# Patient Record
Sex: Male | Born: 1965 | Race: White | Hispanic: No | Marital: Married | Smoking: Never smoker
Health system: Southern US, Community
[De-identification: ages and names within clinical notes are randomized; demographics above are authoritative.]

## PROBLEM LIST (undated history)

## (undated) DIAGNOSIS — G4730 Sleep apnea, unspecified: Secondary | ICD-10-CM

## (undated) DIAGNOSIS — I10 Essential (primary) hypertension: Secondary | ICD-10-CM

## (undated) DIAGNOSIS — J45909 Unspecified asthma, uncomplicated: Secondary | ICD-10-CM

## (undated) DIAGNOSIS — I639 Cerebral infarction, unspecified: Secondary | ICD-10-CM

## (undated) DIAGNOSIS — G4733 Obstructive sleep apnea (adult) (pediatric): Secondary | ICD-10-CM

## (undated) DIAGNOSIS — R011 Cardiac murmur, unspecified: Secondary | ICD-10-CM

## (undated) HISTORY — DX: Unspecified asthma, uncomplicated: J45.909

## (undated) HISTORY — PX: OTHER SURGICAL HISTORY: SHX169

## (undated) HISTORY — DX: Sleep apnea, unspecified: G47.30

## (undated) HISTORY — DX: Essential (primary) hypertension: I10

## (undated) HISTORY — DX: Obstructive sleep apnea (adult) (pediatric): G47.33

## (undated) HISTORY — DX: Cardiac murmur, unspecified: R01.1

## (undated) HISTORY — DX: Cerebral infarction, unspecified: I63.9

---

## 2014-04-06 ENCOUNTER — Telehealth (HOSPITAL_COMMUNITY): Payer: Self-pay | Admitting: *Deleted

## 2014-05-18 ENCOUNTER — Telehealth (HOSPITAL_COMMUNITY): Payer: Self-pay | Admitting: *Deleted

## 2015-11-05 ENCOUNTER — Ambulatory Visit (INDEPENDENT_AMBULATORY_CARE_PROVIDER_SITE_OTHER): Payer: BLUE CROSS/BLUE SHIELD | Admitting: Family Medicine

## 2015-11-05 VITALS — BP 122/70 | HR 91 | Temp 99.0°F | Resp 16 | Ht 70.0 in | Wt 307.0 lb

## 2015-11-05 DIAGNOSIS — J45901 Unspecified asthma with (acute) exacerbation: Secondary | ICD-10-CM | POA: Diagnosis not present

## 2015-11-05 DIAGNOSIS — H66002 Acute suppurative otitis media without spontaneous rupture of ear drum, left ear: Secondary | ICD-10-CM

## 2015-11-05 MED ORDER — IPRATROPIUM BROMIDE 0.02 % IN SOLN
0.5000 mg | Freq: Once | RESPIRATORY_TRACT | Status: AC
  Administered 2015-11-05: 0.5 mg via RESPIRATORY_TRACT

## 2015-11-05 MED ORDER — HYDROCOD POLST-CPM POLST ER 10-8 MG/5ML PO SUER: 5.0000 mL | Freq: Two times a day (BID) | ORAL | Status: DC | PRN

## 2015-11-05 MED ORDER — ALBUTEROL SULFATE (2.5 MG/3ML) 0.083% IN NEBU
2.5000 mg | INHALATION_SOLUTION | Freq: Once | RESPIRATORY_TRACT | Status: AC
  Administered 2015-11-05: 2.5 mg via RESPIRATORY_TRACT

## 2015-11-05 MED ORDER — IPRATROPIUM-ALBUTEROL 20-100 MCG/ACT IN AERS: 1.0000 | INHALATION_SPRAY | Freq: Four times a day (QID) | RESPIRATORY_TRACT | Status: DC

## 2015-11-05 MED ORDER — AMOXICILLIN-POT CLAVULANATE 875-125 MG PO TABS
1.0000 | ORAL_TABLET | Freq: Two times a day (BID) | ORAL | Status: DC
Start: 2015-11-05 — End: 2016-09-07

## 2015-11-05 NOTE — Patient Instructions (Signed)
Hot showers or breathing in steam may help loosen the congestion.  Using a netti pot or sinus rinse is also likely to help you feel better and keep this from progressing.  Use the nasal saline spray as needed throughout the day for at least 2 weeks.  I recommend augmenting with generic mucinex to help you move out the congestion.  If no improvement or you are getting worse, come back as you might need a course of steroids but hopefully with all of the above, you can avoid it.  Bronchospasm, Adult A bronchospasm is a spasm or tightening of the airways going into the lungs. During a bronchospasm breathing becomes more difficult because the airways get smaller. When this happens there can be coughing, a whistling sound when breathing (wheezing), and difficulty breathing. Bronchospasm is often associated with asthma, but not all patients who experience a bronchospasm have asthma. CAUSES  A bronchospasm is caused by inflammation or irritation of the airways. The inflammation or irritation may be triggered by:   Allergies (such as to animals, pollen, food, or mold). Allergens that cause bronchospasm may cause wheezing immediately after exposure or many hours later.   Infection. Viral infections are believed to be the most common cause of bronchospasm.   Exercise.   Irritants (such as pollution, cigarette smoke, strong odors, aerosol sprays, and paint fumes).   Weather changes. Winds increase molds and pollens in the air. Rain refreshes the air by washing irritants out. Cold air may cause inflammation.   Stress and emotional upset.  SIGNS AND SYMPTOMS   Wheezing.   Excessive nighttime coughing.   Frequent or severe coughing with a simple cold.   Chest tightness.   Shortness of breath.  DIAGNOSIS  Bronchospasm is usually diagnosed through a history and physical exam. Tests, such as chest X-rays, are sometimes done to look for other conditions. TREATMENT   Inhaled medicines can be  given to open up your airways and help you breathe. The medicines can be given using either an inhaler or a nebulizer machine.  Corticosteroid medicines may be given for severe bronchospasm, usually when it is associated with asthma. HOME CARE INSTRUCTIONS   Always have a plan prepared for seeking medical care. Know when to call your health care provider and local emergency services (911 in the [place] Know where you can access local emergency care.  Only take medicines as directed by your health care provider.  If you were prescribed an inhaler or nebulizer machine, ask your health care provider to explain how to use it correctly. Always use a spacer with your inhaler if you were given one.  It is necessary to remain calm during an attack. Try to relax and breathe more slowly.  Control your home environment in the following ways:   Change your heating and air conditioning filter at least once a month.   Limit your use of fireplaces and wood stoves.  Do not smoke and do not allow smoking in your home.   Avoid exposure to perfumes and fragrances.   Get rid of pests (such as roaches and mice) and their droppings.   Throw away plants if you see mold on them.   Keep your house clean and dust free.   Replace carpet with wood, tile, or vinyl flooring. Carpet can trap dander and dust.   Use allergy-proof pillows, mattress covers, and box spring covers.   Wash bed sheets and blankets every week in hot water and dry them in a dryer.  Use blankets that are made of polyester or [place].   Wash hands frequently. SEEK MEDICAL CARE IF:   You have muscle aches.   You have chest pain.   The sputum changes from clear or white to yellow, green, gray, or bloody.   The sputum you cough up gets thicker.   There are problems that may be related to the medicine you are given, such as a rash, itching, swelling, or trouble breathing.  SEEK IMMEDIATE MEDICAL CARE IF:   You  have worsening wheezing and coughing even after taking your prescribed medicines.   You have increased difficulty breathing.   You develop severe chest pain. MAKE SURE YOU:   Understand these instructions.  Will watch your condition.  Will get help right away if you are not doing well or get worse.   This information is not intended to replace advice given to you by your health care provider. Make sure you discuss any questions you have with your health care provider.   Document Released: 10/26/2003 Document Revised: 11/13/2014 Document Reviewed: 04/14/2013 Elsevier Interactive Patient Education 2016 Elsevier Inc.  Otitis Media, Adult Otitis media is redness, soreness, and inflammation of the middle ear. Otitis media may be caused by allergies or, most commonly, by infection. Often it occurs as a complication of the common cold. SIGNS AND SYMPTOMS Symptoms of otitis media may include:  Earache.  Fever.  Ringing in your ear.  Headache.  Leakage of fluid from the ear. DIAGNOSIS To diagnose otitis media, your health care provider will examine your ear with an otoscope. This is an instrument that allows your health care provider to see into your ear in order to examine your eardrum. Your health care provider also will ask you questions about your symptoms. TREATMENT  Typically, otitis media resolves on its own within 3-5 days. Your health care provider may prescribe medicine to ease your symptoms of pain. If otitis media does not resolve within 5 days or is recurrent, your health care provider may prescribe antibiotic medicines if he or she suspects that a bacterial infection is the cause. HOME CARE INSTRUCTIONS   If you were prescribed an antibiotic medicine, finish it all even if you start to feel better.  Take medicines only as directed by your health care provider.  Keep all follow-up visits as directed by your health care provider. SEEK MEDICAL CARE IF:  You have  otitis media only in one ear, or bleeding from your nose, or both.  You notice a lump on your neck.  You are not getting better in 3-5 days.  You feel worse instead of better. SEEK IMMEDIATE MEDICAL CARE IF:   You have pain that is not controlled with medicine.  You have swelling, redness, or pain around your ear or stiffness in your neck.  You notice that part of your face is paralyzed.  You notice that the bone behind your ear (mastoid) is tender when you touch it. MAKE SURE YOU:   Understand these instructions.  Will watch your condition.  Will get help right away if you are not doing well or get worse.   This information is not intended to replace advice given to you by your health care provider. Make sure you discuss any questions you have with your health care provider.   Document Released: 07/28/2004 Document Revised: 11/13/2014 Document Reviewed: 05/20/2013 Elsevier Interactive Patient Education [employer].

## 2015-11-05 NOTE — Progress Notes (Signed)
Subjective:    Patient ID: William Lowery, male    DOB: January 26, 1966, 49 y.o.   MRN: [id number] By signing my name below, I, William Lowery, attest that this documentation has been prepared under the direction and in the presence of Delman Cheadle, MD.  Electronically Signed: Zola Lowery, Medical Scribe. 11/05/2015. 4:44 PM.  Chief Complaint  Patient presents with  . URI    HPI HPI Comments: William Lowery is a 49 y.o. male with a history of asthma who presents to the [facility] complaining of gradual onset sore throat that started 3-4 days ago. Patient reports having associated nasal congestion, cough, sinus pressure, and chest tightness. He denies fever, chills, and ear pain. He also denies recent antibiotic use. Patient reports using smokeless tobacco. He has not had any asthma issues since high school. No known allergies to antibiotics. He has never been on a course of steroids.  No past medical history on file. No current outpatient prescriptions on file prior to visit.   No current facility-administered medications on file prior to visit.   No Known Allergies  Review of Systems  Constitutional: Positive for activity change. Negative for fever, chills and appetite change.  HENT: Positive for congestion, postnasal drip, rhinorrhea, sinus pressure and sore throat. Negative for ear discharge, ear pain, trouble swallowing and voice change.   Respiratory: Positive for cough and chest tightness. Negative for shortness of breath and wheezing.   Cardiovascular: Negative for chest pain and palpitations.  Gastrointestinal: Negative for vomiting, abdominal pain and diarrhea.  Genitourinary: Negative for dysuria and decreased urine volume.  Neurological: Negative for light-headedness.  Psychiatric/Behavioral: Positive for sleep disturbance.       Objective:  BP 122/70 mmHg  Pulse 91  Temp(Src) 99 F (37.2 C) (Oral)  Resp 16  Ht 5\' 10"  (1.778 m)  Wt 307 lb (139.254 kg)  BMI  44.05 kg/m2  SpO2 97%  Physical Exam  Constitutional: He is oriented to person, place, and time. He appears well-developed and well-nourished. No distress.  HENT:  Head: Normocephalic and atraumatic.  Right Ear: Tympanic membrane normal.  Left Ear: Tympanic membrane is erythematous and bulging.  Mouth/Throat: Oropharynx is clear and moist. No oropharyngeal exudate.  Nasal edema and erythema. Bright red oropharyngeal erythema and edema, 3+ tonsils bilaterally, no exudates.   Eyes: Pupils are equal, round, and reactive to light.  Neck: Neck supple. No thyromegaly present.  Normal thyroid.  Cardiovascular: Normal rate and regular rhythm.   Murmur heard.  Systolic murmur is present with a grade of 3/6  Pulmonary/Chest: Effort normal. He has wheezes.  Faint inspiratory and expiratory wheezing throughout all lung fields.  Musculoskeletal: He exhibits no edema.  Lymphadenopathy:       Head (right side): Tonsillar adenopathy present.       Head (left side): Tonsillar adenopathy present.  Tonsillar adenopathy, left greater than right.  Neurological: He is alert and oriented to person, place, and time. No cranial nerve deficit.  Skin: Skin is warm. No rash noted.  Clammy to the touch.  Psychiatric: He has a normal mood and affect. His behavior is normal.  Nursing note and vitals reviewed.         Assessment & Plan:   1. Acute suppurative otitis media of left ear without spontaneous rupture of tympanic membrane, recurrence not specified   2. Reactive airway disease with wheezing with acute exacerbation     Meds ordered this encounter  Medications  . losartan (COZAAR) 25  MG tablet    Sig: Take 25 mg by mouth daily.  Marland Kitchen albuterol (PROVENTIL) (2.5 MG/3ML) 0.083% nebulizer solution 2.5 mg    Sig:   . ipratropium (ATROVENT) nebulizer solution 0.5 mg    Sig:   . amoxicillin-clavulanate (AUGMENTIN) 875-125 MG tablet    Sig: Take 1 tablet by mouth 2 (two) times daily.    Dispense:  20  tablet    Refill:  0  . chlorpheniramine-HYDROcodone (TUSSIONEX PENNKINETIC ER) 10-8 MG/5ML SUER    Sig: Take 5 mLs by mouth every 12 (twelve) hours as needed.    Dispense:  90 mL    Refill:  0  . Ipratropium-Albuterol (COMBIVENT RESPIMAT) 20-100 MCG/ACT AERS respimat    Sig: Inhale 1 puff into the lungs 4 (four) times daily.    Dispense:  4 g    Refill:  1    I personally performed the services described in this documentation, which was scribed in my presence. The recorded information has been reviewed and considered, and addended by me as needed.  Delman Cheadle, MD MPH

## 2016-09-06 ENCOUNTER — Ambulatory Visit: Payer: BLUE CROSS/BLUE SHIELD

## 2016-09-07 ENCOUNTER — Ambulatory Visit (INDEPENDENT_AMBULATORY_CARE_PROVIDER_SITE_OTHER): Payer: BLUE CROSS/BLUE SHIELD

## 2016-09-07 ENCOUNTER — Ambulatory Visit (INDEPENDENT_AMBULATORY_CARE_PROVIDER_SITE_OTHER): Payer: BLUE CROSS/BLUE SHIELD | Admitting: Family Medicine

## 2016-09-07 VITALS — BP 130/88 | Temp 99.1°F

## 2016-09-07 DIAGNOSIS — R059 Cough, unspecified: Secondary | ICD-10-CM

## 2016-09-07 DIAGNOSIS — R05 Cough: Secondary | ICD-10-CM

## 2016-09-07 DIAGNOSIS — J069 Acute upper respiratory infection, unspecified: Secondary | ICD-10-CM | POA: Diagnosis not present

## 2016-09-07 DIAGNOSIS — H6981 Other specified disorders of Eustachian tube, right ear: Secondary | ICD-10-CM | POA: Diagnosis not present

## 2016-09-07 MED ORDER — AZITHROMYCIN 250 MG PO TABS: ORAL_TABLET | ORAL | 0 refills | Status: DC

## 2016-09-07 MED ORDER — ALBUTEROL SULFATE HFA 108 (90 BASE) MCG/ACT IN AERS: 2.0000 | INHALATION_SPRAY | Freq: Four times a day (QID) | RESPIRATORY_TRACT | 0 refills | Status: DC | PRN

## 2016-09-07 NOTE — Progress Notes (Signed)
   William Lowery is a 50 y.o. male who presents to [facility] today for cough:  1.  Cough:  Present for past week or so.  Preceded by URI symptoms, including rhinorrhea, right ear pain, sinus congestion, moderate cough. Patient states cough keeps him awake at night. He has tried over-the-counter NyQuil with some relief of cough.  Subjective fevers at night as well.  No sick contacts.  Does have history of asthma, for which he doesn't take any medications/use any inhalers.  Some right ear pain for past several days.  No drainage.    ROS as above.    PMH reviewed. Patient is a nonsmoker.   No past medical history on file. No past surgical history on file.  Medications reviewed. Current Outpatient Prescriptions  Medication Sig Dispense Refill  . losartan (COZAAR) 25 MG tablet Take 25 mg by mouth daily.     No current facility-administered medications for this visit.      Physical Exam:  There were no vitals taken for this visit. Gen:  Patient sitting on exam table, appears stated age in no acute distress Head: Normocephalic atraumatic Eyes: EOMI, PERRL, sclera and conjunctiva non-erythematous Ears:  Canals clear bilaterally.  TM normal on Left.  Right TM with some mild erythema noted.  Nose:  Nasal turbinates grossly enlarged bilaterally. Some exudates noted. Tender to palpation of maxillary sinus  Mouth: Mucosa membranes moist. Tonsils +2, nonenlarged, non-erythematous. Neck: No cervical lymphadenopathy noted Heart:  RRR, Grade III/VI SEM noted throughout.   Pulm:  Persistent Right lower lobe wheezing that doesn't clear with cough.   Assessment and Plan:  1.  URI  - negative CXR here.  Wheezing more likely asthma, see below  - plan treat with azithromycin   2. Asthma exacerbation:  - untreated.  I have sent in albuterol inhaler for him.  - No need for prednisone currently.    3.  Right eustachian tube dysfunction: - likely better with treatment.  Can  attempt OTC decongestant if no improvement.

## 2016-09-07 NOTE — Patient Instructions (Addendum)
Your chest xray looked good.  There is no evidence of pneumonia.  Take the azithromycin 2 pills today and 1 pill daily after that.  This should get you cleared up.  This should also clear your ear.    You can try an over the counter decongestant to help relieve the pressure in your ear in the next few days if the antibiotic is not working.   It was good to see you today   Upper Respiratory Infection, Adult Most upper respiratory infections (URIs) are a viral infection of the air passages leading to the lungs. A URI affects the nose, throat, and upper air passages. The most common type of URI is nasopharyngitis and is typically referred to as "the common cold." URIs run their course and usually go away on their own. Most of the time, a URI does not require medical attention, but sometimes a bacterial infection in the upper airways can follow a viral infection. This is called a secondary infection. Sinus and middle ear infections are common types of secondary upper respiratory infections. Bacterial pneumonia can also complicate a URI. A URI can worsen asthma and chronic obstructive pulmonary disease (COPD). Sometimes, these complications can require emergency medical care and may be life threatening.  CAUSES Almost all URIs are caused by viruses. A virus is a type of germ and can spread from one person to another.  RISKS FACTORS You may be at risk for a URI if:   You smoke.   You have chronic heart or lung disease.  You have a weakened defense (immune) system.   You are very young or very old.   You have nasal allergies or asthma.  You work in crowded or poorly ventilated areas.  You work in health care facilities or schools. SIGNS AND SYMPTOMS  Symptoms typically develop 2-3 days after you come in contact with a cold virus. Most viral URIs last 7-10 days. However, viral URIs from the influenza virus (flu virus) can last 14-18 days and are typically more severe. Symptoms may  include:   Runny or stuffy (congested) nose.   Sneezing.   Cough.   Sore throat.   Headache.   Fatigue.   Fever.   Loss of appetite.   Pain in your forehead, behind your eyes, and over your cheekbones (sinus pain).  Muscle aches.  DIAGNOSIS  Your health care provider may diagnose a URI by:  Physical exam.  Tests to check that your symptoms are not due to another condition such as:  Strep throat.  Sinusitis.  Pneumonia.  Asthma. TREATMENT  A URI goes away on its own with time. It cannot be cured with medicines, but medicines may be prescribed or recommended to relieve symptoms. Medicines may help:  Reduce your fever.  Reduce your cough.  Relieve nasal congestion. HOME CARE INSTRUCTIONS   Take medicines only as directed by your health care provider.   Gargle warm saltwater or take cough drops to comfort your throat as directed by your health care provider.  Use a warm mist humidifier or inhale steam from a shower to increase air moisture. This may make it easier to breathe.  Drink enough fluid to keep your urine clear or pale yellow.   Eat soups and other clear broths and maintain good nutrition.   Rest as needed.   Return to work when your temperature has returned to normal or as your health care provider advises. You may need to stay home longer to avoid infecting others. You  can also use a face mask and careful hand washing to prevent spread of the virus.  Increase the usage of your inhaler if you have asthma.   Do not use any tobacco products, including cigarettes, chewing tobacco, or electronic cigarettes. If you need help quitting, ask your health care provider. PREVENTION  The best way to protect yourself from getting a cold is to practice good hygiene.   Avoid oral or hand contact with people with cold symptoms.   Wash your hands often if contact occurs.  There is no clear evidence that vitamin C, vitamin E, echinacea, or  exercise reduces the chance of developing a cold. However, it is always recommended to get plenty of rest, exercise, and practice good nutrition.  SEEK MEDICAL CARE IF:   You are getting worse rather than better.   Your symptoms are not controlled by medicine.   You have chills.  You have worsening shortness of breath.  You have brown or red mucus.  You have yellow or brown nasal discharge.  You have pain in your face, especially when you bend forward.  You have a fever.  You have swollen neck glands.  You have pain while swallowing.  You have white areas in the back of your throat. SEEK IMMEDIATE MEDICAL CARE IF:   You have severe or persistent:  Headache.  Ear pain.  Sinus pain.  Chest pain.  You have chronic lung disease and any of the following:  Wheezing.  Prolonged cough.  Coughing up blood.  A change in your usual mucus.  You have a stiff neck.  You have changes in your:  Vision.  Hearing.  Thinking.  Mood. MAKE SURE YOU:   Understand these instructions.  Will watch your condition.  Will get help right away if you are not doing well or get worse.   This information is not intended to replace advice given to you by your health care provider. Make sure you discuss any questions you have with your health care provider.   Document Released: 04/18/2001 Document Revised: 03/09/2015 Document Reviewed: 01/28/2014 Elsevier Interactive Patient Education 2016 [employer].    IF you received an x-ray today, you will receive an invoice from [facility]. Please contact [facility] at [phone] with questions or concerns regarding your invoice.   IF you received labwork today, you will receive an invoice from [employer]. Please contact [facility] at [phone] with questions or concerns regarding your invoice.   Our billing staff will not be able to assist you with questions regarding bills from  these companies.  You will be contacted with the lab results as soon as they are available. The fastest way to get your results is to activate your My Chart account. Instructions are located on the last page of this paperwork. If you have not heard from [place] regarding the results in 2 weeks, please contact this office.

## 2017-03-16 ENCOUNTER — Encounter: Payer: Self-pay | Admitting: Gastroenterology

## 2017-05-07 ENCOUNTER — Encounter: Payer: Self-pay | Admitting: Gastroenterology

## 2017-05-07 ENCOUNTER — Ambulatory Visit (AMBULATORY_SURGERY_CENTER): Payer: Self-pay | Admitting: *Deleted

## 2017-05-07 VITALS — Ht 71.0 in | Wt 309.0 lb

## 2017-05-07 DIAGNOSIS — Z1211 Encounter for screening for malignant neoplasm of colon: Secondary | ICD-10-CM

## 2017-05-07 MED ORDER — NA SULFATE-K SULFATE-MG SULF 17.5-3.13-1.6 GM/177ML PO SOLN: ORAL | 0 refills | Status: DC

## 2017-05-07 NOTE — Progress Notes (Signed)
Airway assessment done by B Silvio Clayman CRNA- ok for [facility]  No egg or soy allergy  No home oxygen used  Registered in EMMI  No diet medications taken

## 2017-05-21 ENCOUNTER — Encounter: Payer: Self-pay | Admitting: Gastroenterology

## 2017-05-21 ENCOUNTER — Ambulatory Visit (AMBULATORY_SURGERY_CENTER): Payer: PRIVATE HEALTH INSURANCE | Admitting: Gastroenterology

## 2017-05-21 VITALS — BP 115/77 | HR 87 | Temp 99.6°F | Resp 11 | Ht 71.0 in | Wt 309.0 lb

## 2017-05-21 DIAGNOSIS — Z1211 Encounter for screening for malignant neoplasm of colon: Secondary | ICD-10-CM | POA: Diagnosis present

## 2017-05-21 DIAGNOSIS — K5730 Diverticulosis of large intestine without perforation or abscess without bleeding: Secondary | ICD-10-CM

## 2017-05-21 DIAGNOSIS — Z1212 Encounter for screening for malignant neoplasm of rectum: Secondary | ICD-10-CM | POA: Diagnosis not present

## 2017-05-21 DIAGNOSIS — D123 Benign neoplasm of transverse colon: Secondary | ICD-10-CM | POA: Diagnosis not present

## 2017-05-21 DIAGNOSIS — D129 Benign neoplasm of anus and anal canal: Secondary | ICD-10-CM

## 2017-05-21 DIAGNOSIS — D128 Benign neoplasm of rectum: Secondary | ICD-10-CM

## 2017-05-21 MED ORDER — SODIUM CHLORIDE 0.9 % IV SOLN: 500.0000 mL | INTRAVENOUS | Status: DC

## 2017-05-21 NOTE — Patient Instructions (Signed)
Impression/Recommendations:  Polyp handout given to patient. Diverticulosis handout given to patient.  Resume previous diet. Continue present medications.  Repeat colonoscopy in 5-10 years based on pathology report.  YOU HAD AN ENDOSCOPIC PROCEDURE TODAY AT [facility]:   Refer to the procedure report that was given to you for any specific questions about what was found during the examination.  If the procedure report does not answer your questions, please call your gastroenterologist to clarify.  If you requested that your care partner not be given the details of your procedure findings, then the procedure report has been included in a sealed envelope for you to review at your convenience later.  YOU SHOULD EXPECT: Some feelings of bloating in the abdomen. Passage of more gas than usual.  Walking can help get rid of the air that was put into your GI tract during the procedure and reduce the bloating. If you had a lower endoscopy (such as a colonoscopy or flexible sigmoidoscopy) you may notice spotting of blood in your stool or on the toilet paper. If you underwent a bowel prep for your procedure, you may not have a normal bowel movement for a few days.  Please Note:  You might notice some irritation and congestion in your nose or some drainage.  This is from the oxygen used during your procedure.  There is no need for concern and it should clear up in a day or so.  SYMPTOMS TO REPORT IMMEDIATELY:   Following lower endoscopy (colonoscopy or flexible sigmoidoscopy):  Excessive amounts of blood in the stool  Significant tenderness or worsening of abdominal pains  Swelling of the abdomen that is new, acute  Fever of 100F or higher For urgent or emergent issues, a gastroenterologist can be reached at any hour by calling [phone].   DIET:  We do recommend a small meal at first, but then you may proceed to your regular diet.  Drink plenty of fluids but you should avoid  alcoholic beverages for 24 hours.  ACTIVITY:  You should plan to take it easy for the rest of today and you should NOT DRIVE or use heavy machinery until tomorrow (because of the sedation medicines used during the test).    FOLLOW UP: Our staff will call the number listed on your records the next business day following your procedure to check on you and address any questions or concerns that you may have regarding the information given to you following your procedure. If we do not reach you, we will leave a message.  However, if you are feeling well and you are not experiencing any problems, there is no need to return our call.  We will assume that you have returned to your regular daily activities without incident.  If any biopsies were taken you will be contacted by phone or by letter within the next 1-3 weeks.  Please call us at [phone] if you have not heard about the biopsies in 3 weeks.    SIGNATURES/CONFIDENTIALITY: You and/or your care partner have signed paperwork which will be entered into your electronic medical record.  These signatures attest to the fact that that the information above on your After Visit Summary has been reviewed and is understood.  Full responsibility of the confidentiality of this discharge information lies with you and/or your care-partner.

## 2017-05-21 NOTE — Progress Notes (Signed)
A/ox3, pleased with MAC, report to RN 

## 2017-05-21 NOTE — Op Note (Signed)
[facility] Patient Name: William Lowery Procedure Date: 05/21/2017 9:52 AM MRN: [id number] Endoscopist: Milus Banister , MD Age: 51 Referring MD:  Date of Birth: [birth year] Gender: Male Account #: [id number] Procedure:                Colonoscopy Indications:              Screening for colorectal malignant neoplasm Medicines:                Monitored Anesthesia Care Procedure:                Pre-Anesthesia Assessment:                           - Prior to the procedure, a History and Physical                            was performed, and patient medications and                            allergies were reviewed. The patient's tolerance of                            previous anesthesia was also reviewed. The risks                            and benefits of the procedure and the sedation                            options and risks were discussed with the patient.                            All questions were answered, and informed consent                            was obtained. Prior Anticoagulants: The patient has                            taken no previous anticoagulant or antiplatelet                            agents. ASA Grade Assessment: II - A patient with                            mild systemic disease. After reviewing the risks                            and benefits, the patient was deemed in                            satisfactory condition to undergo the procedure.                           After obtaining informed consent, the colonoscope  was passed under direct vision. Throughout the                            procedure, the patient's blood pressure, pulse, and                            oxygen saturations were monitored continuously. The                            Colonoscope was introduced through the anus and                            advanced to the the cecum, identified by                            appendiceal orifice and  ileocecal valve. The                            colonoscopy was performed without difficulty. The                            patient tolerated the procedure well. The quality                            of the bowel preparation was good. The ileocecal                            valve, appendiceal orifice, and rectum were                            photographed. Scope In: 9:56:23 AM Scope Out: 10:05:53 AM Scope Withdrawal Time: 0 hours 7 minutes 56 seconds  Total Procedure Duration: 0 hours 9 minutes 30 seconds  Findings:                 Two sessile polyps were found in the rectum and                            transverse colon. The polyps were 3 to 4 mm in                            size. These polyps were removed with a cold snare.                            Resection and retrieval were complete.                           Multiple small and large-mouthed diverticula were                            found in the left colon.                           The exam was otherwise without abnormality on  direct and retroflexion views. Complications:            No immediate complications. Estimated blood loss:                            None. Estimated Blood Loss:     Estimated blood loss: none. Impression:               - Two 3 to 4 mm polyps in the rectum and in the                            transverse colon, removed with a cold snare.                            Resected and retrieved.                           - Diverticulosis in the left colon.                           - The examination was otherwise normal on direct                            and retroflexion views. Recommendation:           - Patient has a contact number available for                            emergencies. The signs and symptoms of potential                            delayed complications were discussed with the                            patient. Return to normal activities tomorrow.                             Written discharge instructions were provided to the                            patient.                           - Resume previous diet.                           - Continue present medications.                           You will receive a letter within 2-3 weeks with the                            pathology results and my final recommendations.                           If the polyp(s) is proven to be 'pre-cancerous' on  pathology, you will need repeat colonoscopy in 5                            years. If the polyp(s) is NOT 'precancerous' on                            pathology then you should repeat colon cancer                            screening in 10 years with colonoscopy without need                            for colon cancer screening by any method prior to                            then (including stool testing). Milus Banister, MD 05/21/2017 10:08:09 AM This report has been signed electronically.

## 2017-05-21 NOTE — Progress Notes (Signed)
Pt's states no medical or surgical changes since previsit or office visit. 

## 2017-05-21 NOTE — Progress Notes (Signed)
Called to room to assist during endoscopic procedure.  Patient ID and intended procedure confirmed with present staff. Received instructions for my participation in the procedure from the performing physician.  

## 2017-05-22 ENCOUNTER — Telehealth: Payer: Self-pay | Admitting: *Deleted

## 2017-05-22 NOTE — Telephone Encounter (Signed)
  Follow up Call-  Call back number 05/21/2017  Post procedure Call Back phone  # [phone]  Permission to leave phone message Yes  Some recent data might be hidden     Patient questions:  Do you have a fever, pain , or abdominal swelling? No. Pain Score  0 *  Have you tolerated food without any problems? Yes.    Have you been able to return to your normal activities? Yes.    Do you have any questions about your discharge instructions: Diet   No. Medications  No. Follow up visit  No.  Do you have questions or concerns about your Care? No.  Actions: * If pain score is 4 or above: No action needed, pain <4.

## 2017-05-22 NOTE — Telephone Encounter (Signed)
Left message on f/u call 

## 2017-05-24 ENCOUNTER — Encounter: Payer: Self-pay | Admitting: Gastroenterology

## 2017-12-01 ENCOUNTER — Inpatient Hospital Stay (HOSPITAL_COMMUNITY): Payer: Self-pay | Admitting: Anesthesiology

## 2017-12-01 ENCOUNTER — Inpatient Hospital Stay (HOSPITAL_COMMUNITY): Payer: Self-pay

## 2017-12-01 ENCOUNTER — Inpatient Hospital Stay (HOSPITAL_COMMUNITY)
Admission: EM | Admit: 2017-12-01 | Discharge: 2017-12-10 | DRG: 023 | Disposition: A | Discharge status: Rehabilitation Facility | Payer: PRIVATE HEALTH INSURANCE | Attending: Neurology | Admitting: Neurology

## 2017-12-01 ENCOUNTER — Emergency Department (HOSPITAL_COMMUNITY): Payer: Self-pay

## 2017-12-01 ENCOUNTER — Encounter (HOSPITAL_COMMUNITY): Payer: Self-pay | Admitting: Radiology

## 2017-12-01 ENCOUNTER — Encounter (HOSPITAL_COMMUNITY)
Admission: EM | Disposition: A | Discharge status: Other Acute Inpatient Hospital | Payer: Self-pay | Source: Home / Self Care | Attending: Neurology

## 2017-12-01 DIAGNOSIS — J9811 Atelectasis: Secondary | ICD-10-CM | POA: Diagnosis present

## 2017-12-01 DIAGNOSIS — I712 Thoracic aortic aneurysm, without rupture: Secondary | ICD-10-CM | POA: Diagnosis present

## 2017-12-01 DIAGNOSIS — J9602 Acute respiratory failure with hypercapnia: Secondary | ICD-10-CM | POA: Diagnosis not present

## 2017-12-01 DIAGNOSIS — G4733 Obstructive sleep apnea (adult) (pediatric): Secondary | ICD-10-CM

## 2017-12-01 DIAGNOSIS — J45909 Unspecified asthma, uncomplicated: Secondary | ICD-10-CM | POA: Diagnosis present

## 2017-12-01 DIAGNOSIS — E876 Hypokalemia: Secondary | ICD-10-CM | POA: Diagnosis not present

## 2017-12-01 DIAGNOSIS — D72829 Elevated white blood cell count, unspecified: Secondary | ICD-10-CM | POA: Diagnosis present

## 2017-12-01 DIAGNOSIS — Z01818 Encounter for other preprocedural examination: Secondary | ICD-10-CM

## 2017-12-01 DIAGNOSIS — I63532 Cerebral infarction due to unspecified occlusion or stenosis of left posterior cerebral artery: Principal | ICD-10-CM | POA: Diagnosis present

## 2017-12-01 DIAGNOSIS — Z7982 Long term (current) use of aspirin: Secondary | ICD-10-CM

## 2017-12-01 DIAGNOSIS — I63332 Cerebral infarction due to thrombosis of left posterior cerebral artery: Secondary | ICD-10-CM

## 2017-12-01 DIAGNOSIS — I639 Cerebral infarction, unspecified: Secondary | ICD-10-CM | POA: Diagnosis not present

## 2017-12-01 DIAGNOSIS — R739 Hyperglycemia, unspecified: Secondary | ICD-10-CM | POA: Diagnosis present

## 2017-12-01 DIAGNOSIS — E872 Acidosis: Secondary | ICD-10-CM | POA: Diagnosis present

## 2017-12-01 DIAGNOSIS — I69351 Hemiplegia and hemiparesis following cerebral infarction affecting right dominant side: Secondary | ICD-10-CM

## 2017-12-01 DIAGNOSIS — E041 Nontoxic single thyroid nodule: Secondary | ICD-10-CM | POA: Diagnosis present

## 2017-12-01 DIAGNOSIS — E785 Hyperlipidemia, unspecified: Secondary | ICD-10-CM | POA: Diagnosis present

## 2017-12-01 DIAGNOSIS — I63539 Cerebral infarction due to unspecified occlusion or stenosis of unspecified posterior cerebral artery: Secondary | ICD-10-CM | POA: Diagnosis present

## 2017-12-01 DIAGNOSIS — I480 Paroxysmal atrial fibrillation: Secondary | ICD-10-CM | POA: Diagnosis present

## 2017-12-01 DIAGNOSIS — H53461 Homonymous bilateral field defects, right side: Secondary | ICD-10-CM | POA: Diagnosis present

## 2017-12-01 DIAGNOSIS — R1310 Dysphagia, unspecified: Secondary | ICD-10-CM | POA: Diagnosis present

## 2017-12-01 DIAGNOSIS — A0472 Enterocolitis due to Clostridium difficile, not specified as recurrent: Secondary | ICD-10-CM

## 2017-12-01 DIAGNOSIS — R29703 NIHSS score 3: Secondary | ICD-10-CM | POA: Diagnosis present

## 2017-12-01 DIAGNOSIS — Z7902 Long term (current) use of antithrombotics/antiplatelets: Secondary | ICD-10-CM

## 2017-12-01 DIAGNOSIS — I10 Essential (primary) hypertension: Secondary | ICD-10-CM

## 2017-12-01 DIAGNOSIS — J9690 Respiratory failure, unspecified, unspecified whether with hypoxia or hypercapnia: Secondary | ICD-10-CM

## 2017-12-01 HISTORY — PX: IR ANGIO VERTEBRAL SEL VERTEBRAL UNI R MOD SED: IMG5368

## 2017-12-01 HISTORY — PX: IR CT HEAD LTD: IMG2386

## 2017-12-01 HISTORY — PX: RADIOLOGY WITH ANESTHESIA: SHX6223

## 2017-12-01 HISTORY — PX: IR PERCUTANEOUS ART THROMBECTOMY/INFUSION INTRACRANIAL INC DIAG ANGIO: IMG6087

## 2017-12-01 HISTORY — PX: IR ANGIO INTRA EXTRACRAN SEL COM CAROTID INNOMINATE BILAT MOD SED: IMG5360

## 2017-12-01 HISTORY — PX: IR INTRA CRAN STENT: IMG2345

## 2017-12-01 LAB — I-STAT CHEM 8, ED
BUN: 22 mg/dL — ABNORMAL HIGH (ref 6–20)
Calcium, Ion: 1.18 mmol/L (ref 1.15–1.40)
Chloride: 98 mmol/L — ABNORMAL LOW (ref 101–111)
Creatinine, Ser: 1 mg/dL (ref 0.61–1.24)
Glucose, Bld: 163 mg/dL — ABNORMAL HIGH (ref 65–99)
HCT: 51 % (ref 39.0–52.0)
Hemoglobin: 17.3 g/dL — ABNORMAL HIGH (ref 13.0–17.0)
Potassium: 3.6 mmol/L (ref 3.5–5.1)
Sodium: 142 mmol/L (ref 135–145)
TCO2: 30 mmol/L (ref 22–32)

## 2017-12-01 LAB — DIFFERENTIAL
Basophils Absolute: 0 10*3/uL (ref 0.0–0.1)
Basophils Relative: 0 %
Eosinophils Absolute: 0.2 10*3/uL (ref 0.0–0.7)
Eosinophils Relative: 2 %
Lymphocytes Relative: 28 %
Lymphs Abs: 2.9 10*3/uL (ref 0.7–4.0)
Monocytes Absolute: 0.4 10*3/uL (ref 0.1–1.0)
Monocytes Relative: 4 %
Neutro Abs: 7 10*3/uL (ref 1.7–7.7)
Neutrophils Relative %: 66 %

## 2017-12-01 LAB — CBC
HCT: 48.8 % (ref 39.0–52.0)
Hemoglobin: 17.1 g/dL — ABNORMAL HIGH (ref 13.0–17.0)
MCH: 31.1 pg (ref 26.0–34.0)
MCHC: 35 g/dL (ref 30.0–36.0)
MCV: 88.7 fL (ref 78.0–100.0)
Platelets: 243 10*3/uL (ref 150–400)
RBC: 5.5 MIL/uL (ref 4.22–5.81)
RDW: 12.9 % (ref 11.5–15.5)
WBC: 10.5 10*3/uL (ref 4.0–10.5)

## 2017-12-01 LAB — COMPREHENSIVE METABOLIC PANEL
ALT: 51 U/L (ref 17–63)
AST: 39 U/L (ref 15–41)
Albumin: 3.9 g/dL (ref 3.5–5.0)
Alkaline Phosphatase: 68 U/L (ref 38–126)
Anion gap: 13 (ref 5–15)
BUN: 18 mg/dL (ref 6–20)
CO2: 26 mmol/L (ref 22–32)
Calcium: 9 mg/dL (ref 8.9–10.3)
Chloride: 100 mmol/L — ABNORMAL LOW (ref 101–111)
Creatinine, Ser: 1.08 mg/dL (ref 0.61–1.24)
GFR calc Af Amer: >60 mL/min (ref >60)
GFR calc non Af Amer: >60 mL/min (ref >60)
Glucose, Bld: 161 mg/dL — ABNORMAL HIGH (ref 65–99)
Potassium: 3.4 mmol/L — ABNORMAL LOW (ref 3.5–5.1)
Sodium: 139 mmol/L (ref 135–145)
Total Bilirubin: 2.1 mg/dL — ABNORMAL HIGH (ref 0.3–1.2)
Total Protein: 7 g/dL (ref 6.5–8.1)

## 2017-12-01 LAB — PROTIME-INR
INR: 0.92
Prothrombin Time: 12.2 seconds (ref 11.4–15.2)

## 2017-12-01 LAB — BLOOD GAS, ARTERIAL
Acid-base deficit: 1.5 mmol/L (ref 0.0–2.0)
Bicarbonate: 24 mmol/L (ref 20.0–28.0)
DRAWN BY: 28340
FIO2: 60
MECHVT: 600 mL
O2 Saturation: 98.9 %
PEEP: 5 cmH2O
Patient temperature: 98.6
RATE: 16 resp/min
pCO2 arterial: 50.3 mmHg — ABNORMAL HIGH (ref 32.0–48.0)
pH, Arterial: 7.301 — ABNORMAL LOW (ref 7.350–7.450)
pO2, Arterial: 164 mmHg — ABNORMAL HIGH (ref 83.0–108.0)

## 2017-12-01 LAB — APTT: aPTT: 27 seconds (ref 24–36)

## 2017-12-01 LAB — I-STAT TROPONIN, ED: Troponin i, poc: 0.01 ng/mL (ref 0.00–0.08)

## 2017-12-01 SURGERY — RADIOLOGY WITH ANESTHESIA
Anesthesia: General

## 2017-12-01 MED ORDER — ASPIRIN 81 MG PO CHEW
CHEWABLE_TABLET | ORAL | Status: AC
  Filled 2017-12-01: qty 1

## 2017-12-01 MED ORDER — FENTANYL CITRATE (PF) 100 MCG/2ML IJ SOLN: 50.0000 ug | Freq: Once | INTRAMUSCULAR | Status: DC

## 2017-12-01 MED ORDER — FENTANYL CITRATE (PF) 100 MCG/2ML IJ SOLN
INTRAMUSCULAR | Status: AC
  Filled 2017-12-01: qty 4

## 2017-12-01 MED ORDER — IOPAMIDOL (ISOVUE-300) INJECTION 61%
INTRAVENOUS | Status: AC
  Administered 2017-12-01: 70 mL
  Filled 2017-12-01: qty 150

## 2017-12-01 MED ORDER — FENTANYL CITRATE (PF) 100 MCG/2ML IJ SOLN
INTRAMUSCULAR | Status: DC | PRN
  Administered 2017-12-01: 25 ug via INTRAVENOUS
  Administered 2017-12-01: 150 ug via INTRAVENOUS
  Administered 2017-12-01: 50 ug via INTRAVENOUS
  Administered 2017-12-01: 25 ug via INTRAVENOUS
  Administered 2017-12-01: 50 ug via INTRAVENOUS

## 2017-12-01 MED ORDER — ONDANSETRON HCL 4 MG/2ML IJ SOLN
INTRAMUSCULAR | Status: DC | PRN
  Administered 2017-12-01: 4 mg via INTRAVENOUS

## 2017-12-01 MED ORDER — LACTATED RINGERS IV SOLN
INTRAVENOUS | Status: DC | PRN
  Administered 2017-12-01 (×2): via INTRAVENOUS

## 2017-12-01 MED ORDER — CHLORHEXIDINE GLUCONATE 0.12% ORAL RINSE (MEDLINE KIT)
15.0000 mL | Freq: Two times a day (BID) | OROMUCOSAL | Status: DC
  Administered 2017-12-01 – 2017-12-03 (×4): 15 mL via OROMUCOSAL

## 2017-12-01 MED ORDER — ACETAMINOPHEN 650 MG RE SUPP: 650.0000 mg | RECTAL | Status: DC | PRN

## 2017-12-01 MED ORDER — ACETAMINOPHEN 325 MG PO TABS
650.0000 mg | ORAL_TABLET | ORAL | Status: DC | PRN
  Administered 2017-12-05: 650 mg via ORAL
  Filled 2017-12-01: qty 2

## 2017-12-01 MED ORDER — PROPOFOL 1000 MG/100ML IV EMUL
0.0000 ug/kg/min | INTRAVENOUS | Status: DC
  Administered 2017-12-01 (×2): 50 ug/kg/min via INTRAVENOUS
  Administered 2017-12-02: 30 ug/kg/min via INTRAVENOUS
  Administered 2017-12-02 – 2017-12-03 (×7): 35 ug/kg/min via INTRAVENOUS
  Administered 2017-12-03: 50 ug/kg/min via INTRAVENOUS
  Filled 2017-12-01 (×11): qty 100

## 2017-12-01 MED ORDER — ROCURONIUM BROMIDE 100 MG/10ML IV SOLN
INTRAVENOUS | Status: DC | PRN
  Administered 2017-12-01 (×3): 20 mg via INTRAVENOUS
  Administered 2017-12-01: 50 mg via INTRAVENOUS
  Administered 2017-12-01 (×3): 10 mg via INTRAVENOUS

## 2017-12-01 MED ORDER — LIDOCAINE HCL (CARDIAC) 20 MG/ML IV SOLN
INTRAVENOUS | Status: DC | PRN
  Administered 2017-12-01: 100 mg via INTRAVENOUS

## 2017-12-01 MED ORDER — PROPOFOL 10 MG/ML IV BOLUS
INTRAVENOUS | Status: DC | PRN
  Administered 2017-12-01: 200 mg via INTRAVENOUS

## 2017-12-01 MED ORDER — CEFAZOLIN SODIUM-DEXTROSE 2-4 GM/100ML-% IV SOLN
INTRAVENOUS | Status: AC
  Filled 2017-12-01: qty 100

## 2017-12-01 MED ORDER — FENTANYL 2500MCG IN NS 250ML (10MCG/ML) PREMIX INFUSION
25.0000 ug/h | INTRAVENOUS | Status: DC
  Administered 2017-12-02: 150 ug/h via INTRAVENOUS
  Administered 2017-12-02 – 2017-12-03 (×2): 100 ug/h via INTRAVENOUS
  Filled 2017-12-01 (×3): qty 250

## 2017-12-01 MED ORDER — ACETAMINOPHEN 325 MG PO TABS: 650.0000 mg | ORAL_TABLET | ORAL | Status: DC | PRN

## 2017-12-01 MED ORDER — ORAL CARE MOUTH RINSE
15.0000 mL | OROMUCOSAL | Status: DC
  Administered 2017-12-01 – 2017-12-03 (×17): 15 mL via OROMUCOSAL

## 2017-12-01 MED ORDER — FENTANYL CITRATE (PF) 100 MCG/2ML IJ SOLN: 100.0000 ug | INTRAMUSCULAR | Status: DC | PRN

## 2017-12-01 MED ORDER — EPTIFIBATIDE 20 MG/10ML IV SOLN
INTRAVENOUS | Status: AC
  Filled 2017-12-01: qty 10

## 2017-12-01 MED ORDER — EPTIFIBATIDE 20 MG/10ML IV SOLN
INTRAVENOUS | Status: AC | PRN
  Administered 2017-12-01 (×2): 1.5 mg

## 2017-12-01 MED ORDER — TIROFIBAN (AGGRASTAT) BOLUS VIA INFUSION
1000.0000 ug | Freq: Once | INTRAVENOUS | Status: DC
  Filled 2017-12-01: qty 20

## 2017-12-01 MED ORDER — STROKE: EARLY STAGES OF RECOVERY BOOK
Freq: Once | Status: DC
  Filled 2017-12-01: qty 1

## 2017-12-01 MED ORDER — TICAGRELOR 90 MG PO TABS
ORAL_TABLET | ORAL | Status: AC
  Filled 2017-12-01: qty 2

## 2017-12-01 MED ORDER — ASPIRIN 81 MG PO CHEW
CHEWABLE_TABLET | ORAL | Status: AC | PRN
  Administered 2017-12-01: 81 mg

## 2017-12-01 MED ORDER — NICARDIPINE HCL IN NACL 20-0.86 MG/200ML-% IV SOLN: 0.0000 mg/h | INTRAVENOUS | Status: DC

## 2017-12-01 MED ORDER — SODIUM CHLORIDE 0.9 % IV SOLN
INTRAVENOUS | Status: DC
Start: 2017-12-01 — End: 2017-12-06
  Administered 2017-12-01 – 2017-12-03 (×3): via INTRAVENOUS

## 2017-12-01 MED ORDER — IOPAMIDOL (ISOVUE-300) INJECTION 61%
INTRAVENOUS | Status: AC
  Administered 2017-12-01: 30 mL
  Filled 2017-12-01: qty 150

## 2017-12-01 MED ORDER — SODIUM CHLORIDE 0.9 % IV SOLN: INTRAVENOUS | Status: DC

## 2017-12-01 MED ORDER — ALTEPLASE (STROKE) FULL DOSE INFUSION
90.0000 mg | Freq: Once | INTRAVENOUS | Status: AC
  Administered 2017-12-01: 90 mg via INTRAVENOUS
  Filled 2017-12-01: qty 100

## 2017-12-01 MED ORDER — ACETAMINOPHEN 650 MG RE SUPP
650.0000 mg | RECTAL | Status: DC | PRN
Start: 2017-12-01 — End: 2017-12-01

## 2017-12-01 MED ORDER — SODIUM CHLORIDE 0.9 % IV SOLN: 50.0000 mL | Freq: Once | INTRAVENOUS | Status: DC

## 2017-12-01 MED ORDER — NITROGLYCERIN 1 MG/10 ML FOR IR/CATH LAB
INTRA_ARTERIAL | Status: AC
  Filled 2017-12-01: qty 10

## 2017-12-01 MED ORDER — TICAGRELOR 60 MG PO TABS
ORAL_TABLET | ORAL | Status: AC | PRN
  Administered 2017-12-01: 180 mg

## 2017-12-01 MED ORDER — FENTANYL CITRATE (PF) 100 MCG/2ML IJ SOLN
INTRAMUSCULAR | Status: AC
  Filled 2017-12-01: qty 2

## 2017-12-01 MED ORDER — SENNOSIDES-DOCUSATE SODIUM 8.6-50 MG PO TABS: 1.0000 | ORAL_TABLET | Freq: Every evening | ORAL | Status: DC | PRN

## 2017-12-01 MED ORDER — NITROGLYCERIN 1 MG/10 ML FOR IR/CATH LAB
INTRA_ARTERIAL | Status: AC | PRN
  Administered 2017-12-01 (×2): 25 ug via INTRA_ARTERIAL

## 2017-12-01 MED ORDER — ARTIFICIAL TEARS OPHTHALMIC OINT
TOPICAL_OINTMENT | OPHTHALMIC | Status: DC | PRN
  Administered 2017-12-01: 1 via OPHTHALMIC

## 2017-12-01 MED ORDER — CEFAZOLIN SODIUM-DEXTROSE 2-3 GM-%(50ML) IV SOLR
INTRAVENOUS | Status: DC | PRN
  Administered 2017-12-01: 2 g via INTRAVENOUS

## 2017-12-01 MED ORDER — FENTANYL CITRATE (PF) 100 MCG/2ML IJ SOLN
100.0000 ug | INTRAMUSCULAR | Status: DC | PRN
  Administered 2017-12-01: 100 ug via INTRAVENOUS
  Filled 2017-12-01: qty 2

## 2017-12-01 MED ORDER — ACETAMINOPHEN 160 MG/5ML PO SOLN
650.0000 mg | ORAL | Status: DC | PRN
  Administered 2017-12-03: 650 mg
  Filled 2017-12-01: qty 20.3

## 2017-12-01 MED ORDER — PHENYLEPHRINE HCL 10 MG/ML IJ SOLN
INTRAMUSCULAR | Status: DC | PRN
  Administered 2017-12-01: 50 ug/min via INTRAVENOUS

## 2017-12-01 MED ORDER — ASPIRIN 325 MG PO TABS
325.0000 mg | ORAL_TABLET | Freq: Every day | ORAL | Status: DC
  Administered 2017-12-02 – 2017-12-04 (×3): 325 mg via ORAL
  Filled 2017-12-01 (×3): qty 1

## 2017-12-01 MED ORDER — ACETAMINOPHEN 160 MG/5ML PO SOLN: 650.0000 mg | ORAL | Status: DC | PRN

## 2017-12-01 MED ORDER — IOPAMIDOL (ISOVUE-370) INJECTION 76%
INTRAVENOUS | Status: AC
  Filled 2017-12-01: qty 50

## 2017-12-01 MED ORDER — IOPAMIDOL (ISOVUE-370) INJECTION 76%
INTRAVENOUS | Status: AC
  Administered 2017-12-01: 90 mL
  Filled 2017-12-01: qty 50

## 2017-12-01 MED ORDER — SUCCINYLCHOLINE CHLORIDE 20 MG/ML IJ SOLN
INTRAMUSCULAR | Status: DC | PRN
  Administered 2017-12-01: 140 mg via INTRAVENOUS

## 2017-12-01 MED ORDER — PROPOFOL 500 MG/50ML IV EMUL
INTRAVENOUS | Status: DC | PRN
  Administered 2017-12-01: 75 ug/kg/min via INTRAVENOUS

## 2017-12-01 MED ORDER — HEPARIN SODIUM (PORCINE) 1000 UNIT/ML IJ SOLN
INTRAMUSCULAR | Status: AC
  Filled 2017-12-01: qty 1

## 2017-12-01 MED ORDER — FENTANYL BOLUS VIA INFUSION
50.0000 ug | INTRAVENOUS | Status: DC | PRN
  Filled 2017-12-01: qty 50

## 2017-12-01 MED ORDER — SODIUM CHLORIDE 0.9 % IV SOLN
0.0000 ug/min | INTRAVENOUS | Status: DC
  Administered 2017-12-02: 5 ug/min via INTRAVENOUS
  Administered 2017-12-02: 120 ug/min via INTRAVENOUS
  Administered 2017-12-02: 70 ug/min via INTRAVENOUS
  Administered 2017-12-02: 100 ug/min via INTRAVENOUS
  Filled 2017-12-01 (×2): qty 1
  Filled 2017-12-01: qty 10
  Filled 2017-12-01 (×2): qty 1

## 2017-12-01 NOTE — Sedation Documentation (Signed)
Anesthesia case 

## 2017-12-01 NOTE — ED Triage Notes (Signed)
Pt to ED with sudden onset of right sided weakness onset 30-40 mins ago while sitting at his desk.  Pt also st's his balance is off.   Dr. Wilson Singer to triage.  Code stroke called

## 2017-12-01 NOTE — Progress Notes (Signed)
Pharmacist Code Stroke Response  Notified to mix tPA at 1623 by Dr. Rory Percy Delivered tPA to RN at 1628  Issues/delays encountered (if applicable): N/A  Trevelle Mcgurn, Rande Lawman 12/01/17 4:32 PM

## 2017-12-01 NOTE — Sedation Documentation (Signed)
Groin and pulses assessed with Marya Amsler, RN at bedside. See flowsheet.

## 2017-12-01 NOTE — Anesthesia Procedure Notes (Signed)
Procedure Name: Intubation Date/Time: 12/01/2017 5:43 PM Performed by: Suzy Bouchard, CRNA Pre-anesthesia Checklist: Patient identified, Emergency Drugs available, Suction available, Timeout performed and Patient being monitored Patient Re-evaluated:Patient Re-evaluated prior to induction Oxygen Delivery Method: Circle system utilized Preoxygenation: Pre-oxygenation with 100% oxygen Induction Type: IV induction Laryngoscope Size: Miller and 2 Grade View: Grade I Tube type: Subglottic suction tube Tube size: 8.0 mm Number of attempts: 1 Airway Equipment and Method: Stylet Placement Confirmation: ETT inserted through vocal cords under direct vision,  positive ETCO2 and breath sounds checked- equal and bilateral Secured at: 23 cm Tube secured with: Tape Dental Injury: Teeth and Oropharynx as per pre-operative assessment

## 2017-12-01 NOTE — ED Provider Notes (Signed)
[facility] EMERGENCY DEPARTMENT Provider Note   CSN: [id number] Arrival date & time: 12/01/17  1536     History   Chief Complaint Chief Complaint  Patient presents with  . Code Stroke    HPI William Lowery is a 52 y.o. male.  HPI   52 year old male with right-sided numbness.  I was called to triage by nursing to evaluate him.  Symptom onset at ~2:30 PM.  Patient first noticed numbness in his right hand which then became more generalized to his entire right side.  Symptoms have been stable since onset.  Denies any headaches.  No visual complaints currently but 2 days ago he reports a "white spot" over part of his right visual field.  This area was fixed.  It resolved after couple hours.  No change in speech.  No confusion per significant other at bedside.  He feels like he is leaning to his right side when he walks.  Past Medical History:  Diagnosis Date  . Asthma    hx of  . Heart murmur    as an infant  . Hypertension   . Sleep apnea    wears CPAP    There are no active problems to display for this patient.   Past Surgical History:  Procedure Laterality Date  . bellybutton procedure     as a newborn       Home Medications    Prior to Admission medications   Medication Sig Start Date End Date Taking? Authorizing Provider  albuterol (PROVENTIL HFA;VENTOLIN HFA) 108 (90 Base) MCG/ACT inhaler Inhale 2 puffs into the lungs every 6 (six) hours as needed for wheezing or shortness of breath. Patient not taking: Reported on 05/21/2017 09/07/16   Alveda Reasons, MD  Cholecalciferol (VITAMIN D PO) Take by mouth daily.    [provider]  losartan (COZAAR) 25 MG tablet Take 25 mg by mouth daily.    [provider]  Omega-3 Fatty Acids (FISH OIL PO) Take by mouth daily.    [provider]  SILDENAFIL CITRATE PO Take by mouth as needed.    [provider]  VITAMIN E PO Take by mouth daily.    [provider]     Family History Family History  Problem Relation Age of Onset  . Colon cancer Neg Hx   . Esophageal cancer Neg Hx   . Rectal cancer Neg Hx   . Stomach cancer Neg Hx     Social History Social History   Tobacco Use  . Smoking status: Never Smoker  . Smokeless tobacco: Current User    Types: Snuff  Substance Use Topics  . Alcohol use: No    Alcohol/week: 0.0 oz  . Drug use: No     Allergies   Patient has no known allergies.   Review of Systems Review of Systems  All systems reviewed and negative, other than as noted in HPI.   Physical Exam Updated Vital Signs BP 126/83 (BP Location: Right Arm)   Pulse (!) 103   Temp 99.2 F (37.3 C) (Oral)   Resp 20   SpO2 96%   Physical Exam  Constitutional: He is oriented to person, place, and time. He appears well-developed and well-nourished. No distress.  HENT:  Head: Normocephalic and atraumatic.  Eyes: Conjunctivae are normal. Right eye exhibits no discharge. Left eye exhibits no discharge.  Neck: Neck supple.  Cardiovascular: Normal rate, regular rhythm and normal heart sounds. Exam reveals no gallop and no  friction rub.  No murmur heard. Pulmonary/Chest: Effort normal and breath sounds normal. No respiratory distress.  Abdominal: Soft. He exhibits no distension. There is no tenderness.  Musculoskeletal: He exhibits no edema or tenderness.  Neurological: He is alert and oriented to person, place, and time. A sensory deficit is present.  Speech clear.  Content appropriate.  Cranial nerves II through XII are intact.  Strength is 4 out of 5 right upper and right lower extremity.  5 out of 5 left extremities.  Decreased sensation to light touch essentially from left face only down in his left lower extremity.  Skin: Skin is warm and dry.  Psychiatric: He has a normal mood and affect. His behavior is normal. Thought content normal.  Nursing note and vitals reviewed.    ED Treatments / Results  Labs (all labs ordered  are listed, but only abnormal results are displayed) Labs Reviewed  CBC - Abnormal; Notable for the following components:      Result Value   Hemoglobin 17.1 (*)    All other components within normal limits  COMPREHENSIVE METABOLIC PANEL - Abnormal; Notable for the following components:   Potassium 3.4 (*)    Chloride 100 (*)    Glucose, Bld 161 (*)    Total Bilirubin 2.1 (*)    All other components within normal limits  I-STAT CHEM 8, ED - Abnormal; Notable for the following components:   Chloride 98 (*)    BUN 22 (*)    Glucose, Bld 163 (*)    Hemoglobin 17.3 (*)    All other components within normal limits  PROTIME-INR  APTT  DIFFERENTIAL  I-STAT TROPONIN, ED  CBG MONITORING, ED    EKG  EKG Interpretation  Date/Time:  Saturday December 01 2017 15:43:36 EST Ventricular Rate:  95 PR Interval:  150 QRS Duration: 70 QT Interval:  348 QTC Calculation: 437 R Axis:   -5 Text Interpretation:  Normal sinus rhythm Non-specific ST-t changes Confirmed by Virgel Manifold [phone]) on 12/01/2017 4:05:34 PM       Radiology Ct Angio Head W Or Wo Contrast  Result Date: 12/01/2017 CLINICAL DATA:  Right-sided ataxia/weakness. EXAM: CT ANGIOGRAPHY HEAD AND NECK CT PERFUSION BRAIN TECHNIQUE: Multidetector CT imaging of the head and neck was performed using the standard protocol during bolus administration of intravenous contrast. Multiplanar CT image reconstructions and MIPs were obtained to evaluate the vascular anatomy. Carotid stenosis measurements (when applicable) are obtained utilizing NASCET criteria, using the distal internal carotid diameter as the denominator. Multiphase CT imaging of the brain was performed following IV bolus contrast injection. Subsequent parametric perfusion maps were calculated using RAPID software. CONTRAST:  56mL ISOVUE-370 IOPAMIDOL (ISOVUE-370) INJECTION 76% COMPARISON:  Head CT earlier today FINDINGS: CTA NECK FINDINGS Aortic arch: Partially covered and  normal-appearing. Three vessel branching. Right carotid system: Smooth and widely patent. No atheromatous changes. Left carotid system: Smooth and widely patent. No atheromatous changes. Tortuosity with partial retropharyngeal course. Vertebral arteries: No proximal subclavian stenosis, atherosclerosis, or irregularity. Codominant vertebral arteries that are smooth and widely patent to the dura. Skeleton: No acute or aggressive finding. Other neck: Bilateral thyroid nodules, 25 mm on the right and subcentimeter on the left. Upper chest: Negative Review of the MIP images confirms the above findings CTA HEAD FINDINGS Anterior circulation: Carotid siphons are smooth and widely patent. No noted atheromatous changes. No branch occlusion or flow limiting stenosis. Posterior circulation: Vertebral and basilar arteries are smooth and widely patent. Inferior cerebellar arteries are not well  visualized. There is a left P1 occlusion beginning at the origin. On coronal reformats the top of the basilar is free of clot. Venous sinuses: Patent Anatomic variants: None significant Delayed phase: Not performed in the emergent setting Review of the MIP images confirms the above findings CT Brain Perfusion Findings: CBF (<30%) Volume: 67mL Perfusion (Tmax>6.0s) volume: 49mL-seen in the left PCA distribution. Artifact from motion. Findings already known to Dr. Rory Percy at time of case opening. IMPRESSION: 1. 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 14 cc of left PCA distribution penumbra with no infarct by CTP. 2. 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 No embolic source identified in the head or neck. No atheromatous changes or chronic stenoses. 3. 2.5 cm right thyroid nodule, recommend sonographic follow-up. Electronically Signed   By: Monte Fantasia M.D.   On: 12/01/2017 16:35   Ct Angio Neck W Or Wo Contrast  Result Date: 12/01/2017 CLINICAL DATA:  Right-sided ataxia/weakness. EXAM: CT ANGIOGRAPHY HEAD AND NECK  CT PERFUSION BRAIN TECHNIQUE: Multidetector CT imaging of the head and neck was performed using the standard protocol during bolus administration of intravenous contrast. Multiplanar CT image reconstructions and MIPs were obtained to evaluate the vascular anatomy. Carotid stenosis measurements (when applicable) are obtained utilizing NASCET criteria, using the distal internal carotid diameter as the denominator. Multiphase CT imaging of the brain was performed following IV bolus contrast injection. Subsequent parametric perfusion maps were calculated using RAPID software. CONTRAST:  4mL ISOVUE-370 IOPAMIDOL (ISOVUE-370) INJECTION 76% COMPARISON:  Head CT earlier today FINDINGS: CTA NECK FINDINGS Aortic arch: Partially covered and normal-appearing. Three vessel branching. Right carotid system: Smooth and widely patent. No atheromatous changes. Left carotid system: Smooth and widely patent. No atheromatous changes. Tortuosity with partial retropharyngeal course. Vertebral arteries: No proximal subclavian stenosis, atherosclerosis, or irregularity. Codominant vertebral arteries that are smooth and widely patent to the dura. Skeleton: No acute or aggressive finding. Other neck: Bilateral thyroid nodules, 25 mm on the right and subcentimeter on the left. Upper chest: Negative Review of the MIP images confirms the above findings CTA HEAD FINDINGS Anterior circulation: Carotid siphons are smooth and widely patent. No noted atheromatous changes. No branch occlusion or flow limiting stenosis. Posterior circulation: Vertebral and basilar arteries are smooth and widely patent. Inferior cerebellar arteries are not well visualized. There is a left P1 occlusion beginning at the origin. On coronal reformats the top of the basilar is free of clot. Venous sinuses: Patent Anatomic variants: None significant Delayed phase: Not performed in the emergent setting Review of the MIP images confirms the above findings CT Brain Perfusion  Findings: CBF (<30%) Volume: 78mL Perfusion (Tmax>6.0s) volume: 60mL-seen in the left PCA distribution. Artifact from motion. Findings already known to Dr. Rory Percy at time of case opening. IMPRESSION: 1. 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 14 cc of left PCA distribution penumbra with no infarct by CTP. 2. 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 No embolic source identified in the head or neck. No atheromatous changes or chronic stenoses. 3. 2.5 cm right thyroid nodule, recommend sonographic follow-up. Electronically Signed   By: Monte Fantasia M.D.   On: 12/01/2017 16:35   Ct Cerebral Perfusion W Contrast  Result Date: 12/01/2017 CLINICAL DATA:  Right-sided ataxia/weakness. EXAM: CT ANGIOGRAPHY HEAD AND NECK CT PERFUSION BRAIN TECHNIQUE: Multidetector CT imaging of the head and neck was performed using the standard protocol during bolus administration of intravenous contrast. Multiplanar CT image reconstructions and MIPs were obtained to evaluate  the vascular anatomy. Carotid stenosis measurements (when applicable) are obtained utilizing NASCET criteria, using the distal internal carotid diameter as the denominator. Multiphase CT imaging of the brain was performed following IV bolus contrast injection. Subsequent parametric perfusion maps were calculated using RAPID software. CONTRAST:  47mL ISOVUE-370 IOPAMIDOL (ISOVUE-370) INJECTION 76% COMPARISON:  Head CT earlier today FINDINGS: CTA NECK FINDINGS Aortic arch: Partially covered and normal-appearing. Three vessel branching. Right carotid system: Smooth and widely patent. No atheromatous changes. Left carotid system: Smooth and widely patent. No atheromatous changes. Tortuosity with partial retropharyngeal course. Vertebral arteries: No proximal subclavian stenosis, atherosclerosis, or irregularity. Codominant vertebral arteries that are smooth and widely patent to the dura. Skeleton: No acute or aggressive finding. Other neck: Bilateral  thyroid nodules, 25 mm on the right and subcentimeter on the left. Upper chest: Negative Review of the MIP images confirms the above findings CTA HEAD FINDINGS Anterior circulation: Carotid siphons are smooth and widely patent. No noted atheromatous changes. No branch occlusion or flow limiting stenosis. Posterior circulation: Vertebral and basilar arteries are smooth and widely patent. Inferior cerebellar arteries are not well visualized. There is a left P1 occlusion beginning at the origin. On coronal reformats the top of the basilar is free of clot. Venous sinuses: Patent Anatomic variants: None significant Delayed phase: Not performed in the emergent setting Review of the MIP images confirms the above findings CT Brain Perfusion Findings: CBF (<30%) Volume: 13mL Perfusion (Tmax>6.0s) volume: 102mL-seen in the left PCA distribution. Artifact from motion. Findings already known to Dr. Rory Percy at time of case opening. IMPRESSION: 1. 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 14 cc of left PCA distribution penumbra with no infarct by CTP. 2. 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 No embolic source identified in the head or neck. No atheromatous changes or chronic stenoses. 3. 2.5 cm right thyroid nodule, recommend sonographic follow-up. Electronically Signed   By: Monte Fantasia M.D.   On: 12/01/2017 16:35   Ct Head Code Stroke Wo Contrast  Result Date: 12/01/2017 CLINICAL DATA:  Code stroke. Right-sided weakness beginning 30-40 minutes ago at the patient's desk. EXAM: CT HEAD WITHOUT CONTRAST TECHNIQUE: Contiguous axial images were obtained from the base of the skull through the vertex without intravenous contrast. COMPARISON:  None. FINDINGS: Brain: Small remote right cerebellar infarct inferiorly. No visible acute infarct. No hemorrhage, masslike finding, or hydrocephalus. Vascular: Concern for hyperdense left P1 segment, see sagittal reformats. No hyperdensity of MCAs. Skull: Negative  Sinuses/Orbits: Negative Other: Case reviewed in person with Dr. Rory Percy.  CTA pending ASPECTS [facility] Stroke Program Early CT Score) left hemisphere - Ganglionic level infarction (caudate, lentiform nuclei, internal capsule, insula, M1-M3 cortex): 7 - Supraganglionic infarction (M4-M6 cortex): 3 Total score (0-10 with 10 being normal): 10 IMPRESSION: 1. Possible hyperdense left P1 segment. 2. Negative for hemorrhage or visible acute infarct. 3. Small remote right inferior cerebellar infarct. Electronically Signed   By: Monte Fantasia M.D.   On: 12/01/2017 16:15    Procedures Procedures (including critical care time)  CRITICAL CARE Performed by: Virgel Manifold Total critical care time: 35 minutes Critical care time was exclusive of separately billable procedures and treating other patients. Critical care was necessary to treat or prevent imminent or life-threatening deterioration. Critical care was time spent personally by me on the following activities: development of treatment plan with patient and/or surrogate as well as nursing, discussions with consultants, evaluation of patient's response to treatment, examination of patient, obtaining history from patient or surrogate,  ordering and performing treatments and interventions, ordering and review of laboratory studies, ordering and review of radiographic studies, pulse oximetry and re-evaluation of patient's condition.   Medications Ordered in ED Medications - No data to display   Initial Impression / Assessment and Plan / ED Course  I have reviewed the triage vital signs and the nursing notes.  Pertinent labs & imaging results that were available during my care of the patient were reviewed by me and considered in my medical decision making (see chart for details).     52 year old male with symptoms concerning for CVA.  He was evaluated in triage.  Code stroke was activated.  Promptly evaluated by neurology.  Left PCA occlusion.  He will be  receiving TPA.  Admission.  Final Clinical Impressions(s) / ED Diagnoses   Final diagnoses:  Acute CVA (cerebrovascular accident) [facility])    ED Discharge Orders    None       Virgel Manifold, MD 12/01/17 1655

## 2017-12-01 NOTE — Consult Note (Signed)
PULMONARY / CRITICAL CARE MEDICINE   Name: William Lowery MRN: [id number] DOB: 03/01/1966    ADMISSION DATE:  12/01/2017 CONSULTATION DATE:  12/01/2017  REFERRING MD:  Dr. Rory Percy   CHIEF COMPLAINT:  CVA  HISTORY OF PRESENT ILLNESS:   52 year old male with PMH of HTN, HLD, OSA/OHS with CPAP at HS, Asthma   Presents to ED on 1/26 with numbness to right face, arm, and leg. Reports a few days ago that he had a headache with right eye blurriness, in which resolved on its own. CT Head with Occlusion to Left P1 segment of the PCA. Given TPA. Taken to IR for revascularization. Stent placed in the left PCA, given aggrastat IA, loaded with Brilinta and ASA prior to stent placement. Remained intubated in post-operative state. PCCM asked to consult for vent management.   PAST MEDICAL HISTORY :  He  has a past medical history of Asthma, Heart murmur, Hypertension, and Sleep apnea.  PAST SURGICAL HISTORY: He  has a past surgical history that includes bellybutton procedure.  No Known Allergies  No current facility-administered medications on file prior to encounter.    Current Outpatient Medications on File Prior to Encounter  Medication Sig  . Cholecalciferol (VITAMIN D PO) Take by mouth daily.  Marland Kitchen losartan-hydrochlorothiazide (HYZAAR) 100-25 MG tablet Take 1 tablet by mouth daily.  . Omega-3 Fatty Acids (FISH OIL PO) Take by mouth daily.  Marland Kitchen VITAMIN E PO Take by mouth daily.    FAMILY HISTORY:  His indicated that the status of his neg hx is unknown.   SOCIAL HISTORY: He  reports that  has never smoked. His smokeless tobacco use includes snuff. He reports that he does not drink alcohol or use drugs.  REVIEW OF SYSTEMS:   Unable to review as patient is intubated and sedated   SUBJECTIVE:   VITAL SIGNS: BP 99/64 (BP Location: Left Arm)   Pulse (!) 101   Temp 99.1 F (37.3 C) (Rectal)   Resp (!) 22   Ht 5\' 10"  (1.778 m)   Wt (!) 141 kg (310 lb 13.6 oz)   SpO2 100%   BMI 44.60 kg/m    HEMODYNAMICS:    VENTILATOR SETTINGS: Vent Mode: PRVC FiO2 (%):  [40 %-60 %] 40 % Set Rate:  [16 bmp] 16 bmp Vt Set:  [600 mL] 600 mL PEEP:  [5 cmH20] 5 cmH20 Plateau Pressure:  [22 cmH20] 22 cmH20  INTAKE / OUTPUT: I/O last 3 completed shifts: In: -  Out: 500 [Urine:500]  PHYSICAL EXAMINATION: General: Adult male, on vent, no distress  Neuro:  Pupils pin point, sedated  HEENT:  ETT in place  Cardiovascular:  RRR, no MRG  Lungs:  Clear breath sounds, no wheeze/crackles  Abdomen:  Obese, active bowel sounds  Musculoskeletal:  -edema  Skin:  Warm, dry, intact   LABS:  BMET Recent Labs  Lab 12/01/17 1550 12/01/17 1600  NA 139 142  K 3.4* 3.6  CL 100* 98*  CO2 26  --   BUN 18 22*  CREATININE 1.08 1.00  GLUCOSE 161* 163*    Electrolytes Recent Labs  Lab 12/01/17 1550  CALCIUM 9.0    CBC Recent Labs  Lab 12/01/17 1550 12/01/17 1600  WBC 10.5  --   HGB 17.1* 17.3*  HCT 48.8 51.0  PLT 243  --     Coag's Recent Labs  Lab 12/01/17 1550  APTT 27  INR 0.92    Sepsis Markers No results for input(s): LATICACIDVEN, PROCALCITON,  O2SATVEN in the last 168 hours.  ABG No results for input(s): PHART, PCO2ART, PO2ART in the last 168 hours.  Liver Enzymes Recent Labs  Lab 12/01/17 1550  AST 39  ALT 51  ALKPHOS 68  BILITOT 2.1*  ALBUMIN 3.9    Cardiac Enzymes No results for input(s): TROPONINI, PROBNP in the last 168 hours.  Glucose No results for input(s): GLUCAP in the last 168 hours.  Imaging Ct Angio Head W Or Wo Contrast  Result Date: 12/01/2017 CLINICAL DATA:  Right-sided ataxia/weakness. EXAM: CT ANGIOGRAPHY HEAD AND NECK CT PERFUSION BRAIN TECHNIQUE: Multidetector CT imaging of the head and neck was performed using the standard protocol during bolus administration of intravenous contrast. Multiplanar CT image reconstructions and MIPs were obtained to evaluate the vascular anatomy. Carotid stenosis measurements (when applicable) are  obtained utilizing NASCET criteria, using the distal internal carotid diameter as the denominator. Multiphase CT imaging of the brain was performed following IV bolus contrast injection. Subsequent parametric perfusion maps were calculated using RAPID software. CONTRAST:  40mL ISOVUE-370 IOPAMIDOL (ISOVUE-370) INJECTION 76% COMPARISON:  Head CT earlier today FINDINGS: CTA NECK FINDINGS Aortic arch: Partially covered and normal-appearing. Three vessel branching. Right carotid system: Smooth and widely patent. No atheromatous changes. Left carotid system: Smooth and widely patent. No atheromatous changes. Tortuosity with partial retropharyngeal course. Vertebral arteries: No proximal subclavian stenosis, atherosclerosis, or irregularity. Codominant vertebral arteries that are smooth and widely patent to the dura. Skeleton: No acute or aggressive finding. Other neck: Bilateral thyroid nodules, 25 mm on the right and subcentimeter on the left. Upper chest: Negative Review of the MIP images confirms the above findings CTA HEAD FINDINGS Anterior circulation: Carotid siphons are smooth and widely patent. No noted atheromatous changes. No branch occlusion or flow limiting stenosis. Posterior circulation: Vertebral and basilar arteries are smooth and widely patent. Inferior cerebellar arteries are not well visualized. There is a left P1 occlusion beginning at the origin. On coronal reformats the top of the basilar is free of clot. Venous sinuses: Patent Anatomic variants: None significant Delayed phase: Not performed in the emergent setting Review of the MIP images confirms the above findings CT Brain Perfusion Findings: CBF (<30%) Volume: 33mL Perfusion (Tmax>6.0s) volume: 33mL-seen in the left PCA distribution. Artifact from motion. Findings already known to Dr. Rory Percy at time of case opening. IMPRESSION: 1. 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 14 cc of left PCA distribution penumbra with no infarct by CTP. 2. 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 No embolic source identified in the head or neck. No atheromatous changes or chronic stenoses. 3. 2.5 cm right thyroid nodule, recommend sonographic follow-up. Electronically Signed   By: Monte Fantasia M.D.   On: 12/01/2017 16:35   Ct Head Wo Contrast  Result Date: 12/01/2017 CLINICAL DATA:  Status post P1 segment PCA thrombectomy EXAM: CT HEAD WITHOUT CONTRAST TECHNIQUE: Contiguous axial images were obtained from the base of the skull through the vertex without intravenous contrast. COMPARISON:  Cerebral angiogram 12/01/2017 Head CT 12/01/2017 CTA head/neck 12/01/2017 FINDINGS: Brain: There is subarachnoid hyperdensity in the posterior left temporal lobe and left occipital lobe. No intraparenchymal hemorrhage. No midline shift or other mass effect. No hydrocephalus. Old right cerebellar stroke. Vascular: Mild intravascular enhancement related to earlier contrast enhanced studies. Skull: Normal visualized skull base, calvarium and extracranial soft tissues. Sinuses/Orbits: No sinus fluid levels or advanced mucosal thickening. No mastoid effusion. Normal orbits. IMPRESSION: 1. Subarachnoid hyperdensity within the posterior left temporal lobe and left  occipital lobe, which could be contrast staining or petechial/subarachnoid hemorrhage. 2. No herniation, hydrocephalus or intraparenchymal hematoma. Electronically Signed   By: Ulyses Jarred M.D.   On: 12/01/2017 22:33   Ct Angio Neck W Or Wo Contrast  Result Date: 12/01/2017 CLINICAL DATA:  Right-sided ataxia/weakness. EXAM: CT ANGIOGRAPHY HEAD AND NECK CT PERFUSION BRAIN TECHNIQUE: Multidetector CT imaging of the head and neck was performed using the standard protocol during bolus administration of intravenous contrast. Multiplanar CT image reconstructions and MIPs were obtained to evaluate the vascular anatomy. Carotid stenosis measurements (when applicable) are obtained utilizing NASCET criteria, using the  distal internal carotid diameter as the denominator. Multiphase CT imaging of the brain was performed following IV bolus contrast injection. Subsequent parametric perfusion maps were calculated using RAPID software. CONTRAST:  65mL ISOVUE-370 IOPAMIDOL (ISOVUE-370) INJECTION 76% COMPARISON:  Head CT earlier today FINDINGS: CTA NECK FINDINGS Aortic arch: Partially covered and normal-appearing. Three vessel branching. Right carotid system: Smooth and widely patent. No atheromatous changes. Left carotid system: Smooth and widely patent. No atheromatous changes. Tortuosity with partial retropharyngeal course. Vertebral arteries: No proximal subclavian stenosis, atherosclerosis, or irregularity. Codominant vertebral arteries that are smooth and widely patent to the dura. Skeleton: No acute or aggressive finding. Other neck: Bilateral thyroid nodules, 25 mm on the right and subcentimeter on the left. Upper chest: Negative Review of the MIP images confirms the above findings CTA HEAD FINDINGS Anterior circulation: Carotid siphons are smooth and widely patent. No noted atheromatous changes. No branch occlusion or flow limiting stenosis. Posterior circulation: Vertebral and basilar arteries are smooth and widely patent. Inferior cerebellar arteries are not well visualized. There is a left P1 occlusion beginning at the origin. On coronal reformats the top of the basilar is free of clot. Venous sinuses: Patent Anatomic variants: None significant Delayed phase: Not performed in the emergent setting Review of the MIP images confirms the above findings CT Brain Perfusion Findings: CBF (<30%) Volume: 1mL Perfusion (Tmax>6.0s) volume: 10mL-seen in the left PCA distribution. Artifact from motion. Findings already known to Dr. Rory Percy at time of case opening. IMPRESSION: 1. 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 14 cc of left PCA distribution penumbra with no infarct by CTP. 2. 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 No embolic source identified in the head or neck. No atheromatous changes or chronic stenoses. 3. 2.5 cm right thyroid nodule, recommend sonographic follow-up. Electronically Signed   By: Monte Fantasia M.D.   On: 12/01/2017 16:35   Ct Cerebral Perfusion W Contrast  Result Date: 12/01/2017 CLINICAL DATA:  Right-sided ataxia/weakness. EXAM: CT ANGIOGRAPHY HEAD AND NECK CT PERFUSION BRAIN TECHNIQUE: Multidetector CT imaging of the head and neck was performed using the standard protocol during bolus administration of intravenous contrast. Multiplanar CT image reconstructions and MIPs were obtained to evaluate the vascular anatomy. Carotid stenosis measurements (when applicable) are obtained utilizing NASCET criteria, using the distal internal carotid diameter as the denominator. Multiphase CT imaging of the brain was performed following IV bolus contrast injection. Subsequent parametric perfusion maps were calculated using RAPID software. CONTRAST:  54mL ISOVUE-370 IOPAMIDOL (ISOVUE-370) INJECTION 76% COMPARISON:  Head CT earlier today FINDINGS: CTA NECK FINDINGS Aortic arch: Partially covered and normal-appearing. Three vessel branching. Right carotid system: Smooth and widely patent. No atheromatous changes. Left carotid system: Smooth and widely patent. No atheromatous changes. Tortuosity with partial retropharyngeal course. Vertebral arteries: No proximal subclavian stenosis, atherosclerosis, or irregularity. Codominant vertebral arteries that are smooth and widely patent to the  dura. Skeleton: No acute or aggressive finding. Other neck: Bilateral thyroid nodules, 25 mm on the right and subcentimeter on the left. Upper chest: Negative Review of the MIP images confirms the above findings CTA HEAD FINDINGS Anterior circulation: Carotid siphons are smooth and widely patent. No noted atheromatous changes. No branch occlusion or flow limiting stenosis. Posterior circulation: Vertebral and basilar arteries  are smooth and widely patent. Inferior cerebellar arteries are not well visualized. There is a left P1 occlusion beginning at the origin. On coronal reformats the top of the basilar is free of clot. Venous sinuses: Patent Anatomic variants: None significant Delayed phase: Not performed in the emergent setting Review of the MIP images confirms the above findings CT Brain Perfusion Findings: CBF (<30%) Volume: 57mL Perfusion (Tmax>6.0s) volume: 72mL-seen in the left PCA distribution. Artifact from motion. Findings already known to Dr. Rory Percy at time of case opening. IMPRESSION: 1. 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 14 cc of left PCA distribution penumbra with no infarct by CTP. 2. 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 No embolic source identified in the head or neck. No atheromatous changes or chronic stenoses. 3. 2.5 cm right thyroid nodule, recommend sonographic follow-up. Electronically Signed   By: Monte Fantasia M.D.   On: 12/01/2017 16:35   Ct Head Code Stroke Wo Contrast  Result Date: 12/01/2017 CLINICAL DATA:  Code stroke. Right-sided weakness beginning 30-40 minutes ago at the patient's desk. EXAM: CT HEAD WITHOUT CONTRAST TECHNIQUE: Contiguous axial images were obtained from the base of the skull through the vertex without intravenous contrast. COMPARISON:  None. FINDINGS: Brain: Small remote right cerebellar infarct inferiorly. No visible acute infarct. No hemorrhage, masslike finding, or hydrocephalus. Vascular: Concern for hyperdense left P1 segment, see sagittal reformats. No hyperdensity of MCAs. Skull: Negative Sinuses/Orbits: Negative Other: Case reviewed in person with Dr. Rory Percy.  CTA pending ASPECTS [facility] Stroke Program Early CT Score) left hemisphere - Ganglionic level infarction (caudate, lentiform nuclei, internal capsule, insula, M1-M3 cortex): 7 - Supraganglionic infarction (M4-M6 cortex): 3 Total score (0-10 with 10 being normal): 10 IMPRESSION: 1. Possible  hyperdense left P1 segment. 2. Negative for hemorrhage or visible acute infarct. 3. Small remote right inferior cerebellar infarct. Electronically Signed   By: Monte Fantasia M.D.   On: 12/01/2017 16:15     STUDIES:  CT Head 1/26 > 1. Possible hyperdense left P1 segment. 2. Negative for hemorrhage or visible acute infarct. 3. Small remote right inferior cerebellar infarct. CTA 1/26 > 1. 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 14 cc of left PCA distribution penumbra with no infarct by CTP. 2. 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 No embolic source identified in the head or neck. No atheromatous changes or chronic stenoses. 3. 2.5 cm right thyroid nodule, recommend sonographic follow-up. CT Head 1/26 >  1. Subarachnoid hyperdensity within the posterior left temporal lobe and left occipital lobe, which could be contrast staining or petechial/subarachnoid hemorrhage. 2. No herniation, hydrocephalus or intraparenchymal hematoma. CXR 1/26 > Endotracheal tube tip at the level of the clavicular heads. Orogastric tube courses beyond the field of view. There is cardiomegaly and pulmonary vascular congestion without overt edema. No focal airspace consolidation. No sizable pleural effusion.  CULTURES: None.   ANTIBIOTICS: Ancef   SIGNIFICANT EVENTS: 1/26 > Presents to ED   LINES/TUBES: ETT 1/26 >> Left Radial Aline 1/26 >>   DISCUSSION: 52 year old male presents with PCA occlusion, given TPA and taken to IR. Remained  intubated s/p procedure.   ASSESSMENT / PLAN:  PULMONARY A: Respiratory Insufficieny in the post-operative state   Mild Respiratory Acidosis  H/O Asthma, OSA/OHS CPAP at HS  P:   Vent Support > 8cc/kg  Rate increased to 18  CXR and ABG now  VAP Bundle   CARDIOVASCULAR A:  H/O HTN, HLD  P:  Cardiac Monitoring Maintain Systolic [phone]   ECHO pending   RENAL A:   Hypokalemia  P:   Trend BMP  Replace electrolytes as indicated  40  meq K now   GASTROINTESTINAL A:   SUP  Dysphagia  P:   NPO PPI Speech Therapy Evaluation once extubated   HEMATOLOGIC A:   S/P TPA at 1628  P:  Trend CBC  Loaded with Brilinta and ASA prior to stent placement  Continue ASA per Dr. Estanislado Pandy   INFECTIOUS A:   No issues  P:   Trend WBC and Fever Curve   ENDOCRINE A:   Hyperglycemia    P:   Trend Glucose  SSI  NEUROLOGIC A:   Left PCA occlusion s/p TPA and Thrombectomy  P:   RASS goal: 0/-1 Per Neurology  Wean Propofol and Start Fentanyl gtt (due to hypotension) to achieve RASS MR Brain pending    FAMILY  - Updates: wife updated at bedside   - Inter-disciplinary family meet or Palliative Care meeting due by: 12/09/2017   CC Time: 60 minutes  Hayden Pedro, AGACNP-BC [facility]  Pgr: [phone]  PCCM Pgr: [phone]

## 2017-12-01 NOTE — ED Notes (Signed)
Pt's wife, Mehdi Gironda [phone]

## 2017-12-01 NOTE — ED Notes (Signed)
ACTIVATED CODE STROKE WITH CARELINK PER DR.KOHUT

## 2017-12-01 NOTE — Procedures (Signed)
S/P bilateral common carotid and vertebral artery angiograms,followed  By partial revascularization of Lt PCA with x 2 passes with solitaire 20mm x 40 mm retriever device ,x 1 pass with the embotrap retriever device  And placement of intracranial stent in the Lt PCA for recurrent occlusion after retriever passes achieving a TICI 2b re pertfusion,followe by .75 mg og aggrastat IA into the basilar artery with slow revascularization of intrastent occlusion.due to acute platelet aggregation. Also loaded with brilinta 180 mg and aspirin 81 mg via orogastric tube prior to stent placement

## 2017-12-01 NOTE — Transfer of Care (Signed)
Immediate Anesthesia Transfer of Care Note  Patient: William Lowery  Procedure(s) Performed: RADIOLOGY WITH ANESTHESIA CODE STROKE (N/A )  Patient Location: ICU  Anesthesia Type:General  Level of Consciousness: sedated and Patient remains intubated per anesthesia plan  Airway & Oxygen Therapy: Patient remains intubated per anesthesia plan and Patient placed on Ventilator (see vital sign flow sheet for setting)  Post-op Assessment: Report given to RN and Post -op Vital signs reviewed and stable  Post vital signs: Reviewed and stable  Last Vitals:  Vitals:   12/01/17 1715 12/01/17 2205  BP: 131/76   Pulse: 92   Resp: 16   Temp:    SpO2: 95% (P) 99%    Last Pain:  Vitals:   12/01/17 1553  TempSrc: Oral         Complications: No apparent anesthesia complications

## 2017-12-01 NOTE — ED Notes (Signed)
CODE STROKE ACTIVATED  

## 2017-12-01 NOTE — H&P (Signed)
Stroke history physical  CC: Right-sided numbness  History is obtained from: Patient, his wife.  HPI: William Lowery is a 52 y.o. male past medical history of hypertension, sleep apnea, was in his usual state of health until about 2:15 PM when he started noticing some numbness on the right face arm and leg.  He said he felt like as if his whole right side of the body has gone to sleep.  A few days ago he had some headache with right eye visual blurriness.  That resolved on its own without any intervention or medication. He has never had a stroke in the past.  He denies any frank weakness but says his mouth feels numb and his arm and leg feels more tingly. Denies any visual symptoms.  Denies any current nausea or vomiting.  Denies current headache. No history of migraines. Denies any neck pain or back pain.  Denies drug use.  Denies tobacco or alcohol use.   LKW: 1415 h on 12/01/2017 tpa given?:  Yes Premorbid modified Rankin scale (mRS): 0  ROS:ROS was performed and is negative except as noted in the HPI.   Past Medical History:  Diagnosis Date  . Asthma    hx of  . Heart murmur    as an infant  . Hypertension   . Sleep apnea    wears CPAP     Family History  Problem Relation Age of Onset  . Colon cancer Neg Hx   . Esophageal cancer Neg Hx   . Rectal cancer Neg Hx   . Stomach cancer Neg Hx     Social History:   reports that  has never smoked. His smokeless tobacco use includes snuff. He reports that he does not drink alcohol or use drugs.  Medications  Current Facility-Administered Medications:  .   stroke: mapping our early stages of recovery book, , Does not apply, Once, Amie Portland, MD .  0.9 %  sodium chloride infusion, 500 mL, Intravenous, Continuous, Milus Banister, MD .  [COMPLETED] alteplase (ACTIVASE) 1 mg/mL infusion 90 mg, 90 mg, Intravenous, Once, Stopped at 12/01/17 1730 **FOLLOWED BY** 0.9 %  sodium chloride infusion, 50 mL, Intravenous, Once, Amie Portland, MD .  0.9 %  sodium chloride infusion, , Intravenous, Continuous, Amie Portland, MD .  acetaminophen (TYLENOL) tablet 650 mg, 650 mg, Oral, Q4H PRN **OR** acetaminophen (TYLENOL) solution 650 mg, 650 mg, Per Tube, Q4H PRN **OR** acetaminophen (TYLENOL) suppository 650 mg, 650 mg, Rectal, Q4H PRN, Amie Portland, MD .  ceFAZolin (ANCEF) 2-4 GM/100ML-% IVPB, , , ,  .  eptifibatide (INTEGRILIN) 20 MG/10ML injection, , , ,  .  iopamidol (ISOVUE-300) 61 % injection, , , ,  .  iopamidol (ISOVUE-370) 76 % injection, , , ,  .  nitroGLYCERIN 100 MCG/ML intra-arterial injection, , , ,  .  senna-docusate (Senokot-S) tablet 1 tablet, 1 tablet, Oral, QHS PRN, Amie Portland, MD  Current Outpatient Medications:  .  Cholecalciferol (VITAMIN D PO), Take by mouth daily., Disp: , Rfl:  .  losartan-hydrochlorothiazide (HYZAAR) 100-25 MG tablet, Take 1 tablet by mouth daily., Disp: , Rfl: 3 .  Omega-3 Fatty Acids (FISH OIL PO), Take by mouth daily., Disp: , Rfl:  .  VITAMIN E PO, Take by mouth daily., Disp: , Rfl:   Facility-Administered Medications Ordered in Other Encounters:  .  artificial tears (LACRILUBE) ophthalmic ointment, , , Anesthesia Intra-op, Suzy Bouchard, CRNA, 1 application at 69/48/54 1743 .  ceFAZolin (ANCEF) IVPB 2  g/50 mL premix, , Intravenous, Anesthesia Intra-op, Suzy Bouchard, CRNA, 2 g at 12/01/17 1750 .  fentaNYL (SUBLIMAZE) injection, , , Anesthesia Intra-op, Suzy Bouchard, CRNA, 50 mcg at 12/01/17 1745 .  lactated ringers infusion, , , Continuous PRN, Suzy Bouchard, CRNA .  lidocaine (cardiac) 100 mg/38ml (XYLOCAINE) 20 MG/ML injection 2%, , , Anesthesia Intra-op, Suzy Bouchard, CRNA, 100 mg at 12/01/17 1742 .  phenylephrine (NEO-SYNEPHRINE) 0.04 mg/mL in dextrose 5 % 250 mL infusion, , , Continuous PRN, Suzy Bouchard, CRNA, Last Rate: 112.5 mL/hr at 12/01/17 1802, 75 mcg/min at 12/01/17 1802 .  propofol (DIPRIVAN) 10 mg/mL bolus/IV push, , , Anesthesia Intra-op,  Suzy Bouchard, CRNA, 200 mg at 12/01/17 1742 .  rocuronium (ZEMURON) injection, , , Anesthesia Intra-op, Suzy Bouchard, CRNA, 20 mg at 12/01/17 1754 .  succinylcholine (ANECTINE) injection, , , Anesthesia Intra-op, Suzy Bouchard, CRNA, 140 mg at 12/01/17 1742   Exam: Current vital signs: BP 131/76   Pulse 92   Temp 99.2 F (37.3 C) (Oral)   Resp 16   Wt (!) 140.8 kg (310 lb 6.5 oz)   SpO2 95%   BMI 43.29 kg/m   Vital signs in last 24 hours: Temp:  [99.2 F (37.3 C)] 99.2 F (37.3 C) (01/26 1553) Pulse Rate:  [88-103] 92 (01/26 1715) Resp:  [16-20] 16 (01/26 1715) BP: (121-131)/(76-91) 131/76 (01/26 1715) SpO2:  [95 %-98 %] 95 % (01/26 1715) Weight:  [140.8 kg (310 lb 6.5 oz)] 140.8 kg (310 lb 6.5 oz) (01/26 1630)  GENERAL: Awake, alert in NAD HEENT: - Normocephalic and atraumatic, dry mm, no LN++, no Thyromegally LUNGS - Clear to auscultation bilaterally with no wheezes CV - S1S2 RRR, no m/r/g, equal pulses bilaterally. ABDOMEN - Soft, nontender, obese, with normoactive BS Ext: warm, well perfused, intact peripheral pulses, no edema  NEURO:  Mental Status: AA&Ox3  Language: speech is.  Not dysarthric.  Naming, repetition, fluency, and comprehension intact. Cranial Nerves: PERRL. EOMI, visual fields full, no facial asymmetry, facial sensation intact, hearing intact, tongue/uvula/soft palate midline, normal sternocleidomastoid and trapezius muscle strength. No evidence of tongue atrophy or fibrillations Motor: Right upper extremities 4+/5 with some vertical drift.  Right lower extremity with very mild drift 4+/5.  Left upper and lower extremity 5/5.  Normal tone normal range of motion. Sensation-decreased light touch on right hemibody. Coordination: Ataxic on finger-nose-finger on the right.  Normal heel-knee-shin both sides. Gait- deferred  NIHSS 1a Level of Conscious.: 0 1b LOC Questions: 0 1c LOC Commands: 0 2 Best Gaze: 0 3 Visual: 0 4 Facial Palsy: 0 5a  Motor Arm - left: 0 5b Motor Arm - Right: 1 6a Motor Leg - Left: 0 6b Motor Leg - Right: 1 7 Limb Ataxia: 0 8 Sensory: 1 9 Best Language: 0 10 Dysarthria: 0 11 Extinct. and Inatten.: 0 TOTAL: 3  Labs I have reviewed labs in epic and the results pertinent to this consultation are:  CBC    Component Value Date/Time   WBC 10.5 12/01/2017 1550   RBC 5.50 12/01/2017 1550   HGB 17.3 (H) 12/01/2017 1600   HCT 51.0 12/01/2017 1600   PLT 243 12/01/2017 1550   MCV 88.7 12/01/2017 1550   MCH 31.1 12/01/2017 1550   MCHC 35.0 12/01/2017 1550   RDW 12.9 12/01/2017 1550   LYMPHSABS 2.9 12/01/2017 1550   MONOABS 0.4 12/01/2017 1550   EOSABS 0.2 12/01/2017 1550   BASOSABS 0.0 12/01/2017 1550  CMP     Component Value Date/Time   NA 142 12/01/2017 1600   K 3.6 12/01/2017 1600   CL 98 (L) 12/01/2017 1600   CO2 26 12/01/2017 1550   GLUCOSE 163 (H) 12/01/2017 1600   BUN 22 (H) 12/01/2017 1600   CREATININE 1.00 12/01/2017 1600   CALCIUM 9.0 12/01/2017 1550   PROT 7.0 12/01/2017 1550   ALBUMIN 3.9 12/01/2017 1550   AST 39 12/01/2017 1550   ALT 51 12/01/2017 1550   ALKPHOS 68 12/01/2017 1550   BILITOT 2.1 (H) 12/01/2017 1550   GFRNONAA >60 12/01/2017 1550   GFRAA >60 12/01/2017 1550   Imaging I have reviewed the images obtained:  CT-scan of the brain -possible dense left P1, possible subtle hypodensity in the left PCA territory small area. CT angiogram head and neck shows left P1 occlusion.  CT perfusion shows 14 cc ischemic penumbra in the left PCA territory with no core.  Incidental right thyroid nodule.  Assessment:  52 year old with a left PCA stroke. Presented within the TPA window-hence given TPA.  Detailed discussion about benefits of TPA and intervention in the setting of mild stroke as evidenced by initial NIH of 3.  Given the location of the posterior circulation stroke and the possible clot right at the proximal P1, called endovascular specialist for possible diagnostic  cerebral angiogram and thrombectomy after giving the patient TPA. Had to have a detailed discussion with the patient and his wife about the benefits and risks of TPA, which also led to the discussion that we should first confirm that there is a stroke by doing a CTA if there is any acute clot.  TPA was held until CTA was performed.  Once there was identification of a PCA occlusion, TPA was given.  Endovascular team was then contacted.  Patient is currently in the angios suite for thrombectomy.  Impression Acute ischemic stroke involving the left P1 segment of the PCA. Etiology likely cardio embolic versus thrombotic. Stroke risk factors include hypertension and sleep apnea.  Plan CNS -Admit to neuro ICU - Vitals per post TPA protocol - No aspirin or heparin for at least 24 hours after TPA.  Next line-24-hour CT or MRI - Can resume aspirin and DVT prophylaxis if 24-hour imaging is negative for acute bleed. - Systolic blood pressure less than 180 post TPA.  If successful thrombectomy, systolic blood pressure parameters 100 140 -Frequent neuro checks -2D echo -A1c LDL -Telemetry -PT OT speech therapy for evaluation of dysarthria or dysphagia that might occur as a result of the acute ischemic stroke   RESP Ventilated for thrombectomy -vent management per ICU -wean when able  CV Essential (primary) hypertension -under good control with medications. -Blood pressure goals as above. -2D echo  Hyperlipidemia, unspecified  - Statin for goal LDL < 70  Paroxysmal atrial fibrillation Chronic atrial fibrillation -Rate control -Continue BB -Repeat CT in 2 weeks for consideration of starting anticoagulation if CT is stable.  Coumadin with goal INR 2-3 and stop  antiplatelet once INR >2, provide Coumadin teaching.   HEME -Monitor -transfuse for hgb < 7 -Trend coags  ENDO Check A1c -goal HgbA1c < 7  GI/GU Gentle hydration  Fluid/Electrolyte Disorders -Repeat labs -Replete  electrolytes  Nutrition E66.9 Obesity  -diet consult  Prophylaxis DVT: SCD GI: PPI per ICU team vent order set Bowel: Doc senna  Diet: NPO until cleared by speech  Code Status: Full Code  -- Amie Portland, MD [facility] Pager: [phone] If 7pm to 7am,  please call on call as listed on AMION.  CRITICAL CARE ATTESTATION This patient is critically ill and at significant risk of neurological worsening, death and care requires constant monitoring of vital signs, hemodynamics,respiratory and cardiac monitoring. I spent 60  minutes of neurocritical care time performing neurological assessment, discussion with family, other specialists and medical decision making of high complexityin the care of  this patient.

## 2017-12-01 NOTE — ED Notes (Signed)
Pt's bilateral groin shaved, pedal pulses marked.

## 2017-12-01 NOTE — ED Notes (Signed)
Pt reports visual changes to the right eye Thursday night that have resolved.

## 2017-12-01 NOTE — Anesthesia Procedure Notes (Signed)
Arterial Line Insertion Start/End1/26/2019 5:40 PM, 12/01/2017 5:40 PM Performed by: CRNA  Patient location: Pre-op. Preanesthetic checklist: patient identified, IV checked, site marked, risks and benefits discussed, surgical consent, monitors and equipment checked, pre-op evaluation, timeout performed and anesthesia consent Lidocaine 1% used for infiltration Left, radial was placed Catheter size: 20 Fr Hand hygiene performed  and maximum sterile barriers used   Attempts: 1 Procedure performed without using ultrasound guided technique. Following insertion, dressing applied. Post procedure assessment: normal and unchanged

## 2017-12-01 NOTE — Anesthesia Preprocedure Evaluation (Addendum)
Anesthesia Evaluation  Patient identified by MRN, date of birth, ID band Patient awake    Reviewed: Allergy & Precautions, NPO status , Patient's Chart, lab work & pertinent test results  Airway Mallampati: III  TM Distance: >3 FB Neck ROM: Full    Dental  (+) Chipped, Dental Advisory Given,    Pulmonary asthma , sleep apnea and Continuous Positive Airway Pressure Ventilation ,    Pulmonary exam normal breath sounds clear to auscultation       Cardiovascular hypertension, Pt. on medications Normal cardiovascular exam Rhythm:Regular Rate:Normal  ECG: NSR, rate 95   Neuro/Psych CVA, Residual Symptoms negative psych ROS   GI/Hepatic negative GI ROS, Neg liver ROS,   Endo/Other  negative endocrine ROS  Renal/GU negative Renal ROS     Musculoskeletal   Abdominal   Peds  Hematology negative hematology ROS (+)   Anesthesia Other Findings CODE STROKE Full beard  Reproductive/Obstetrics                           Anesthesia Physical Anesthesia Plan  ASA: III and emergent  Anesthesia Plan: General   Post-op Pain Management:    Induction: Intravenous and Rapid sequence  PONV Risk Score and Plan: 2 and Ondansetron and Treatment may vary due to age or medical condition  Airway Management Planned: Oral ETT  Additional Equipment: Arterial line  Intra-op Plan:   Post-operative Plan: Possible Post-op intubation/ventilation  Informed Consent: I have reviewed the patients History and Physical, chart, labs and discussed the procedure including the risks, benefits and alternatives for the proposed anesthesia with the patient or authorized representative who has indicated his/her understanding and acceptance.   Dental advisory given  Plan Discussed with: CRNA  Anesthesia Plan Comments:        Anesthesia Quick Evaluation

## 2017-12-01 NOTE — ED Notes (Signed)
Pt arrived in IR °

## 2017-12-01 NOTE — Progress Notes (Signed)
Patient ID: Barre Aydelott, male   DOB: May 14, 1966, 53 y.o.   MRN: [id number] INR 52 yr old RT H M LSW 2.30 pm. Modified premorbid rankin score of 0.. CT brain  C/O Lt visual field deficit  And RT sided numbness.. CT brain NO ICH ASPECTS 10 ?hyperdense Lt PCA sign confirmed on CTA. CTP CBF < 30 % vol 62ml.Tmax > 60 s vol 14 ml. Mismatch 14 ml  Option of endovascular treatment to prevent further neurological  injury discussed with wife. Procedure ,risks benefits alternatives all reviewed . Risks of ICH of 10 % ,with worsening  neurological function ,vent dependency ,death inability to revascularize all discussed . Questions answered.to her  satisfaction. Informed witnessed consent for endovascular treatment  under anesthesia obtained. S.Sharae Zappulla MD

## 2017-12-01 NOTE — Sedation Documentation (Addendum)
Aggrastat 0.75mg  injected via microcatheter into the basilar artery be Dr. Estanislado Pandy.

## 2017-12-02 ENCOUNTER — Inpatient Hospital Stay (HOSPITAL_COMMUNITY): Payer: Self-pay

## 2017-12-02 ENCOUNTER — Other Ambulatory Visit: Payer: Self-pay

## 2017-12-02 DIAGNOSIS — Z9989 Dependence on other enabling machines and devices: Secondary | ICD-10-CM

## 2017-12-02 DIAGNOSIS — J9602 Acute respiratory failure with hypercapnia: Secondary | ICD-10-CM

## 2017-12-02 LAB — CBC
HCT: 40.8 % (ref 39.0–52.0)
Hemoglobin: 14.2 g/dL (ref 13.0–17.0)
MCH: 31.6 pg (ref 26.0–34.0)
MCHC: 34.8 g/dL (ref 30.0–36.0)
MCV: 90.7 fL (ref 78.0–100.0)
Platelets: 268 10*3/uL (ref 150–400)
RBC: 4.5 MIL/uL (ref 4.22–5.81)
RDW: 13.7 % (ref 11.5–15.5)
WBC: 12.5 10*3/uL — ABNORMAL HIGH (ref 4.0–10.5)

## 2017-12-02 LAB — POCT I-STAT 3, ART BLOOD GAS (G3+)
Acid-base deficit: 5 mmol/L — ABNORMAL HIGH (ref 0.0–2.0)
BICARBONATE: 22.9 mmol/L (ref 20.0–28.0)
O2 Saturation: 98 %
TCO2: 24 mmol/L (ref 22–32)
pCO2 arterial: 53.5 mmHg — ABNORMAL HIGH (ref 32.0–48.0)
pH, Arterial: 7.239 — ABNORMAL LOW (ref 7.350–7.450)
pO2, Arterial: 118 mmHg — ABNORMAL HIGH (ref 83.0–108.0)

## 2017-12-02 LAB — LIPID PANEL
CHOLESTEROL: 144 mg/dL (ref 0–200)
HDL: 37 mg/dL — ABNORMAL LOW (ref >40)
LDL CALC: 58 mg/dL (ref 0–99)
TRIGLYCERIDES: 246 mg/dL — AB (ref <150)
Total CHOL/HDL Ratio: 3.9 RATIO
VLDL: 49 mg/dL — ABNORMAL HIGH (ref 0–40)

## 2017-12-02 LAB — HIV ANTIBODY (ROUTINE TESTING W REFLEX): HIV Screen 4th Generation wRfx: NONREACTIVE

## 2017-12-02 LAB — BASIC METABOLIC PANEL
Anion gap: 9 (ref 5–15)
BUN: 15 mg/dL (ref 6–20)
CALCIUM: 7.3 mg/dL — AB (ref 8.9–10.3)
CHLORIDE: 111 mmol/L (ref 101–111)
CO2: 21 mmol/L — AB (ref 22–32)
Creatinine, Ser: 0.9 mg/dL (ref 0.61–1.24)
GFR calc non Af Amer: >60 mL/min (ref >60)
Glucose, Bld: 93 mg/dL (ref 65–99)
Potassium: 3.3 mmol/L — ABNORMAL LOW (ref 3.5–5.1)
Sodium: 141 mmol/L (ref 135–145)

## 2017-12-02 LAB — PHOSPHORUS: Phosphorus: 3.3 mg/dL (ref 2.5–4.6)

## 2017-12-02 LAB — TRIGLYCERIDES: Triglycerides: 244 mg/dL — ABNORMAL HIGH (ref <150)

## 2017-12-02 LAB — ECHOCARDIOGRAM COMPLETE
HEIGHTINCHES: 70 in
Weight: 4973.58 oz

## 2017-12-02 LAB — POTASSIUM: Potassium: 3.7 mmol/L (ref 3.5–5.1)

## 2017-12-02 LAB — HEMOGLOBIN A1C
HEMOGLOBIN A1C: 5.7 % — AB (ref 4.8–5.6)
Mean Plasma Glucose: 116.89 mg/dL

## 2017-12-02 LAB — MRSA PCR SCREENING: MRSA BY PCR: NEGATIVE

## 2017-12-02 LAB — MAGNESIUM: MAGNESIUM: 2 mg/dL (ref 1.7–2.4)

## 2017-12-02 MED ORDER — SODIUM CHLORIDE 0.9 % IV SOLN
0.0000 ug/min | INTRAVENOUS | Status: DC
  Filled 2017-12-02: qty 4

## 2017-12-02 MED ORDER — PANTOPRAZOLE SODIUM 40 MG IV SOLR
40.0000 mg | INTRAVENOUS | Status: DC
  Administered 2017-12-02 – 2017-12-03 (×2): 40 mg via INTRAVENOUS
  Filled 2017-12-02 (×2): qty 40

## 2017-12-02 MED ORDER — TICAGRELOR 90 MG PO TABS
90.0000 mg | ORAL_TABLET | Freq: Two times a day (BID) | ORAL | Status: DC
  Administered 2017-12-02 – 2017-12-10 (×17): 90 mg via ORAL
  Filled 2017-12-02 (×20): qty 1

## 2017-12-02 MED ORDER — HEPARIN SODIUM (PORCINE) 5000 UNIT/ML IJ SOLN
5000.0000 [IU] | Freq: Three times a day (TID) | INTRAMUSCULAR | Status: DC
  Administered 2017-12-02 – 2017-12-04 (×5): 5000 [IU] via SUBCUTANEOUS
  Filled 2017-12-02 (×5): qty 1

## 2017-12-02 MED ORDER — POTASSIUM CHLORIDE 20 MEQ/15ML (10%) PO SOLN
40.0000 meq | Freq: Once | ORAL | Status: AC
  Administered 2017-12-02: 40 meq
  Filled 2017-12-02: qty 30

## 2017-12-02 MED ORDER — SODIUM CHLORIDE 0.9 % IV SOLN
0.0000 ug/min | INTRAVENOUS | Status: DC
  Administered 2017-12-02: 100 ug/min via INTRAVENOUS
  Administered 2017-12-02: 130 ug/min via INTRAVENOUS
  Administered 2017-12-02: 120 ug/min via INTRAVENOUS
  Administered 2017-12-02: 130 ug/min via INTRAVENOUS
  Administered 2017-12-03 (×3): 60 ug/min via INTRAVENOUS
  Filled 2017-12-02 (×8): qty 1
  Filled 2017-12-02: qty 10
  Filled 2017-12-02 (×3): qty 1

## 2017-12-02 NOTE — Progress Notes (Signed)
  Echocardiogram 2D Echocardiogram has been performed.  William Lowery 12/02/2017, 3:34 PM

## 2017-12-02 NOTE — Progress Notes (Signed)
RT transported patient to CT without complications. Vital signs stable throughout. . RT will continue to monitor.

## 2017-12-02 NOTE — Anesthesia Postprocedure Evaluation (Signed)
Anesthesia Post Note  Patient: William Lowery  Procedure(s) Performed: RADIOLOGY WITH ANESTHESIA CODE STROKE (N/A )     Patient location during evaluation: SICU Anesthesia Type: General Level of consciousness: sedated Pain management: pain level controlled Vital Signs Assessment: post-procedure vital signs reviewed and stable Respiratory status: patient remains intubated per anesthesia plan Cardiovascular status: stable Postop Assessment: no apparent nausea or vomiting Anesthetic complications: no    Last Vitals:  Vitals:   12/02/17 0600 12/02/17 0630  BP: 122/62 133/71  Pulse: 69 63  Resp: 18 18  Temp:    SpO2: 98% 99%    Last Pain:  Vitals:   12/02/17 0400  TempSrc: Axillary                 Raj Landress P Krystian Ferrentino

## 2017-12-02 NOTE — Plan of Care (Signed)
Pt currently meeting SBP goals

## 2017-12-02 NOTE — Progress Notes (Signed)
SLP Cancellation Note  Patient Details Name: Ammaar Encina MRN: [id number] DOB: 03-14-1966   Cancelled treatment:       Reason Eval/Treat Not Completed: Medical issues which prohibited therapy.  Patient is currently intubated. Nursing reported possible plans to extubate later today.   ST will follow up next date.    Shelly Flatten, MA, [facility] SLP [phone] Lamar Sprinkles 12/02/2017, 11:14 AM

## 2017-12-02 NOTE — Progress Notes (Signed)
STROKE TEAM PROGRESS NOTE   SUBJECTIVE (INTERVAL HISTORY) No family is at the bedside.  Discussed with Dr. Pearline Cables and Dr. Estanislado Pandy about the case. I also discussed with Dr. Vernard Gambles in Radiology about this case. Pt still intubated on sedation and pressor but able to move off sedation. Seems right weaker than left. Still has sheath in. Will remove this am. MRI and MRA pending.   OBJECTIVE Temp:  [98.1 F (36.7 C)-99.2 F (37.3 C)] 98.4 F (36.9 C) (01/27 0400) Pulse Rate:  [63-103] 65 (01/27 0700) Cardiac Rhythm: Normal sinus rhythm (01/27 0400) Resp:  [16-22] 18 (01/27 0700) BP: (69-133)/(49-92) 128/69 (01/27 0700) SpO2:  [95 %-100 %] 99 % (01/27 0745) Arterial Line BP: (92-148)/(55-92) 128/69 (01/27 0700) FiO2 (%):  [40 %-60 %] 40 % (01/27 0745) Weight:  [310 lb 6.5 oz (140.8 kg)-310 lb 13.6 oz (141 kg)] 310 lb 13.6 oz (141 kg) (01/26 2230)  CBC:  Recent Labs  Lab 12/01/17 1550 12/01/17 1600 12/02/17 0535  WBC 10.5  --  12.5*  NEUTROABS 7.0  --   --   HGB 17.1* 17.3* 14.2  HCT 48.8 51.0 40.8  MCV 88.7  --  90.7  PLT 243  --  664    Basic Metabolic Panel:  Recent Labs  Lab 12/01/17 1550 12/01/17 1600 12/02/17 0535  NA 139 142 141  K 3.4* 3.6 3.3*  CL 100* 98* 111  CO2 26  --  21*  GLUCOSE 161* 163* 93  BUN 18 22* 15  CREATININE 1.08 1.00 0.90  CALCIUM 9.0  --  7.3*  MG  --   --  2.0  PHOS  --   --  3.3    Lipid Panel:     Component Value Date/Time   CHOL 144 12/02/2017 0535   TRIG 246 (H) 12/02/2017 0535   TRIG 244 (H) 12/02/2017 0535   HDL 37 (L) 12/02/2017 0535   CHOLHDL 3.9 12/02/2017 0535   VLDL 49 (H) 12/02/2017 0535   LDLCALC 58 12/02/2017 0535   HgbA1c:  Lab Results  Component Value Date   HGBA1C 5.7 (H) 12/02/2017   Urine Drug Screen: No results found for: LABOPIA, COCAINSCRNUR, LABBENZ, AMPHETMU, THCU, LABBARB  Alcohol Level No results found for: Jerome I have personally reviewed the radiological images below and agree with the  radiology interpretations.  Ct Angio Head W Or Wo Contrast Ct Angio Neck W Or Wo Contrast  Ct Cerebral Perfusion W Contrast 12/01/2017 IMPRESSION:  1. 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 14 cc of left PCA distribution penumbra with no infarct by CTP.  2. 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 No embolic source identified in the head or neck. No atheromatous changes or chronic stenoses.  3. 2.5 cm right thyroid nodule, recommend sonographic follow-up.   Ct Head Wo Contrast 12/01/2017 IMPRESSION:  1. Subarachnoid hyperdensity within the posterior left temporal lobe and left occipital lobe, which could be contrast staining or petechial/subarachnoid hemorrhage.  2. No herniation, hydrocephalus or intraparenchymal hematoma.   Ct Head Code Stroke Wo Contrast 12/01/2017 IMPRESSION:  1. Possible hyperdense left P1 segment.  2. Negative for hemorrhage or visible acute infarct.  3. Small remote right inferior cerebellar infarct.   MRI and MRA pending   CT HEAD  1. Continued decrease in subarachnoid high-density along the posterior left cerebral hemisphere. No hydrocephalus. 2. Moderate acute left occipital infarct without detected progression. 3. Question small right superior cerebellar infarct.  Transthoracic Echocardiogram -  Left ventricle: The cavity size was normal. Wall thickness was   increased in a pattern of mild LVH. Systolic function was normal.   The estimated ejection fraction was in the range of 55% to 60%.   Wall motion was normal; there were no regional wall motion   abnormalities. Left ventricular diastolic function parameters   were normal. - Aortic valve: Moderately calcified annulus. Trileaflet;   moderately thickened leaflets. There was moderate stenosis. There   was mild regurgitation. Mean gradient (S): 22 mm Hg. Valve area   (VTI): 1.36 cm^2. Valve area (Vmax): 1.17 cm^2. Valve area   (Vmean): 1.25 cm^2. - Mitral valve: Mildly  calcified annulus. Mildly thickened leaflets  Cerebral angiogram - Dr. Estanislado Pandy 12/01/2017 S/P bilateral common carotid and vertebral artery angiograms,followed  By partial revascularization of Lt PCA with x 2 passes with solitaire 54mm x 40 mm retriever device ,x 1 pass with the embotrap retriever device  And placement of intracranial stent in the Lt PCA for recurrent occlusion after retriever passes achieving a TICI 2b re pertfusion,followe by .75 mg og aggrastat IA into the basilar artery with slow revascularization of intrastent occlusion.due to acute platelet aggregation. Also loaded with brilinta 180 mg and aspirin 81 mg via orogastric tube prior to stent placement   PHYSICAL EXAM Vitals:   12/02/17 0600 12/02/17 0630 12/02/17 0700 12/02/17 0745  BP: 122/62 133/71 128/69   Pulse: 69 63 65   Resp: 18 18 18    Temp:      TempSrc:      SpO2: 98% 99% 99% 99%  Weight:      Height:        Temp:  [98.1 F (36.7 C)-99.2 F (37.3 C)] 99 F (37.2 C) (01/27 0800) Pulse Rate:  [60-103] 66 (01/27 1000) Resp:  [16-22] 18 (01/27 1000) BP: (69-135)/(49-92) 114/78 (01/27 1000) SpO2:  [95 %-100 %] 98 % (01/27 1000) Arterial Line BP: (92-148)/(55-92) 135/75 (01/27 1000) FiO2 (%):  [40 %-60 %] 40 % (01/27 0745) Weight:  [310 lb 6.5 oz (140.8 kg)-310 lb 13.6 oz (141 kg)] 310 lb 13.6 oz (141 kg) (01/26 2230)  General - Well nourished, well developed, intubated.  Ophthalmologic - Fundi not visualized due to small pupils.  Cardiovascular - Regular rate and rhythm.  Neuro - intubated on sedation and pressor. Eyes closed but able to briefly open eyes on voice, not quite following commands. PERRL, small pupils on sedation, doll's eye weak, also weak corneal, but positive gag and cough. On pain stimulation, moving all extremities but seems more movement on the left then right. DTR 1+ and no babinski. Sensation, coordination and gait not tested.     ASSESSMENT/PLAN [name] is a 52 y.o.  male with history of asthma, hypertension, and obstructive sleep apnea (wears C Pap) presenting with right-sided numbness.  The patient received TPA on Saturday, 12/01/2017 at [place]. -> Interventional radiology.  Stroke: Left PCA infarct with P1 occlusion s/p tPA and IR with TICI 2b reperfusion and left P1 stent - possibly embolic - source unknown.  Resultant  Intubated on pressor and sedation  CT head -  Possible hyperdense left P1 segment. Subarachnoid hyperdensity within the posterior left temporal lobe and left occipital lobe, which could be contrast staining or petechial/subarachnoid hemorrhage.   CT repeat decreased subarachnoid hyperdensity, left PCA infact  MRI head - pending  MRA head - pending  2D Echo - EF 55-60%  LDL - 58  HgbA1c - 5.7  VTE prophylaxis - heparin  subq Fall precautions Diet NPO time specified  No antithrombotic prior to admission, now on aspirin 325 mg daily and Brilinta  Patient counseled to be compliant with his antithrombotic medications  Ongoing aggressive stroke risk factor management  Therapy recommendations:  pending  Disposition:  Pending  Respiratory failure  Intubated for procedure  CCM on board  On sedation and neo  Extubate as able  Hypertension  Stable  Permissive hypertension (OK if < 180/105) but gradually normalize in 5-7 days  Long-term BP goal normotensive  Other Stroke Risk Factors  Obesity, Body mass index is 44.6 kg/m., recommend weight loss, diet and exercise as appropriate   Hx stroke/TIA - remote right PICA infarct by imaging  OSA, on CPAP at home  Other Active Problems  Hypokalemia - 3.3 - supplement   Mild leukocytosis - 12.5 - temp 98.8   Hospital day # 1  This patient is critically ill due to right PCA infarct s/p tPA and IR, intubated and at significant risk of neurological worsening, death form recurrent stroke, hemorrhagic conversion, seizure, respiratory failure. This patient's care requires  constant monitoring of vital signs, hemodynamics, respiratory and cardiac monitoring, review of multiple databases, neurological assessment, discussion with family, other specialists and medical decision making of high complexity. Discussed with Dr. Pearline Cables over the phone. I spent 40 minutes of neurocritical care time in the care of this patient.  Rosalin Hawking, MD PhD Stroke Neurology 12/02/2017 5:07 PM    To contact Stroke Continuity provider, please refer to [email or web address] After hours, contact General Neurology

## 2017-12-02 NOTE — Progress Notes (Signed)
Interventional Radiology here at bedside to remove 8Fr right groin sheath.  Pt id'ed via name and DOB.  Sheath removed and manual pressure applied, until hemostasis achieved, 20 mins.  V-Pad used and RN educated regarding its removal. Rt distal pulses PT +1, DP +1.  Caryl Pina RN here at bedside to review. No immediate complications noted   jkc/kh

## 2017-12-02 NOTE — Progress Notes (Signed)
Referring Physician(s): Dr. Erlinda Hong  Supervising Physician: Luanne Bras  Patient Status: [facility] - In-pt  Subjective: S/P bilateral common carotid and vertebral artery angiograms,followed  By partial revascularization of Lt PCA with x 2 passes with solitaire 18m x 40 mm retriever device ,x 1 pass with the embotrap retriever device  And placement of intracranial stent in the Lt PCA for recurrent occlusion after retriever passes achieving a TICI 2b re pertfusion,followe by .75 mg og aggrastat IA into the basilar artery with slow revascularization of intrastent occlusion.due to acute platelet aggregation. Also loaded with brilinta 180 mg and aspirin 81 mg via orogastric tube prior to stent placement  Remains intubated  Objective: Physical Exam: BP 114/78   Pulse 66   Temp 99 F (37.2 C) (Axillary)   Resp 18   Ht _0  (1.778 m)   Wt (!) 310 lb 13.6 oz (141 kg)   SpO2 98%   BMI 44.60 kg/m  Sedated/Intubated (R)groin sheath intact, no bleeding or gross hematoma Legs warm   Current Facility-Administered Medications:  .   stroke: mapping our early stages of recovery book, , Does not apply, Once, AAmie Portland MD .  [COMPLETED] alteplase (ACTIVASE) 1 mg/mL infusion 90 mg, 90 mg, Intravenous, Once, Stopped at 12/01/17 1730 **FOLLOWED BY** 0.9 %  sodium chloride infusion, 50 mL, Intravenous, Once, AAmie Portland MD .  0.9 %  sodium chloride infusion, , Intravenous, Continuous, Deveshwar, Sanjeev, MD, Last Rate: 75 mL/hr at 12/02/17 0900 .  acetaminophen (TYLENOL) tablet 650 mg, 650 mg, Oral, Q4H PRN **OR** acetaminophen (TYLENOL) solution 650 mg, 650 mg, Per Tube, Q4H PRN **OR** acetaminophen (TYLENOL) suppository 650 mg, 650 mg, Rectal, Q4H PRN, Deveshwar, Sanjeev, MD .  aspirin tablet 325 mg, 325 mg, Oral, Q breakfast, Deveshwar, Sanjeev, MD .  chlorhexidine gluconate (MEDLINE KIT) (PERIDEX) 0.12 % solution 15 mL, 15 mL, Mouth Rinse, BID, XRosalin Hawking MD, 15 mL at 12/02/17 0800 .   fentaNYL (SUBLIMAZE) bolus via infusion 50 mcg, 50 mcg, Intravenous, Q1H PRN, EOmar Person NP .  fentaNYL (SUBLIMAZE) injection 50 mcg, 50 mcg, Intravenous, Once, Eubanks, Katalina M, NP .  fentaNYL 25054m in NS 25032m24m30ml) infusion-PREMIX, 25-400 mcg/hr, Intravenous, Continuous, Eubanks, Katalina M, NP, Last Rate: 15 mL/hr at 12/02/17 0915, 150 mcg/hr at 12/02/17 0915 .  MEDLINE mouth rinse, 15 mL, Mouth Rinse, 10 times per day, Xu, Rosalin Hawking, 15 mL at 12/02/17 0935 .  nicardipine (CARDENE) 20mg19m0.86% saline 200ml 11mnfusion (0.1 mg/ml), 0-15 mg/hr, Intravenous, Continuous, DeveshLuanne BrasStopped at 12/02/17 0102 .  pantoprazole (PROTONIX) injection 40 mg, 40 mg, Intravenous, Q24H, Eubanks, Katalina M, NP, 40 mg at 12/02/17 0102 .  phenylephrine (NEO-SYNEPHRINE) 10 mg in sodium chloride 0.9 % 250 mL (0.04 mg/mL) infusion, 0-200 mcg/min, Intravenous, Titrated, EubankOmar PersonLast Rate: 150 mL/hr at 12/02/17 0932, 100 mcg/min at 12/02/17 0932 .  propofol (DIPRIVAN) 1000 MG/100ML infusion, 0-50 mcg/kg/min, Intravenous, Continuous, Deterding, ElizabGuadelupe SabinLast Rate: 25.4 mL/hr at 12/02/17 0933, 30 mcg/kg/min at 12/02/17 0933 .  senna-docusate (Senokot-S) tablet 1 tablet, 1 tablet, Oral, QHS PRN, Arora,Amie Portland  ticagrelor (BRILINTA) tablet 90 mg, 90 mg, Oral, BID, Deveshwar, Sanjeev, MD .  tirofiban (AGGRASTAT) bolus via infusion 1,000 mcg, 1,000 mcg, Intravenous, Once, DeveshLuanne BrasLabs: CBC Recent Labs    12/01/17 1550 12/01/17 1600 12/02/17 0535  WBC 10.5  --  12.5*  HGB 17.1* 17.3* 14.2  HCT 48.8 51.0 40.8  PLT 243  --  268   BMET Recent Labs    12/01/17 1550 12/01/17 1600 12/02/17 0535  NA 139 142 141  K 3.4* 3.6 3.3*  CL 100* 98* 111  CO2 26  --  21*  GLUCOSE 161* 163* 93  BUN 18 22* 15  CREATININE 1.08 1.00 0.90  CALCIUM 9.0  --  7.3*   LFT Recent Labs    12/01/17 1550  PROT 7.0  ALBUMIN 3.9  AST 39  ALT 51    ALKPHOS 68  BILITOT 2.1*   PT/INR Recent Labs    12/01/17 1550  LABPROT 12.2  INR 0.92     Studies/Results: Ct Angio Head W Or Wo Contrast  Result Date: 12/01/2017 CLINICAL DATA:  Right-sided ataxia/weakness. EXAM: CT ANGIOGRAPHY HEAD AND NECK CT PERFUSION BRAIN TECHNIQUE: Multidetector CT imaging of the head and neck was performed using the standard protocol during bolus administration of intravenous contrast. Multiplanar CT image reconstructions and MIPs were obtained to evaluate the vascular anatomy. Carotid stenosis measurements (when applicable) are obtained utilizing NASCET criteria, using the distal internal carotid diameter as the denominator. Multiphase CT imaging of the brain was performed following IV bolus contrast injection. Subsequent parametric perfusion maps were calculated using RAPID software. CONTRAST:  59m ISOVUE-370 IOPAMIDOL (ISOVUE-370) INJECTION 76% COMPARISON:  Head CT earlier today FINDINGS: CTA NECK FINDINGS Aortic arch: Partially covered and normal-appearing. Three vessel branching. Right carotid system: Smooth and widely patent. No atheromatous changes. Left carotid system: Smooth and widely patent. No atheromatous changes. Tortuosity with partial retropharyngeal course. Vertebral arteries: No proximal subclavian stenosis, atherosclerosis, or irregularity. Codominant vertebral arteries that are smooth and widely patent to the dura. Skeleton: No acute or aggressive finding. Other neck: Bilateral thyroid nodules, 25 mm on the right and subcentimeter on the left. Upper chest: Negative Review of the MIP images confirms the above findings CTA HEAD FINDINGS Anterior circulation: Carotid siphons are smooth and widely patent. No noted atheromatous changes. No branch occlusion or flow limiting stenosis. Posterior circulation: Vertebral and basilar arteries are smooth and widely patent. Inferior cerebellar arteries are not well visualized. There is a left P1 occlusion beginning  at the origin. On coronal reformats the top of the basilar is free of clot. Venous sinuses: Patent Anatomic variants: None significant Delayed phase: Not performed in the emergent setting Review of the MIP images confirms the above findings CT Brain Perfusion Findings: CBF (<30%) Volume: 060mPerfusion (Tmax>6.0s) volume: 1489meen in the left PCA distribution. Artifact from motion. Findings already known to Dr. AroRory Percy time of case opening. IMPRESSION: 1. 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 14 cc of left PCA distribution penumbra with no infarct by CTP. 2. 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 No embolic source identified in the head or neck. No atheromatous changes or chronic stenoses. 3. 2.5 cm right thyroid nodule, recommend sonographic follow-up. Electronically Signed   By: JonMonte FantasiaD.   On: 12/01/2017 16:35   Ct Head Wo Contrast  Result Date: 12/02/2017 CLINICAL DATA:  [age]ar old male with a history of acute stroke, left posterior cerebral artery occlusion, status post thrombectomy and PCA stent placement EXAM: CT HEAD WITHOUT CONTRAST TECHNIQUE: Contiguous axial images were obtained from the base of the skull through the vertex without intravenous contrast. COMPARISON:  CT, CT angiogram, and CT perfusion imaging 12/01/2017, CT 12/01/2017, angiogram 12/01/2017 FINDINGS: Brain: Evolving subarachnoid/cortical density of the left posterior temporal and parietal/occipital region, which is less conspicuous on the current CT  than the prior. No new acute intracranial hemorrhage. No midline shift or mass effect. Endovascular changes of stent placement of the left PCA again noted. Crowding of the sulci in the left parietal/occipital region. Vascular: Stenting of the left PCA. Skull: No acute bony abnormality.  No aggressive bony lesion. Sinuses/Orbits: Small air-fluid level of the right maxillary sinus with trace mucoperiosteal thickening. Minimal opacification of ethmoid air  cells. Frontal sinuses and sphenoid sinuses remain patent. Other: Endotracheal tube remains in place. Mild subluxation of the bilateral TMJ fluid within the nasopharynx. IMPRESSION: No new acute intracranial hemorrhage, mass effect or midline shift. Resolving left posterior temporal and parietooccipital subarachnoid hyperdensity, representing either redistribution of blood products and/or resolving contrast staining. Similar appearance of crowded sulci in the left PCA territory, may represent evolving infarction or a secondary effect of the resolving hyperdensity. Correlation with MRI may be considered. Surgical changes of left PCA stenting. Electronically Signed   By: Corrie Mckusick D.O.   On: 12/02/2017 09:00   Ct Head Wo Contrast  Result Date: 12/01/2017 CLINICAL DATA:  Status post P1 segment PCA thrombectomy EXAM: CT HEAD WITHOUT CONTRAST TECHNIQUE: Contiguous axial images were obtained from the base of the skull through the vertex without intravenous contrast. COMPARISON:  Cerebral angiogram 12/01/2017 Head CT 12/01/2017 CTA head/neck 12/01/2017 FINDINGS: Brain: There is subarachnoid hyperdensity in the posterior left temporal lobe and left occipital lobe. No intraparenchymal hemorrhage. No midline shift or other mass effect. No hydrocephalus. Old right cerebellar stroke. Vascular: Mild intravascular enhancement related to earlier contrast enhanced studies. Skull: Normal visualized skull base, calvarium and extracranial soft tissues. Sinuses/Orbits: No sinus fluid levels or advanced mucosal thickening. No mastoid effusion. Normal orbits. IMPRESSION: 1. Subarachnoid hyperdensity within the posterior left temporal lobe and left occipital lobe, which could be contrast staining or petechial/subarachnoid hemorrhage. 2. No herniation, hydrocephalus or intraparenchymal hematoma. Electronically Signed   By: Ulyses Jarred M.D.   On: 12/01/2017 22:33   Ct Angio Neck W Or Wo Contrast  Result Date:  12/01/2017 CLINICAL DATA:  Right-sided ataxia/weakness. EXAM: CT ANGIOGRAPHY HEAD AND NECK CT PERFUSION BRAIN TECHNIQUE: Multidetector CT imaging of the head and neck was performed using the standard protocol during bolus administration of intravenous contrast. Multiplanar CT image reconstructions and MIPs were obtained to evaluate the vascular anatomy. Carotid stenosis measurements (when applicable) are obtained utilizing NASCET criteria, using the distal internal carotid diameter as the denominator. Multiphase CT imaging of the brain was performed following IV bolus contrast injection. Subsequent parametric perfusion maps were calculated using RAPID software. CONTRAST:  72m ISOVUE-370 IOPAMIDOL (ISOVUE-370) INJECTION 76% COMPARISON:  Head CT earlier today FINDINGS: CTA NECK FINDINGS Aortic arch: Partially covered and normal-appearing. Three vessel branching. Right carotid system: Smooth and widely patent. No atheromatous changes. Left carotid system: Smooth and widely patent. No atheromatous changes. Tortuosity with partial retropharyngeal course. Vertebral arteries: No proximal subclavian stenosis, atherosclerosis, or irregularity. Codominant vertebral arteries that are smooth and widely patent to the dura. Skeleton: No acute or aggressive finding. Other neck: Bilateral thyroid nodules, 25 mm on the right and subcentimeter on the left. Upper chest: Negative Review of the MIP images confirms the above findings CTA HEAD FINDINGS Anterior circulation: Carotid siphons are smooth and widely patent. No noted atheromatous changes. No branch occlusion or flow limiting stenosis. Posterior circulation: Vertebral and basilar arteries are smooth and widely patent. Inferior cerebellar arteries are not well visualized. There is a left P1 occlusion beginning at the origin. On coronal reformats the top of the basilar  is free of clot. Venous sinuses: Patent Anatomic variants: None significant Delayed phase: Not performed in the  emergent setting Review of the MIP images confirms the above findings CT Brain Perfusion Findings: CBF (<30%) Volume: 54m Perfusion (Tmax>6.0s) volume: 174mseen in the left PCA distribution. Artifact from motion. Findings already known to Dr. ArRory Percyt time of case opening. IMPRESSION: 1. 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 14 cc of left PCA distribution penumbra with no infarct by CTP. 2. 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 No embolic source identified in the head or neck. No atheromatous changes or chronic stenoses. 3. 2.5 cm right thyroid nodule, recommend sonographic follow-up. Electronically Signed   By: JoMonte Fantasia.D.   On: 12/01/2017 16:35   Ct Cerebral Perfusion W Contrast  Result Date: 12/01/2017 CLINICAL DATA:  Right-sided ataxia/weakness. EXAM: CT ANGIOGRAPHY HEAD AND NECK CT PERFUSION BRAIN TECHNIQUE: Multidetector CT imaging of the head and neck was performed using the standard protocol during bolus administration of intravenous contrast. Multiplanar CT image reconstructions and MIPs were obtained to evaluate the vascular anatomy. Carotid stenosis measurements (when applicable) are obtained utilizing NASCET criteria, using the distal internal carotid diameter as the denominator. Multiphase CT imaging of the brain was performed following IV bolus contrast injection. Subsequent parametric perfusion maps were calculated using RAPID software. CONTRAST:  9046mSOVUE-370 IOPAMIDOL (ISOVUE-370) INJECTION 76% COMPARISON:  Head CT earlier today FINDINGS: CTA NECK FINDINGS Aortic arch: Partially covered and normal-appearing. Three vessel branching. Right carotid system: Smooth and widely patent. No atheromatous changes. Left carotid system: Smooth and widely patent. No atheromatous changes. Tortuosity with partial retropharyngeal course. Vertebral arteries: No proximal subclavian stenosis, atherosclerosis, or irregularity. Codominant vertebral arteries that are smooth and  widely patent to the dura. Skeleton: No acute or aggressive finding. Other neck: Bilateral thyroid nodules, 25 mm on the right and subcentimeter on the left. Upper chest: Negative Review of the MIP images confirms the above findings CTA HEAD FINDINGS Anterior circulation: Carotid siphons are smooth and widely patent. No noted atheromatous changes. No branch occlusion or flow limiting stenosis. Posterior circulation: Vertebral and basilar arteries are smooth and widely patent. Inferior cerebellar arteries are not well visualized. There is a left P1 occlusion beginning at the origin. On coronal reformats the top of the basilar is free of clot. Venous sinuses: Patent Anatomic variants: None significant Delayed phase: Not performed in the emergent setting Review of the MIP images confirms the above findings CT Brain Perfusion Findings: CBF (<30%) Volume: 0mL75mrfusion (Tmax>6.0s) volume: 14mL77mn in the left PCA distribution. Artifact from motion. Findings already known to Dr. AroraRory Percyime of case opening. IMPRESSION: 1. 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 14 cc of left PCA distribution penumbra with no infarct by CTP. 2. 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 No embolic source identified in the head or neck. No atheromatous changes or chronic stenoses. 3. 2.5 cm right thyroid nodule, recommend sonographic follow-up. Electronically Signed   By: JonatMonte Fantasia   On: 12/01/2017 16:35   Dg Chest Port 1 View  Result Date: 12/01/2017 CLINICAL DATA:  Intubation EXAM: PORTABLE CHEST 1 VIEW COMPARISON:  None. FINDINGS: Endotracheal tube tip at the level of the clavicular heads. Orogastric tube courses beyond the field of view. There is cardiomegaly and pulmonary vascular congestion without overt edema. No focal airspace consolidation. No sizable pleural effusion. IMPRESSION: Radiographically appropriate position of endotracheal tube. Electronically Signed   By: KevinCletus Gash  On:  12/01/2017 23:39   Ct Head Code Stroke Wo Contrast  Result Date: 12/01/2017 CLINICAL DATA:  Code stroke. Right-sided weakness beginning 30-40 minutes ago at the patient's desk. EXAM: CT HEAD WITHOUT CONTRAST TECHNIQUE: Contiguous axial images were obtained from the base of the skull through the vertex without intravenous contrast. COMPARISON:  None. FINDINGS: Brain: Small remote right cerebellar infarct inferiorly. No visible acute infarct. No hemorrhage, masslike finding, or hydrocephalus. Vascular: Concern for hyperdense left P1 segment, see sagittal reformats. No hyperdensity of MCAs. Skull: Negative Sinuses/Orbits: Negative Other: Case reviewed in person with Dr. Rory Percy.  CTA pending ASPECTS [facility] Stroke Program Early CT Score) left hemisphere - Ganglionic level infarction (caudate, lentiform nuclei, internal capsule, insula, M1-M3 cortex): 7 - Supraganglionic infarction (M4-M6 cortex): 3 Total score (0-10 with 10 being normal): 10 IMPRESSION: 1. Possible hyperdense left P1 segment. 2. Negative for hemorrhage or visible acute infarct. 3. Small remote right inferior cerebellar infarct. Electronically Signed   By: Monte Fantasia M.D.   On: 12/01/2017 16:15    Assessment/Plan: S/P bilateral common carotid and vertebral artery angiograms,followed  By partial revascularization of Lt PCA with x 2 passes with solitaire 49m x 40 mm retriever device ,x 1 pass with the embotrap retriever device  And placement of intracranial stent in the Lt PCA for recurrent occlusion after retriever passes achieving a TICI 2b re pertfusion,followe by .75 mg og aggrastat IA into the basilar artery with slow revascularization of intrastent occlusion.due to acute platelet aggregation. Also loaded with brilinta 180 mg and aspirin 81 mg via orogastric tube prior to stent placement  Remains intubated. (R)groin sheath to be removed today, orders in.    LOS: 1 day   I spent a total of 15 minutes in face to face in clinical  consultation, greater than 50% of which was counseling/coordinating care for follow up cerebral intervention  BAscencion DikePA-C 12/02/2017 10:07 AM

## 2017-12-02 NOTE — Progress Notes (Signed)
Pt was transported to CT via ventilator with no complications noted.

## 2017-12-02 NOTE — Progress Notes (Signed)
OT Cancellation Note  Patient Details Name: William Lowery MRN: [id number] DOB: Mar 01, 1966   Cancelled Treatment:    Reason Eval/Treat Not Completed: Patient not medically ready(pt intubated with sheath in place).  Binnie Kand M.S., OTR/L Pager: [phone]  12/02/2017, 10:10 AM

## 2017-12-02 NOTE — Progress Notes (Signed)
PT Cancellation Note  Patient Details Name: William Lowery MRN: [id number] DOB: Oct 18, 1966   Cancelled Treatment:    Reason Eval/Treat Not Completed: Medical issues which prohibited therapy Intubated with sheath.  PT to check back tomorrow.  Thanks,   Barbarann Ehlers. [place], [place], DPT [phone]   12/02/2017, 11:23 AM

## 2017-12-02 NOTE — Progress Notes (Signed)
PULMONARY / CRITICAL CARE MEDICINE   Name: William Lowery MRN: [id number] DOB: 1966-10-01    ADMISSION DATE:  12/01/2017   CHIEF COMPLAINT:  Right sided numbness  HISTORY OF PRESENT ILLNESS:       52 year old male with PMH of HTN, HLD, OSA/OHS with CPAP at HS, Asthma   Presents to ED on 1/26 with numbness to right face, arm, and leg. Reports a few days ago that he had a headache with right eye blurriness, in which resolved on its own. CT Head with Occlusion to Left P1 segment of the PCA. Given TPA. Taken to IR for revascularization. Stent placed in the left PCA, given aggrastat IA, loaded with Brilinta and ASA prior to stent placement. Remained intubated in post-operative state. PCCM asked to consult for vent management.  He is intubated and mechanically ventilated and sedated on propofol at the time of my examination this morning.  He is requiring a moderate dose of Neo-Synephrine for blood pressure support.     PAST MEDICAL HISTORY :  He  has a past medical history of Asthma, Heart murmur, Hypertension, and Sleep apnea.  PAST SURGICAL HISTORY: He  has a past surgical history that includes bellybutton procedure.  No Known Allergies  No current facility-administered medications on file prior to encounter.    Current Outpatient Medications on File Prior to Encounter  Medication Sig  . Cholecalciferol (VITAMIN D PO) Take by mouth daily.  Marland Kitchen losartan-hydrochlorothiazide (HYZAAR) 100-25 MG tablet Take 1 tablet by mouth daily.  . Omega-3 Fatty Acids (FISH OIL PO) Take by mouth daily.  Marland Kitchen VITAMIN E PO Take by mouth daily.    FAMILY HISTORY:  His indicated that the status of his neg hx is unknown.   SOCIAL HISTORY: He  reports that  has never smoked. His smokeless tobacco use includes snuff. He reports that he does not drink alcohol or use drugs.  REVIEW OF SYSTEMS:   Not obtainable  SUBJECTIVE:  As above  VITAL SIGNS: BP 128/69   Pulse 65   Temp 98.4 F (36.9 C)  (Axillary)   Resp 18   Ht 5\' 10"  (1.778 m)   Wt (!) 310 lb 13.6 oz (141 kg)   SpO2 99%   BMI 44.60 kg/m   HEMODYNAMICS:    VENTILATOR SETTINGS: Vent Mode: PRVC FiO2 (%):  [40 %-60 %] 40 % Set Rate:  [16 bmp-18 bmp] 18 bmp Vt Set:  [600 mL] 600 mL PEEP:  [5 cmH20] 5 cmH20 Plateau Pressure:  [19 cmH20-22 cmH20] 20 cmH20  INTAKE / OUTPUT: I/O last 3 completed shifts: In: 2704.5 [I.V.:2704.5] Out: 2150 [Urine:2050; Blood:100]  PHYSICAL EXAMINATION: General: This is an obese middle-aged male who is orally intubated sedated and mechanically ventilated. Neuro: He is not responsive to voice on 30 mcg of propofol.  In response to sternal rub he does not eye open he moves the left extremities purposefully but not the right.  I cannot get him to withdraw either right extremity from pain.  Pupils are equal at 2 mm and EOMs appear to be full. Cardiovascular: S1 and S2 are regular with a harsh crescendo decrescendo murmur best heard at the base. Lungs: He is not breathing above the set ventilator rate, there is symmetric air movement, no wheezes and some scattered rhonchi. Abdomen: The abdomen is overtly obese without any obvious organomegaly masses tenderness guarding or rebound.  There is an arterial sheath in the right groin there is no obvious hematoma and the dressing is  dry. Musculoskeletal: There is no dependent edema.   LABS:  BMET Recent Labs  Lab 12/01/17 1550 12/01/17 1600 12/02/17 0535  NA 139 142 141  K 3.4* 3.6 3.3*  CL 100* 98* 111  CO2 26  --  21*  BUN 18 22* 15  CREATININE 1.08 1.00 0.90  GLUCOSE 161* 163* 93    Electrolytes Recent Labs  Lab 12/01/17 1550 12/02/17 0535  CALCIUM 9.0 7.3*  MG  --  2.0  PHOS  --  3.3    CBC Recent Labs  Lab 12/01/17 1550 12/01/17 1600 12/02/17 0535  WBC 10.5  --  12.5*  HGB 17.1* 17.3* 14.2  HCT 48.8 51.0 40.8  PLT 243  --  268    Coag's Recent Labs  Lab 12/01/17 1550  APTT 27  INR 0.92    Sepsis  Markers No results for input(s): LATICACIDVEN, PROCALCITON, O2SATVEN in the last 168 hours.  ABG Recent Labs  Lab 12/01/17 2251 12/02/17 0440  PHART 7.301* 7.239*  PCO2ART 50.3* 53.5*  PO2ART 164* 118.0*    Liver Enzymes Recent Labs  Lab 12/01/17 1550  AST 39  ALT 51  ALKPHOS 68  BILITOT 2.1*  ALBUMIN 3.9    Cardiac Enzymes No results for input(s): TROPONINI, PROBNP in the last 168 hours.  Glucose No results for input(s): GLUCAP in the last 168 hours.  Imaging Ct Angio Head W Or Wo Contrast  Result Date: 12/01/2017 CLINICAL DATA:  Right-sided ataxia/weakness. EXAM: CT ANGIOGRAPHY HEAD AND NECK CT PERFUSION BRAIN TECHNIQUE: Multidetector CT imaging of the head and neck was performed using the standard protocol during bolus administration of intravenous contrast. Multiplanar CT image reconstructions and MIPs were obtained to evaluate the vascular anatomy. Carotid stenosis measurements (when applicable) are obtained utilizing NASCET criteria, using the distal internal carotid diameter as the denominator. Multiphase CT imaging of the brain was performed following IV bolus contrast injection. Subsequent parametric perfusion maps were calculated using RAPID software. CONTRAST:  12mL ISOVUE-370 IOPAMIDOL (ISOVUE-370) INJECTION 76% COMPARISON:  Head CT earlier today FINDINGS: CTA NECK FINDINGS Aortic arch: Partially covered and normal-appearing. Three vessel branching. Right carotid system: Smooth and widely patent. No atheromatous changes. Left carotid system: Smooth and widely patent. No atheromatous changes. Tortuosity with partial retropharyngeal course. Vertebral arteries: No proximal subclavian stenosis, atherosclerosis, or irregularity. Codominant vertebral arteries that are smooth and widely patent to the dura. Skeleton: No acute or aggressive finding. Other neck: Bilateral thyroid nodules, 25 mm on the right and subcentimeter on the left. Upper chest: Negative Review of the MIP  images confirms the above findings CTA HEAD FINDINGS Anterior circulation: Carotid siphons are smooth and widely patent. No noted atheromatous changes. No branch occlusion or flow limiting stenosis. Posterior circulation: Vertebral and basilar arteries are smooth and widely patent. Inferior cerebellar arteries are not well visualized. There is a left P1 occlusion beginning at the origin. On coronal reformats the top of the basilar is free of clot. Venous sinuses: Patent Anatomic variants: None significant Delayed phase: Not performed in the emergent setting Review of the MIP images confirms the above findings CT Brain Perfusion Findings: CBF (<30%) Volume: 75mL Perfusion (Tmax>6.0s) volume: 86mL-seen in the left PCA distribution. Artifact from motion. Findings already known to Dr. Rory Percy at time of case opening. IMPRESSION: 1. 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 14 cc of left PCA distribution penumbra with no infarct by CTP. 2. 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 No embolic source identified in the head  or neck. No atheromatous changes or chronic stenoses. 3. 2.5 cm right thyroid nodule, recommend sonographic follow-up. Electronically Signed   By: Monte Fantasia M.D.   On: 12/01/2017 16:35   Ct Head Wo Contrast  Result Date: 12/01/2017 CLINICAL DATA:  Status post P1 segment PCA thrombectomy EXAM: CT HEAD WITHOUT CONTRAST TECHNIQUE: Contiguous axial images were obtained from the base of the skull through the vertex without intravenous contrast. COMPARISON:  Cerebral angiogram 12/01/2017 Head CT 12/01/2017 CTA head/neck 12/01/2017 FINDINGS: Brain: There is subarachnoid hyperdensity in the posterior left temporal lobe and left occipital lobe. No intraparenchymal hemorrhage. No midline shift or other mass effect. No hydrocephalus. Old right cerebellar stroke. Vascular: Mild intravascular enhancement related to earlier contrast enhanced studies. Skull: Normal visualized skull base,  calvarium and extracranial soft tissues. Sinuses/Orbits: No sinus fluid levels or advanced mucosal thickening. No mastoid effusion. Normal orbits. IMPRESSION: 1. Subarachnoid hyperdensity within the posterior left temporal lobe and left occipital lobe, which could be contrast staining or petechial/subarachnoid hemorrhage. 2. No herniation, hydrocephalus or intraparenchymal hematoma. Electronically Signed   By: Ulyses Jarred M.D.   On: 12/01/2017 22:33   Ct Angio Neck W Or Wo Contrast  Result Date: 12/01/2017 CLINICAL DATA:  Right-sided ataxia/weakness. EXAM: CT ANGIOGRAPHY HEAD AND NECK CT PERFUSION BRAIN TECHNIQUE: Multidetector CT imaging of the head and neck was performed using the standard protocol during bolus administration of intravenous contrast. Multiplanar CT image reconstructions and MIPs were obtained to evaluate the vascular anatomy. Carotid stenosis measurements (when applicable) are obtained utilizing NASCET criteria, using the distal internal carotid diameter as the denominator. Multiphase CT imaging of the brain was performed following IV bolus contrast injection. Subsequent parametric perfusion maps were calculated using RAPID software. CONTRAST:  63mL ISOVUE-370 IOPAMIDOL (ISOVUE-370) INJECTION 76% COMPARISON:  Head CT earlier today FINDINGS: CTA NECK FINDINGS Aortic arch: Partially covered and normal-appearing. Three vessel branching. Right carotid system: Smooth and widely patent. No atheromatous changes. Left carotid system: Smooth and widely patent. No atheromatous changes. Tortuosity with partial retropharyngeal course. Vertebral arteries: No proximal subclavian stenosis, atherosclerosis, or irregularity. Codominant vertebral arteries that are smooth and widely patent to the dura. Skeleton: No acute or aggressive finding. Other neck: Bilateral thyroid nodules, 25 mm on the right and subcentimeter on the left. Upper chest: Negative Review of the MIP images confirms the above findings CTA  HEAD FINDINGS Anterior circulation: Carotid siphons are smooth and widely patent. No noted atheromatous changes. No branch occlusion or flow limiting stenosis. Posterior circulation: Vertebral and basilar arteries are smooth and widely patent. Inferior cerebellar arteries are not well visualized. There is a left P1 occlusion beginning at the origin. On coronal reformats the top of the basilar is free of clot. Venous sinuses: Patent Anatomic variants: None significant Delayed phase: Not performed in the emergent setting Review of the MIP images confirms the above findings CT Brain Perfusion Findings: CBF (<30%) Volume: 28mL Perfusion (Tmax>6.0s) volume: 7mL-seen in the left PCA distribution. Artifact from motion. Findings already known to Dr. Rory Percy at time of case opening. IMPRESSION: 1. 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 14 cc of left PCA distribution penumbra with no infarct by CTP. 2. 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 No embolic source identified in the head or neck. No atheromatous changes or chronic stenoses. 3. 2.5 cm right thyroid nodule, recommend sonographic follow-up. Electronically Signed   By: Monte Fantasia M.D.   On: 12/01/2017 16:35   Ct Cerebral Perfusion W Contrast  Result Date:  12/01/2017 CLINICAL DATA:  Right-sided ataxia/weakness. EXAM: CT ANGIOGRAPHY HEAD AND NECK CT PERFUSION BRAIN TECHNIQUE: Multidetector CT imaging of the head and neck was performed using the standard protocol during bolus administration of intravenous contrast. Multiplanar CT image reconstructions and MIPs were obtained to evaluate the vascular anatomy. Carotid stenosis measurements (when applicable) are obtained utilizing NASCET criteria, using the distal internal carotid diameter as the denominator. Multiphase CT imaging of the brain was performed following IV bolus contrast injection. Subsequent parametric perfusion maps were calculated using RAPID software. CONTRAST:  52mL ISOVUE-370  IOPAMIDOL (ISOVUE-370) INJECTION 76% COMPARISON:  Head CT earlier today FINDINGS: CTA NECK FINDINGS Aortic arch: Partially covered and normal-appearing. Three vessel branching. Right carotid system: Smooth and widely patent. No atheromatous changes. Left carotid system: Smooth and widely patent. No atheromatous changes. Tortuosity with partial retropharyngeal course. Vertebral arteries: No proximal subclavian stenosis, atherosclerosis, or irregularity. Codominant vertebral arteries that are smooth and widely patent to the dura. Skeleton: No acute or aggressive finding. Other neck: Bilateral thyroid nodules, 25 mm on the right and subcentimeter on the left. Upper chest: Negative Review of the MIP images confirms the above findings CTA HEAD FINDINGS Anterior circulation: Carotid siphons are smooth and widely patent. No noted atheromatous changes. No branch occlusion or flow limiting stenosis. Posterior circulation: Vertebral and basilar arteries are smooth and widely patent. Inferior cerebellar arteries are not well visualized. There is a left P1 occlusion beginning at the origin. On coronal reformats the top of the basilar is free of clot. Venous sinuses: Patent Anatomic variants: None significant Delayed phase: Not performed in the emergent setting Review of the MIP images confirms the above findings CT Brain Perfusion Findings: CBF (<30%) Volume: 21mL Perfusion (Tmax>6.0s) volume: 34mL-seen in the left PCA distribution. Artifact from motion. Findings already known to Dr. Rory Percy at time of case opening. IMPRESSION: 1. 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 14 cc of left PCA distribution penumbra with no infarct by CTP. 2. 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 No embolic source identified in the head or neck. No atheromatous changes or chronic stenoses. 3. 2.5 cm right thyroid nodule, recommend sonographic follow-up. Electronically Signed   By: Monte Fantasia M.D.   On: 12/01/2017 16:35   Dg  Chest Port 1 View  Result Date: 12/01/2017 CLINICAL DATA:  Intubation EXAM: PORTABLE CHEST 1 VIEW COMPARISON:  None. FINDINGS: Endotracheal tube tip at the level of the clavicular heads. Orogastric tube courses beyond the field of view. There is cardiomegaly and pulmonary vascular congestion without overt edema. No focal airspace consolidation. No sizable pleural effusion. IMPRESSION: Radiographically appropriate position of endotracheal tube. Electronically Signed   By: Ulyses Jarred M.D.   On: 12/01/2017 23:39   Ct Head Code Stroke Wo Contrast  Result Date: 12/01/2017 CLINICAL DATA:  Code stroke. Right-sided weakness beginning 30-40 minutes ago at the patient's desk. EXAM: CT HEAD WITHOUT CONTRAST TECHNIQUE: Contiguous axial images were obtained from the base of the skull through the vertex without intravenous contrast. COMPARISON:  None. FINDINGS: Brain: Small remote right cerebellar infarct inferiorly. No visible acute infarct. No hemorrhage, masslike finding, or hydrocephalus. Vascular: Concern for hyperdense left P1 segment, see sagittal reformats. No hyperdensity of MCAs. Skull: Negative Sinuses/Orbits: Negative Other: Case reviewed in person with Dr. Rory Percy.  CTA pending ASPECTS [facility] Stroke Program Early CT Score) left hemisphere - Ganglionic level infarction (caudate, lentiform nuclei, internal capsule, insula, M1-M3 cortex): 7 - Supraganglionic infarction (M4-M6 cortex): 3 Total score (0-10 with 10 being  normal): 10 IMPRESSION: 1. Possible hyperdense left P1 segment. 2. Negative for hemorrhage or visible acute infarct. 3. Small remote right inferior cerebellar infarct. Electronically Signed   By: Monte Fantasia M.D.   On: 12/01/2017 16:15     DISCUSSION:      This is a 52 year old with a history of obstructive sleep apnea hypertension and intermittent atrial fibrillation who presented with right-sided numbness.  He was given TPA and subsequently underwent stenting of the left PCA.  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  Stat CT scan of the head is pending.  ASSESSMENT / PLAN:  PULMONARY A: He was hypercarbic this morning and appropriate ventilator adjustments have been made.  No intention of moving towards extubation until we have demonstrated a stable central nervous system picture.  I am anxious to avoid hypercapnia in this setting and anticipate some difficulty in separating the patient from mechanical ventilation based on his body habitus alone.  CARDIOVASCULAR A: He has a history of intermittent atrial fibrillation but is currently in normal sinus rhythm.  We will need to discuss long-term anticoagulation goals with neurology.  He also has a very loud systolic ejection murmur which will be evaluated with his echocardiogram.  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  His exam appears to have progressed this morning and a stat CT scan has been ordered. I am leaving the adjustment of antiplatelet and anticoagulant agents to neurology as well as the addition of a statin.  32 minutes has been spent in the care of this patient today  Lars Masson, MD Pulmonary and [place] Pager: [phone]  12/02/2017, 7:19 AM

## 2017-12-03 ENCOUNTER — Inpatient Hospital Stay (HOSPITAL_COMMUNITY): Payer: Self-pay

## 2017-12-03 ENCOUNTER — Encounter (HOSPITAL_COMMUNITY): Payer: Self-pay | Admitting: Radiology

## 2017-12-03 DIAGNOSIS — J9601 Acute respiratory failure with hypoxia: Secondary | ICD-10-CM

## 2017-12-03 DIAGNOSIS — I63432 Cerebral infarction due to embolism of left posterior cerebral artery: Secondary | ICD-10-CM

## 2017-12-03 DIAGNOSIS — I63443 Cerebral infarction due to embolism of bilateral cerebellar arteries: Secondary | ICD-10-CM

## 2017-12-03 LAB — CBC WITH DIFFERENTIAL/PLATELET
Basophils Absolute: 0 10*3/uL (ref 0.0–0.1)
Basophils Relative: 0 %
Eosinophils Absolute: 0.2 10*3/uL (ref 0.0–0.7)
Eosinophils Relative: 2 %
HEMATOCRIT: 40.8 % (ref 39.0–52.0)
HEMOGLOBIN: 13.9 g/dL (ref 13.0–17.0)
LYMPHS ABS: 1.4 10*3/uL (ref 0.7–4.0)
LYMPHS PCT: 11 %
MCH: 31.2 pg (ref 26.0–34.0)
MCHC: 34.1 g/dL (ref 30.0–36.0)
MCV: 91.7 fL (ref 78.0–100.0)
MONOS PCT: 5 %
Monocytes Absolute: 0.7 10*3/uL (ref 0.1–1.0)
NEUTROS ABS: 10.3 10*3/uL — AB (ref 1.7–7.7)
NEUTROS PCT: 82 %
Platelets: 196 10*3/uL (ref 150–400)
RBC: 4.45 MIL/uL (ref 4.22–5.81)
RDW: 13.5 % (ref 11.5–15.5)
WBC: 12.6 10*3/uL — AB (ref 4.0–10.5)

## 2017-12-03 LAB — GLUCOSE, CAPILLARY
GLUCOSE-CAPILLARY: 115 mg/dL — AB (ref 65–99)
Glucose-Capillary: 133 mg/dL — ABNORMAL HIGH (ref 65–99)

## 2017-12-03 LAB — BLOOD GAS, ARTERIAL
Acid-base deficit: 0.9 mmol/L (ref 0.0–2.0)
Bicarbonate: 24.3 mmol/L (ref 20.0–28.0)
DRAWN BY: 347621
FIO2: 40
LHR: 18 {breaths}/min
MECHVT: 600 mL
O2 SAT: 96.6 %
PCO2 ART: 50.1 mmHg — AB (ref 32.0–48.0)
PEEP: 5 cmH2O
PO2 ART: 94.4 mmHg (ref 83.0–108.0)
Patient temperature: 99.8
pH, Arterial: 7.312 — ABNORMAL LOW (ref 7.350–7.450)

## 2017-12-03 LAB — BASIC METABOLIC PANEL
ANION GAP: 9 (ref 5–15)
BUN: 8 mg/dL (ref 6–20)
CHLORIDE: 110 mmol/L (ref 101–111)
CO2: 21 mmol/L — AB (ref 22–32)
Calcium: 7.6 mg/dL — ABNORMAL LOW (ref 8.9–10.3)
Creatinine, Ser: 0.88 mg/dL (ref 0.61–1.24)
GFR calc non Af Amer: >60 mL/min (ref >60)
GLUCOSE: 84 mg/dL (ref 65–99)
Potassium: 3.5 mmol/L (ref 3.5–5.1)
Sodium: 140 mmol/L (ref 135–145)

## 2017-12-03 LAB — MAGNESIUM
Magnesium: 1.9 mg/dL (ref 1.7–2.4)
Magnesium: 1.9 mg/dL (ref 1.7–2.4)

## 2017-12-03 LAB — PHOSPHORUS: Phosphorus: 3.2 mg/dL (ref 2.5–4.6)

## 2017-12-03 MED ORDER — FUROSEMIDE 10 MG/ML IJ SOLN
40.0000 mg | Freq: Once | INTRAMUSCULAR | Status: AC
  Administered 2017-12-03: 40 mg via INTRAVENOUS
  Filled 2017-12-03: qty 4

## 2017-12-03 MED ORDER — VITAL HIGH PROTEIN PO LIQD
1000.0000 mL | ORAL | Status: DC
  Administered 2017-12-03: 1000 mL

## 2017-12-03 MED ORDER — VITAL HIGH PROTEIN PO LIQD: 1000.0000 mL | ORAL | Status: DC

## 2017-12-03 MED ORDER — PRO-STAT SUGAR FREE PO LIQD
30.0000 mL | Freq: Two times a day (BID) | ORAL | Status: DC
  Administered 2017-12-03: 30 mL

## 2017-12-03 MED ORDER — PRO-STAT SUGAR FREE PO LIQD
60.0000 mL | Freq: Every day | ORAL | Status: DC
  Filled 2017-12-03: qty 60

## 2017-12-03 MED ORDER — POTASSIUM CHLORIDE 20 MEQ/15ML (10%) PO SOLN
40.0000 meq | Freq: Once | ORAL | Status: AC
  Administered 2017-12-03: 40 meq
  Filled 2017-12-03: qty 30

## 2017-12-03 MED ORDER — ONDANSETRON HCL 4 MG/2ML IJ SOLN
4.0000 mg | Freq: Four times a day (QID) | INTRAMUSCULAR | Status: DC | PRN
  Administered 2017-12-03: 4 mg via INTRAVENOUS
  Filled 2017-12-03: qty 2

## 2017-12-03 MED ORDER — PRO-STAT SUGAR FREE PO LIQD: 60.0000 mL | Freq: Three times a day (TID) | ORAL | Status: DC

## 2017-12-03 MED ORDER — FENTANYL CITRATE (PF) 100 MCG/2ML IJ SOLN: 25.0000 ug | INTRAMUSCULAR | Status: DC | PRN

## 2017-12-03 MED ORDER — PANTOPRAZOLE SODIUM 40 MG PO TBEC
40.0000 mg | DELAYED_RELEASE_TABLET | Freq: Every day | ORAL | Status: DC
  Administered 2017-12-04 – 2017-12-06 (×3): 40 mg via ORAL
  Filled 2017-12-03 (×3): qty 1

## 2017-12-03 MED ORDER — DEXMEDETOMIDINE HCL IN NACL 200 MCG/50ML IV SOLN
0.4000 ug/kg/h | INTRAVENOUS | Status: DC
Start: 2017-12-03 — End: 2017-12-04
  Administered 2017-12-03: 0.4 ug/kg/h via INTRAVENOUS
  Administered 2017-12-03: 0.8 ug/kg/h via INTRAVENOUS
  Filled 2017-12-03: qty 50

## 2017-12-03 NOTE — Progress Notes (Signed)
PULMONARY / CRITICAL CARE MEDICINE   Name: William Lowery MRN: [id number] DOB: [birth year]    ADMISSION DATE:  12/01/2017 CHIEF COMPLAINT:  Right sided numbness  HISTORY OF PRESENT ILLNESS:       52 year old male with PMH of HTN, HLD, OSA/OHS with CPAP at HS, Asthma Presented to ED on 1/26 with numbness to right face, arm, and leg. Reports a few days ago that he had a headache with right eye blurriness which resolved on its own. CT Head with Occlusion to Left P1 segment of the PCA. Given TPA. Taken to IR for revascularization. Stent placed in the left PCA, given aggrastat IA, loaded with Brilinta and ASA prior to stent placement. Remained intubated post IR procedure and PCCM consulted for vent management.     SUBJECTIVE:  Failed SBT this am - apneic initially then quickly agitated off sedation per RN.    VITAL SIGNS: BP (!) 148/85   Pulse 76   Temp 99.9 F (37.7 C) (Axillary)   Resp 18   Ht 5\' 10"  (1.778 m)   Wt (!) 141 kg (310 lb 13.6 oz)   SpO2 100%   BMI 44.60 kg/m   HEMODYNAMICS:    VENTILATOR SETTINGS: Vent Mode: PRVC FiO2 (%):  [40 %] 40 % Set Rate:  [18 bmp] 18 bmp Vt Set:  [600 mL] 600 mL PEEP:  [5 cmH20] 5 cmH20 Plateau Pressure:  [14 cmH20-23 cmH20] 14 cmH20  INTAKE / OUTPUT: I/O last 3 completed shifts: In: 8949.1 [I.V.:8949.1] Out: 4200 [Urine:3800; Emesis/NG output:300; Blood:100]  PHYSICAL EXAMINATION: General:  Obese male, NAD on vent  Neuro:  Sedated, RASS -2, easily agitated on WUA per RN, R sided weakness  HEENT:  Mm moist, ETT, huge beard  Cardiovascular:  s1s2 rrr, +m Lungs:  resps even non labored on vent, few scattered rhonchi, diminished abses  Abdomen:  Obese, soft, +bs  Musculoskeletal:  Warm and dry, scant BLE edema   LABS:  BMET Recent Labs  Lab 12/01/17 1550 12/01/17 1600 12/02/17 0535 12/02/17 1600 12/03/17 0422  NA 139 142 141  --  140  K 3.4* 3.6 3.3* 3.7 3.5  CL 100* 98* 111  --  110  CO2 26  --  21*  --  21*  BUN 18 22*  15  --  8  CREATININE 1.08 1.00 0.90  --  0.88  GLUCOSE 161* 163* 93  --  84    Electrolytes Recent Labs  Lab 12/01/17 1550 12/02/17 0535 12/03/17 0422  CALCIUM 9.0 7.3* 7.6*  MG  --  2.0 1.9  PHOS  --  3.3  --     CBC Recent Labs  Lab 12/01/17 1550 12/01/17 1600 12/02/17 0535 12/03/17 0422  WBC 10.5  --  12.5* 12.6*  HGB 17.1* 17.3* 14.2 13.9  HCT 48.8 51.0 40.8 40.8  PLT 243  --  268 196    Coag's Recent Labs  Lab 12/01/17 1550  APTT 27  INR 0.92    Sepsis Markers No results for input(s): LATICACIDVEN, PROCALCITON, O2SATVEN in the last 168 hours.  ABG Recent Labs  Lab 12/01/17 2251 12/02/17 0440 12/03/17 0350  PHART 7.301* 7.239* 7.312*  PCO2ART 50.3* 53.5* 50.1*  PO2ART 164* 118.0* 94.4    Liver Enzymes Recent Labs  Lab 12/01/17 1550  AST 39  ALT 51  ALKPHOS 68  BILITOT 2.1*  ALBUMIN 3.9    Cardiac Enzymes No results for input(s): TROPONINI, PROBNP in the last 168 hours.  Glucose No  results for input(s): GLUCAP in the last 168 hours.  Imaging Ct Head Wo Contrast  Result Date: 12/02/2017 CLINICAL DATA:  Stroke follow-up.  24 hours post tPA administration. EXAM: CT HEAD WITHOUT CONTRAST TECHNIQUE: Contiguous axial images were obtained from the base of the skull through the vertex without intravenous contrast. COMPARISON:  Earlier today FINDINGS: Brain: Moderate Cytotoxic edema in inferior left occipital lobe that appears stable from prior. Small remote appearing right inferior and left superior cerebellar infarcts. Question small new right superior cerebellar infarct. Subarachnoid high-density in the posterior left cerebral sulci continues to diminish. No hydrocephalus. Vascular: Distal basilar to left PCA stent. Skull: Nonspecific scalp edema dependently. Sinuses/Orbits: Progressive nasal cavity opacification. The patient is intubated with nasopharyngeal and sinus fluid levels. Bilateral sinusitis. IMPRESSION: 1. Continued decrease in  subarachnoid high-density along the posterior left cerebral hemisphere. No hydrocephalus. 2. Moderate acute left occipital infarct without detected progression. 3. Question small right superior cerebellar infarct. Electronically Signed   By: Monte Fantasia M.D.   On: 12/02/2017 16:20   Mr Jodene Nam Head Wo Contrast  Result Date: 12/03/2017 CLINICAL DATA:  Stroke.  Status post tPA administration. EXAM: MRI HEAD WITHOUT CONTRAST MRA HEAD WITHOUT CONTRAST TECHNIQUE: Multiplanar, multiecho pulse sequences of the brain and surrounding structures were obtained without intravenous contrast. Angiographic images of the head were obtained using MRA technique without contrast. COMPARISON:  None. Head CT 12/02/2017 FINDINGS: MRI HEAD FINDINGS Brain: The midline structures are normal. There are multiple areas abnormal diffusion restriction. There are many scattered lesions in the cerebellum. The largest area of ischemia is in the left PCA territory. There also foci of ischemia in the left thalamus. There is multifocal cytotoxic edema corresponding to the areas of ischemia. No mass lesion, hydrocephalus, dural abnormality or extra-axial collection. There is also an old right cerebellar infarct. No age-advanced or lobar predominant atrophy. Minimal susceptibility abnormality of the left occipital lobe. There is blooming at the site of the left PCA stent. Skull and upper cervical spine: The visualized skull base, calvarium, upper cervical spine and extracranial soft tissues are normal. Sinuses/Orbits: No fluid levels or advanced mucosal thickening. No mastoid or middle ear effusion. Normal orbits. MRA HEAD FINDINGS Intracranial internal carotid arteries: Normal. Anterior cerebral arteries: Normal. Middle cerebral arteries: Normal. Posterior communicating arteries: Present bilaterally. Posterior cerebral arteries: There is a stent within the P1 segment of the left posterior cerebral artery that extends into the mid P 2 segment. There  is no flow related enhancement seen beyond the extent of the stent. Basilar artery: Normal. Vertebral arteries: Left dominant. Normal. Superior cerebellar arteries: Normal. Anterior inferior cerebellar arteries: Normal. Posterior inferior cerebellar arteries: Normal. IMPRESSION: 1. Multifocal acute ischemia within the posterior circulation territories, predominantly within the left PCA distribution, including the left occipital lobe and left thalamus. Numerous smaller foci of acute ischemia throughout both cerebellar hemispheres. 2. Minimal susceptibility blooming in the left occipital lobe, likely indicating the presence of a minimal amount of petechial blood. 3. Stent within the P1 segment of the left PCA without flow related enhancement distal to the stent. The stent may be occluded. This could be further assessed with CTA of the head, if clinically warranted. Electronically Signed   By: Ulyses Jarred M.D.   On: 12/03/2017 03:10   Mr Brain Wo Contrast  Result Date: 12/03/2017 CLINICAL DATA:  Stroke.  Status post tPA administration. EXAM: MRI HEAD WITHOUT CONTRAST MRA HEAD WITHOUT CONTRAST TECHNIQUE: Multiplanar, multiecho pulse sequences of the brain and surrounding structures were obtained  without intravenous contrast. Angiographic images of the head were obtained using MRA technique without contrast. COMPARISON:  None. Head CT 12/02/2017 FINDINGS: MRI HEAD FINDINGS Brain: The midline structures are normal. There are multiple areas abnormal diffusion restriction. There are many scattered lesions in the cerebellum. The largest area of ischemia is in the left PCA territory. There also foci of ischemia in the left thalamus. There is multifocal cytotoxic edema corresponding to the areas of ischemia. No mass lesion, hydrocephalus, dural abnormality or extra-axial collection. There is also an old right cerebellar infarct. No age-advanced or lobar predominant atrophy. Minimal susceptibility abnormality of the left  occipital lobe. There is blooming at the site of the left PCA stent. Skull and upper cervical spine: The visualized skull base, calvarium, upper cervical spine and extracranial soft tissues are normal. Sinuses/Orbits: No fluid levels or advanced mucosal thickening. No mastoid or middle ear effusion. Normal orbits. MRA HEAD FINDINGS Intracranial internal carotid arteries: Normal. Anterior cerebral arteries: Normal. Middle cerebral arteries: Normal. Posterior communicating arteries: Present bilaterally. Posterior cerebral arteries: There is a stent within the P1 segment of the left posterior cerebral artery that extends into the mid P 2 segment. There is no flow related enhancement seen beyond the extent of the stent. Basilar artery: Normal. Vertebral arteries: Left dominant. Normal. Superior cerebellar arteries: Normal. Anterior inferior cerebellar arteries: Normal. Posterior inferior cerebellar arteries: Normal. IMPRESSION: 1. Multifocal acute ischemia within the posterior circulation territories, predominantly within the left PCA distribution, including the left occipital lobe and left thalamus. Numerous smaller foci of acute ischemia throughout both cerebellar hemispheres. 2. Minimal susceptibility blooming in the left occipital lobe, likely indicating the presence of a minimal amount of petechial blood. 3. Stent within the P1 segment of the left PCA without flow related enhancement distal to the stent. The stent may be occluded. This could be further assessed with CTA of the head, if clinically warranted. Electronically Signed   By: Ulyses Jarred M.D.   On: 12/03/2017 03:10     STUDIES:  CT head 1/26>>> 1. Possible hyperdense left P1 segment. 2. Negative for hemorrhage or visible acute infarct. 3. Small remote right inferior cerebellar infarct. CT head 1/27>>> 1. Continued decrease in subarachnoid high-density along the posterior left cerebral hemisphere. No hydrocephalus. 2. Moderate acute left  occipital infarct without detected progression. 3. Question small right superior cerebellar infarct. 2D echo 1/27>>> MRI brain 1/28>>> 1. Multifocal acute ischemia within the posterior circulation territories, predominantly within the left PCA distribution, including the left occipital lobe and left thalamus. Numerous smaller foci of acute ischemia throughout both cerebellar hemispheres. 2. Minimal susceptibility blooming in the left occipital lobe, likely indicating the presence of a minimal amount of petechial blood. 3. Stent within the P1 segment of the left PCA without flow related enhancement distal to the stent. The stent may be occluded. This could be further assessed with CTA of the head, if clinically warranted.  CULTURES:   ANTIBIOTICS:   SIGNIFICANT EVENTS: 1/26 IR revascularization of L PCA stroke   LINES/TUBES: ETT 1/26>>> R groin sheath 1/26>>>1/27  DISCUSSION: 52yo male with L PCA stroke s/p systemic TPA and IR stenting with post procedure vent needs.   ASSESSMENT / PLAN:  PULMONARY Acute respiratory failure - initially intubated for neuro IR procedure but having difficulty weaning mostly r/t mental status  Hx OSA/OHS  P:   Vent support - 8cc/kg  F/u CXR now  F/u ABG daily SBT  will need qhs pos pressure post extubation  Adjust sedation as  below Will attempt SBT again later today - discussed with RT  CARDIOVASCULAR A:  Hx PAF  HTN  murmur P:  Echo pending  cardene gtt is off - Permissive HTN (OK if < 180/105) but gradually normalize in 5-7 days Hold home hyzaar  Lasix 40mg  IV x 1   RENAL No active issue  P:   F/u chem  Lasix as above  Strict I/o   GASTROINTESTINAL No active issue  P:   Start TF 1/28 PPI   HEMATOLOGIC No active issue  S/p systemic TPA 1/26 P:  SQ heparin  F/u CBC   INFECTIOUS No active issue  P:   Monitor wbc, fever curve off abx VAP prevention   ENDOCRINE No active issue  P:   Monitor glucose on  chem   NEUROLOGIC L PCA stroke s/p TPA and IR stenting - ?worsening hemiplegia 1/27.  MRI as above from 1/28 with concern for stent occlusion  AMS  P:   RASS goal: -1 Will switch to precedex to assist with vent wean  Notified stroke team of MRI report  Diona Fanti, brilinta  Echo pending  Pt/ot once off vent  Per stroke team     FAMILY  - Updates: no family available 1/28  - Inter-disciplinary family meet or Palliative Care meeting due by:  Day 7     Nickolas Madrid, NP 12/03/2017  9:11 AM Pager: [phone] or [phone]

## 2017-12-03 NOTE — Progress Notes (Signed)
Initial Nutrition Assessment  DOCUMENTATION CODES:   Morbid obesity  INTERVENTION:   Vital High Protein @ 50 ml/hr (1200 ml/day) 60 ml Prostat TID  Provides: 1800 kcal, 195 grams protein, and 1003 ml free water.   NUTRITION DIAGNOSIS:   Inadequate oral intake related to inability to eat as evidenced by NPO status.  GOAL:   Provide needs based on ASPEN/SCCM guidelines  MONITOR:   Vent status, TF tolerance, I & O's  REASON FOR ASSESSMENT:   Consult, Ventilator Enteral/tube feeding initiation and management  ASSESSMENT:   Pt with PMH of HTN, HLD, OSA/OHS with CPAP at HD, asthma admitted with L PCA stroke s/p tPA and IR stent.    Pt discussed during ICU rounds and with RN.   Patient is currently intubated on ventilator support Spoke with mom and wife of patient. They report no recent weight changes and good appetite PTA.   Propofol: 24.6 ml/hr now off Medications reviewed and include: lasix, 40 mEq KCl x 1 Labs reviewed MAP 80-85   NUTRITION - FOCUSED PHYSICAL EXAM:    Most Recent Value  Orbital Region  No depletion  Upper Arm Region  No depletion  Thoracic and Lumbar Region  No depletion  Buccal Region  No depletion  Temple Region  No depletion  Clavicle Bone Region  No depletion  Clavicle and Acromion Bone Region  No depletion  Scapular Bone Region  No depletion  Dorsal Hand  No depletion  Patellar Region  No depletion  Anterior Thigh Region  No depletion  Posterior Calf Region  No depletion  Edema (RD Assessment)  Mild  Hair  Reviewed  Eyes  Unable to assess  Mouth  Unable to assess  Skin  Reviewed  Nails  Reviewed       Diet Order:  Fall precautions Diet NPO time specified  EDUCATION NEEDS:   No education needs have been identified at this time  Skin:  Skin Assessment: Reviewed RN Assessment  Last BM:  unknown  Height:   Ht Readings from Last 1 Encounters:  12/01/17 5\' 10"  (1.778 m)    Weight:   Wt Readings from Last 1  Encounters:  12/01/17 (!) 310 lb 13.6 oz (141 kg)    Ideal Body Weight:  75.4 kg  BMI:  Body mass index is 44.6 kg/m.  Estimated Nutritional Needs:   Kcal:  2409-7353  Protein:  188 grams   Fluid:  >1.5 L/day  Maylon Peppers RD, LDN, CNSC [phone] Pager [phone] After Hours Pager

## 2017-12-03 NOTE — Progress Notes (Signed)
Referring Physician(s): Dr Lavera Guise  Supervising Physician: Luanne Bras  Patient Status:  [facility] - In-pt  Chief Complaint:  CVA  Subjective:  1/26: S/P bilateral common carotid and vertebral artery angiograms,followed  By partial revascularization of Lt PCA with x 2 passes with solitaire 47mm x 40 mm retriever device ,x 1 pass with the embotrap retriever device  And placement of intracranial stent in the Lt PCA for recurrent occlusion after retriever passes achieving a TICI 2b re pertfusion,followe by .75 mg og aggrastat IA into the basilar artery with slow revascularization of intrastent occlusion.due to acute platelet aggregation. Also loaded with brilinta 180 mg and aspirin 81 mg via orogastric tube prior to stent placement  Still intubated Soon to extubate per RN Following most commands   Allergies: Patient has no known allergies.  Medications: Prior to Admission medications   Medication Sig Start Date End Date Taking? Authorizing Provider  B Complex Vitamins (B-COMPLEX/B-12) TABS Take 1 tablet by mouth daily.   Yes [provider]  Cholecalciferol (VITAMIN D PO) Take by mouth daily.   Yes [provider]  losartan-hydrochlorothiazide (HYZAAR) 100-25 MG tablet Take 1 tablet by mouth daily. 09/09/17  Yes [provider]  Omega-3 Fatty Acids (FISH OIL PO) Take by mouth daily.   Yes [provider]  VITAMIN E PO Take by mouth daily.   Yes [provider]     Vital Signs: BP (!) 146/80   Pulse 87   Temp (!) 100.4 F (38 C) (Axillary)   Resp 12   Ht 5\' 10"  (1.778 m)   Wt (!) 310 lb 13.6 oz (141 kg)   SpO2 99%   BMI 44.60 kg/m   Physical Exam  Eyes:  Opens eyes-- tries to follow me  Pulmonary/Chest:  vent  Abdominal: Soft.  Musculoskeletal:  Moves all 4s to command Rt hand is slow  Skin: Skin is warm and dry.  Right groin site is clean and dry NT no bleeding 2+ pulses   Nursing note and vitals  reviewed.   Imaging: Ct Angio Head W Or Wo Contrast  Result Date: 12/01/2017 CLINICAL DATA:  Right-sided ataxia/weakness. EXAM: CT ANGIOGRAPHY HEAD AND NECK CT PERFUSION BRAIN TECHNIQUE: Multidetector CT imaging of the head and neck was performed using the standard protocol during bolus administration of intravenous contrast. Multiplanar CT image reconstructions and MIPs were obtained to evaluate the vascular anatomy. Carotid stenosis measurements (when applicable) are obtained utilizing NASCET criteria, using the distal internal carotid diameter as the denominator. Multiphase CT imaging of the brain was performed following IV bolus contrast injection. Subsequent parametric perfusion maps were calculated using RAPID software. CONTRAST:  65mL ISOVUE-370 IOPAMIDOL (ISOVUE-370) INJECTION 76% COMPARISON:  Head CT earlier today FINDINGS: CTA NECK FINDINGS Aortic arch: Partially covered and normal-appearing. Three vessel branching. Right carotid system: Smooth and widely patent. No atheromatous changes. Left carotid system: Smooth and widely patent. No atheromatous changes. Tortuosity with partial retropharyngeal course. Vertebral arteries: No proximal subclavian stenosis, atherosclerosis, or irregularity. Codominant vertebral arteries that are smooth and widely patent to the dura. Skeleton: No acute or aggressive finding. Other neck: Bilateral thyroid nodules, 25 mm on the right and subcentimeter on the left. Upper chest: Negative Review of the MIP images confirms the above findings CTA HEAD FINDINGS Anterior circulation: Carotid siphons are smooth and widely patent. No noted atheromatous changes. No branch occlusion or flow limiting stenosis. Posterior circulation: Vertebral and basilar arteries are smooth and widely patent. Inferior cerebellar arteries are not  well visualized. There is a left P1 occlusion beginning at the origin. On coronal reformats the top of the basilar is free of clot. Venous sinuses: Patent  Anatomic variants: None significant Delayed phase: Not performed in the emergent setting Review of the MIP images confirms the above findings CT Brain Perfusion Findings: CBF (<30%) Volume: 52mL Perfusion (Tmax>6.0s) volume: 63mL-seen in the left PCA distribution. Artifact from motion. Findings already known to Dr. Rory Percy at time of case opening. IMPRESSION: 1. 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 14 cc of left PCA distribution penumbra with no infarct by CTP. 2. 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 No embolic source identified in the head or neck. No atheromatous changes or chronic stenoses. 3. 2.5 cm right thyroid nodule, recommend sonographic follow-up. Electronically Signed   By: Monte Fantasia M.D.   On: 12/01/2017 16:35   Ct Head Wo Contrast  Result Date: 12/02/2017 CLINICAL DATA:  Stroke follow-up.  24 hours post tPA administration. EXAM: CT HEAD WITHOUT CONTRAST TECHNIQUE: Contiguous axial images were obtained from the base of the skull through the vertex without intravenous contrast. COMPARISON:  Earlier today FINDINGS: Brain: Moderate Cytotoxic edema in inferior left occipital lobe that appears stable from prior. Small remote appearing right inferior and left superior cerebellar infarcts. Question small new right superior cerebellar infarct. Subarachnoid high-density in the posterior left cerebral sulci continues to diminish. No hydrocephalus. Vascular: Distal basilar to left PCA stent. Skull: Nonspecific scalp edema dependently. Sinuses/Orbits: Progressive nasal cavity opacification. The patient is intubated with nasopharyngeal and sinus fluid levels. Bilateral sinusitis. IMPRESSION: 1. Continued decrease in subarachnoid high-density along the posterior left cerebral hemisphere. No hydrocephalus. 2. Moderate acute left occipital infarct without detected progression. 3. Question small right superior cerebellar infarct. Electronically Signed   By: Monte Fantasia M.D.   On:  12/02/2017 16:20   Ct Head Wo Contrast  Result Date: 12/02/2017 CLINICAL DATA:  52 year old male with a history of acute stroke, left posterior cerebral artery occlusion, status post thrombectomy and PCA stent placement EXAM: CT HEAD WITHOUT CONTRAST TECHNIQUE: Contiguous axial images were obtained from the base of the skull through the vertex without intravenous contrast. COMPARISON:  CT, CT angiogram, and CT perfusion imaging 12/01/2017, CT 12/01/2017, angiogram 12/01/2017 FINDINGS: Brain: Evolving subarachnoid/cortical density of the left posterior temporal and parietal/occipital region, which is less conspicuous on the current CT than the prior. No new acute intracranial hemorrhage. No midline shift or mass effect. Endovascular changes of stent placement of the left PCA again noted. Crowding of the sulci in the left parietal/occipital region. Vascular: Stenting of the left PCA. Skull: No acute bony abnormality.  No aggressive bony lesion. Sinuses/Orbits: Small air-fluid level of the right maxillary sinus with trace mucoperiosteal thickening. Minimal opacification of ethmoid air cells. Frontal sinuses and sphenoid sinuses remain patent. Other: Endotracheal tube remains in place. Mild subluxation of the bilateral TMJ fluid within the nasopharynx. IMPRESSION: No new acute intracranial hemorrhage, mass effect or midline shift. Resolving left posterior temporal and parietooccipital subarachnoid hyperdensity, representing either redistribution of blood products and/or resolving contrast staining. Similar appearance of crowded sulci in the left PCA territory, may represent evolving infarction or a secondary effect of the resolving hyperdensity. Correlation with MRI may be considered. Surgical changes of left PCA stenting. Electronically Signed   By: Corrie Mckusick D.O.   On: 12/02/2017 09:00   Ct Head Wo Contrast  Result Date: 12/01/2017 CLINICAL DATA:  Status post P1 segment PCA thrombectomy EXAM: CT HEAD  WITHOUT CONTRAST TECHNIQUE: Contiguous axial images were obtained from the base of the skull through the vertex without intravenous contrast. COMPARISON:  Cerebral angiogram 12/01/2017 Head CT 12/01/2017 CTA head/neck 12/01/2017 FINDINGS: Brain: There is subarachnoid hyperdensity in the posterior left temporal lobe and left occipital lobe. No intraparenchymal hemorrhage. No midline shift or other mass effect. No hydrocephalus. Old right cerebellar stroke. Vascular: Mild intravascular enhancement related to earlier contrast enhanced studies. Skull: Normal visualized skull base, calvarium and extracranial soft tissues. Sinuses/Orbits: No sinus fluid levels or advanced mucosal thickening. No mastoid effusion. Normal orbits. IMPRESSION: 1. Subarachnoid hyperdensity within the posterior left temporal lobe and left occipital lobe, which could be contrast staining or petechial/subarachnoid hemorrhage. 2. No herniation, hydrocephalus or intraparenchymal hematoma. Electronically Signed   By: Ulyses Jarred M.D.   On: 12/01/2017 22:33   Ct Angio Neck W Or Wo Contrast  Result Date: 12/01/2017 CLINICAL DATA:  Right-sided ataxia/weakness. EXAM: CT ANGIOGRAPHY HEAD AND NECK CT PERFUSION BRAIN TECHNIQUE: Multidetector CT imaging of the head and neck was performed using the standard protocol during bolus administration of intravenous contrast. Multiplanar CT image reconstructions and MIPs were obtained to evaluate the vascular anatomy. Carotid stenosis measurements (when applicable) are obtained utilizing NASCET criteria, using the distal internal carotid diameter as the denominator. Multiphase CT imaging of the brain was performed following IV bolus contrast injection. Subsequent parametric perfusion maps were calculated using RAPID software. CONTRAST:  42mL ISOVUE-370 IOPAMIDOL (ISOVUE-370) INJECTION 76% COMPARISON:  Head CT earlier today FINDINGS: CTA NECK FINDINGS Aortic arch: Partially covered and normal-appearing. Three  vessel branching. Right carotid system: Smooth and widely patent. No atheromatous changes. Left carotid system: Smooth and widely patent. No atheromatous changes. Tortuosity with partial retropharyngeal course. Vertebral arteries: No proximal subclavian stenosis, atherosclerosis, or irregularity. Codominant vertebral arteries that are smooth and widely patent to the dura. Skeleton: No acute or aggressive finding. Other neck: Bilateral thyroid nodules, 25 mm on the right and subcentimeter on the left. Upper chest: Negative Review of the MIP images confirms the above findings CTA HEAD FINDINGS Anterior circulation: Carotid siphons are smooth and widely patent. No noted atheromatous changes. No branch occlusion or flow limiting stenosis. Posterior circulation: Vertebral and basilar arteries are smooth and widely patent. Inferior cerebellar arteries are not well visualized. There is a left P1 occlusion beginning at the origin. On coronal reformats the top of the basilar is free of clot. Venous sinuses: Patent Anatomic variants: None significant Delayed phase: Not performed in the emergent setting Review of the MIP images confirms the above findings CT Brain Perfusion Findings: CBF (<30%) Volume: 75mL Perfusion (Tmax>6.0s) volume: 51mL-seen in the left PCA distribution. Artifact from motion. Findings already known to Dr. Rory Percy at time of case opening. IMPRESSION: 1. 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 14 cc of left PCA distribution penumbra with no infarct by CTP. 2. 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 No embolic source identified in the head or neck. No atheromatous changes or chronic stenoses. 3. 2.5 cm right thyroid nodule, recommend sonographic follow-up. Electronically Signed   By: Monte Fantasia M.D.   On: 12/01/2017 16:35   Mr Jodene Nam Head Wo Contrast  Result Date: 12/03/2017 CLINICAL DATA:  Stroke.  Status post tPA administration. EXAM: MRI HEAD WITHOUT CONTRAST MRA HEAD WITHOUT  CONTRAST TECHNIQUE: Multiplanar, multiecho pulse sequences of the brain and surrounding structures were obtained without intravenous contrast. Angiographic images of the head were obtained using MRA technique without contrast. COMPARISON:  None. Head CT 12/02/2017  FINDINGS: MRI HEAD FINDINGS Brain: The midline structures are normal. There are multiple areas abnormal diffusion restriction. There are many scattered lesions in the cerebellum. The largest area of ischemia is in the left PCA territory. There also foci of ischemia in the left thalamus. There is multifocal cytotoxic edema corresponding to the areas of ischemia. No mass lesion, hydrocephalus, dural abnormality or extra-axial collection. There is also an old right cerebellar infarct. No age-advanced or lobar predominant atrophy. Minimal susceptibility abnormality of the left occipital lobe. There is blooming at the site of the left PCA stent. Skull and upper cervical spine: The visualized skull base, calvarium, upper cervical spine and extracranial soft tissues are normal. Sinuses/Orbits: No fluid levels or advanced mucosal thickening. No mastoid or middle ear effusion. Normal orbits. MRA HEAD FINDINGS Intracranial internal carotid arteries: Normal. Anterior cerebral arteries: Normal. Middle cerebral arteries: Normal. Posterior communicating arteries: Present bilaterally. Posterior cerebral arteries: There is a stent within the P1 segment of the left posterior cerebral artery that extends into the mid P 2 segment. There is no flow related enhancement seen beyond the extent of the stent. Basilar artery: Normal. Vertebral arteries: Left dominant. Normal. Superior cerebellar arteries: Normal. Anterior inferior cerebellar arteries: Normal. Posterior inferior cerebellar arteries: Normal. IMPRESSION: 1. Multifocal acute ischemia within the posterior circulation territories, predominantly within the left PCA distribution, including the left occipital lobe and left  thalamus. Numerous smaller foci of acute ischemia throughout both cerebellar hemispheres. 2. Minimal susceptibility blooming in the left occipital lobe, likely indicating the presence of a minimal amount of petechial blood. 3. Stent within the P1 segment of the left PCA without flow related enhancement distal to the stent. The stent may be occluded. This could be further assessed with CTA of the head, if clinically warranted. Electronically Signed   By: Ulyses Jarred M.D.   On: 12/03/2017 03:10   Mr Brain Wo Contrast  Result Date: 12/03/2017 CLINICAL DATA:  Stroke.  Status post tPA administration. EXAM: MRI HEAD WITHOUT CONTRAST MRA HEAD WITHOUT CONTRAST TECHNIQUE: Multiplanar, multiecho pulse sequences of the brain and surrounding structures were obtained without intravenous contrast. Angiographic images of the head were obtained using MRA technique without contrast. COMPARISON:  None. Head CT 12/02/2017 FINDINGS: MRI HEAD FINDINGS Brain: The midline structures are normal. There are multiple areas abnormal diffusion restriction. There are many scattered lesions in the cerebellum. The largest area of ischemia is in the left PCA territory. There also foci of ischemia in the left thalamus. There is multifocal cytotoxic edema corresponding to the areas of ischemia. No mass lesion, hydrocephalus, dural abnormality or extra-axial collection. There is also an old right cerebellar infarct. No age-advanced or lobar predominant atrophy. Minimal susceptibility abnormality of the left occipital lobe. There is blooming at the site of the left PCA stent. Skull and upper cervical spine: The visualized skull base, calvarium, upper cervical spine and extracranial soft tissues are normal. Sinuses/Orbits: No fluid levels or advanced mucosal thickening. No mastoid or middle ear effusion. Normal orbits. MRA HEAD FINDINGS Intracranial internal carotid arteries: Normal. Anterior cerebral arteries: Normal. Middle cerebral arteries:  Normal. Posterior communicating arteries: Present bilaterally. Posterior cerebral arteries: There is a stent within the P1 segment of the left posterior cerebral artery that extends into the mid P 2 segment. There is no flow related enhancement seen beyond the extent of the stent. Basilar artery: Normal. Vertebral arteries: Left dominant. Normal. Superior cerebellar arteries: Normal. Anterior inferior cerebellar arteries: Normal. Posterior inferior cerebellar arteries: Normal. IMPRESSION: 1. Multifocal acute  ischemia within the posterior circulation territories, predominantly within the left PCA distribution, including the left occipital lobe and left thalamus. Numerous smaller foci of acute ischemia throughout both cerebellar hemispheres. 2. Minimal susceptibility blooming in the left occipital lobe, likely indicating the presence of a minimal amount of petechial blood. 3. Stent within the P1 segment of the left PCA without flow related enhancement distal to the stent. The stent may be occluded. This could be further assessed with CTA of the head, if clinically warranted. Electronically Signed   By: Ulyses Jarred M.D.   On: 12/03/2017 03:10   Ct Cerebral Perfusion W Contrast  Result Date: 12/01/2017 CLINICAL DATA:  Right-sided ataxia/weakness. EXAM: CT ANGIOGRAPHY HEAD AND NECK CT PERFUSION BRAIN TECHNIQUE: Multidetector CT imaging of the head and neck was performed using the standard protocol during bolus administration of intravenous contrast. Multiplanar CT image reconstructions and MIPs were obtained to evaluate the vascular anatomy. Carotid stenosis measurements (when applicable) are obtained utilizing NASCET criteria, using the distal internal carotid diameter as the denominator. Multiphase CT imaging of the brain was performed following IV bolus contrast injection. Subsequent parametric perfusion maps were calculated using RAPID software. CONTRAST:  39mL ISOVUE-370 IOPAMIDOL (ISOVUE-370) INJECTION 76%  COMPARISON:  Head CT earlier today FINDINGS: CTA NECK FINDINGS Aortic arch: Partially covered and normal-appearing. Three vessel branching. Right carotid system: Smooth and widely patent. No atheromatous changes. Left carotid system: Smooth and widely patent. No atheromatous changes. Tortuosity with partial retropharyngeal course. Vertebral arteries: No proximal subclavian stenosis, atherosclerosis, or irregularity. Codominant vertebral arteries that are smooth and widely patent to the dura. Skeleton: No acute or aggressive finding. Other neck: Bilateral thyroid nodules, 25 mm on the right and subcentimeter on the left. Upper chest: Negative Review of the MIP images confirms the above findings CTA HEAD FINDINGS Anterior circulation: Carotid siphons are smooth and widely patent. No noted atheromatous changes. No branch occlusion or flow limiting stenosis. Posterior circulation: Vertebral and basilar arteries are smooth and widely patent. Inferior cerebellar arteries are not well visualized. There is a left P1 occlusion beginning at the origin. On coronal reformats the top of the basilar is free of clot. Venous sinuses: Patent Anatomic variants: None significant Delayed phase: Not performed in the emergent setting Review of the MIP images confirms the above findings CT Brain Perfusion Findings: CBF (<30%) Volume: 31mL Perfusion (Tmax>6.0s) volume: 40mL-seen in the left PCA distribution. Artifact from motion. Findings already known to Dr. Rory Percy at time of case opening. IMPRESSION: 1. 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 14 cc of left PCA distribution penumbra with no infarct by CTP. 2. 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 No embolic source identified in the head or neck. No atheromatous changes or chronic stenoses. 3. 2.5 cm right thyroid nodule, recommend sonographic follow-up. Electronically Signed   By: Monte Fantasia M.D.   On: 12/01/2017 16:35   Dg Chest Portable 1 View  Result  Date: 12/03/2017 CLINICAL DATA:  Respiratory failure, intubation, history hypertension, asthma, sleep apnea, heart murmur EXAM: PORTABLE CHEST 1 VIEW COMPARISON:  Portable exam 0920 hours compared to 12/01/2017 FINDINGS: Tip of endotracheal tube projects 5.1 cm above carina. Nasogastric tube extends into stomach. Enlargement of cardiac silhouette with vascular congestion. Prominent mediastinum unchanged. Asymmetric LEFT LEFT greater than RIGHT perihilar infiltrates question asymmetric edema versus infection. No gross pleural effusion or pneumothorax. IMPRESSION: Satisfactory endotracheal tube position. Enlargement of cardiac silhouette with pulmonary vascular congestion and asymmetric pulmonary infiltrates LEFT greater than RIGHT question asymmetric  pulmonary edema versus infection. Electronically Signed   By: Lavonia Dana M.D.   On: 12/03/2017 09:38   Dg Chest Port 1 View  Result Date: 12/01/2017 CLINICAL DATA:  Intubation EXAM: PORTABLE CHEST 1 VIEW COMPARISON:  None. FINDINGS: Endotracheal tube tip at the level of the clavicular heads. Orogastric tube courses beyond the field of view. There is cardiomegaly and pulmonary vascular congestion without overt edema. No focal airspace consolidation. No sizable pleural effusion. IMPRESSION: Radiographically appropriate position of endotracheal tube. Electronically Signed   By: Ulyses Jarred M.D.   On: 12/01/2017 23:39   Ct Head Code Stroke Wo Contrast  Result Date: 12/01/2017 CLINICAL DATA:  Code stroke. Right-sided weakness beginning 30-40 minutes ago at the patient's desk. EXAM: CT HEAD WITHOUT CONTRAST TECHNIQUE: Contiguous axial images were obtained from the base of the skull through the vertex without intravenous contrast. COMPARISON:  None. FINDINGS: Brain: Small remote right cerebellar infarct inferiorly. No visible acute infarct. No hemorrhage, masslike finding, or hydrocephalus. Vascular: Concern for hyperdense left P1 segment, see sagittal reformats. No  hyperdensity of MCAs. Skull: Negative Sinuses/Orbits: Negative Other: Case reviewed in person with Dr. Rory Percy.  CTA pending ASPECTS [facility] Stroke Program Early CT Score) left hemisphere - Ganglionic level infarction (caudate, lentiform nuclei, internal capsule, insula, M1-M3 cortex): 7 - Supraganglionic infarction (M4-M6 cortex): 3 Total score (0-10 with 10 being normal): 10 IMPRESSION: 1. Possible hyperdense left P1 segment. 2. Negative for hemorrhage or visible acute infarct. 3. Small remote right inferior cerebellar infarct. Electronically Signed   By: Monte Fantasia M.D.   On: 12/01/2017 16:15    Labs:  CBC: Recent Labs    12/01/17 1550 12/01/17 1600 12/02/17 0535 12/03/17 0422  WBC 10.5  --  12.5* 12.6*  HGB 17.1* 17.3* 14.2 13.9  HCT 48.8 51.0 40.8 40.8  PLT 243  --  268 196    COAGS: Recent Labs    12/01/17 1550  INR 0.92  APTT 27    BMP: Recent Labs    12/01/17 1550 12/01/17 1600 12/02/17 0535 12/02/17 1600 12/03/17 0422  NA 139 142 141  --  140  K 3.4* 3.6 3.3* 3.7 3.5  CL 100* 98* 111  --  110  CO2 26  --  21*  --  21*  GLUCOSE 161* 163* 93  --  84  BUN 18 22* 15  --  8  CALCIUM 9.0  --  7.3*  --  7.6*  CREATININE 1.08 1.00 0.90  --  0.88  GFRNONAA >60  --  >60  --  >60  GFRAA >60  --  >60  --  >60    LIVER FUNCTION TESTS: Recent Labs    12/01/17 1550  BILITOT 2.1*  AST 39  ALT 51  ALKPHOS 68  PROT 7.0  ALBUMIN 3.9    Assessment and Plan:  CVA Revasc L  PCA; L PCA stent 1/26 Will follow  Plan per Dr Erlinda Hong  Electronically Signed: Monia Sabal A, PA-C 12/03/2017, 1:29 PM   I spent a total of 15 Minutes at the the patient's bedside AND on the patient's hospital floor or unit, greater than 50% of which was counseling/coordinating care for CVA; L PCA revasc

## 2017-12-03 NOTE — Progress Notes (Signed)
Patient transported to MRI and back to 4N28 without any complications. Vitals stable.

## 2017-12-03 NOTE — Progress Notes (Signed)
STROKE TEAM PROGRESS NOTE   SUBJECTIVE (INTERVAL HISTORY) Wife and mom are at the bedside. Reviewed images with them and updated pt condition. Pt still intubated, not able to tolerate weaning due to apnea. Still on pressors and propofol and fentanyl. Had MRI done overnight showed multifocal infarcts involving left PCA, right SCA and b/l PICA. However, left PCA stent seems occluded and right SCA was occluded.    OBJECTIVE Temp:  [98.6 F (37 C)-101.3 F (38.5 C)] 101.3 F (38.5 C) (01/28 0800) Pulse Rate:  [54-82] 76 (01/28 0800) Cardiac Rhythm: Normal sinus rhythm (01/28 0824) Resp:  [13-19] 18 (01/28 0822) BP: (95-148)/(51-93) 148/85 (01/28 0822) SpO2:  [97 %-100 %] 100 % (01/28 0800) Arterial Line BP: (105-164)/(49-100) 146/68 (01/28 0800) FiO2 (%):  [40 %] 40 % (01/28 0824)  CBC:  Recent Labs  Lab 12/01/17 1550  12/02/17 0535 12/03/17 0422  WBC 10.5  --  12.5* 12.6*  NEUTROABS 7.0  --   --  10.3*  HGB 17.1*   < > 14.2 13.9  HCT 48.8   < > 40.8 40.8  MCV 88.7  --  90.7 91.7  PLT 243  --  268 196   < > = values in this interval not displayed.    Basic Metabolic Panel:  Recent Labs  Lab 12/02/17 0535 12/02/17 1600 12/03/17 0422  NA 141  --  140  K 3.3* 3.7 3.5  CL 111  --  110  CO2 21*  --  21*  GLUCOSE 93  --  84  BUN 15  --  8  CREATININE 0.90  --  0.88  CALCIUM 7.3*  --  7.6*  MG 2.0  --  1.9  PHOS 3.3  --   --     Lipid Panel:     Component Value Date/Time   CHOL 144 12/02/2017 0535   TRIG 246 (H) 12/02/2017 0535   TRIG 244 (H) 12/02/2017 0535   HDL 37 (L) 12/02/2017 0535   CHOLHDL 3.9 12/02/2017 0535   VLDL 49 (H) 12/02/2017 0535   LDLCALC 58 12/02/2017 0535   HgbA1c:  Lab Results  Component Value Date   HGBA1C 5.7 (H) 12/02/2017   Urine Drug Screen: No results found for: LABOPIA, COCAINSCRNUR, LABBENZ, AMPHETMU, THCU, LABBARB  Alcohol Level No results found for: [place] I have personally reviewed the radiological images below and agree  with the radiology interpretations.  Ct Angio Head W Or Wo Contrast Ct Angio Neck W Or Wo Contrast  Ct Cerebral Perfusion W Contrast 12/01/2017 IMPRESSION:  1. 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 14 cc of left PCA distribution penumbra with no infarct by CTP.  2. 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 No embolic source identified in the head or neck. No atheromatous changes or chronic stenoses.  3. 2.5 cm right thyroid nodule, recommend sonographic follow-up.   Ct Head Wo Contrast 12/01/2017 IMPRESSION:  1. Subarachnoid hyperdensity within the posterior left temporal lobe and left occipital lobe, which could be contrast staining or petechial/subarachnoid hemorrhage.  2. No herniation, hydrocephalus or intraparenchymal hematoma.   Ct Head Code Stroke Wo Contrast 12/01/2017 IMPRESSION:  1. Possible hyperdense left P1 segment.  2. Negative for hemorrhage or visible acute infarct.  3. Small remote right inferior cerebellar infarct.   Mri and Mra Head Wo Contrast 12/03/2017 IMPRESSION: 1. Multifocal acute ischemia within the posterior circulation territories, predominantly within the left PCA distribution, including the left occipital lobe and left thalamus.  Numerous smaller foci of acute ischemia throughout both cerebellar hemispheres. 2. Minimal susceptibility blooming in the left occipital lobe, likely indicating the presence of a minimal amount of petechial blood. 3. Stent within the P1 segment of the left PCA without flow related enhancement distal to the stent. The stent may be occluded. This could be further assessed with CTA of the head, if clinically warranted.   CT HEAD  1. Continued decrease in subarachnoid high-density along the posterior left cerebral hemisphere. No hydrocephalus. 2. Moderate acute left occipital infarct without detected progression. 3. Question small right superior cerebellar infarct.  Transthoracic Echocardiogram - Left ventricle:  The cavity size was normal. Wall thickness was   increased in a pattern of mild LVH. Systolic function was normal.   The estimated ejection fraction was in the range of 55% to 60%.   Wall motion was normal; there were no regional wall motion   abnormalities. Left ventricular diastolic function parameters   were normal. - Aortic valve: Moderately calcified annulus. Trileaflet;   moderately thickened leaflets. There was moderate stenosis. There   was mild regurgitation. Mean gradient (S): 22 mm Hg. Valve area   (VTI): 1.36 cm^2. Valve area (Vmax): 1.17 cm^2. Valve area   (Vmean): 1.25 cm^2. - Mitral valve: Mildly calcified annulus. Mildly thickened leaflets  Cerebral angiogram - Dr. Estanislado Pandy 12/01/2017 S/P bilateral common carotid and vertebral artery angiograms,followed  By partial revascularization of Lt PCA with x 2 passes with solitaire 84mm x 40 mm retriever device ,x 1 pass with the embotrap retriever device  And placement of intracranial stent in the Lt PCA for recurrent occlusion after retriever passes achieving a TICI 2b re pertfusion,followe by .75 mg og aggrastat IA into the basilar artery with slow revascularization of intrastent occlusion.due to acute platelet aggregation. Also loaded with brilinta 180 mg and aspirin 81 mg via orogastric tube prior to stent placement   PHYSICAL EXAM Vitals:   12/03/17 0630 12/03/17 0700 12/03/17 0800 12/03/17 0822  BP: 108/63 113/67 113/69 (!) 148/85  Pulse: 71 71 76   Resp: 18 18 18 18   Temp:   (!) 101.3 F (38.5 C)   TempSrc:   Axillary   SpO2: 100% 100% 100%   Weight:      Height:        Temp:  [98.6 F (37 C)-101.3 F (38.5 C)] 101.3 F (38.5 C) (01/28 0800) Pulse Rate:  [54-82] 76 (01/28 0800) Resp:  [13-19] 18 (01/28 0822) BP: (95-148)/(51-93) 148/85 (01/28 0822) SpO2:  [97 %-100 %] 100 % (01/28 0800) Arterial Line BP: (105-164)/(49-100) 146/68 (01/28 0800) FiO2 (%):  [40 %] 40 % (01/28 0824)  General - Well nourished,  well developed, intubated.  Ophthalmologic - Fundi not visualized due to small pupils.  Cardiovascular - Regular rate and rhythm.  Neuro - intubated on sedation and pressor. Eyes closed but able to briefly open eyes on voice, not following commands. PERRL, small pupils on sedation, doll's eye weak, also weak corneal, but positive gag and cough. On pain stimulation, moving all extremities but seems more movement on the left then right. DTR 1+ and no babinski. Sensation, coordination and gait not tested.     ASSESSMENT/PLAN [name] is a 52 y.o. male with history of asthma, hypertension, and obstructive sleep apnea (wears C Pap) presenting with right-sided numbness.  The patient received TPA on Saturday, 12/01/2017 at [facility]. -> Interventional radiology.  Stroke: Left PCA infarct with P1 occlusion s/p tPA and IR with TICI 2b reperfusion  and left P1 stent - possibly embolic - source unknown. Additional right SCA and b/l PICA infarcts likely due to procedure  Resultant  Intubated on pressor and sedation  CT head -  Possible hyperdense left P1 segment. Subarachnoid hyperdensity within the posterior left temporal lobe and left occipital lobe, which could be contrast staining or petechial/subarachnoid hemorrhage.   CT repeat decreased subarachnoid hyperdensity, left PCA infact  MRI head - left PCA, right SCA and b/l PICA infarcts  MRA head - left PCA occlusion s/p stent, right SCA occlusion  2D Echo - EF 55-60%  LDL - 58  HgbA1c - 5.7  VTE prophylaxis - heparin subq Fall precautions Diet NPO time specified  No antithrombotic prior to admission, now on aspirin 325 mg daily and Brilinta.   Patient counseled to be compliant with his antithrombotic medications  Ongoing aggressive stroke risk factor management  Therapy recommendations:  pending  Disposition:  Pending  Respiratory failure  Intubated for procedure  Failed weaning this am  CCM on board  On sedation and  neo  Extubate as able  Hypertension  Stable  Permissive hypertension (OK if < 180/105) but gradually normalize in 5-7 days  Long-term BP goal normotensive  Other Stroke Risk Factors  Obesity, Body mass index is 44.6 kg/m., recommend weight loss, diet and exercise as appropriate   Hx stroke/TIA - remote right PICA infarct by imaging  OSA, on CPAP at home  Other Active Problems  Hypokalemia - 3.3 - supplement   Mild leukocytosis - 12.5 - temp 98.8   Hospital day # 2  This patient is critically ill due to right PCA infarct s/p tPA and IR, intubated and at significant risk of neurological worsening, death form recurrent stroke, hemorrhagic conversion, seizure, respiratory failure. This patient's care requires constant monitoring of vital signs, hemodynamics, respiratory and cardiac monitoring, review of multiple databases, neurological assessment, discussion with family, other specialists and medical decision making of high complexity. I had long discussion with mom and wife at bedside, updated pt current condition, treatment plan and potential prognosis. They expressed understanding and appreciation. I spent 45 minutes of neurocritical care time in the care of this patient.  Rosalin Hawking, MD PhD Stroke Neurology 12/03/2017 11:24 AM    To contact Stroke Continuity provider, please refer to [email or web address] After hours, contact General Neurology

## 2017-12-03 NOTE — Progress Notes (Signed)
PT Cancellation Note  Patient Details Name: William Lowery MRN: [id number] DOB: April 19, 1966   Cancelled Treatment:    Reason Eval/Treat Not Completed: Medical issues which prohibited therapy.  Pt with recent extubation and lethargic. Will return as schedule allows. Thank you.   Duncan Dull 12/03/2017, 4:38 PM Alben Deeds, PT DPT  Board Certified Neurologic Specialist [phone]

## 2017-12-03 NOTE — Procedures (Signed)
Extubation Procedure Note  Patient Details:   Name: William Lowery DOB: 1966-03-11 MRN: [id number]   Airway Documentation:     Evaluation  O2 sats: stable throughout Complications: No apparent complications Patient did tolerate procedure well. Bilateral Breath Sounds: Clear   Yes   Patient extubated 2lnc. Vital signs stable at this time. Patient tolerating well. RN and family at bedside. RT will continue to monitor.  Mcneil Sober 12/03/2017, 2:07 PM

## 2017-12-03 NOTE — Progress Notes (Signed)
SLP Cancellation Note  Patient Details Name: Jerimie Mancuso MRN: [id number] DOB: September 14, 1966   Cancelled treatment:       Reason Eval/Treat Not Completed: Medical issues which prohibited therapy. Intubated. Will f/u.  Jim Thorpe, CCC-SLP [phone]    Gabriel Rainwater Meryl 12/03/2017, 8:49 AM

## 2017-12-03 NOTE — Progress Notes (Signed)
OT Cancellation    12/03/17 1400  OT Visit Information  Last OT Received On 12/03/17  Reason Eval/Treat Not Completed Patient not medically ready. Pt with recent extubation and lethargic. Will return as schedule allows. Thank you.    [place], OTR/L Acute Rehab Pager: [phone] Office: [phone]

## 2017-12-04 ENCOUNTER — Encounter (HOSPITAL_COMMUNITY): Payer: Self-pay

## 2017-12-04 ENCOUNTER — Inpatient Hospital Stay (HOSPITAL_COMMUNITY): Payer: PRIVATE HEALTH INSURANCE

## 2017-12-04 DIAGNOSIS — I639 Cerebral infarction, unspecified: Secondary | ICD-10-CM

## 2017-12-04 DIAGNOSIS — I63532 Cerebral infarction due to unspecified occlusion or stenosis of left posterior cerebral artery: Principal | ICD-10-CM

## 2017-12-04 LAB — CBC
HCT: 39.1 % (ref 39.0–52.0)
HEMOGLOBIN: 13.8 g/dL (ref 13.0–17.0)
MCH: 31.5 pg (ref 26.0–34.0)
MCHC: 35.3 g/dL (ref 30.0–36.0)
MCV: 89.3 fL (ref 78.0–100.0)
PLATELETS: 170 10*3/uL (ref 150–400)
RBC: 4.38 MIL/uL (ref 4.22–5.81)
RDW: 12.8 % (ref 11.5–15.5)
WBC: 9.2 10*3/uL (ref 4.0–10.5)

## 2017-12-04 LAB — GLUCOSE, CAPILLARY
GLUCOSE-CAPILLARY: 111 mg/dL — AB (ref 65–99)
Glucose-Capillary: 115 mg/dL — ABNORMAL HIGH (ref 65–99)
Glucose-Capillary: 113 mg/dL — ABNORMAL HIGH (ref 65–99)
Glucose-Capillary: 98 mg/dL (ref 65–99)
Glucose-Capillary: 92 mg/dL (ref 65–99)

## 2017-12-04 LAB — BASIC METABOLIC PANEL
ANION GAP: 9 (ref 5–15)
BUN: 14 mg/dL (ref 6–20)
CHLORIDE: 111 mmol/L (ref 101–111)
CO2: 23 mmol/L (ref 22–32)
Calcium: 8.3 mg/dL — ABNORMAL LOW (ref 8.9–10.3)
Creatinine, Ser: 0.73 mg/dL (ref 0.61–1.24)
GFR calc Af Amer: >60 mL/min (ref >60)
GLUCOSE: 121 mg/dL — AB (ref 65–99)
POTASSIUM: 3.7 mmol/L (ref 3.5–5.1)
Sodium: 143 mmol/L (ref 135–145)

## 2017-12-04 LAB — VITAMIN B12: Vitamin B-12: 1057 pg/mL — ABNORMAL HIGH (ref 180–914)

## 2017-12-04 LAB — TSH: TSH: 0.288 u[IU]/mL — AB (ref 0.350–4.500)

## 2017-12-04 LAB — PHOSPHORUS: PHOSPHORUS: 2.6 mg/dL (ref 2.5–4.6)

## 2017-12-04 MED ORDER — ASPIRIN EC 81 MG PO TBEC
81.0000 mg | DELAYED_RELEASE_TABLET | Freq: Every day | ORAL | Status: DC
  Administered 2017-12-05 – 2017-12-10 (×6): 81 mg via ORAL
  Filled 2017-12-04 (×7): qty 1

## 2017-12-04 NOTE — Progress Notes (Signed)
Referring Physician(s): Dr Lavera Guise  Supervising Physician: Luanne Bras  Patient Status:  [facility] - In-pt  Chief Complaint:  CVA   Subjective:  L PCA revascularization ans stent placement 1/26 in IR Extubated PT with pt- up at side of bed   Allergies: Patient has no known allergies.  Medications: Prior to Admission medications   Medication Sig Start Date End Date Taking? Authorizing Provider  B Complex Vitamins (B-COMPLEX/B-12) TABS Take 1 tablet by mouth daily.   Yes [provider]  Cholecalciferol (VITAMIN D PO) Take by mouth daily.   Yes [provider]  losartan-hydrochlorothiazide (HYZAAR) 100-25 MG tablet Take 1 tablet by mouth daily. 09/09/17  Yes [provider]  Omega-3 Fatty Acids (FISH OIL PO) Take by mouth daily.   Yes [provider]  VITAMIN E PO Take by mouth daily.   Yes [provider]     Vital Signs: BP 123/88   Pulse 77   Temp 99.3 F (37.4 C) (Axillary)   Resp 15   Ht 5\' 10"  (1.778 m)   Wt (!) 311 lb 8.2 oz (141.3 kg)   SpO2 96%   BMI 44.70 kg/m   Physical Exam  HENT:  tongue is midline Shows teeth; puffs cheeks No facial droop  Eyes:  Eyes open/shut to command Doesn't seem completely focused    Musculoskeletal:  Right groin NT no bleeding Moving all 4s to command Arms slower than legs Right hand slower than left   Skin: Skin is warm and dry.  Nursing note and vitals reviewed.   Imaging: Ct Angio Head W Or Wo Contrast  Result Date: 12/01/2017 CLINICAL DATA:  Right-sided ataxia/weakness. EXAM: CT ANGIOGRAPHY HEAD AND NECK CT PERFUSION BRAIN TECHNIQUE: Multidetector CT imaging of the head and neck was performed using the standard protocol during bolus administration of intravenous contrast. Multiplanar CT image reconstructions and MIPs were obtained to evaluate the vascular anatomy. Carotid stenosis measurements (when applicable) are obtained utilizing NASCET criteria, using the  distal internal carotid diameter as the denominator. Multiphase CT imaging of the brain was performed following IV bolus contrast injection. Subsequent parametric perfusion maps were calculated using RAPID software. CONTRAST:  75mL ISOVUE-370 IOPAMIDOL (ISOVUE-370) INJECTION 76% COMPARISON:  Head CT earlier today FINDINGS: CTA NECK FINDINGS Aortic arch: Partially covered and normal-appearing. Three vessel branching. Right carotid system: Smooth and widely patent. No atheromatous changes. Left carotid system: Smooth and widely patent. No atheromatous changes. Tortuosity with partial retropharyngeal course. Vertebral arteries: No proximal subclavian stenosis, atherosclerosis, or irregularity. Codominant vertebral arteries that are smooth and widely patent to the dura. Skeleton: No acute or aggressive finding. Other neck: Bilateral thyroid nodules, 25 mm on the right and subcentimeter on the left. Upper chest: Negative Review of the MIP images confirms the above findings CTA HEAD FINDINGS Anterior circulation: Carotid siphons are smooth and widely patent. No noted atheromatous changes. No branch occlusion or flow limiting stenosis. Posterior circulation: Vertebral and basilar arteries are smooth and widely patent. Inferior cerebellar arteries are not well visualized. There is a left P1 occlusion beginning at the origin. On coronal reformats the top of the basilar is free of clot. Venous sinuses: Patent Anatomic variants: None significant Delayed phase: Not performed in the emergent setting Review of the MIP images confirms the above findings CT Brain Perfusion Findings: CBF (<30%) Volume: 66mL Perfusion (Tmax>6.0s) volume: 91mL-seen in the left PCA distribution. Artifact from motion. Findings already known to Dr. Rory Percy at time of case opening. IMPRESSION: 1. 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 14 cc of left PCA distribution penumbra with no infarct by CTP. 2. 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 No embolic source identified in the head or neck. No atheromatous changes or chronic stenoses. 3. 2.5 cm right thyroid nodule, recommend sonographic follow-up. Electronically Signed   By: Monte Fantasia M.D.   On: 12/01/2017 16:35   Ct Head Wo Contrast  Result Date: 12/02/2017 CLINICAL DATA:  Stroke follow-up.  24 hours post tPA administration. EXAM: CT HEAD WITHOUT CONTRAST TECHNIQUE: Contiguous axial images were obtained from the base of the skull through the vertex without intravenous contrast. COMPARISON:  Earlier today FINDINGS: Brain: Moderate Cytotoxic edema in inferior left occipital lobe that appears stable from prior. Small remote appearing right inferior and left superior cerebellar infarcts. Question small new right superior cerebellar infarct. Subarachnoid high-density in the posterior left cerebral sulci continues to diminish. No hydrocephalus. Vascular: Distal basilar to left PCA stent. Skull: Nonspecific scalp edema dependently. Sinuses/Orbits: Progressive nasal cavity opacification. The patient is intubated with nasopharyngeal and sinus fluid levels. Bilateral sinusitis. IMPRESSION: 1. Continued decrease in subarachnoid high-density along the posterior left cerebral hemisphere. No hydrocephalus. 2. Moderate acute left occipital infarct without detected progression. 3. Question small right superior cerebellar infarct. Electronically Signed   By: Monte Fantasia M.D.   On: 12/02/2017 16:20   Ct Head Wo Contrast  Result Date: 12/02/2017 CLINICAL DATA:  52 year old male with a history of acute stroke, left posterior cerebral artery occlusion, status post thrombectomy and PCA stent placement EXAM: CT HEAD WITHOUT CONTRAST TECHNIQUE: Contiguous axial images were obtained from the base of the skull through the vertex without intravenous contrast. COMPARISON:  CT, CT angiogram, and CT perfusion imaging 12/01/2017, CT 12/01/2017, angiogram 12/01/2017 FINDINGS: Brain: Evolving  subarachnoid/cortical density of the left posterior temporal and parietal/occipital region, which is less conspicuous on the current CT than the prior. No new acute intracranial hemorrhage. No midline shift or mass effect. Endovascular changes of stent placement of the left PCA again noted. Crowding of the sulci in the left parietal/occipital region. Vascular: Stenting of the left PCA. Skull: No acute bony abnormality.  No aggressive bony lesion. Sinuses/Orbits: Small air-fluid level of the right maxillary sinus with trace mucoperiosteal thickening. Minimal opacification of ethmoid air cells. Frontal sinuses and sphenoid sinuses remain patent. Other: Endotracheal tube remains in place. Mild subluxation of the bilateral TMJ fluid within the nasopharynx. IMPRESSION: No new acute intracranial hemorrhage, mass effect or midline shift. Resolving left posterior temporal and parietooccipital subarachnoid hyperdensity, representing either redistribution of blood products and/or resolving contrast staining. Similar appearance of crowded sulci in the left PCA territory, may represent evolving infarction or a secondary effect of the resolving hyperdensity. Correlation with MRI may be considered. Surgical changes of left PCA stenting. Electronically Signed   By: Corrie Mckusick D.O.   On: 12/02/2017 09:00   Ct Head Wo Contrast  Result Date: 12/01/2017 CLINICAL DATA:  Status post P1 segment PCA thrombectomy EXAM: CT HEAD WITHOUT CONTRAST TECHNIQUE: Contiguous axial images were obtained from the base of the skull through the vertex without intravenous contrast. COMPARISON:  Cerebral angiogram 12/01/2017 Head CT 12/01/2017 CTA head/neck 12/01/2017 FINDINGS: Brain: There is subarachnoid hyperdensity in the posterior left temporal lobe and left occipital lobe. No intraparenchymal hemorrhage. No midline shift or other mass effect. No hydrocephalus. Old right cerebellar stroke. Vascular: Mild intravascular enhancement related to  earlier contrast enhanced studies. Skull: Normal visualized skull base, calvarium and extracranial soft  tissues. Sinuses/Orbits: No sinus fluid levels or advanced mucosal thickening. No mastoid effusion. Normal orbits. IMPRESSION: 1. Subarachnoid hyperdensity within the posterior left temporal lobe and left occipital lobe, which could be contrast staining or petechial/subarachnoid hemorrhage. 2. No herniation, hydrocephalus or intraparenchymal hematoma. Electronically Signed   By: Ulyses Jarred M.D.   On: 12/01/2017 22:33   Ct Angio Neck W Or Wo Contrast  Result Date: 12/01/2017 CLINICAL DATA:  Right-sided ataxia/weakness. EXAM: CT ANGIOGRAPHY HEAD AND NECK CT PERFUSION BRAIN TECHNIQUE: Multidetector CT imaging of the head and neck was performed using the standard protocol during bolus administration of intravenous contrast. Multiplanar CT image reconstructions and MIPs were obtained to evaluate the vascular anatomy. Carotid stenosis measurements (when applicable) are obtained utilizing NASCET criteria, using the distal internal carotid diameter as the denominator. Multiphase CT imaging of the brain was performed following IV bolus contrast injection. Subsequent parametric perfusion maps were calculated using RAPID software. CONTRAST:  24mL ISOVUE-370 IOPAMIDOL (ISOVUE-370) INJECTION 76% COMPARISON:  Head CT earlier today FINDINGS: CTA NECK FINDINGS Aortic arch: Partially covered and normal-appearing. Three vessel branching. Right carotid system: Smooth and widely patent. No atheromatous changes. Left carotid system: Smooth and widely patent. No atheromatous changes. Tortuosity with partial retropharyngeal course. Vertebral arteries: No proximal subclavian stenosis, atherosclerosis, or irregularity. Codominant vertebral arteries that are smooth and widely patent to the dura. Skeleton: No acute or aggressive finding. Other neck: Bilateral thyroid nodules, 25 mm on the right and subcentimeter on the left. Upper  chest: Negative Review of the MIP images confirms the above findings CTA HEAD FINDINGS Anterior circulation: Carotid siphons are smooth and widely patent. No noted atheromatous changes. No branch occlusion or flow limiting stenosis. Posterior circulation: Vertebral and basilar arteries are smooth and widely patent. Inferior cerebellar arteries are not well visualized. There is a left P1 occlusion beginning at the origin. On coronal reformats the top of the basilar is free of clot. Venous sinuses: Patent Anatomic variants: None significant Delayed phase: Not performed in the emergent setting Review of the MIP images confirms the above findings CT Brain Perfusion Findings: CBF (<30%) Volume: 57mL Perfusion (Tmax>6.0s) volume: 63mL-seen in the left PCA distribution. Artifact from motion. Findings already known to Dr. Rory Percy at time of case opening. IMPRESSION: 1. 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 14 cc of left PCA distribution penumbra with no infarct by CTP. 2. 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 No embolic source identified in the head or neck. No atheromatous changes or chronic stenoses. 3. 2.5 cm right thyroid nodule, recommend sonographic follow-up. Electronically Signed   By: Monte Fantasia M.D.   On: 12/01/2017 16:35   Mr Jodene Nam Head Wo Contrast  Result Date: 12/03/2017 CLINICAL DATA:  Stroke.  Status post tPA administration. EXAM: MRI HEAD WITHOUT CONTRAST MRA HEAD WITHOUT CONTRAST TECHNIQUE: Multiplanar, multiecho pulse sequences of the brain and surrounding structures were obtained without intravenous contrast. Angiographic images of the head were obtained using MRA technique without contrast. COMPARISON:  None. Head CT 12/02/2017 FINDINGS: MRI HEAD FINDINGS Brain: The midline structures are normal. There are multiple areas abnormal diffusion restriction. There are many scattered lesions in the cerebellum. The largest area of ischemia is in the left PCA territory. There also  foci of ischemia in the left thalamus. There is multifocal cytotoxic edema corresponding to the areas of ischemia. No mass lesion, hydrocephalus, dural abnormality or extra-axial collection. There is also an old right cerebellar infarct. No age-advanced or lobar predominant atrophy. Minimal susceptibility abnormality  of the left occipital lobe. There is blooming at the site of the left PCA stent. Skull and upper cervical spine: The visualized skull base, calvarium, upper cervical spine and extracranial soft tissues are normal. Sinuses/Orbits: No fluid levels or advanced mucosal thickening. No mastoid or middle ear effusion. Normal orbits. MRA HEAD FINDINGS Intracranial internal carotid arteries: Normal. Anterior cerebral arteries: Normal. Middle cerebral arteries: Normal. Posterior communicating arteries: Present bilaterally. Posterior cerebral arteries: There is a stent within the P1 segment of the left posterior cerebral artery that extends into the mid P 2 segment. There is no flow related enhancement seen beyond the extent of the stent. Basilar artery: Normal. Vertebral arteries: Left dominant. Normal. Superior cerebellar arteries: Normal. Anterior inferior cerebellar arteries: Normal. Posterior inferior cerebellar arteries: Normal. IMPRESSION: 1. Multifocal acute ischemia within the posterior circulation territories, predominantly within the left PCA distribution, including the left occipital lobe and left thalamus. Numerous smaller foci of acute ischemia throughout both cerebellar hemispheres. 2. Minimal susceptibility blooming in the left occipital lobe, likely indicating the presence of a minimal amount of petechial blood. 3. Stent within the P1 segment of the left PCA without flow related enhancement distal to the stent. The stent may be occluded. This could be further assessed with CTA of the head, if clinically warranted. Electronically Signed   By: Ulyses Jarred M.D.   On: 12/03/2017 03:10   Mr Brain  Wo Contrast  Result Date: 12/03/2017 CLINICAL DATA:  Stroke.  Status post tPA administration. EXAM: MRI HEAD WITHOUT CONTRAST MRA HEAD WITHOUT CONTRAST TECHNIQUE: Multiplanar, multiecho pulse sequences of the brain and surrounding structures were obtained without intravenous contrast. Angiographic images of the head were obtained using MRA technique without contrast. COMPARISON:  None. Head CT 12/02/2017 FINDINGS: MRI HEAD FINDINGS Brain: The midline structures are normal. There are multiple areas abnormal diffusion restriction. There are many scattered lesions in the cerebellum. The largest area of ischemia is in the left PCA territory. There also foci of ischemia in the left thalamus. There is multifocal cytotoxic edema corresponding to the areas of ischemia. No mass lesion, hydrocephalus, dural abnormality or extra-axial collection. There is also an old right cerebellar infarct. No age-advanced or lobar predominant atrophy. Minimal susceptibility abnormality of the left occipital lobe. There is blooming at the site of the left PCA stent. Skull and upper cervical spine: The visualized skull base, calvarium, upper cervical spine and extracranial soft tissues are normal. Sinuses/Orbits: No fluid levels or advanced mucosal thickening. No mastoid or middle ear effusion. Normal orbits. MRA HEAD FINDINGS Intracranial internal carotid arteries: Normal. Anterior cerebral arteries: Normal. Middle cerebral arteries: Normal. Posterior communicating arteries: Present bilaterally. Posterior cerebral arteries: There is a stent within the P1 segment of the left posterior cerebral artery that extends into the mid P 2 segment. There is no flow related enhancement seen beyond the extent of the stent. Basilar artery: Normal. Vertebral arteries: Left dominant. Normal. Superior cerebellar arteries: Normal. Anterior inferior cerebellar arteries: Normal. Posterior inferior cerebellar arteries: Normal. IMPRESSION: 1. Multifocal acute  ischemia within the posterior circulation territories, predominantly within the left PCA distribution, including the left occipital lobe and left thalamus. Numerous smaller foci of acute ischemia throughout both cerebellar hemispheres. 2. Minimal susceptibility blooming in the left occipital lobe, likely indicating the presence of a minimal amount of petechial blood. 3. Stent within the P1 segment of the left PCA without flow related enhancement distal to the stent. The stent may be occluded. This could be further assessed with CTA of the  head, if clinically warranted. Electronically Signed   By: Ulyses Jarred M.D.   On: 12/03/2017 03:10   [place]  Result Date: 12/04/2017 INDICATION: Episodic confusion. Right-sided numbness and tingling with mild weakness. Intermittent left visual field blurring. Right homonymous hemianopsia. 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 EMERGENT LARGE VESSEL OCCLUSION THROMBOLYSIS (POSTERIOR CIRCULATION) 2. Rescue stenting of left posterior cerebral artery following thrombectomy COMPARISON:  CT angiogram of the head and neck of 12/01/2017. MEDICATIONS: 2 g of Ancef IV antibiotic was administered within 1 hour of the procedure. ANESTHESIA/SEDATION: General anesthesia. CONTRAST:  Isovue 300 approximately 140 mL. FLUOROSCOPY TIME:  Fluoroscopy Time: 94 minutes 6 seconds (4993 mGy). COMPLICATIONS: None immediate. TECHNIQUE: Following a full explanation of the procedure along with the potential associated complications, an informed witnessed consent was obtained from the patient's spouse. The risks of intracranial hemorrhage of 10%, worsening neurological deficit, ventilator dependency, death and inability to revascularize were all reviewed in detail with the patient's spouse. The patient was then put under general anesthesia by the Department of Anesthesiology at [facility]. The right groin was prepped and draped in the usual  sterile fashion. Thereafter using modified Seldinger technique, transfemoral access into the right common femoral artery was obtained without difficulty. Over a 0.035 inch guidewire a 5 French Pinnacle sheath was inserted. Through this, and also over a 0.035 inch guidewire a 5 [place] JB 1 catheter was advanced to the aortic arch region and selectively positioned in the left vertebral artery, left common carotid artery, right vertebral artery and right common carotid artery. FINDINGS: The left common carotid arteriogram demonstrates the left external carotid artery and its major branches to be widely patent. The left internal carotid artery at the bulb to the cranial skull base opacifies normally. The petrous, cavernous and supraclinoid segments demonstrate wide patency. The left middle cerebral artery and the left anterior cerebral artery opacify normally into the capillary and venous phases. No angiographic evidence of a left posterior communicating artery is seen. The left vertebral arteriogram demonstrates the origin of left vertebral artery to be widely patent The vessel is seen to opacify normally to the cranial skull base. Normal opacification is seen in the left posterior-inferior cerebellar artery and the left vertebrobasilar junction. The right posterior cerebral artery, the superior cerebellar arteries and the anterior-inferior cerebellar arteries opacify normally into the capillary and venous phases. 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 Unopacified blood is seen in the basilar artery from the contralateral vertebral artery. The right common carotid arteriogram demonstrates the right external carotid artery and its major branches to be widely patent. A right internal carotid artery is seen to opacify normally to the cranial skull base. The petrous, cavernous and supraclinoid  segments demonstrate wide patency. The right middle cerebral artery and the right anterior cerebral artery opacify into the capillary and venous phases. A very small right posterior communicating artery is seen. The right vertebral artery origin is widely patent. The vessel is seen to opacify normally to the cranial skull base. Normal patency is seen of the right posterior-inferior cerebellar artery and the right vertebrobasilar junction. The opacified portion of the basilar artery, the right posterior cerebral artery, the anterior-inferior cerebellar arteries and the left superior cerebellar artery opacify. There was flash opacification of the right superior cerebellar artery. Faint opacification is noted within the stented segment  of the left posterior cerebral artery. PROCEDURE: ENDOVASCULAR REVASCULARIZATION OF OCCLUDED LEFT POSTERIOR CEREBRAL ARTERY WITH 2 PASSES WITH THE SOLITAIRE FR 4 MM X 40 MM RETRIEVAL DEVICE, AND 1 PASS WITH THE EMBOTRAP RETRIEVAL DEVICE, FOLLOWED BY RESCUE STENTING OF PERSISTENT REOCCLUSION OF THE LEFT POSTERIOR CEREBRAL ARTERY P1 P2 SEGMENT, FOLLOWED BY INTRA-ARTERIAL SUPRASELECTIVE INFUSION OF AGGRASTAT WITH PARTIAL RECANALIZATION OF THE OCCLUDED STENT IN THE LEFT POSTERIOR CEREBRAL ARTERY P1 P2 SEGMENT. The diagnostic JB 1 catheter in the left subclavian artery was then exchanged over a 0.035 inch 300 cm Rosen exchange guidewire for an 8 French 55 cm Brite tip neurovascular sheath using biplane roadmap technique and constant fluoroscopic guidance. Good aspiration obtained from the hub of the Brite tip neurovascular catheter. This was then connected to continuous heparinized saline infusion. Over the [employer] guidewire, a 6 French 80 cm Cook shuttle sheath was then advanced and positioned in the distal left subclavian artery. The guidewire was removed. Good aspiration obtained from the hub of the [place] shuttle sheath. A gentle contrast injection demonstrated no evidence  of spasms, dissections or of intraluminal filling defects. Over a 0.014 inch Softip Synchro micro guidewire, a combination of a Catalyst 5 French 115 cm guide catheter inside of which was a Trevo ProVue 021 microcatheter was advanced through the distal portion of the [place] shuttle sheath and into the left vertebral artery. Biplane roadmap technique and constant fluoroscopic guidance was used in addition to continuous heparinized saline infusion. This combination was then advanced without difficulty to the first horizontal portion of the left vertebral artery. Thereafter using biplane roadmap technique and constant fluoroscopic guidance, in a coaxial manner and with constant heparinized saline infusion, this combination was then navigated with the micro guidewire leading with a J-tip configuration to avoid dissections or inducing spasm. The combination was advanced to the distal basilar artery. Thereafter, with gentle probing with the micro guidewire using a torque device, access was obtained without difficulty into the completely angiographic occluded left posterior cerebral artery. The micro guidewire and the microcatheter were then advanced to the P2 P3 segment of the left posterior cerebral artery. The micro guidewire was removed. Good aspiration obtained from the hub of the microcatheter. This was connected to continuous heparinized saline infusion. A 4 mm x 40 mm Solitaire FR retrieval device was then prepped and purged with heparinized saline infusion. This was then advanced to the distal portion of the microcatheter. The proximal and the distal markers of the retrieval device were then aligned appropriately. A gentle contrast injection through the 5 [place] Catalyst guide catheter demonstrated a TICI 2b reperfusion of the left posterior cerebral artery distribution. Thereafter with constant aspiration being applied at the hub of the [place] shuttle sheath which had now been advanced to the distal left  vertebral artery, and with a 25 mL syringe at the hub of the Catalyst guide catheter, the combination of the retrieval device microcatheter and the 5 [place] guide catheter was then retrieved and removed as constant aspiration was applied with a 60 mL syringe at the hub of the [employer] sheath. Aspiration was continued until the triaxial combination was removed. Good aspiration obtained from the hub of the [facility] shuttle sheath. Specks of clot were noted entangled in the interstices of the retrieval device. Control arteriogram performed through the [place] sheath demonstrated improved left posterior cerebral artery in its P1 region. There was now noted a filling defect in the basilar artery at the origin  of the superior cerebellar arteries. The thalamic perforators were widely patent. This prompted a second pass using the same triaxial combination into the left posterior cerebral artery via a Cook shuttle sheath. Again after having ascertained safe position of tip of the microcatheter in the P2 P3 region of the left posterior cerebral artery, the Solitaire retrieval device was then advanced and deployed as mentioned above without difficulty. Again a control arteriogram performed through the 5 [place] Catalyst guide catheter demonstrated a TICI 2b of the left posterior cerebral artery distribution. The previously noted clot in the distal basilar artery at the origins of the superior cerebellar artery was no longer present. Again after having deployed the retrieval device, and having partially captured in the microcatheter, the triaxial combination was retrieved as constant aspiration was applied with a 60 mL syringe at the hub of the [place] shuttle sheath. The aspiration was also applied with a 25 mL syringe at the hub of the Catalyst guide catheter. This combination was then again retrieved and removed as aspiration was continued. Following free aspiration at the hub of the [facility] shuttle sheath, a  control arteriogram performed through the [place] shuttle sheath in the left vertebral artery demonstrated complete revascularization of the distal basilar artery and no change in the left posterior cerebral artery occlusion at the distal P1 segment. Also at this time there is now a filling defect noted within the right superior cerebellar artery with slow flow to the right superior cerebellar artery. At this time, the patient was also given 3 mg of intra-arterial Integrilin into the basilar artery. The [place] system was then replaced with a 8 French 85 cm FlowGate balloon guide catheter which had been prepped with 50% contrast and 50% heparinized saline infusion. This was positioned in the distal vertical segment of the left vertebral artery. A gentle contrast injection demonstrated no evidence of spasms, dissections or filling defects within the left vertebral artery or distally. This was then connected to continuous heparinized saline infusion. At this time, the combination of the 5 [place] Catalyst guide catheter with a 021 Trevo ProVue microcatheter was advanced over a 0.014 inch Softip Synchro micro guidewire to the distal end of the basilar artery. The micro guidewire was again advanced into the P2 P3 region of the left posterior cerebral artery. The guidewire was removed. Good aspiration obtained from the hub of the microcatheter. This was then again connected to continuous heparinized saline infusion. The 5 [place] Catalyst guide catheter was advanced into the distal 1/3 of the basilar artery. A 4 mm x 39 mm Embotrap device was then advanced in a coaxial manner and with constant heparinized saline infusion using biplane roadmap technique and constant fluoroscopic guidance to the distal end of the microcatheter. The O ring on the delivery microcatheter was then loosened. With slight forward gentle traction with the right hand on the delivery micro guidewire, with the left hand the delivery  microcatheter was retrieved unsheathing the retrieval device. A gentle control arteriogram performed through the Catalyst guide catheter again demonstrated distal opacification. The balloon at the distal end of the [facility] guide catheter was then inflated for proximal flow arrest. The proximal portion of the retrieval device was captured into the microcatheter. Thereafter as constant aspiration was applied with a 60 mL syringe at the hub of the [facility] guide catheter, and the 5 [place] Catalyst guide catheter, the combination was retrieved and removed as constant aspiration was applied. The aspirate contained a long segment of  clot grayish black in color. Aspiration was continued as the balloon was deflated in the left vertebral artery. A control arteriogram performed through the 8 [place] FlowGate guide catheter in the left vertebral artery demonstrated opacification of the left superior cerebellar artery and partially the right superior cerebellar artery. There continued to be flow through the posterior cerebral artery. A TICI perfusion was noted in the left posterior cerebral artery distribution. 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 There was slightly improved flow through the right superior cerebellar artery. At this time, it was elected to proceed with rescue stenting of the left posterior cerebral artery The patient was loaded with 180 mg of Brilinta and 81 mg of aspirin via an orogastric tube. A select Plus 5 cm catheter inside of a 5 [place] Catalyst 115 cm guide catheter was advanced over a 0.014 inch Softip Synchro micro guidewire to the distal end of the FlowGate guide catheter in the left vertebral artery. With the micro guidewire leading with a J-tip configuration to avoid dissections or inducing spasm, the combination was navigated to the distal basilar artery. The micro guidewire was then gently  advanced using a torque device through the occluded left posterior cerebral artery and into the P2 P3 segment of the posterior cerebral artery followed by the microcatheter. The micro guidewire was then gently retrieved and removed. There was good aspiration of blood from the hub of the microcatheter. This was then connected to continuous heparinized saline infusion. It was decided to proceed with placement of a 4 mm x 39 mm Enterprise Codman stent device. This was then advanced in a coaxial manner and with constant heparinized saline infusion using biplane roadmap technique and constant fluoroscopic guidance to the distal end of the microcatheter. The distal and the proximal markers of the stent were then identified and positioned such that there was coverage of the entire left posterior cerebral artery P1 segment, and also the proximal P2 segment. The O ring on the delivery micro catheter was then loosened. With slight forward gentle traction with the right hand on the delivery micro guidewire, the microcatheter was then gently retrieved and removed slowly and deliberately until there was complete deployment of the stent. A gentle control arteriogram performed through the 5 [place] guide catheter demonstrated a TICI 3 reperfusion of the left posterior cerebral artery. Patency was noted of the left superior cerebellar artery, and also with marginally improved flow through the right superior cerebellar artery. The microcatheter and the delivery microcatheter were then removed. Control arteriogram performed at approximately 10 minutes following the placement of the stent demonstrated continues flow through the stented segment of the left posterior cerebral artery with a TICI 3 redistribution. A repeat arteriogram performed 15 minutes later demonstrated slow flow with progression to complete occlusion secondary to irrigation. At this time, it was decided to proceed with intra-arterial infusion of Aggrastat via the 5  [place] guide catheter positioned in the distal basilar artery. 15 cc containing 0.75 mg of Aggrastat were then infused slowly. Follow-up arteriograms were then performed through the 8 [place] FlowGate guide catheter and the left vertebral artery at 10 and 20 minutes after infusion of 0.75 mg of Aggrastat. The latter images demonstrated progressive slow revascularization at the distal end of the stent with slow perfusion of the P3 branches. It was, therefore, decided to infuse no more Aggrastat with concerns regarding increased chance of intracranial hemorrhage. CT was performed prior to the  infusion of the Aggrastat which demonstrated no gross intraparenchymal hemorrhages. However, there was suspicion of high density along the posterior temporal cortical and sulcal regions. A final control arteriogram performed through the [facility] guide catheter in the left vertebral artery demonstrated improved flow through the left posterior cerebral artery stented segment though slow. Also noted was slightly improved flow through the right superior cerebellar artery. Throughout the procedure, the patient's neurological and hemodynamic status remained stable. No extravasation of contrast or of mass-effect on the major vessel was noted. The 8 [place] FlowGate guide catheter and the 8 [place] neurovascular sheath were then retrieved into the abdominal aorta and exchanged over a J-tip guidewire for an 8 [place] Pinnacle sheath. This was then connected to continuous heparinized saline infusion. At the end of the procedure, the patient's right groin appeared soft without evidence of a hematoma. Distal pulses were Dopplerable in the dorsalis pedis and posterior tibial regions unchanged from prior to the procedure. The patient was then transferred to the CT scanner for postprocedural CT scan of the brain. IMPRESSION: Endovascular complete revascularization of occluded left posterior cerebral artery with 2 passes with a 4 mm x 40 mm  Solitaire FR retrieval device, and 1 pass with the 4 mm x 39 mm Embotrap retrieval device, followed by rescue stenting of reocclusion of the left posterior cerebral artery in the P2 P3 regions using an Enterprise stent device. Acute intra stent platelet aggregation treated with 0.75 mg of Aggrastat intra-arterially super selective as described with partial improved flow through the stented segment of the left posterior cerebral artery. PLAN: Patient transported to the CT suite for postprocedural CT scan of the brain. Electronically Signed   By: Luanne Bras M.D.   On: 12/03/2017 10:07   Ct Cerebral Perfusion W Contrast  Result Date: 12/01/2017 CLINICAL DATA:  Right-sided ataxia/weakness. EXAM: CT ANGIOGRAPHY HEAD AND NECK CT PERFUSION BRAIN TECHNIQUE: Multidetector CT imaging of the head and neck was performed using the standard protocol during bolus administration of intravenous contrast. Multiplanar CT image reconstructions and MIPs were obtained to evaluate the vascular anatomy. Carotid stenosis measurements (when applicable) are obtained utilizing NASCET criteria, using the distal internal carotid diameter as the denominator. Multiphase CT imaging of the brain was performed following IV bolus contrast injection. Subsequent parametric perfusion maps were calculated using RAPID software. CONTRAST:  18mL ISOVUE-370 IOPAMIDOL (ISOVUE-370) INJECTION 76% COMPARISON:  Head CT earlier today FINDINGS: CTA NECK FINDINGS Aortic arch: Partially covered and normal-appearing. Three vessel branching. Right carotid system: Smooth and widely patent. No atheromatous changes. Left carotid system: Smooth and widely patent. No atheromatous changes. Tortuosity with partial retropharyngeal course. Vertebral arteries: No proximal subclavian stenosis, atherosclerosis, or irregularity. Codominant vertebral arteries that are smooth and widely patent to the dura. Skeleton: No acute or aggressive finding. Other neck: Bilateral  thyroid nodules, 25 mm on the right and subcentimeter on the left. Upper chest: Negative Review of the MIP images confirms the above findings CTA HEAD FINDINGS Anterior circulation: Carotid siphons are smooth and widely patent. No noted atheromatous changes. No branch occlusion or flow limiting stenosis. Posterior circulation: Vertebral and basilar arteries are smooth and widely patent. Inferior cerebellar arteries are not well visualized. There is a left P1 occlusion beginning at the origin. On coronal reformats the top of the basilar is free of clot. Venous sinuses: Patent Anatomic variants: None significant Delayed phase: Not performed in the emergent setting Review of the MIP images confirms the above findings CT Brain Perfusion Findings: CBF (<30%) Volume: 32mL  Perfusion (Tmax>6.0s) volume: 43mL-seen in the left PCA distribution. Artifact from motion. Findings already known to Dr. Rory Percy at time of case opening. IMPRESSION: 1. 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 14 cc of left PCA distribution penumbra with no infarct by CTP. 2. 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 No embolic source identified in the head or neck. No atheromatous changes or chronic stenoses. 3. 2.5 cm right thyroid nodule, recommend sonographic follow-up. Electronically Signed   By: Monte Fantasia M.D.   On: 12/01/2017 16:35   Dg Chest Portable 1 View  Result Date: 12/03/2017 CLINICAL DATA:  Respiratory failure, intubation, history hypertension, asthma, sleep apnea, heart murmur EXAM: PORTABLE CHEST 1 VIEW COMPARISON:  Portable exam 0920 hours compared to 12/01/2017 FINDINGS: Tip of endotracheal tube projects 5.1 cm above carina. Nasogastric tube extends into stomach. Enlargement of cardiac silhouette with vascular congestion. Prominent mediastinum unchanged. Asymmetric LEFT LEFT greater than RIGHT perihilar infiltrates question asymmetric edema versus infection. No gross pleural effusion or pneumothorax.  IMPRESSION: Satisfactory endotracheal tube position. Enlargement of cardiac silhouette with pulmonary vascular congestion and asymmetric pulmonary infiltrates LEFT greater than RIGHT question asymmetric pulmonary edema versus infection. Electronically Signed   By: Lavonia Dana M.D.   On: 12/03/2017 09:38   Dg Chest Port 1 View  Result Date: 12/01/2017 CLINICAL DATA:  Intubation EXAM: PORTABLE CHEST 1 VIEW COMPARISON:  None. FINDINGS: Endotracheal tube tip at the level of the clavicular heads. Orogastric tube courses beyond the field of view. There is cardiomegaly and pulmonary vascular congestion without overt edema. No focal airspace consolidation. No sizable pleural effusion. IMPRESSION: Radiographically appropriate position of endotracheal tube. Electronically Signed   By: Ulyses Jarred M.D.   On: 12/01/2017 23:39   Ir Percutaneous Art Thrombectomy/infusion Intracranial Inc Diag Angio  Result Date: 12/04/2017 INDICATION: Episodic confusion. Right-sided numbness and tingling with mild weakness. Intermittent left visual field blurring. Right homonymous hemianopsia. 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 EMERGENT LARGE VESSEL OCCLUSION THROMBOLYSIS (POSTERIOR CIRCULATION) 2. Rescue stenting of left posterior cerebral artery following thrombectomy COMPARISON:  CT angiogram of the head and neck of 12/01/2017. MEDICATIONS: 2 g of Ancef IV antibiotic was administered within 1 hour of the procedure. ANESTHESIA/SEDATION: General anesthesia. CONTRAST:  Isovue 300 approximately 140 mL. FLUOROSCOPY TIME:  Fluoroscopy Time: 94 minutes 6 seconds (4993 mGy). COMPLICATIONS: None immediate. TECHNIQUE: Following a full explanation of the procedure along with the potential associated complications, an informed witnessed consent was obtained from the patient's spouse. The risks of intracranial hemorrhage of 10%, worsening neurological deficit, ventilator dependency, death and inability  to revascularize were all reviewed in detail with the patient's spouse. The patient was then put under general anesthesia by the Department of Anesthesiology at [facility]. The right groin was prepped and draped in the usual sterile fashion. Thereafter using modified Seldinger technique, transfemoral access into the right common femoral artery was obtained without difficulty. Over a 0.035 inch guidewire a 5 French Pinnacle sheath was inserted. Through this, and also over a 0.035 inch guidewire a 5 [place] JB 1 catheter was advanced to the aortic arch region and selectively positioned in the left vertebral artery, left common carotid artery, right vertebral artery and right common carotid artery. FINDINGS: The left common carotid arteriogram demonstrates the left external carotid artery and its major branches to be widely patent. The left internal carotid artery at the bulb to the cranial skull base opacifies normally. The petrous, cavernous and  supraclinoid segments demonstrate wide patency. The left middle cerebral artery and the left anterior cerebral artery opacify normally into the capillary and venous phases. No angiographic evidence of a left posterior communicating artery is seen. The left vertebral arteriogram demonstrates the origin of left vertebral artery to be widely patent The vessel is seen to opacify normally to the cranial skull base. Normal opacification is seen in the left posterior-inferior cerebellar artery and the left vertebrobasilar junction. The right posterior cerebral artery, the superior cerebellar arteries and the anterior-inferior cerebellar arteries opacify normally into the capillary and venous phases. 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 Unopacified blood is seen in the basilar artery from the contralateral vertebral artery. The right common carotid  arteriogram demonstrates the right external carotid artery and its major branches to be widely patent. A right internal carotid artery is seen to opacify normally to the cranial skull base. The petrous, cavernous and supraclinoid segments demonstrate wide patency. The right middle cerebral artery and the right anterior cerebral artery opacify into the capillary and venous phases. A very small right posterior communicating artery is seen. The right vertebral artery origin is widely patent. The vessel is seen to opacify normally to the cranial skull base. Normal patency is seen of the right posterior-inferior cerebellar artery and the right vertebrobasilar junction. The opacified portion of the basilar artery, the right posterior cerebral artery, the anterior-inferior cerebellar arteries and the left superior cerebellar artery opacify. There was flash opacification of the right superior cerebellar artery. Faint opacification is noted within the stented segment of the left posterior cerebral artery. PROCEDURE: ENDOVASCULAR REVASCULARIZATION OF OCCLUDED LEFT POSTERIOR CEREBRAL ARTERY WITH 2 PASSES WITH THE SOLITAIRE FR 4 MM X 40 MM RETRIEVAL DEVICE, AND 1 PASS WITH THE EMBOTRAP RETRIEVAL DEVICE, FOLLOWED BY RESCUE STENTING OF PERSISTENT REOCCLUSION OF THE LEFT POSTERIOR CEREBRAL ARTERY P1 P2 SEGMENT, FOLLOWED BY INTRA-ARTERIAL SUPRASELECTIVE INFUSION OF AGGRASTAT WITH PARTIAL RECANALIZATION OF THE OCCLUDED STENT IN THE LEFT POSTERIOR CEREBRAL ARTERY P1 P2 SEGMENT. The diagnostic JB 1 catheter in the left subclavian artery was then exchanged over a 0.035 inch 300 cm [place] exchange guidewire for an 8 French 55 cm Brite tip neurovascular sheath using biplane roadmap technique and constant fluoroscopic guidance. Good aspiration obtained from the hub of the Brite tip neurovascular catheter. This was then connected to continuous heparinized saline infusion. Over the [employer] guidewire, a 6 French 80 cm [place] shuttle  sheath was then advanced and positioned in the distal left subclavian artery. The guidewire was removed. Good aspiration obtained from the hub of the [place] shuttle sheath. A gentle contrast injection demonstrated no evidence of spasms, dissections or of intraluminal filling defects. Over a 0.014 inch Softip Synchro micro guidewire, a combination of a Catalyst 5 French 115 cm guide catheter inside of which was a Trevo ProVue 021 microcatheter was advanced through the distal portion of the [place] shuttle sheath and into the left vertebral artery. Biplane roadmap technique and constant fluoroscopic guidance was used in addition to continuous heparinized saline infusion. This combination was then advanced without difficulty to the first horizontal portion of the left vertebral artery. Thereafter using biplane roadmap technique and constant fluoroscopic guidance, in a coaxial manner and with constant heparinized saline infusion, this combination was then navigated with the micro guidewire leading with a J-tip configuration to avoid dissections or inducing spasm. The combination was advanced to the  distal basilar artery. Thereafter, with gentle probing with the micro guidewire using a torque device, access was obtained without difficulty into the completely angiographic occluded left posterior cerebral artery. The micro guidewire and the microcatheter were then advanced to the P2 P3 segment of the left posterior cerebral artery. The micro guidewire was removed. Good aspiration obtained from the hub of the microcatheter. This was connected to continuous heparinized saline infusion. A 4 mm x 40 mm Solitaire FR retrieval device was then prepped and purged with heparinized saline infusion. This was then advanced to the distal portion of the microcatheter. The proximal and the distal markers of the retrieval device were then aligned appropriately. A gentle contrast injection through the 5 [place] Catalyst guide catheter  demonstrated a TICI 2b reperfusion of the left posterior cerebral artery distribution. Thereafter with constant aspiration being applied at the hub of the [place] shuttle sheath which had now been advanced to the distal left vertebral artery, and with a 25 mL syringe at the hub of the Catalyst guide catheter, the combination of the retrieval device microcatheter and the 5 [place] guide catheter was then retrieved and removed as constant aspiration was applied with a 60 mL syringe at the hub of the [employer] sheath. Aspiration was continued until the triaxial combination was removed. Good aspiration obtained from the hub of the [facility] shuttle sheath. Specks of clot were noted entangled in the interstices of the retrieval device. Control arteriogram performed through the [place] sheath demonstrated improved left posterior cerebral artery in its P1 region. There was now noted a filling defect in the basilar artery at the origin of the superior cerebellar arteries. The thalamic perforators were widely patent. This prompted a second pass using the same triaxial combination into the left posterior cerebral artery via a [place] shuttle sheath. Again after having ascertained safe position of tip of the microcatheter in the P2 P3 region of the left posterior cerebral artery, the Solitaire retrieval device was then advanced and deployed as mentioned above without difficulty. Again a control arteriogram performed through the 5 [place] Catalyst guide catheter demonstrated a TICI 2b of the left posterior cerebral artery distribution. The previously noted clot in the distal basilar artery at the origins of the superior cerebellar artery was no longer present. Again after having deployed the retrieval device, and having partially captured in the microcatheter, the triaxial combination was retrieved as constant aspiration was applied with a 60 mL syringe at the hub of the [place] shuttle sheath. The aspiration  was also applied with a 25 mL syringe at the hub of the Catalyst guide catheter. This combination was then again retrieved and removed as aspiration was continued. Following free aspiration at the hub of the [facility] shuttle sheath, a control arteriogram performed through the [place] shuttle sheath in the left vertebral artery demonstrated complete revascularization of the distal basilar artery and no change in the left posterior cerebral artery occlusion at the distal P1 segment. Also at this time there is now a filling defect noted within the right superior cerebellar artery with slow flow to the right superior cerebellar artery. At this time, the patient was also given 3 mg of intra-arterial Integrilin into the basilar artery. The [place] system was then replaced with a 8 French 85 cm FlowGate balloon guide catheter which had been prepped with 50% contrast and 50% heparinized saline infusion. This was positioned in the distal vertical segment of the left vertebral artery.  A gentle contrast injection demonstrated no evidence of spasms, dissections or filling defects within the left vertebral artery or distally. This was then connected to continuous heparinized saline infusion. At this time, the combination of the 5 [place] Catalyst guide catheter with a 021 Trevo ProVue microcatheter was advanced over a 0.014 inch Softip Synchro micro guidewire to the distal end of the basilar artery. The micro guidewire was again advanced into the P2 P3 region of the left posterior cerebral artery. The guidewire was removed. Good aspiration obtained from the hub of the microcatheter. This was then again connected to continuous heparinized saline infusion. The 5 [place] Catalyst guide catheter was advanced into the distal 1/3 of the basilar artery. A 4 mm x 39 mm Embotrap device was then advanced in a coaxial manner and with constant heparinized saline infusion using biplane roadmap technique and constant fluoroscopic  guidance to the distal end of the microcatheter. The O ring on the delivery microcatheter was then loosened. With slight forward gentle traction with the right hand on the delivery micro guidewire, with the left hand the delivery microcatheter was retrieved unsheathing the retrieval device. A gentle control arteriogram performed through the Catalyst guide catheter again demonstrated distal opacification. The balloon at the distal end of the [facility] guide catheter was then inflated for proximal flow arrest. The proximal portion of the retrieval device was captured into the microcatheter. Thereafter as constant aspiration was applied with a 60 mL syringe at the hub of the [facility] guide catheter, and the 5 [place] Catalyst guide catheter, the combination was retrieved and removed as constant aspiration was applied. The aspirate contained a long segment of clot grayish black in color. Aspiration was continued as the balloon was deflated in the left vertebral artery. A control arteriogram performed through the 8 [place] FlowGate guide catheter in the left vertebral artery demonstrated opacification of the left superior cerebellar artery and partially the right superior cerebellar artery. There continued to be flow through the posterior cerebral artery. A TICI perfusion was noted in the left posterior cerebral artery distribution. 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 There was slightly improved flow through the right superior cerebellar artery. At this time, it was elected to proceed with rescue stenting of the left posterior cerebral artery The patient was loaded with 180 mg of Brilinta and 81 mg of aspirin via an orogastric tube. A select Plus 5 cm catheter inside of a 5 [place] Catalyst 115 cm guide catheter was advanced over a 0.014 inch Softip Synchro micro guidewire to the distal end of the FlowGate guide catheter in  the left vertebral artery. With the micro guidewire leading with a J-tip configuration to avoid dissections or inducing spasm, the combination was navigated to the distal basilar artery. The micro guidewire was then gently advanced using a torque device through the occluded left posterior cerebral artery and into the P2 P3 segment of the posterior cerebral artery followed by the microcatheter. The micro guidewire was then gently retrieved and removed. There was good aspiration of blood from the hub of the microcatheter. This was then connected to continuous heparinized saline infusion. It was decided to proceed with placement of a 4 mm x 39 mm Enterprise Codman stent device. This was then advanced in a coaxial manner and with constant heparinized saline infusion using biplane roadmap technique and constant fluoroscopic guidance to the distal end of the microcatheter. The distal and the proximal markers  of the stent were then identified and positioned such that there was coverage of the entire left posterior cerebral artery P1 segment, and also the proximal P2 segment. The O ring on the delivery micro catheter was then loosened. With slight forward gentle traction with the right hand on the delivery micro guidewire, the microcatheter was then gently retrieved and removed slowly and deliberately until there was complete deployment of the stent. A gentle control arteriogram performed through the 5 [place] guide catheter demonstrated a TICI 3 reperfusion of the left posterior cerebral artery. Patency was noted of the left superior cerebellar artery, and also with marginally improved flow through the right superior cerebellar artery. The microcatheter and the delivery microcatheter were then removed. Control arteriogram performed at approximately 10 minutes following the placement of the stent demonstrated continues flow through the stented segment of the left posterior cerebral artery with a TICI 3 redistribution. A  repeat arteriogram performed 15 minutes later demonstrated slow flow with progression to complete occlusion secondary to irrigation. At this time, it was decided to proceed with intra-arterial infusion of Aggrastat via the 5 [place] guide catheter positioned in the distal basilar artery. 15 cc containing 0.75 mg of Aggrastat were then infused slowly. Follow-up arteriograms were then performed through the 8 [place] FlowGate guide catheter and the left vertebral artery at 10 and 20 minutes after infusion of 0.75 mg of Aggrastat. The latter images demonstrated progressive slow revascularization at the distal end of the stent with slow perfusion of the P3 branches. It was, therefore, decided to infuse no more Aggrastat with concerns regarding increased chance of intracranial hemorrhage. CT was performed prior to the infusion of the Aggrastat which demonstrated no gross intraparenchymal hemorrhages. However, there was suspicion of high density along the posterior temporal cortical and sulcal regions. A final control arteriogram performed through the [facility] guide catheter in the left vertebral artery demonstrated improved flow through the left posterior cerebral artery stented segment though slow. Also noted was slightly improved flow through the right superior cerebellar artery. Throughout the procedure, the patient's neurological and hemodynamic status remained stable. No extravasation of contrast or of mass-effect on the major vessel was noted. The 8 [place] FlowGate guide catheter and the 8 [place] neurovascular sheath were then retrieved into the abdominal aorta and exchanged over a J-tip guidewire for an 8 [place] Pinnacle sheath. This was then connected to continuous heparinized saline infusion. At the end of the procedure, the patient's right groin appeared soft without evidence of a hematoma. Distal pulses were Dopplerable in the dorsalis pedis and posterior tibial regions unchanged from prior to the procedure. The  patient was then transferred to the CT scanner for postprocedural CT scan of the brain. IMPRESSION: Endovascular complete revascularization of occluded left posterior cerebral artery with 2 passes with a 4 mm x 40 mm Solitaire FR retrieval device, and 1 pass with the 4 mm x 39 mm Embotrap retrieval device, followed by rescue stenting of reocclusion of the left posterior cerebral artery in the P2 P3 regions using an Enterprise stent device. Acute intra stent platelet aggregation treated with 0.75 mg of Aggrastat intra-arterially super selective as described with partial improved flow through the stented segment of the left posterior cerebral artery. PLAN: Patient transported to the CT suite for postprocedural CT scan of the brain. Electronically Signed   By: Luanne Bras M.D.   On: 12/03/2017 10:07   Ct Head Code Stroke Wo Contrast  Result Date: 12/01/2017 CLINICAL DATA:  Code stroke. Right-sided weakness  beginning 30-40 minutes ago at the patient's desk. EXAM: CT HEAD WITHOUT CONTRAST TECHNIQUE: Contiguous axial images were obtained from the base of the skull through the vertex without intravenous contrast. COMPARISON:  None. FINDINGS: Brain: Small remote right cerebellar infarct inferiorly. No visible acute infarct. No hemorrhage, masslike finding, or hydrocephalus. Vascular: Concern for hyperdense left P1 segment, see sagittal reformats. No hyperdensity of MCAs. Skull: Negative Sinuses/Orbits: Negative Other: Case reviewed in person with Dr. Rory Percy.  CTA pending ASPECTS [facility] Stroke Program Early CT Score) left hemisphere - Ganglionic level infarction (caudate, lentiform nuclei, internal capsule, insula, M1-M3 cortex): 7 - Supraganglionic infarction (M4-M6 cortex): 3 Total score (0-10 with 10 being normal): 10 IMPRESSION: 1. Possible hyperdense left P1 segment. 2. Negative for hemorrhage or visible acute infarct. 3. Small remote right inferior cerebellar infarct. Electronically Signed   By: Monte Fantasia M.D.   On: 12/01/2017 16:15   Ir Angio Intra Extracran Sel Com Carotid Innominate Bilat Mod Sed  Result Date: 12/04/2017 INDICATION: Episodic confusion. Right-sided numbness and tingling with mild weakness. Intermittent left visual field blurring. Right homonymous hemianopsia. 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 EMERGENT LARGE VESSEL OCCLUSION THROMBOLYSIS (POSTERIOR CIRCULATION) 2. Rescue stenting of left posterior cerebral artery following thrombectomy COMPARISON:  CT angiogram of the head and neck of 12/01/2017. MEDICATIONS: 2 g of Ancef IV antibiotic was administered within 1 hour of the procedure. ANESTHESIA/SEDATION: General anesthesia. CONTRAST:  Isovue 300 approximately 140 mL. FLUOROSCOPY TIME:  Fluoroscopy Time: 94 minutes 6 seconds (4993 mGy). COMPLICATIONS: None immediate. TECHNIQUE: Following a full explanation of the procedure along with the potential associated complications, an informed witnessed consent was obtained from the patient's spouse. The risks of intracranial hemorrhage of 10%, worsening neurological deficit, ventilator dependency, death and inability to revascularize were all reviewed in detail with the patient's spouse. The patient was then put under general anesthesia by the Department of Anesthesiology at [facility]. The right groin was prepped and draped in the usual sterile fashion. Thereafter using modified Seldinger technique, transfemoral access into the right common femoral artery was obtained without difficulty. Over a 0.035 inch guidewire a 5 [place] Pinnacle sheath was inserted. Through this, and also over a 0.035 inch guidewire a 5 [place] JB 1 catheter was advanced to the aortic arch region and selectively positioned in the left vertebral artery, left common carotid artery, right vertebral artery and right common carotid artery. FINDINGS: The left common carotid arteriogram demonstrates the left external carotid  artery and its major branches to be widely patent. The left internal carotid artery at the bulb to the cranial skull base opacifies normally. The petrous, cavernous and supraclinoid segments demonstrate wide patency. The left middle cerebral artery and the left anterior cerebral artery opacify normally into the capillary and venous phases. No angiographic evidence of a left posterior communicating artery is seen. The left vertebral arteriogram demonstrates the origin of left vertebral artery to be widely patent The vessel is seen to opacify normally to the cranial skull base. Normal opacification is seen in the left posterior-inferior cerebellar artery and the left vertebrobasilar junction. The right posterior cerebral artery, the superior cerebellar arteries and the anterior-inferior cerebellar arteries opacify normally into the capillary and venous phases. 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 Unopacified blood is seen in the basilar artery from the contralateral vertebral artery. The right common carotid  arteriogram demonstrates the right external carotid artery and its major branches to be widely patent. A right internal carotid artery is seen to opacify normally to the cranial skull base. The petrous, cavernous and supraclinoid segments demonstrate wide patency. The right middle cerebral artery and the right anterior cerebral artery opacify into the capillary and venous phases. A very small right posterior communicating artery is seen. The right vertebral artery origin is widely patent. The vessel is seen to opacify normally to the cranial skull base. Normal patency is seen of the right posterior-inferior cerebellar artery and the right vertebrobasilar junction. The opacified portion of the basilar artery, the right posterior cerebral artery, the anterior-inferior cerebellar arteries and  the left superior cerebellar artery opacify. There was flash opacification of the right superior cerebellar artery. Faint opacification is noted within the stented segment of the left posterior cerebral artery. PROCEDURE: ENDOVASCULAR REVASCULARIZATION OF OCCLUDED LEFT POSTERIOR CEREBRAL ARTERY WITH 2 PASSES WITH THE SOLITAIRE FR 4 MM X 40 MM RETRIEVAL DEVICE, AND 1 PASS WITH THE EMBOTRAP RETRIEVAL DEVICE, FOLLOWED BY RESCUE STENTING OF PERSISTENT REOCCLUSION OF THE LEFT POSTERIOR CEREBRAL ARTERY P1 P2 SEGMENT, FOLLOWED BY INTRA-ARTERIAL SUPRASELECTIVE INFUSION OF AGGRASTAT WITH PARTIAL RECANALIZATION OF THE OCCLUDED STENT IN THE LEFT POSTERIOR CEREBRAL ARTERY P1 P2 SEGMENT. The diagnostic JB 1 catheter in the left subclavian artery was then exchanged over a 0.035 inch 300 cm Rosen exchange guidewire for an 8 French 55 cm Brite tip neurovascular sheath using biplane roadmap technique and constant fluoroscopic guidance. Good aspiration obtained from the hub of the Brite tip neurovascular catheter. This was then connected to continuous heparinized saline infusion. Over the [employer] guidewire, a 6 French 80 cm Cook shuttle sheath was then advanced and positioned in the distal left subclavian artery. The guidewire was removed. Good aspiration obtained from the hub of the [place] shuttle sheath. A gentle contrast injection demonstrated no evidence of spasms, dissections or of intraluminal filling defects. Over a 0.014 inch Softip Synchro micro guidewire, a combination of a Catalyst 5 French 115 cm guide catheter inside of which was a Trevo ProVue 021 microcatheter was advanced through the distal portion of the [place] shuttle sheath and into the left vertebral artery. Biplane roadmap technique and constant fluoroscopic guidance was used in addition to continuous heparinized saline infusion. This combination was then advanced without difficulty to the first horizontal portion of the left vertebral artery.  Thereafter using biplane roadmap technique and constant fluoroscopic guidance, in a coaxial manner and with constant heparinized saline infusion, this combination was then navigated with the micro guidewire leading with a J-tip configuration to avoid dissections or inducing spasm. The combination was advanced to the distal basilar artery. Thereafter, with gentle probing with the micro guidewire using a torque device, access was obtained without difficulty into the completely angiographic occluded left posterior cerebral artery. The micro guidewire and the microcatheter were then advanced to the P2 P3 segment of the left posterior cerebral artery. The micro guidewire was removed. Good aspiration obtained from the hub of the microcatheter. This was connected to continuous heparinized saline infusion. A 4 mm x 40 mm Solitaire FR retrieval device was then prepped and purged with heparinized saline infusion. This was then advanced to the distal portion of the microcatheter. The proximal and the distal markers of the retrieval device were then aligned appropriately. A gentle contrast injection through the 5 [place] Catalyst guide catheter demonstrated a TICI 2b reperfusion of the left posterior cerebral artery distribution. Thereafter with  constant aspiration being applied at the hub of the [place] shuttle sheath which had now been advanced to the distal left vertebral artery, and with a 25 mL syringe at the hub of the Catalyst guide catheter, the combination of the retrieval device microcatheter and the 5 [place] guide catheter was then retrieved and removed as constant aspiration was applied with a 60 mL syringe at the hub of the [employer] sheath. Aspiration was continued until the triaxial combination was removed. Good aspiration obtained from the hub of the [facility] shuttle sheath. Specks of clot were noted entangled in the interstices of the retrieval device. Control arteriogram performed through the [place] sheath demonstrated improved left posterior cerebral artery in its P1 region. There was now noted a filling defect in the basilar artery at the origin of the superior cerebellar arteries. The thalamic perforators were widely patent. This prompted a second pass using the same triaxial combination into the left posterior cerebral artery via a [place] shuttle sheath. Again after having ascertained safe position of tip of the microcatheter in the P2 P3 region of the left posterior cerebral artery, the Solitaire retrieval device was then advanced and deployed as mentioned above without difficulty. Again a control arteriogram performed through the 5 [place] Catalyst guide catheter demonstrated a TICI 2b of the left posterior cerebral artery distribution. The previously noted clot in the distal basilar artery at the origins of the superior cerebellar artery was no longer present. Again after having deployed the retrieval device, and having partially captured in the microcatheter, the triaxial combination was retrieved as constant aspiration was applied with a 60 mL syringe at the hub of the [facility] shuttle sheath. The aspiration was also applied with a 25 mL syringe at the hub of the Catalyst guide catheter. This combination was then again retrieved and removed as aspiration was continued. Following free aspiration at the hub of the [facility] shuttle sheath, a control arteriogram performed through the [place] shuttle sheath in the left vertebral artery demonstrated complete revascularization of the distal basilar artery and no change in the left posterior cerebral artery occlusion at the distal P1 segment. Also at this time there is now a filling defect noted within the right superior cerebellar artery with slow flow to the right superior cerebellar artery. At this time, the patient was also given 3 mg of intra-arterial Integrilin into the basilar artery. The [place] system was then replaced with a 8 French  85 cm FlowGate balloon guide catheter which had been prepped with 50% contrast and 50% heparinized saline infusion. This was positioned in the distal vertical segment of the left vertebral artery. A gentle contrast injection demonstrated no evidence of spasms, dissections or filling defects within the left vertebral artery or distally. This was then connected to continuous heparinized saline infusion. At this time, the combination of the 5 [place] Catalyst guide catheter with a 021 Trevo ProVue microcatheter was advanced over a 0.014 inch Softip Synchro micro guidewire to the distal end of the basilar artery. The micro guidewire was again advanced into the P2 P3 region of the left posterior cerebral artery. The guidewire was removed. Good aspiration obtained from the hub of the microcatheter. This was then again connected to continuous heparinized saline infusion. The 5 [place] Catalyst guide catheter was advanced into the distal 1/3 of the basilar artery. A 4 mm x 39 mm Embotrap device was then advanced in a coaxial manner and with constant heparinized  saline infusion using biplane roadmap technique and constant fluoroscopic guidance to the distal end of the microcatheter. The O ring on the delivery microcatheter was then loosened. With slight forward gentle traction with the right hand on the delivery micro guidewire, with the left hand the delivery microcatheter was retrieved unsheathing the retrieval device. A gentle control arteriogram performed through the Catalyst guide catheter again demonstrated distal opacification. The balloon at the distal end of the [facility] guide catheter was then inflated for proximal flow arrest. The proximal portion of the retrieval device was captured into the microcatheter. Thereafter as constant aspiration was applied with a 60 mL syringe at the hub of the [facility] guide catheter, and the 5 [place] Catalyst guide catheter, the combination was retrieved and removed as constant  aspiration was applied. The aspirate contained a long segment of clot grayish black in color. Aspiration was continued as the balloon was deflated in the left vertebral artery. A control arteriogram performed through the 8 [place] FlowGate guide catheter in the left vertebral artery demonstrated opacification of the left superior cerebellar artery and partially the right superior cerebellar artery. There continued to be flow through the posterior cerebral artery. A TICI perfusion was noted in the left posterior cerebral artery distribution. 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 There was slightly improved flow through the right superior cerebellar artery. At this time, it was elected to proceed with rescue stenting of the left posterior cerebral artery The patient was loaded with 180 mg of Brilinta and 81 mg of aspirin via an orogastric tube. A select Plus 5 cm catheter inside of a 5 [place] Catalyst 115 cm guide catheter was advanced over a 0.014 inch Softip Synchro micro guidewire to the distal end of the FlowGate guide catheter in the left vertebral artery. With the micro guidewire leading with a J-tip configuration to avoid dissections or inducing spasm, the combination was navigated to the distal basilar artery. The micro guidewire was then gently advanced using a torque device through the occluded left posterior cerebral artery and into the P2 P3 segment of the posterior cerebral artery followed by the microcatheter. The micro guidewire was then gently retrieved and removed. There was good aspiration of blood from the hub of the microcatheter. This was then connected to continuous heparinized saline infusion. It was decided to proceed with placement of a 4 mm x 39 mm Enterprise Codman stent device. This was then advanced in a coaxial manner and with constant heparinized saline infusion using biplane roadmap  technique and constant fluoroscopic guidance to the distal end of the microcatheter. The distal and the proximal markers of the stent were then identified and positioned such that there was coverage of the entire left posterior cerebral artery P1 segment, and also the proximal P2 segment. The O ring on the delivery micro catheter was then loosened. With slight forward gentle traction with the right hand on the delivery micro guidewire, the microcatheter was then gently retrieved and removed slowly and deliberately until there was complete deployment of the stent. A gentle control arteriogram performed through the 5 [place] guide catheter demonstrated a TICI 3 reperfusion of the left posterior cerebral artery. Patency was noted of the left superior cerebellar artery, and also with marginally improved flow through the right superior cerebellar artery. The microcatheter and the delivery microcatheter were then removed. Control arteriogram performed at approximately 10 minutes following the placement of the stent demonstrated continues flow  through the stented segment of the left posterior cerebral artery with a TICI 3 redistribution. A repeat arteriogram performed 15 minutes later demonstrated slow flow with progression to complete occlusion secondary to irrigation. At this time, it was decided to proceed with intra-arterial infusion of Aggrastat via the 5 [place] guide catheter positioned in the distal basilar artery. 15 cc containing 0.75 mg of Aggrastat were then infused slowly. Follow-up arteriograms were then performed through the 8 [place] FlowGate guide catheter and the left vertebral artery at 10 and 20 minutes after infusion of 0.75 mg of Aggrastat. The latter images demonstrated progressive slow revascularization at the distal end of the stent with slow perfusion of the P3 branches. It was, therefore, decided to infuse no more Aggrastat with concerns regarding increased chance of intracranial hemorrhage. CT was  performed prior to the infusion of the Aggrastat which demonstrated no gross intraparenchymal hemorrhages. However, there was suspicion of high density along the posterior temporal cortical and sulcal regions. A final control arteriogram performed through the [facility] guide catheter in the left vertebral artery demonstrated improved flow through the left posterior cerebral artery stented segment though slow. Also noted was slightly improved flow through the right superior cerebellar artery. Throughout the procedure, the patient's neurological and hemodynamic status remained stable. No extravasation of contrast or of mass-effect on the major vessel was noted. The 8 [place] FlowGate guide catheter and the 8 [place] neurovascular sheath were then retrieved into the abdominal aorta and exchanged over a J-tip guidewire for an 8 [place] Pinnacle sheath. This was then connected to continuous heparinized saline infusion. At the end of the procedure, the patient's right groin appeared soft without evidence of a hematoma. Distal pulses were Dopplerable in the dorsalis pedis and posterior tibial regions unchanged from prior to the procedure. The patient was then transferred to the CT scanner for postprocedural CT scan of the brain. IMPRESSION: Endovascular complete revascularization of occluded left posterior cerebral artery with 2 passes with a 4 mm x 40 mm Solitaire FR retrieval device, and 1 pass with the 4 mm x 39 mm Embotrap retrieval device, followed by rescue stenting of reocclusion of the left posterior cerebral artery in the P2 P3 regions using an Enterprise stent device. Acute intra stent platelet aggregation treated with 0.75 mg of Aggrastat intra-arterially super selective as described with partial improved flow through the stented segment of the left posterior cerebral artery. PLAN: Patient transported to the CT suite for postprocedural CT scan of the brain. Electronically Signed   By: Luanne Bras M.D.   On:  12/03/2017 10:07   Ir Angio Vertebral Sel Vertebral Uni R Mod Sed  Result Date: 12/04/2017 INDICATION: Episodic confusion. Right-sided numbness and tingling with mild weakness. Intermittent left visual field blurring. Right homonymous hemianopsia. 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 EMERGENT LARGE VESSEL OCCLUSION THROMBOLYSIS (POSTERIOR CIRCULATION) 2. Rescue stenting of left posterior cerebral artery following thrombectomy COMPARISON:  CT angiogram of the head and neck of 12/01/2017. MEDICATIONS: 2 g of Ancef IV antibiotic was administered within 1 hour of the procedure. ANESTHESIA/SEDATION: General anesthesia. CONTRAST:  Isovue 300 approximately 140 mL. FLUOROSCOPY TIME:  Fluoroscopy Time: 94 minutes 6 seconds (4993 mGy). COMPLICATIONS: None immediate. TECHNIQUE: Following a full explanation of the procedure along with the potential associated complications, an informed witnessed consent was obtained from the patient's spouse. The risks of intracranial hemorrhage of 10%, worsening neurological deficit, ventilator dependency, death and inability to revascularize were all reviewed in  detail with the patient's spouse. The patient was then put under general anesthesia by the Department of Anesthesiology at [facility]. The right groin was prepped and draped in the usual sterile fashion. Thereafter using modified Seldinger technique, transfemoral access into the right common femoral artery was obtained without difficulty. Over a 0.035 inch guidewire a 5 French Pinnacle sheath was inserted. Through this, and also over a 0.035 inch guidewire a 5 [place] JB 1 catheter was advanced to the aortic arch region and selectively positioned in the left vertebral artery, left common carotid artery, right vertebral artery and right common carotid artery. FINDINGS: The left common carotid arteriogram demonstrates the left external carotid artery and its major branches to be  widely patent. The left internal carotid artery at the bulb to the cranial skull base opacifies normally. The petrous, cavernous and supraclinoid segments demonstrate wide patency. The left middle cerebral artery and the left anterior cerebral artery opacify normally into the capillary and venous phases. No angiographic evidence of a left posterior communicating artery is seen. The left vertebral arteriogram demonstrates the origin of left vertebral artery to be widely patent The vessel is seen to opacify normally to the cranial skull base. Normal opacification is seen in the left posterior-inferior cerebellar artery and the left vertebrobasilar junction. The right posterior cerebral artery, the superior cerebellar arteries and the anterior-inferior cerebellar arteries opacify normally into the capillary and venous phases. 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 Unopacified blood is seen in the basilar artery from the contralateral vertebral artery. The right common carotid arteriogram demonstrates the right external carotid artery and its major branches to be widely patent. A right internal carotid artery is seen to opacify normally to the cranial skull base. The petrous, cavernous and supraclinoid segments demonstrate wide patency. The right middle cerebral artery and the right anterior cerebral artery opacify into the capillary and venous phases. A very small right posterior communicating artery is seen. The right vertebral artery origin is widely patent. The vessel is seen to opacify normally to the cranial skull base. Normal patency is seen of the right posterior-inferior cerebellar artery and the right vertebrobasilar junction. The opacified portion of the basilar artery, the right posterior cerebral artery, the anterior-inferior cerebellar arteries and the left superior cerebellar artery  opacify. There was flash opacification of the right superior cerebellar artery. Faint opacification is noted within the stented segment of the left posterior cerebral artery. PROCEDURE: ENDOVASCULAR REVASCULARIZATION OF OCCLUDED LEFT POSTERIOR CEREBRAL ARTERY WITH 2 PASSES WITH THE SOLITAIRE FR 4 MM X 40 MM RETRIEVAL DEVICE, AND 1 PASS WITH THE EMBOTRAP RETRIEVAL DEVICE, FOLLOWED BY RESCUE STENTING OF PERSISTENT REOCCLUSION OF THE LEFT POSTERIOR CEREBRAL ARTERY P1 P2 SEGMENT, FOLLOWED BY INTRA-ARTERIAL SUPRASELECTIVE INFUSION OF AGGRASTAT WITH PARTIAL RECANALIZATION OF THE OCCLUDED STENT IN THE LEFT POSTERIOR CEREBRAL ARTERY P1 P2 SEGMENT. The diagnostic JB 1 catheter in the left subclavian artery was then exchanged over a 0.035 inch 300 cm Rosen exchange guidewire for an 8 French 55 cm Brite tip neurovascular sheath using biplane roadmap technique and constant fluoroscopic guidance. Good aspiration obtained from the hub of the Brite tip neurovascular catheter. This was then connected to continuous heparinized saline infusion. Over the [employer] guidewire, a 6 French 80 cm Cook shuttle sheath was then advanced and positioned in the distal left subclavian artery. The guidewire was removed. Good aspiration obtained from the hub of  the [place] sheath. A gentle contrast injection demonstrated no evidence of spasms, dissections or of intraluminal filling defects. Over a 0.014 inch Softip Synchro micro guidewire, a combination of a Catalyst 5 French 115 cm guide catheter inside of which was a Trevo ProVue 021 microcatheter was advanced through the distal portion of the [place] shuttle sheath and into the left vertebral artery. Biplane roadmap technique and constant fluoroscopic guidance was used in addition to continuous heparinized saline infusion. This combination was then advanced without difficulty to the first horizontal portion of the left vertebral artery. Thereafter using biplane roadmap technique  and constant fluoroscopic guidance, in a coaxial manner and with constant heparinized saline infusion, this combination was then navigated with the micro guidewire leading with a J-tip configuration to avoid dissections or inducing spasm. The combination was advanced to the distal basilar artery. Thereafter, with gentle probing with the micro guidewire using a torque device, access was obtained without difficulty into the completely angiographic occluded left posterior cerebral artery. The micro guidewire and the microcatheter were then advanced to the P2 P3 segment of the left posterior cerebral artery. The micro guidewire was removed. Good aspiration obtained from the hub of the microcatheter. This was connected to continuous heparinized saline infusion. A 4 mm x 40 mm Solitaire FR retrieval device was then prepped and purged with heparinized saline infusion. This was then advanced to the distal portion of the microcatheter. The proximal and the distal markers of the retrieval device were then aligned appropriately. A gentle contrast injection through the 5 [place] Catalyst guide catheter demonstrated a TICI 2b reperfusion of the left posterior cerebral artery distribution. Thereafter with constant aspiration being applied at the hub of the [place] shuttle sheath which had now been advanced to the distal left vertebral artery, and with a 25 mL syringe at the hub of the Catalyst guide catheter, the combination of the retrieval device microcatheter and the 5 [place] guide catheter was then retrieved and removed as constant aspiration was applied with a 60 mL syringe at the hub of the [employer] sheath. Aspiration was continued until the triaxial combination was removed. Good aspiration obtained from the hub of the [facility] shuttle sheath. Specks of clot were noted entangled in the interstices of the retrieval device. Control arteriogram performed through the [place] sheath demonstrated improved left  posterior cerebral artery in its P1 region. There was now noted a filling defect in the basilar artery at the origin of the superior cerebellar arteries. The thalamic perforators were widely patent. This prompted a second pass using the same triaxial combination into the left posterior cerebral artery via a Cook shuttle sheath. Again after having ascertained safe position of tip of the microcatheter in the P2 P3 region of the left posterior cerebral artery, the Solitaire retrieval device was then advanced and deployed as mentioned above without difficulty. Again a control arteriogram performed through the 5 [place] Catalyst guide catheter demonstrated a TICI 2b of the left posterior cerebral artery distribution. The previously noted clot in the distal basilar artery at the origins of the superior cerebellar artery was no longer present. Again after having deployed the retrieval device, and having partially captured in the microcatheter, the triaxial combination was retrieved as constant aspiration was applied with a 60 mL syringe at the hub of the [place] shuttle sheath. The aspiration was also applied with a 25 mL syringe at the hub of the Catalyst guide catheter. This combination was  then again retrieved and removed as aspiration was continued. Following free aspiration at the hub of the [facility] shuttle sheath, a control arteriogram performed through the [place] shuttle sheath in the left vertebral artery demonstrated complete revascularization of the distal basilar artery and no change in the left posterior cerebral artery occlusion at the distal P1 segment. Also at this time there is now a filling defect noted within the right superior cerebellar artery with slow flow to the right superior cerebellar artery. At this time, the patient was also given 3 mg of intra-arterial Integrilin into the basilar artery. The [place] system was then replaced with a 8 French 85 cm FlowGate balloon guide catheter  which had been prepped with 50% contrast and 50% heparinized saline infusion. This was positioned in the distal vertical segment of the left vertebral artery. A gentle contrast injection demonstrated no evidence of spasms, dissections or filling defects within the left vertebral artery or distally. This was then connected to continuous heparinized saline infusion. At this time, the combination of the 5 [place] Catalyst guide catheter with a 021 Trevo ProVue microcatheter was advanced over a 0.014 inch Softip Synchro micro guidewire to the distal end of the basilar artery. The micro guidewire was again advanced into the P2 P3 region of the left posterior cerebral artery. The guidewire was removed. Good aspiration obtained from the hub of the microcatheter. This was then again connected to continuous heparinized saline infusion. The 5 [place] Catalyst guide catheter was advanced into the distal 1/3 of the basilar artery. A 4 mm x 39 mm Embotrap device was then advanced in a coaxial manner and with constant heparinized saline infusion using biplane roadmap technique and constant fluoroscopic guidance to the distal end of the microcatheter. The O ring on the delivery microcatheter was then loosened. With slight forward gentle traction with the right hand on the delivery micro guidewire, with the left hand the delivery microcatheter was retrieved unsheathing the retrieval device. A gentle control arteriogram performed through the Catalyst guide catheter again demonstrated distal opacification. The balloon at the distal end of the [facility] guide catheter was then inflated for proximal flow arrest. The proximal portion of the retrieval device was captured into the microcatheter. Thereafter as constant aspiration was applied with a 60 mL syringe at the hub of the [facility] guide catheter, and the 5 [place] Catalyst guide catheter, the combination was retrieved and removed as constant aspiration was applied. The aspirate  contained a long segment of clot grayish black in color. Aspiration was continued as the balloon was deflated in the left vertebral artery. A control arteriogram performed through the 8 [place] FlowGate guide catheter in the left vertebral artery demonstrated opacification of the left superior cerebellar artery and partially the right superior cerebellar artery. There continued to be flow through the posterior cerebral artery. A TICI perfusion was noted in the left posterior cerebral artery distribution. 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 There was slightly improved flow through the right superior cerebellar artery. At this time, it was elected to proceed with rescue stenting of the left posterior cerebral artery The patient was loaded with 180 mg of Brilinta and 81 mg of aspirin via an orogastric tube. A select Plus 5 cm catheter inside of a 5 [place] Catalyst 115 cm guide catheter was advanced over a 0.014 inch Softip Synchro micro guidewire to the distal end of the [facility] guide catheter in  the left vertebral artery. With the micro guidewire leading with a J-tip configuration to avoid dissections or inducing spasm, the combination was navigated to the distal basilar artery. The micro guidewire was then gently advanced using a torque device through the occluded left posterior cerebral artery and into the P2 P3 segment of the posterior cerebral artery followed by the microcatheter. The micro guidewire was then gently retrieved and removed. There was good aspiration of blood from the hub of the microcatheter. This was then connected to continuous heparinized saline infusion. It was decided to proceed with placement of a 4 mm x 39 mm Enterprise Codman stent device. This was then advanced in a coaxial manner and with constant heparinized saline infusion using biplane roadmap technique and constant fluoroscopic  guidance to the distal end of the microcatheter. The distal and the proximal markers of the stent were then identified and positioned such that there was coverage of the entire left posterior cerebral artery P1 segment, and also the proximal P2 segment. The O ring on the delivery micro catheter was then loosened. With slight forward gentle traction with the right hand on the delivery micro guidewire, the microcatheter was then gently retrieved and removed slowly and deliberately until there was complete deployment of the stent. A gentle control arteriogram performed through the 5 [place] guide catheter demonstrated a TICI 3 reperfusion of the left posterior cerebral artery. Patency was noted of the left superior cerebellar artery, and also with marginally improved flow through the right superior cerebellar artery. The microcatheter and the delivery microcatheter were then removed. Control arteriogram performed at approximately 10 minutes following the placement of the stent demonstrated continues flow through the stented segment of the left posterior cerebral artery with a TICI 3 redistribution. A repeat arteriogram performed 15 minutes later demonstrated slow flow with progression to complete occlusion secondary to irrigation. At this time, it was decided to proceed with intra-arterial infusion of Aggrastat via the 5 [place] guide catheter positioned in the distal basilar artery. 15 cc containing 0.75 mg of Aggrastat were then infused slowly. Follow-up arteriograms were then performed through the 8 [place] FlowGate guide catheter and the left vertebral artery at 10 and 20 minutes after infusion of 0.75 mg of Aggrastat. The latter images demonstrated progressive slow revascularization at the distal end of the stent with slow perfusion of the P3 branches. It was, therefore, decided to infuse no more Aggrastat with concerns regarding increased chance of intracranial hemorrhage. CT was performed prior to the infusion of  the Aggrastat which demonstrated no gross intraparenchymal hemorrhages. However, there was suspicion of high density along the posterior temporal cortical and sulcal regions. A final control arteriogram performed through the [facility] guide catheter in the left vertebral artery demonstrated improved flow through the left posterior cerebral artery stented segment though slow. Also noted was slightly improved flow through the right superior cerebellar artery. Throughout the procedure, the patient's neurological and hemodynamic status remained stable. No extravasation of contrast or of mass-effect on the major vessel was noted. The 8 [place] FlowGate guide catheter and the 8 [place] neurovascular sheath were then retrieved into the abdominal aorta and exchanged over a J-tip guidewire for an 8 [place] Pinnacle sheath. This was then connected to continuous heparinized saline infusion. At the end of the procedure, the patient's right groin appeared soft without evidence of a hematoma. Distal pulses were Dopplerable in the dorsalis pedis and posterior tibial regions unchanged from prior to the procedure. The patient was then transferred to the CT  scanner for postprocedural CT scan of the brain. IMPRESSION: Endovascular complete revascularization of occluded left posterior cerebral artery with 2 passes with a 4 mm x 40 mm Solitaire FR retrieval device, and 1 pass with the 4 mm x 39 mm Embotrap retrieval device, followed by rescue stenting of reocclusion of the left posterior cerebral artery in the P2 P3 regions using an Enterprise stent device. Acute intra stent platelet aggregation treated with 0.75 mg of Aggrastat intra-arterially super selective as described with partial improved flow through the stented segment of the left posterior cerebral artery. PLAN: Patient transported to the CT suite for postprocedural CT scan of the brain. Electronically Signed   By: Luanne Bras M.D.   On: 12/03/2017 10:07     Labs:  CBC: Recent Labs    12/01/17 1550 12/01/17 1600 12/02/17 0535 12/03/17 0422 12/04/17 0250  WBC 10.5  --  12.5* 12.6* 9.2  HGB 17.1* 17.3* 14.2 13.9 13.8  HCT 48.8 51.0 40.8 40.8 39.1  PLT 243  --  268 196 170    COAGS: Recent Labs    12/01/17 1550  INR 0.92  APTT 27    BMP: Recent Labs    12/01/17 1550 12/01/17 1600 12/02/17 0535 12/02/17 1600 12/03/17 0422 12/04/17 0250  NA 139 142 141  --  140 143  K 3.4* 3.6 3.3* 3.7 3.5 3.7  CL 100* 98* 111  --  110 111  CO2 26  --  21*  --  21* 23  GLUCOSE 161* 163* 93  --  84 121*  BUN 18 22* 15  --  8 14  CALCIUM 9.0  --  7.3*  --  7.6* 8.3*  CREATININE 1.08 1.00 0.90  --  0.88 0.73  GFRNONAA >60  --  >60  --  >60 >60  GFRAA >60  --  >60  --  >60 >60    LIVER FUNCTION TESTS: Recent Labs    12/01/17 1550  BILITOT 2.1*  AST 39  ALT 51  ALKPHOS 68  PROT 7.0  ALBUMIN 3.9    Assessment and Plan:  CVA L PCA revasc and stent 1/26 in IR Up on side of bed with PT Plan per Stroke team   Electronically Signed: Terie Lear A, PA-C 12/04/2017, 11:30 AM   I spent a total of 15 Minutes at the the patient's bedside AND on the patient's hospital floor or unit, greater than 50% of which was counseling/coordinating care for CVA; L PCA revasc

## 2017-12-04 NOTE — Progress Notes (Signed)
Inpatient Rehabilitation  Per SLP, PT request patient was screened by Gunnar Fusi for appropriateness for an Inpatient Acute Rehab consult.  At this time we are recommending an Inpatient Rehab consult.  Please order if you are agreeable.    Carmelia Roller., CCC/SLP Admission Coordinator  [place]  Cell [phone]

## 2017-12-04 NOTE — Progress Notes (Signed)
STROKE TEAM PROGRESS NOTE   SUBJECTIVE (INTERVAL HISTORY) RN and vascular tech are at the bedside. Pt extubated yesterday and tolerating well. Mildly lethargic not quite cooperative on visual field testing. DVT neg. Will need TEE and loop.     OBJECTIVE Temp:  [97.6 F (36.4 C)-100.4 F (38 C)] 99.3 F (37.4 C) (01/29 0800) Pulse Rate:  [75-99] 77 (01/29 1000) Cardiac Rhythm: (P) Normal sinus rhythm (01/29 0800) Resp:  [12-23] 15 (01/29 1000) BP: (79-146)/(48-91) 123/88 (01/29 1000) SpO2:  [92 %-100 %] 96 % (01/29 1000) Arterial Line BP: (134-163)/(66-85) 149/83 (01/29 0800) FiO2 (%):  [40 %] 40 % (01/28 1142) Weight:  [311 lb 8.2 oz (141.3 kg)] 311 lb 8.2 oz (141.3 kg) (01/29 0500)  CBC:  Recent Labs  Lab 12/01/17 1550  12/03/17 0422 12/04/17 0250  WBC 10.5   < > 12.6* 9.2  NEUTROABS 7.0  --  10.3*  --   HGB 17.1*   < > 13.9 13.8  HCT 48.8   < > 40.8 39.1  MCV 88.7   < > 91.7 89.3  PLT 243   < > 196 170   < > = values in this interval not displayed.    Basic Metabolic Panel:  Recent Labs  Lab 12/03/17 0422 12/03/17 1109 12/04/17 0250  NA 140  --  143  K 3.5  --  3.7  CL 110  --  111  CO2 21*  --  23  GLUCOSE 84  --  121*  BUN 8  --  14  CREATININE 0.88  --  0.73  CALCIUM 7.6*  --  8.3*  MG 1.9 1.9  --   PHOS  --  3.2 2.6    Lipid Panel:     Component Value Date/Time   CHOL 144 12/02/2017 0535   TRIG 246 (H) 12/02/2017 0535   TRIG 244 (H) 12/02/2017 0535   HDL 37 (L) 12/02/2017 0535   CHOLHDL 3.9 12/02/2017 0535   VLDL 49 (H) 12/02/2017 0535   LDLCALC 58 12/02/2017 0535   HgbA1c:  Lab Results  Component Value Date   HGBA1C 5.7 (H) 12/02/2017   Urine Drug Screen: No results found for: LABOPIA, COCAINSCRNUR, LABBENZ, AMPHETMU, THCU, LABBARB  Alcohol Level No results found for: [place] I have personally reviewed the radiological images below and agree with the radiology interpretations.  Ct Angio Head W Or Wo Contrast Ct Angio Neck W Or Wo  Contrast  Ct Cerebral Perfusion W Contrast 12/01/2017 IMPRESSION:  1. 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 14 cc of left PCA distribution penumbra with no infarct by CTP.  2. 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 No embolic source identified in the head or neck. No atheromatous changes or chronic stenoses.  3. 2.5 cm right thyroid nodule, recommend sonographic follow-up.   Ct Head Wo Contrast 12/01/2017 IMPRESSION:  1. Subarachnoid hyperdensity within the posterior left temporal lobe and left occipital lobe, which could be contrast staining or petechial/subarachnoid hemorrhage.  2. No herniation, hydrocephalus or intraparenchymal hematoma.   Ct Head Code Stroke Wo Contrast 12/01/2017 IMPRESSION:  1. Possible hyperdense left P1 segment.  2. Negative for hemorrhage or visible acute infarct.  3. Small remote right inferior cerebellar infarct.   Mri and Mra Head Wo Contrast 12/03/2017 IMPRESSION: 1. Multifocal acute ischemia within the posterior circulation territories, predominantly within the left PCA distribution, including the left occipital lobe and left thalamus. Numerous smaller foci of acute ischemia throughout both  cerebellar hemispheres. 2. Minimal susceptibility blooming in the left occipital lobe, likely indicating the presence of a minimal amount of petechial blood. 3. Stent within the P1 segment of the left PCA without flow related enhancement distal to the stent. The stent may be occluded. This could be further assessed with CTA of the head, if clinically warranted.   CT HEAD  1. Continued decrease in subarachnoid high-density along the posterior left cerebral hemisphere. No hydrocephalus. 2. Moderate acute left occipital infarct without detected progression. 3. Question small right superior cerebellar infarct.  Transthoracic Echocardiogram - Left ventricle: The cavity size was normal. Wall thickness was   increased in a pattern of mild LVH.  Systolic function was normal.   The estimated ejection fraction was in the range of 55% to 60%.   Wall motion was normal; there were no regional wall motion   abnormalities. Left ventricular diastolic function parameters   were normal. - Aortic valve: Moderately calcified annulus. Trileaflet;   moderately thickened leaflets. There was moderate stenosis. There   was mild regurgitation. Mean gradient (S): 22 mm Hg. Valve area   (VTI): 1.36 cm^2. Valve area (Vmax): 1.17 cm^2. Valve area   (Vmean): 1.25 cm^2. - Mitral valve: Mildly calcified annulus. Mildly thickened leaflets  Cerebral angiogram - Dr. Estanislado Pandy 12/01/2017 S/P bilateral common carotid and vertebral artery angiograms,followed  By partial revascularization of Lt PCA with x 2 passes with solitaire 89mm x 40 mm retriever device ,x 1 pass with the embotrap retriever device  And placement of intracranial stent in the Lt PCA for recurrent occlusion after retriever passes achieving a TICI 2b re pertfusion,followe by .75 mg og aggrastat IA into the basilar artery with slow revascularization of intrastent occlusion.due to acute platelet aggregation. Also loaded with brilinta 180 mg and aspirin 81 mg via orogastric tube prior to stent placement   PHYSICAL EXAM Vitals:   12/04/17 0600 12/04/17 0800 12/04/17 0900 12/04/17 1000  BP: (!) 114/91 129/83 119/87 123/88  Pulse: 78 83 77 77  Resp: 16 20 18 15   Temp:  99.3 F (37.4 C)    TempSrc:  Axillary    SpO2: 96% 96% 96% 96%  Weight:      Height:        Temp:  [97.6 F (36.4 C)-100.4 F (38 C)] 99.3 F (37.4 C) (01/29 0800) Pulse Rate:  [75-99] 77 (01/29 1000) Resp:  [12-23] 15 (01/29 1000) BP: (79-146)/(48-91) 123/88 (01/29 1000) SpO2:  [92 %-100 %] 96 % (01/29 1000) Arterial Line BP: (134-163)/(66-85) 149/83 (01/29 0800) FiO2 (%):  [40 %] 40 % (01/28 1142) Weight:  [311 lb 8.2 oz (141.3 kg)] 311 lb 8.2 oz (141.3 kg) (01/29 0500)  General - Well nourished, well developed,  mildly lethargic.  Ophthalmologic - Fundi not visualized due to small pupils.  Cardiovascular - Regular rate and rhythm.  Neuro - awake alert but mildly lethargic. Eyes open and following simple commands. PERRL, seems to have right upper quadrantanopia but not quite cooperative on exam. Eye moving both directions, no gaze preference. No facial droop or tongue deviation. LUE and LLE 5/5. RUE 4/5 proximal with pronator drift but 5/5 distal. RLE 5/5. DTR 1+ and no babinski. Sensation decreased on the right, coordination intact and gait not tested.    ASSESSMENT/PLAN [name] is a 52 y.o. male with history of asthma, hypertension, and obstructive sleep apnea (wears C Pap) presenting with right-sided numbness.  The patient received TPA on Saturday, 12/01/2017 at [facility]. -> Interventional radiology.  Stroke: Left PCA infarct with P1 occlusion s/p tPA and IR with TICI 2b reperfusion and left P1 stent - possibly embolic - source unknown. Additional right SCA and b/l PICA infarcts likely due to procedure  Resultant right hemiparesthesia, right upper quadrantanopia  CT head repeat decreased subarachnoid hyperdensity, left PCA infact  MRI head - left PCA, right SCA and b/l PICA infarcts  MRA head - left PCA occlusion s/p stent, right SCA occlusion  2D Echo - EF 55-60%  LE venous doppler no DVT  Hypercoagulable work up pending  Will need TEE and loop   LDL - 58  HgbA1c - 5.7  VTE prophylaxis - heparin subq Fall precautions Diet clear liquid Room service appropriate? Yes; Fluid consistency: Thin  No antithrombotic prior to admission, now on aspirin 81 mg daily and Brilinta.   Patient counseled to be compliant with his antithrombotic medications  Ongoing aggressive stroke risk factor management  Therapy recommendations:  pending  Disposition:  Pending  Respiratory failure  Intubated for procedure  Failed weaning yesterday am  Extubated in pm   tolerating  well  Hypertension  Stable  Permissive hypertension (OK if < 180/105) but gradually normalize in 5-7 days  Long-term BP goal normotensive  Other Stroke Risk Factors  Obesity, Body mass index is 44.7 kg/m., recommend weight loss, diet and exercise as appropriate   Hx stroke/TIA - remote right PICA infarct by imaging  OSA, on CPAP at home  Other Active Problems  Hypokalemia - 3.3 - supplement -> 3.7  Mild leukocytosis - 12.5->9.2   Hospital day # 3  This patient is critically ill due to right PCA infarct s/p tPA and IR, intubated and at significant risk of neurological worsening, death form recurrent stroke, hemorrhagic conversion, seizure, respiratory failure. This patient's care requires constant monitoring of vital signs, hemodynamics, respiratory and cardiac monitoring, review of multiple databases, neurological assessment, discussion with family, other specialists and medical decision making of high complexity. I had long discussion with mom and wife at bedside, updated pt current condition, treatment plan and potential prognosis. They expressed understanding and appreciation. I spent 35 minutes of neurocritical care time in the care of this patient.  Rosalin Hawking, MD PhD Stroke Neurology 12/04/2017 10:59 AM    To contact Stroke Continuity provider, please refer to [email or web address] After hours, contact General Neurology

## 2017-12-04 NOTE — Evaluation (Signed)
Speech Language Pathology Evaluation Patient Details Name: William Lowery MRN: [id number] DOB: [birth year] Today's Date: 12/04/2017 Time: 1884-1660 SLP Time Calculation (min) (ACUTE ONLY): 22 min  Problem List:  Patient Active Problem List   Diagnosis Date Noted  . Stroke (cerebrum) ([place]) 12/01/2017  . Acute arterial ischemic stroke, multifocal, posterior circulation ([place]) 12/01/2017   Past Medical History:  Past Medical History:  Diagnosis Date  . Asthma    hx of  . Heart murmur    as an infant  . Hypertension   . Sleep apnea    wears CPAP   Past Surgical History:  Past Surgical History:  Procedure Laterality Date  . bellybutton procedure     as a newborn  . IR ANGIO INTRA EXTRACRAN SEL COM CAROTID INNOMINATE BILAT MOD SED  12/01/2017  . IR ANGIO VERTEBRAL SEL VERTEBRAL UNI R MOD SED  12/01/2017  . IR CT HEAD LTD  12/01/2017  . IR PERCUTANEOUS ART THROMBECTOMY/INFUSION INTRACRANIAL INC DIAG ANGIO  12/01/2017  . RADIOLOGY WITH ANESTHESIA N/A 12/01/2017   Procedure: RADIOLOGY WITH ANESTHESIA CODE STROKE;  Surgeon: Radiologist, Medication, MD;  Location: [facility];  Service: Radiology;  Laterality: N/A;   HPI:  52 year old male with PMH of HTN, HLD, OSA/OHS with CPAP at HS, Asthma Presentedto ED on 1/26 with numbness to right face, arm, and leg. CT Head with Occlusion to Left P1 segment of the PCA. Given TPA. Taken to IR for revascularization. Stent placed in the left PCA.  Remained intubated post IR procedure, extubated 1/28. MRI 1/28: Multifocal acute ischemia within the posterior circulation territories, predominantly within the left PCA distribution, including the left occipital lobe and left thalamus. Numerous smaller foci of acute ischemia throughout both cerebellar hemispheres.   Assessment / Plan / Recommendation Clinical Impression  Pt presents with a mild-moderate aphasia marked by higher level deficits in comprehension for complex stimuli; slow, deliberate expression with intact  grammatical form but dysnomia during sentence formation.  Mild difficulty with naming tasks, increasing difficulty as complexity of task increases (mild difficulty with naming items in room, significant difficulty generating list of items in a category).  No dysarthria.  Inattention to left noted. Pt will benefit from acute SLP services to address basic communication.  Will await OT/PT recs for disposition - from SLP perspective, he would benefit from CIR.     SLP Assessment  SLP Recommendation/Assessment: Patient needs continued Speech Lanaguage Pathology Services    Follow Up Recommendations  Inpatient Rehab    Frequency and Duration min 2x/week  2 weeks      SLP Evaluation Cognition  Overall Cognitive Status: Impaired/Different from baseline Arousal/Alertness: Awake/alert Orientation Level: Oriented to person;Oriented to situation;Oriented to place;Disoriented to time Attention: Sustained Sustained Attention: Impaired Sustained Attention Impairment: Verbal basic       Comprehension  Auditory Comprehension Overall Auditory Comprehension: Impaired Yes/No Questions: Within Functional Limits Commands: Impaired Two Step Basic Commands: 50-74% accurate Other Conversation Comments: (difficulty with left/right discrimination) Reading Comprehension Reading Status: Not tested    Expression Expression Primary Mode of Expression: Verbal Verbal Expression Overall Verbal Expression: Impaired Initiation: No impairment Automatic Speech: (difficulty shifting between categories and completing sequen) Level of Generative/Spontaneous Verbalization: Phrase(slow, deliberate) Repetition: Impaired(with low probability sentences) Level of Impairment: Sentence level Naming: Impairment Responsive: 76-100% accurate Confrontation: Impaired Convergent: 75-100% accurate Divergent: 0-24% accurate Pragmatics: No impairment Written Expression Dominant Hand: Right Written Expression: Not tested    Oral / Motor  Motor Speech Overall Motor Speech: Appears within functional limits  for tasks assessed   GO                    William Lowery 12/04/2017, 10:58 AM  William Lowery, [place] CCC/SLP Pager [phone]

## 2017-12-04 NOTE — Evaluation (Signed)
Physical Therapy Evaluation Patient Details Name: William Lowery MRN: [id number] DOB: [birth year] Today's Date: 12/04/2017   History of Present Illness  52 y.o. male with PMHx: HTN, sleep apnea, right cerebellar CVA. Admitted with L PCA, R SCA, bil PICA infarcts s/p tPA and thrombectomy with occluded stents, intubated 1/26-1/28  Clinical Impression  [name] demonstrates decreased vision, cognition, expressive aphasia, decreased awareness of right side, decreased RUE function who will benefit from acute therapy to maximize mobility, balance, gait and independence to decrease burden of care. Pt with slow processing who benefits from increased time for cues and commands. Pt with good bil LE strength and assist for balance and direction. Will continue to follow and recommend OOB daily with nursing assist.  BP 121/76 sitting    Follow Up Recommendations CIR;Supervision/Assistance - 24 hour    Equipment Recommendations  Rolling walker with 5" wheels    Recommendations for Other Services Rehab consult     Precautions / Restrictions Precautions Precautions: Fall Precaution Comments: right visual field cut, aphasia Restrictions Weight Bearing Restrictions: No      Mobility  Bed Mobility Overal bed mobility: Needs Assistance Bed Mobility: Supine to Sit     Supine to sit: Min assist;HOB elevated     General bed mobility comments: min HHA to fully elevate trunk with increased time and cues to pivot from supine to sit with HOB 25degrees  Transfers Overall transfer level: Needs assistance   Transfers: Sit to/from Stand Sit to Stand: Min assist;+2 safety/equipment         General transfer comment: min assist to stand from bed and chair with cues for hand placement, safety, increased time to achieve full knee extension and upright posture  Ambulation/Gait Ambulation/Gait assistance: Min assist;+2 safety/equipment Ambulation Distance (Feet): 15 Feet Assistive device: Rolling walker (2  wheeled) Gait Pattern/deviations: Shuffle;Wide base of support   Gait velocity interpretation: Below normal speed for age/gender General Gait Details: cues for position in RW, posture and safety with assist to direct RW and chair to follow. Pt fatigues quickly  Stairs            Wheelchair Mobility    Modified Rankin (Stroke Patients Only) Modified Rankin (Stroke Patients Only) Pre-Morbid Rankin Score: No symptoms Modified Rankin: Moderately severe disability     Balance Overall balance assessment: Needs assistance   Sitting balance-Leahy Scale: Poor Sitting balance - Comments: pt able to sit EOB without assist with tendency for right posterior lean with verbal and tactile cues to correct Postural control: Posterior lean;Right lateral lean   Standing balance-Leahy Scale: Poor Standing balance comment: reliant on bil UE support for standing                             Pertinent Vitals/Pain Pain Assessment: No/denies pain    Home Living Family/patient expects to be discharged to:: Private residence Living Arrangements: Spouse/significant other Available Help at Discharge: Family;Available 24 hours/day Type of Home: House Home Access: Stairs to enter   [employer] of Steps: 2 Home Layout: One level Home Equipment: None      Prior Function Level of Independence: Independent         Comments: ADLs, IADLs, driving, and works full time and owns [employer]     Hand Dominance   Dominant Hand: Right    Extremity/Trunk Assessment   Upper Extremity Assessment Upper Extremity Assessment: Defer to OT evaluation    Lower Extremity Assessment Lower Extremity Assessment: Overall  WFL for tasks assessed    Cervical / Trunk Assessment Cervical / Trunk Assessment: (forward head, rounded shoulders)  Communication   Communication: Expressive difficulties  Cognition Arousal/Alertness: Awake/alert Behavior During Therapy: Flat  affect Overall Cognitive Status: Difficult to assess Area of Impairment: Orientation;Memory;Following commands;Safety/judgement;Problem solving                 Orientation Level: Disoriented to;Time;Situation   Memory: Decreased short-term memory Following Commands: Follows one step commands consistently;Follows one step commands with increased time Safety/Judgement: Decreased awareness of deficits;Decreased awareness of safety   Problem Solving: Slow processing;Requires verbal cues;Requires tactile cues        General Comments      Exercises     Assessment/Plan    PT Assessment Patient needs continued PT services  PT Problem List Decreased mobility;Decreased safety awareness;Decreased activity tolerance;Decreased balance;Decreased knowledge of use of DME;Decreased cognition;Decreased coordination       PT Treatment Interventions Gait training;Therapeutic exercise;Patient/family education;Balance training;Functional mobility training;Neuromuscular re-education;DME instruction;Therapeutic activities;Cognitive remediation    PT Goals (Current goals can be found in the Care Plan section)  Acute Rehab PT Goals Patient Stated Goal: return to work PT Goal Formulation: With patient/family Time For Goal Achievement: 12/18/17 Potential to Achieve Goals: Fair    Frequency Min 4X/week   Barriers to discharge        Co-evaluation PT/OT/SLP Co-Evaluation/Treatment: Yes Reason for Co-Treatment: Complexity of the patient's impairments (multi-system involvement);For patient/therapist safety PT goals addressed during session: Mobility/safety with mobility;Balance;Proper use of DME         AM-PAC PT "6 Clicks" Daily Activity  Outcome Measure Difficulty turning over in bed (including adjusting bedclothes, sheets and blankets)?: A Lot Difficulty moving from lying on back to sitting on the side of the bed? : Unable Difficulty sitting down on and standing up from a chair with  arms (e.g., wheelchair, bedside commode, etc,.)?: Unable Help needed moving to and from a bed to chair (including a wheelchair)?: A Lot Help needed walking in hospital room?: A Lot Help needed climbing 3-5 steps with a railing? : A Lot 6 Click Score: 10    End of Session Equipment Utilized During Treatment: Gait belt Activity Tolerance: Patient tolerated treatment well Patient left: in chair;with call bell/phone within reach;with chair alarm set;with family/visitor present Nurse Communication: Mobility status PT Visit Diagnosis: Other abnormalities of gait and mobility (R26.89);Unsteadiness on feet (R26.81);Other symptoms and signs involving the nervous system (R29.898)    Time: 4627-0350 PT Time Calculation (min) (ACUTE ONLY): 34 min   Charges:   PT Evaluation $PT Eval Moderate Complexity: 1 Mod     PT G Codes:        Elwyn Reach, PT [phone]   Lesslie B Orien Mayhall 12/04/2017, 12:13 PM

## 2017-12-04 NOTE — Progress Notes (Signed)
I met with pt, his wife, pt's son and pt's parents at bedside. We discussed goals and expectations of an inpt rehab admit. Wife stepped out of the room to discuss pt's insurance and that pt's parents and she are having a lot of tension even prior to this admit. All things concerning pt's care is to be directed through pt and his wife. I will follow up tomorrow. [phone]

## 2017-12-04 NOTE — Progress Notes (Signed)
SLP Cancellation Note  Patient Details Name: William Lowery MRN: [id number] DOB: 1966-07-31   Cancelled treatment:       Reason Eval/Treat Not Completed: Other (comment) Order received for clinical swallow evaluation.  Pt passed RN stroke swallow screen, therefore SLP eval not indicated per protocol.  Spoke with RN, who will reconsult our services if pt is having new difficulties.   [name] 12/04/2017, 1:21 PM

## 2017-12-04 NOTE — Consult Note (Signed)
Physical Medicine and Rehabilitation Consult Reason for Consult:Decreased functional mobility Referring Physician: Dr.Xu   HPI: William Lowery is a 52 y.o.right handed male with history of hypertension, sleep apnea, obesity.Per chart review patient lives with spouse. One level home to steps to entry. Independent prior to admission.He works full time. Presented 12/01/2017 with right-sided numbness as well as intermittent headache and right eye visual blurriness. Cranial CT scan negative for hemorrhage or visible acute infarction. Small remote right inferior cerebellar infarction. CT angiogram head and neck showed left PCA occlusion beginning at its origin. Cerebral angiogram interventional radiology and underwent thrombectomy.Patient remained intubated through 12/03/2017. Follow-up MRI showed multifocal acute ischemia within the posterior circulation territories predominantly within the left PCA distribution, including left occipital lobe and left thalamus.Echocardiogram with ejection fraction of 55-60%. Lower extremity Dopplers negative. Plan is for TEE and loop recorder. Neurology consulted presently maintained on aspirin as well as Brilinta. Tolerating a regular consistency diet. Physical and occupational therapy evaluations completed with recommendations of physical medicine rehabilitation consult.   Review of Systems  Unable to perform ROS: Acuity of condition   Past Medical History:  Diagnosis Date  . Asthma    hx of  . Heart murmur    as an infant  . Hypertension   . Sleep apnea    wears CPAP   Past Surgical History:  Procedure Laterality Date  . bellybutton procedure     as a newborn  . IR ANGIO INTRA EXTRACRAN SEL COM CAROTID INNOMINATE BILAT MOD SED  12/01/2017  . IR ANGIO VERTEBRAL SEL VERTEBRAL UNI R MOD SED  12/01/2017  . IR CT HEAD LTD  12/01/2017  . IR PERCUTANEOUS ART THROMBECTOMY/INFUSION INTRACRANIAL INC DIAG ANGIO  12/01/2017  . RADIOLOGY WITH ANESTHESIA N/A  12/01/2017   Procedure: RADIOLOGY WITH ANESTHESIA CODE STROKE;  Surgeon: Radiologist, Medication, MD;  Location: [facility];  Service: Radiology;  Laterality: N/A;   Family History  Problem Relation Age of Onset  . Colon cancer Neg Hx   . Esophageal cancer Neg Hx   . Rectal cancer Neg Hx   . Stomach cancer Neg Hx    Social History:  reports that  has never smoked. His smokeless tobacco use includes snuff. He reports that he does not drink alcohol or use drugs. Allergies: No Known Allergies Facility-Administered Medications Prior to Admission  Medication Dose Route Frequency Provider Last Rate Last Dose  . 0.9 %  sodium chloride infusion  500 mL Intravenous Continuous Milus Banister, MD       Medications Prior to Admission  Medication Sig Dispense Refill  . B Complex Vitamins (B-COMPLEX/B-12) TABS Take 1 tablet by mouth daily.    . Cholecalciferol (VITAMIN D PO) Take by mouth daily.    Marland Kitchen losartan-hydrochlorothiazide (HYZAAR) 100-25 MG tablet Take 1 tablet by mouth daily.  3  . Omega-3 Fatty Acids (FISH OIL PO) Take by mouth daily.    Marland Kitchen VITAMIN E PO Take by mouth daily.      Home: Home Living Family/patient expects to be discharged to:: Private residence Living Arrangements: Spouse/significant other Available Help at Discharge: Family, Available 24 hours/day Type of Home: House Home Access: Stairs to enter [employer] of Steps: 2 Home Layout: One level Bathroom Shower/Tub: Public librarian, Multimedia programmer: Standard Home Equipment: None  Lives With: Spouse  Functional History: Prior Function Level of Independence: Independent Comments: ADLs, IADLs, driving, and works full time and owns [employer] Functional Status:  Mobility: Bed  Mobility Overal bed mobility: Needs Assistance Bed Mobility: Supine to Sit Supine to sit: Min assist, HOB elevated General bed mobility comments: min HHA to fully elevate trunk with increased time and cues to  pivot from supine to sit with HOB 25degrees Transfers Overall transfer level: Needs assistance Transfers: Sit to/from Stand Sit to Stand: Min assist, +2 safety/equipment General transfer comment: min assist to stand from bed and chair with cues for hand placement, safety, increased time to achieve full knee extension and upright posture Ambulation/Gait Ambulation/Gait assistance: Min assist, +2 safety/equipment Ambulation Distance (Feet): 15 Feet Assistive device: Rolling walker (2 wheeled) Gait Pattern/deviations: Shuffle, Wide base of support General Gait Details: cues for position in RW, posture and safety with assist to direct RW and chair to follow. Pt fatigues quickly Gait velocity interpretation: Below normal speed for age/gender    ADL: ADL Overall ADL's : Needs assistance/impaired Eating/Feeding: Moderate assistance, Sitting Grooming: (P) Wash/dry face, Moderate assistance, Sitting Grooming Details (indicate cue type and reason): (P) Pt washing his face while seated at EOB. Pt with R lateral lean during activity and required tactile cues and Min physical A to correct posture. Pt initating task with L hand and switched to R hand with cues. Pt requiring increased time to bring R hand to face and demosntrating increased effort. Upper Body Bathing: (P) Moderate assistance, Sitting Lower Body Bathing: (P) Maximal assistance, +2 for safety/equipment, Sit to/from stand Upper Body Dressing : (P) Moderate assistance, Sitting Lower Body Dressing: (P) Maximal assistance, +2 for safety/equipment, Sit to/from stand Lower Body Dressing Details (indicate cue type and reason): (P) Max A to don socks. Initating R sock over toes adn then pt bending forward to pull over his heel; demosntrating increased effort and SpO2 dropping.  Toilet Transfer: (P) Minimal assistance, +2 for physical assistance, Stand-pivot, RW, Cueing for safety, Cueing for sequencing Toilet Transfer Details (indicate cue type  and reason): (P) Min A +2 for stand pivot to recliner. Pt requiring significant amount if time and cues Functional mobility during ADLs: (P) Minimal assistance, +2 for physical assistance, Cueing for sequencing, Cueing for safety, Rolling walker General ADL Comments: (P) Pt demonstrating decreased fucntional performance and is impacted by decreased funcitonal use of RUE (dominant), balance, cognition, vision, and activity tolerance. Pt motivated to particiapte in therapy  Cognition: Cognition Overall Cognitive Status: Difficult to assess Arousal/Alertness: Awake/alert Orientation Level: Oriented to person, Oriented to place, Oriented to situation, Disoriented to time(month, not year) Attention: Sustained Sustained Attention: Impaired Sustained Attention Impairment: Verbal basic Cognition Arousal/Alertness: Awake/alert Behavior During Therapy: Flat affect Overall Cognitive Status: Difficult to assess Area of Impairment: Orientation, Memory, Following commands, Safety/judgement, Problem solving, Attention, Awareness Orientation Level: Disoriented to, Time, Situation Current Attention Level: Sustained Memory: Decreased short-term memory Following Commands: Follows one step commands with increased time, Follows one step commands inconsistently Safety/Judgement: Decreased awareness of deficits, Decreased awareness of safety Awareness: Emergent Problem Solving: Slow processing, Requires verbal cues, Requires tactile cues General Comments: Pt with decreased attention and fatigued quickly. Required increased time for processing and needing cues to attend to questions and tasks.  Difficult to assess due to: Impaired communication  Blood pressure 121/76, pulse 88, temperature 99.3 F (37.4 C), temperature source Axillary, resp. rate 18, height 5\' 10"  (1.778 m), weight (!) 141.3 kg (311 lb 8.2 oz), SpO2 94 %. Physical Exam  Constitutional:  51 year old obese male sitting up in chair with 2 L of  oxygen in place  HENT:  Head: Normocephalic.  Eyes:  Pupils sluggish  to light  Neck: Normal range of motion. Neck supple. No thyromegaly present.  Cardiovascular: Normal rate and regular rhythm.  Respiratory:  Limited inspiratory effort clear to auscultation  GI: Soft. Bowel sounds are normal. He exhibits no distension.  Neurological:  Patient is lethargic but did arouse to name but remained nonverbal and would easily fall back asleep. He did on one occasion touch his nose on verbal command. Grimaces to deep palpation. Does move all 4 limbs.   Skin: Skin is warm and dry.    Results for orders placed or performed during the hospital encounter of 12/01/17 (from the past 24 hour(s))  Glucose, capillary     Status: Abnormal   Collection Time: 12/03/17  7:56 PM  Result Value Ref Range   Glucose-Capillary 133 (H) 65 - 99 mg/dL  Glucose, capillary     Status: Abnormal   Collection Time: 12/03/17 11:33 PM  Result Value Ref Range   Glucose-Capillary 115 (H) 65 - 99 mg/dL  CBC     Status: None   Collection Time: 12/04/17  2:50 AM  Result Value Ref Range   WBC 9.2 4.0 - 10.5 K/uL   RBC 4.38 4.22 - 5.81 MIL/uL   Hemoglobin 13.8 13.0 - 17.0 g/dL   HCT 39.1 39.0 - 52.0 %   MCV 89.3 78.0 - 100.0 fL   MCH 31.5 26.0 - 34.0 pg   MCHC 35.3 30.0 - 36.0 g/dL   RDW 12.8 11.5 - 15.5 %   Platelets 170 150 - 400 K/uL  Basic metabolic panel     Status: Abnormal   Collection Time: 12/04/17  2:50 AM  Result Value Ref Range   Sodium 143 135 - 145 mmol/L   Potassium 3.7 3.5 - 5.1 mmol/L   Chloride 111 101 - 111 mmol/L   CO2 23 22 - 32 mmol/L   Glucose, Bld 121 (H) 65 - 99 mg/dL   BUN 14 6 - 20 mg/dL   Creatinine, Ser 0.73 0.61 - 1.24 mg/dL   Calcium 8.3 (L) 8.9 - 10.3 mg/dL   GFR calc non Af Amer >60 >60 mL/min   GFR calc Af Amer >60 >60 mL/min   Anion gap 9 5 - 15  Phosphorus     Status: None   Collection Time: 12/04/17  2:50 AM  Result Value Ref Range   Phosphorus 2.6 2.5 - 4.6 mg/dL    Vitamin B12     Status: Abnormal   Collection Time: 12/04/17  2:50 AM  Result Value Ref Range   Vitamin B-12 1,057 (H) 180 - 914 pg/mL  TSH     Status: Abnormal   Collection Time: 12/04/17  2:50 AM  Result Value Ref Range   TSH 0.288 (L) 0.350 - 4.500 uIU/mL  Glucose, capillary     Status: Abnormal   Collection Time: 12/04/17  3:31 AM  Result Value Ref Range   Glucose-Capillary 115 (H) 65 - 99 mg/dL  Glucose, capillary     Status: Abnormal   Collection Time: 12/04/17  8:19 AM  Result Value Ref Range   Glucose-Capillary 113 (H) 65 - 99 mg/dL   Comment 1 Notify RN    Comment 2 Document in Chart   Glucose, capillary     Status: Abnormal   Collection Time: 12/04/17 12:06 PM  Result Value Ref Range   Glucose-Capillary 111 (H) 65 - 99 mg/dL   Comment 1 Notify RN    Comment 2 Document in Chart    Ct Head Wo  Contrast  Result Date: 12/02/2017 CLINICAL DATA:  Stroke follow-up.  24 hours post tPA administration. EXAM: CT HEAD WITHOUT CONTRAST TECHNIQUE: Contiguous axial images were obtained from the base of the skull through the vertex without intravenous contrast. COMPARISON:  Earlier today FINDINGS: Brain: Moderate Cytotoxic edema in inferior left occipital lobe that appears stable from prior. Small remote appearing right inferior and left superior cerebellar infarcts. Question small new right superior cerebellar infarct. Subarachnoid high-density in the posterior left cerebral sulci continues to diminish. No hydrocephalus. Vascular: Distal basilar to left PCA stent. Skull: Nonspecific scalp edema dependently. Sinuses/Orbits: Progressive nasal cavity opacification. The patient is intubated with nasopharyngeal and sinus fluid levels. Bilateral sinusitis. IMPRESSION: 1. Continued decrease in subarachnoid high-density along the posterior left cerebral hemisphere. No hydrocephalus. 2. Moderate acute left occipital infarct without detected progression. 3. Question small right superior cerebellar  infarct. Electronically Signed   By: Monte Fantasia M.D.   On: 12/02/2017 16:20   Mr Jodene Nam Head Wo Contrast  Result Date: 12/03/2017 CLINICAL DATA:  Stroke.  Status post tPA administration. EXAM: MRI HEAD WITHOUT CONTRAST MRA HEAD WITHOUT CONTRAST TECHNIQUE: Multiplanar, multiecho pulse sequences of the brain and surrounding structures were obtained without intravenous contrast. Angiographic images of the head were obtained using MRA technique without contrast. COMPARISON:  None. Head CT 12/02/2017 FINDINGS: MRI HEAD FINDINGS Brain: The midline structures are normal. There are multiple areas abnormal diffusion restriction. There are many scattered lesions in the cerebellum. The largest area of ischemia is in the left PCA territory. There also foci of ischemia in the left thalamus. There is multifocal cytotoxic edema corresponding to the areas of ischemia. No mass lesion, hydrocephalus, dural abnormality or extra-axial collection. There is also an old right cerebellar infarct. No age-advanced or lobar predominant atrophy. Minimal susceptibility abnormality of the left occipital lobe. There is blooming at the site of the left PCA stent. Skull and upper cervical spine: The visualized skull base, calvarium, upper cervical spine and extracranial soft tissues are normal. Sinuses/Orbits: No fluid levels or advanced mucosal thickening. No mastoid or middle ear effusion. Normal orbits. MRA HEAD FINDINGS Intracranial internal carotid arteries: Normal. Anterior cerebral arteries: Normal. Middle cerebral arteries: Normal. Posterior communicating arteries: Present bilaterally. Posterior cerebral arteries: There is a stent within the P1 segment of the left posterior cerebral artery that extends into the mid P 2 segment. There is no flow related enhancement seen beyond the extent of the stent. Basilar artery: Normal. Vertebral arteries: Left dominant. Normal. Superior cerebellar arteries: Normal. Anterior inferior cerebellar  arteries: Normal. Posterior inferior cerebellar arteries: Normal. IMPRESSION: 1. Multifocal acute ischemia within the posterior circulation territories, predominantly within the left PCA distribution, including the left occipital lobe and left thalamus. Numerous smaller foci of acute ischemia throughout both cerebellar hemispheres. 2. Minimal susceptibility blooming in the left occipital lobe, likely indicating the presence of a minimal amount of petechial blood. 3. Stent within the P1 segment of the left PCA without flow related enhancement distal to the stent. The stent may be occluded. This could be further assessed with CTA of the head, if clinically warranted. Electronically Signed   By: Ulyses Jarred M.D.   On: 12/03/2017 03:10   Mr Brain Wo Contrast  Result Date: 12/03/2017 CLINICAL DATA:  Stroke.  Status post tPA administration. EXAM: MRI HEAD WITHOUT CONTRAST MRA HEAD WITHOUT CONTRAST TECHNIQUE: Multiplanar, multiecho pulse sequences of the brain and surrounding structures were obtained without intravenous contrast. Angiographic images of the head were obtained using MRA technique without  contrast. COMPARISON:  None. Head CT 12/02/2017 FINDINGS: MRI HEAD FINDINGS Brain: The midline structures are normal. There are multiple areas abnormal diffusion restriction. There are many scattered lesions in the cerebellum. The largest area of ischemia is in the left PCA territory. There also foci of ischemia in the left thalamus. There is multifocal cytotoxic edema corresponding to the areas of ischemia. No mass lesion, hydrocephalus, dural abnormality or extra-axial collection. There is also an old right cerebellar infarct. No age-advanced or lobar predominant atrophy. Minimal susceptibility abnormality of the left occipital lobe. There is blooming at the site of the left PCA stent. Skull and upper cervical spine: The visualized skull base, calvarium, upper cervical spine and extracranial soft tissues are normal.  Sinuses/Orbits: No fluid levels or advanced mucosal thickening. No mastoid or middle ear effusion. Normal orbits. MRA HEAD FINDINGS Intracranial internal carotid arteries: Normal. Anterior cerebral arteries: Normal. Middle cerebral arteries: Normal. Posterior communicating arteries: Present bilaterally. Posterior cerebral arteries: There is a stent within the P1 segment of the left posterior cerebral artery that extends into the mid P 2 segment. There is no flow related enhancement seen beyond the extent of the stent. Basilar artery: Normal. Vertebral arteries: Left dominant. Normal. Superior cerebellar arteries: Normal. Anterior inferior cerebellar arteries: Normal. Posterior inferior cerebellar arteries: Normal. IMPRESSION: 1. Multifocal acute ischemia within the posterior circulation territories, predominantly within the left PCA distribution, including the left occipital lobe and left thalamus. Numerous smaller foci of acute ischemia throughout both cerebellar hemispheres. 2. Minimal susceptibility blooming in the left occipital lobe, likely indicating the presence of a minimal amount of petechial blood. 3. Stent within the P1 segment of the left PCA without flow related enhancement distal to the stent. The stent may be occluded. This could be further assessed with CTA of the head, if clinically warranted. Electronically Signed   By: Ulyses Jarred M.D.   On: 12/03/2017 03:10   Dg Chest Portable 1 View  Result Date: 12/03/2017 CLINICAL DATA:  Respiratory failure, intubation, history hypertension, asthma, sleep apnea, heart murmur EXAM: PORTABLE CHEST 1 VIEW COMPARISON:  Portable exam 0920 hours compared to 12/01/2017 FINDINGS: Tip of endotracheal tube projects 5.1 cm above carina. Nasogastric tube extends into stomach. Enlargement of cardiac silhouette with vascular congestion. Prominent mediastinum unchanged. Asymmetric LEFT LEFT greater than RIGHT perihilar infiltrates question asymmetric edema versus  infection. No gross pleural effusion or pneumothorax. IMPRESSION: Satisfactory endotracheal tube position. Enlargement of cardiac silhouette with pulmonary vascular congestion and asymmetric pulmonary infiltrates LEFT greater than RIGHT question asymmetric pulmonary edema versus infection. Electronically Signed   By: Lavonia Dana M.D.   On: 12/03/2017 09:38    Assessment/Plan: Diagnosis: left PCA infarct 1. Does the need for close, 24 hr/day medical supervision in concert with the patient's rehab needs make it unreasonable for this patient to be served in a less intensive setting? Yes 2. Co-Morbidities requiring supervision/potential complications: asthma, hypertension, post-stroke sequelae 3. Due to bladder management, bowel management, safety, skin/wound care, disease management, medication administration, pain management and patient education, does the patient require 24 hr/day rehab nursing? Yes 4. Does the patient require coordinated care of a physician, rehab nurse, PT (1-2 hrs/day, 5 days/week), OT (1-2 hrs/day, 5 days/week) and SLP (1-2 hrs/day, 5 days/week) to address physical and functional deficits in the context of the above medical diagnosis(es)? Yes Addressing deficits in the following areas: balance, endurance, locomotion, strength, transferring, bowel/bladder control, bathing, dressing, feeding, grooming, toileting, cognition, speech and psychosocial support 5. Can the patient actively participate in  an intensive therapy program of at least 3 hrs of therapy per day at least 5 days per week? Yes 6. The potential for patient to make measurable gains while on inpatient rehab is excellent 7. Anticipated functional outcomes upon discharge from inpatient rehab are supervision  with PT, supervision with OT, modified independent and supervision with SLP. 8. Estimated rehab length of stay to reach the above functional goals is: 13-20 days 9. Anticipated D/C setting: Home 10. Anticipated post D/C  treatments: HH therapy and Outpatient therapy 11. Overall Rehab/Functional Prognosis: excellent  RECOMMENDATIONS: This patient's condition is appropriate for continued rehabilitative care in the following setting: CIR Patient has agreed to participate in recommended program. N/A Note that insurance prior authorization may be required for reimbursement for recommended care.  Comment:  Rehab Admissions Coordinator to follow up.  Thanks,  Meredith Staggers, MD, Mellody Drown   Lavon Paganini Angiulli, PA-C 12/04/2017

## 2017-12-04 NOTE — Progress Notes (Signed)
Preliminary results by tech - Venous Duplex Lower Ext. Completed. Negative for deep and superficial vein thrombosis. Madyx Delfin, BS, RDMS, RVT  

## 2017-12-04 NOTE — Progress Notes (Signed)
PULMONARY / CRITICAL CARE MEDICINE   Name: William Lowery MRN: [id number] DOB: Apr 24, 1966    ADMISSION DATE:  12/01/2017 CHIEF COMPLAINT:  Right sided numbness  HISTORY OF PRESENT ILLNESS:   52 year old male with PMH of HTN, HLD, OSA/OHS with CPAP at HS, Asthma Presented to ED on 1/26 with numbness to right face, arm, and leg. Reports a few days ago that he had a headache with right eye blurriness which resolved on its own. CT Head with Occlusion to Left P1 segment of the PCA. Given TPA. Taken to IR for revascularization. Stent placed in the left PCA, given aggrastat IA, loaded with Brilinta and ASA prior to stent placement. Remained intubated post IR procedure and PCCM consulted for vent management.     SUBJECTIVE:  Extubated, Awake and alert, following commands, right sided weakness.    VITAL SIGNS: BP (!) 114/91   Pulse 78   Temp 99.3 F (37.4 C) (Axillary)   Resp 16   Ht 5\' 10"  (1.778 m)   Wt (!) 311 lb 8.2 oz (141.3 kg)   SpO2 96%   BMI 44.70 kg/m   HEMODYNAMICS:    VENTILATOR SETTINGS: Vent Mode: CPAP;PSV FiO2 (%):  [40 %] 40 % PEEP:  [5 cmH20] 5 cmH20 Pressure Support:  [5 cmH20] 5 cmH20  INTAKE / OUTPUT: I/O last 3 completed shifts: In: 1324 [I.V.:4834] Out: 4685 [Urine:4685]  PHYSICAL EXAMINATION: General:  Obese male, supine in bed, answering questions Neuro: Awake and alert, following commands, Right sided weakness  HEENT:  Normocephalic, atraumatic, huge beard , MM pink and moist Cardiovascular:  s1s2 rrr, +m, No GR Lungs:  resps even non labored on vent, few scattered rhonchi, diminished abses  Abdomen:  Obese, soft, +bs  Musculoskeletal:  Warm and dry, scant BLE edema Skin: Warm , dry and intact   LABS:  BMET Recent Labs  Lab 12/02/17 0535 12/02/17 1600 12/03/17 0422 12/04/17 0250  NA 141  --  140 143  K 3.3* 3.7 3.5 3.7  CL 111  --  110 111  CO2 21*  --  21* 23  BUN 15  --  8 14  CREATININE 0.90  --  0.88 0.73  GLUCOSE 93  --  84 121*     Electrolytes Recent Labs  Lab 12/02/17 0535 12/03/17 0422 12/03/17 1109 12/04/17 0250  CALCIUM 7.3* 7.6*  --  8.3*  MG 2.0 1.9 1.9  --   PHOS 3.3  --  3.2 2.6    CBC Recent Labs  Lab 12/02/17 0535 12/03/17 0422 12/04/17 0250  WBC 12.5* 12.6* 9.2  HGB 14.2 13.9 13.8  HCT 40.8 40.8 39.1  PLT 268 196 170    Coag's Recent Labs  Lab 12/01/17 1550  APTT 27  INR 0.92    Sepsis Markers No results for input(s): LATICACIDVEN, PROCALCITON, O2SATVEN in the last 168 hours.  ABG Recent Labs  Lab 12/01/17 2251 12/02/17 0440 12/03/17 0350  PHART 7.301* 7.239* 7.312*  PCO2ART 50.3* 53.5* 50.1*  PO2ART 164* 118.0* 94.4    Liver Enzymes Recent Labs  Lab 12/01/17 1550  AST 39  ALT 51  ALKPHOS 68  BILITOT 2.1*  ALBUMIN 3.9    Cardiac Enzymes No results for input(s): TROPONINI, PROBNP in the last 168 hours.  Glucose Recent Labs  Lab 12/03/17 1956 12/03/17 2333 12/04/17 0331 12/04/17 0819  GLUCAP 133* 115* 115* 113*    Imaging No results found.   STUDIES:  CT head 1/26>>> 1. Possible hyperdense left P1  segment. 2. Negative for hemorrhage or visible acute infarct. 3. Small remote right inferior cerebellar infarct. CT head 1/27>>> 1. Continued decrease in subarachnoid high-density along the posterior left cerebral hemisphere. No hydrocephalus. 2. Moderate acute left occipital infarct without detected progression. 3. Question small right superior cerebellar infarct. 2D echo 1/27>>> MRI brain 1/28>>> 1. Multifocal acute ischemia within the posterior circulation territories, predominantly within the left PCA distribution, including the left occipital lobe and left thalamus. Numerous smaller foci of acute ischemia throughout both cerebellar hemispheres. 2. Minimal susceptibility blooming in the left occipital lobe, likely indicating the presence of a minimal amount of petechial blood. 3. Stent within the P1 segment of the left PCA without flow  related enhancement distal to the stent. The stent may be occluded. This could be further assessed with CTA of the head, if clinically warranted.  CULTURES:   ANTIBIOTICS:   SIGNIFICANT EVENTS: 1/26 IR revascularization of L PCA stroke   LINES/TUBES: ETT 1/26>>>1/28 R groin sheath 1/26>>>1/27  DISCUSSION: 52yo male with L PCA stroke s/p systemic TPA and IR stenting with post procedure vent needs. Extubated 1/28. Now on 2 L   ASSESSMENT / PLAN:  PULMONARY Acute respiratory failure - initially intubated for neuro IR procedure but having difficulty weaning mostly r/t mental status  Hx OSA/OHS  Extubated 1/28>> doing well. P:   F/u CXR prn will need qhs pos pressure post extubation Mobilize as able Aggressive pulmonary toilet    CARDIOVASCULAR A:  Hx PAF  HTN  Murmur EF 55-60% P:  cardene gtt is off - Permissive HTN (OK if < 180/105) but gradually normalize in 5-7 days Hold home hyzaar  Lasix prn  RENAL No active issue  P:   Trend BMET and UO Strict I/o  Replete electrolytes as needed  GASTROINTESTINAL No active issue  P:   SLP before feeding PPI   HEMATOLOGIC No active issue  S/p systemic TPA 1/26 P:  Monitor for active bleeding SQ heparin  F/u CBC  Transfuse for HGB < 7  INFECTIOUS No active issue  P:   Monitor wbc, fever curve off abx VAP prevention  CXR prn  ENDOCRINE No active issue  P:   Monitor glucose on chem   NEUROLOGIC L PCA stroke s/p TPA and IR stenting - ?worsening hemiplegia 1/27.  MRI as above from 1/28 with concern for stent occlusion  AMS  P:   RASS goal: 0 to 1  Diona Fanti, brilinta PT/OT consult placed  Per stroke team   PCCM will sign off and transfer care to [facility]. Please re-consult if we can be of any further assistance  FAMILY  - Updates: no family available 1/29  - Inter-disciplinary family meet or Palliative Care meeting due by:  Day 7     Magdalen Spatz, AGACNP-BC 12/04/2017  10:09 AM Pager:  [phone]

## 2017-12-04 NOTE — Evaluation (Signed)
Occupational Therapy Evaluation Patient Details Name: William Lowery MRN: [id number] DOB: [birth year] Today's Date: 12/04/2017    History of Present Illness 52 y.o. male with PMHx: HTN, sleep apnea, right cerebellar CVA. Admitted with L PCA, R SCA, bil PICA infarcts s/p tPA and thrombectomy with occluded stents, intubated 1/26-1/28   Clinical Impression   PTA, pt was living with his wife and children and was independent and working full-time. Pt currently requiring Mod A for UB ADLs, Max A for LB ADLs, and Min A +2 for functional mobility with RW. Pt presenting with decreased functional use of RUE (dominant hand), balance, cognition, and vision. Pt motivated to participate in therapy despite fatigue and want to return to PLOF. Pt will required further acute OT to facilitate safe dc. Recommend dc to CIR for intensive therapy to optimize safety and independence with ADLs and functional mobility.     Follow Up Recommendations  CIR;Supervision/Assistance - 24 hour    Equipment Recommendations  Other (comment)(Defer to next venue)    Recommendations for Other Services Rehab consult;PT consult;Speech consult     Precautions / Restrictions Precautions Precautions: Fall Precaution Comments: Visual deficits (diplopia and possible R field cut) and aphasia Restrictions Weight Bearing Restrictions: No      Mobility Bed Mobility Overal bed mobility: Needs Assistance Bed Mobility: Supine to Sit     Supine to sit: Min assist;HOB elevated     General bed mobility comments: min HHA to fully elevate trunk with increased time and cues to pivot from supine to sit with HOB 25degrees  Transfers Overall transfer level: Needs assistance   Transfers: Sit to/from Stand Sit to Stand: Min assist;+2 safety/equipment         General transfer comment: min assist to stand from bed and chair with cues for hand placement, safety, increased time to achieve full knee extension and upright posture     Balance Overall balance assessment: Needs assistance   Sitting balance-Leahy Scale: Poor Sitting balance - Comments: pt able to sit EOB without assist with tendency for right posterior lean with verbal and tactile cues to correct Postural control: Posterior lean;Right lateral lean   Standing balance-Leahy Scale: Poor Standing balance comment: reliant on bil UE support for standing                           ADL either performed or assessed with clinical judgement   ADL Overall ADL's : Needs assistance/impaired Eating/Feeding: Moderate assistance;Sitting   Grooming: Wash/dry face;Moderate assistance;Sitting Grooming Details (indicate cue type and reason): Pt washing his face while seated at EOB. Pt with R lateral lean during activity and required tactile cues and Min physical A to correct posture. Pt initating task with L hand and switched to R hand with cues. Pt requiring increased time to bring R hand to face and demosntrating increased effort. Upper Body Bathing: Moderate assistance;Sitting   Lower Body Bathing: Maximal assistance;+2 for safety/equipment;Sit to/from stand   Upper Body Dressing : Moderate assistance;Sitting   Lower Body Dressing: Maximal assistance;+2 for safety/equipment;Sit to/from stand Lower Body Dressing Details (indicate cue type and reason): Max A to don socks. Initating R sock over toes adn then pt bending forward to pull over his heel; demosntrating increased effort and SpO2 dropping.  Toilet Transfer: Minimal assistance;+2 for physical assistance;Stand-pivot;RW;Cueing for safety;Cueing for sequencing Toilet Transfer Details (indicate cue type and reason): Min A +2 for stand pivot to recliner. Pt requiring significant amount if time and cues  Functional mobility during ADLs: Minimal assistance;+2 for physical assistance;Cueing for sequencing;Cueing for safety;Rolling walker General ADL Comments: Pt demonstrating decreased fucntional  performance and is impacted by decreased funcitonal use of RUE (dominant), balance, cognition, vision, and activity tolerance. Pt motivated to particiapte in therapy     Vision   Vision Assessment?: Yes;Vision impaired- to be further tested in functional context Eye Alignment: Within Functional Limits Alignment/Gaze Preference: Within Defined Limits Diplopia Assessment: Disappears with one eye closed;Objects split on top of one another Additional Comments: L eye dominant (unsure of accurancy due to cognition). Pt reporting vertical diplopia and blurry vision. Suspect possible right visual field cut. Will continue to assess.     Perception     Praxis      Pertinent Vitals/Pain Pain Assessment: No/denies pain     Hand Dominance Right   Extremity/Trunk Assessment Upper Extremity Assessment Upper Extremity Assessment: RUE deficits/detail RUE Deficits / Details: Pt with poor grasp and FM skills. With significant about of time, pt able to bring RUE to face. Poor cooridination and uses compensatory shoulder hiking RUE Coordination: decreased fine motor;decreased gross motor   Lower Extremity Assessment Lower Extremity Assessment: Defer to PT evaluation   Cervical / Trunk Assessment Cervical / Trunk Assessment: (forward head, rounded shoulders)   Communication Communication Communication: Expressive difficulties   Cognition Arousal/Alertness: Awake/alert Behavior During Therapy: Flat affect Overall Cognitive Status: Difficult to assess Area of Impairment: Orientation;Memory;Following commands;Safety/judgement;Problem solving;Attention;Awareness                 Orientation Level: Disoriented to;Time;Situation Current Attention Level: Sustained Memory: Decreased short-term memory Following Commands: Follows one step commands with increased time;Follows one step commands inconsistently Safety/Judgement: Decreased awareness of deficits;Decreased awareness of safety Awareness:  Emergent Problem Solving: Slow processing;Requires verbal cues;Requires tactile cues General Comments: Pt with decreased attention and fatigued quickly. Required increased time for processing and needing cues to attend to questions and tasks.    General Comments  Wife arriving at end of sessio nand confirming PLOF and home set up    Exercises     Shoulder Instructions      Home Living Family/patient expects to be discharged to:: Private residence Living Arrangements: Spouse/significant other Available Help at Discharge: Family;Available 24 hours/day Type of Home: House Home Access: Stairs to enter [employer] of Steps: 2   Home Layout: One level     Bathroom Shower/Tub: Tub/shower unit;Walk-in Psychologist, prison and probation services: Standard     Home Equipment: None      Lives With: Spouse    Prior Functioning/Environment Level of Independence: Independent        Comments: ADLs, IADLs, driving, and works full time and owns [employer]        OT Problem List: Decreased strength;Decreased range of motion;Decreased activity tolerance;Impaired balance (sitting and/or standing);Impaired vision/perception;Decreased coordination;Decreased cognition;Decreased safety awareness;Decreased knowledge of use of DME or AE;Impaired UE functional use      OT Treatment/Interventions: Self-care/ADL training;Therapeutic exercise;Energy conservation;DME and/or AE instruction;Therapeutic activities;Patient/family education    OT Goals(Current goals can be found in the care plan section) Acute Rehab OT Goals Patient Stated Goal: return to work OT Goal Formulation: With patient/family Time For Goal Achievement: 12/18/17 Potential to Achieve Goals: Good ADL Goals Pt Will Perform Grooming: (P) with min guard assist;sitting Pt Will Perform Upper Body Dressing: (P) sitting;with min guard assist Pt Will Perform Lower Body Dressing: (P) with min assist;sit to/from stand Pt Will  Transfer to Toilet: (P) with min assist;ambulating;regular height toilet Additional  ADL Goal #1: (P) Pt will attend to ADL task with 2-3 verbal cues.  OT Frequency: Min 3X/week   Barriers to D/C:            Co-evaluation PT/OT/SLP Co-Evaluation/Treatment: Yes Reason for Co-Treatment: Complexity of the patient's impairments (multi-system involvement) PT goals addressed during session: Mobility/safety with mobility;Balance;Proper use of DME OT goals addressed during session: ADL's and self-care      AM-PAC PT "6 Clicks" Daily Activity     Outcome Measure Help from another person eating meals?: A Lot Help from another person taking care of personal grooming?: A Lot Help from another person toileting, which includes using toliet, bedpan, or urinal?: A Lot Help from another person bathing (including washing, rinsing, drying)?: A Lot Help from another person to put on and taking off regular upper body clothing?: A Lot Help from another person to put on and taking off regular lower body clothing?: A Lot 6 Click Score: 12   End of Session Equipment Utilized During Treatment: Gait belt;Rolling walker Nurse Communication: Mobility status  Activity Tolerance: Patient tolerated treatment well Patient left: in chair;with call bell/phone within reach;with chair alarm set;with family/visitor present  OT Visit Diagnosis: Unsteadiness on feet (R26.81);Other abnormalities of gait and mobility (R26.89);Muscle weakness (generalized) (M62.81);Other symptoms and signs involving cognitive function;Low vision, both eyes (H54.2);Hemiplegia and hemiparesis Hemiplegia - Right/Left: Right Hemiplegia - dominant/non-dominant: Dominant Hemiplegia - caused by: Cerebral infarction                Time: 8811-0315 OT Time Calculation (min): 33 min Charges:  OT General Charges $OT Visit: 1 Visit OT Evaluation $OT Eval Moderate Complexity: 1 Mod G-Codes:     [place] MSOT, OTR/L Acute Rehab Pager:  [phone] Office: [place] 12/04/2017, 1:28 PM

## 2017-12-05 ENCOUNTER — Inpatient Hospital Stay (HOSPITAL_COMMUNITY): Payer: Self-pay

## 2017-12-05 DIAGNOSIS — I63539 Cerebral infarction due to unspecified occlusion or stenosis of unspecified posterior cerebral artery: Secondary | ICD-10-CM

## 2017-12-05 LAB — CARDIOLIPIN ANTIBODIES, IGG, IGM, IGA: Anticardiolipin IgM: 11 MPL U/mL (ref 0–12)

## 2017-12-05 LAB — BASIC METABOLIC PANEL
Anion gap: 10 (ref 5–15)
BUN: 11 mg/dL (ref 6–20)
CALCIUM: 8.2 mg/dL — AB (ref 8.9–10.3)
CO2: 24 mmol/L (ref 22–32)
CREATININE: 0.74 mg/dL (ref 0.61–1.24)
Chloride: 108 mmol/L (ref 101–111)
GLUCOSE: 112 mg/dL — AB (ref 65–99)
Potassium: 3.1 mmol/L — ABNORMAL LOW (ref 3.5–5.1)
Sodium: 142 mmol/L (ref 135–145)

## 2017-12-05 LAB — GLUCOSE, CAPILLARY
GLUCOSE-CAPILLARY: 94 mg/dL (ref 65–99)
GLUCOSE-CAPILLARY: 93 mg/dL (ref 65–99)
Glucose-Capillary: 94 mg/dL (ref 65–99)
Glucose-Capillary: 112 mg/dL — ABNORMAL HIGH (ref 65–99)

## 2017-12-05 LAB — LUPUS ANTICOAGULANT PANEL
DRVVT: 34.9 s (ref 0.0–47.0)
PTT LA: 36.4 s (ref 0.0–51.9)

## 2017-12-05 LAB — T4, FREE: FREE T4: 0.8 ng/dL (ref 0.61–1.12)

## 2017-12-05 LAB — HOMOCYSTEINE: HOMOCYSTEINE-NORM: 3.9 umol/L (ref 0.0–15.0)

## 2017-12-05 LAB — ANTINUCLEAR ANTIBODIES, IFA: ANA Ab, IFA: NEGATIVE

## 2017-12-05 LAB — CBC
HCT: 39.7 % (ref 39.0–52.0)
HEMOGLOBIN: 13.7 g/dL (ref 13.0–17.0)
MCH: 30.7 pg (ref 26.0–34.0)
MCHC: 34.5 g/dL (ref 30.0–36.0)
MCV: 89 fL (ref 78.0–100.0)
PLATELETS: 212 10*3/uL (ref 150–400)
RBC: 4.46 MIL/uL (ref 4.22–5.81)
RDW: 12.9 % (ref 11.5–15.5)
WBC: 14.2 10*3/uL — ABNORMAL HIGH (ref 4.0–10.5)

## 2017-12-05 LAB — RPR: RPR: NONREACTIVE

## 2017-12-05 LAB — ANTI-DNA ANTIBODY, DOUBLE-STRANDED: ds DNA Ab: <1 IU/mL (ref 0–9)

## 2017-12-05 MED ORDER — MENTHOL 3 MG MT LOZG: 1.0000 | LOZENGE | OROMUCOSAL | Status: DC | PRN

## 2017-12-05 MED ORDER — ATORVASTATIN CALCIUM 10 MG PO TABS
10.0000 mg | ORAL_TABLET | Freq: Every day | ORAL | Status: DC
  Administered 2017-12-06 – 2017-12-09 (×4): 10 mg via ORAL
  Filled 2017-12-05 (×5): qty 1

## 2017-12-05 MED ORDER — POTASSIUM CHLORIDE CRYS ER 20 MEQ PO TBCR
40.0000 meq | EXTENDED_RELEASE_TABLET | Freq: Two times a day (BID) | ORAL | Status: DC
  Administered 2017-12-05 – 2017-12-06 (×3): 40 meq via ORAL
  Filled 2017-12-05 (×3): qty 2

## 2017-12-05 MED ORDER — LOSARTAN POTASSIUM 50 MG PO TABS
50.0000 mg | ORAL_TABLET | Freq: Every day | ORAL | Status: DC
  Administered 2017-12-05 – 2017-12-10 (×5): 50 mg via ORAL
  Filled 2017-12-05 (×6): qty 1

## 2017-12-05 NOTE — Plan of Care (Signed)
  Education: Knowledge of disease or condition will improve 12/05/2017 0204 - Progressing by Bronson Curb, RN Knowledge of secondary prevention will improve 12/05/2017 0204 - Progressing by Bronson Curb, RN Knowledge of patient specific risk factors addressed and post discharge goals established will improve 12/05/2017 0204 - Progressing by Yamilett Anastos, Lanetta Inch, RN

## 2017-12-05 NOTE — Progress Notes (Signed)
Physical Therapy Treatment Patient Details Name: William Lowery MRN: [id number] DOB: 09-29-1966 Today's Date: 12/05/2017    History of Present Illness 52 y.o. male with PMHx: HTN, sleep apnea, right cerebellar CVA. Admitted with L PCA, R SCA, bil PICA infarcts s/p tPA and thrombectomy with occluded stents, intubated 1/26-1/28    PT Comments    Pt is motivated to participate today however is limited in his ability by extreme lethargy. Pt currently minA for supine to sit, minA for sit to stand and modAx2 for lateral stepping towards head of bed. D/c plans remain appropriate given pt's desire to work. PT will continue to follow acutely until d/c.    Follow Up Recommendations  CIR;Supervision/Assistance - 24 hour     Equipment Recommendations  Rolling walker with 5" wheels    Recommendations for Other Services Rehab consult     Precautions / Restrictions Precautions Precautions: Fall Precaution Comments: Visual deficits (diplopia and possible R field cut) and aphasia Restrictions Weight Bearing Restrictions: No    Mobility  Bed Mobility Overal bed mobility: Needs Assistance Bed Mobility: Supine to Sit;Sit to Supine     Supine to sit: Min assist;HOB elevated Sit to supine: Max assist;+2 for safety/equipment   General bed mobility comments: min A for management of LE off bed, vc for use of bedrail to pull to EoB and for pushing to upright, maxAx2 for trunk management and LE managment into bed   Transfers Overall transfer level: Needs assistance   Transfers: Sit to/from Stand Sit to Stand: Min assist;+2 safety/equipment         General transfer comment: min assist to stand from bed and chair with cues for hand placement, safety, increased time to achieve full knee extension and upright posture  Ambulation/Gait Ambulation/Gait assistance: Mod assist;+2 physical assistance Ambulation Distance (Feet): 3 Feet Assistive device: Rolling walker (2 wheeled) Gait  Pattern/deviations: Shuffle;Step-to pattern;Wide base of support Gait velocity: slowed Gait velocity interpretation: Below normal speed for age/gender General Gait Details: side stepping to R along side of bed due to increased lethargy, modA for balance, vc for weight shift to offweight R LE for movement       Balance Overall balance assessment: Needs assistance   Sitting balance-Leahy Scale: Poor Sitting balance - Comments: pt able to sit EOB without assist with tendency for right posterior lean with verbal and tactile cues to correct Postural control: Posterior lean;Right lateral lean   Standing balance-Leahy Scale: Poor Standing balance comment: reliant on bil UE support for standing                            Cognition Arousal/Alertness: Lethargic Behavior During Therapy: Flat affect Overall Cognitive Status: Difficult to assess Area of Impairment: Orientation;Memory;Following commands;Safety/judgement;Problem solving;Attention;Awareness                 Orientation Level: Disoriented to;Time;Situation Current Attention Level: Sustained Memory: Decreased short-term memory Following Commands: Follows one step commands with increased time;Follows one step commands inconsistently Safety/Judgement: Decreased awareness of deficits;Decreased awareness of safety Awareness: Emergent Problem Solving: Slow processing;Requires verbal cues;Requires tactile cues General Comments: Pt with decreased attention and fatigued quickly. Required increased time for processing and needing cues to attend to questions and tasks.          General Comments General comments (skin integrity, edema, etc.): wife present during session      Pertinent Vitals/Pain Pain Assessment: No/denies pain  PT Goals (current goals can now be found in the care plan section) Acute Rehab PT Goals Patient Stated Goal: return to work PT Goal  Formulation: With patient/family Time For Goal Achievement: 12/18/17 Potential to Achieve Goals: Fair Progress towards PT goals: Progressing toward goals    Frequency    Min 4X/week      PT Plan Current plan remains appropriate       AM-PAC PT "6 Clicks" Daily Activity  Outcome Measure  Difficulty turning over in bed (including adjusting bedclothes, sheets and blankets)?: A Lot Difficulty moving from lying on back to sitting on the side of the bed? : Unable Difficulty sitting down on and standing up from a chair with arms (e.g., wheelchair, bedside commode, etc,.)?: Unable Help needed moving to and from a bed to chair (including a wheelchair)?: A Lot Help needed walking in hospital room?: A Lot Help needed climbing 3-5 steps with a railing? : Total 6 Click Score: 9    End of Session Equipment Utilized During Treatment: Gait belt Activity Tolerance: Patient limited by lethargy Patient left: in bed;with call bell/phone within reach;with family/visitor present;with bed alarm set Nurse Communication: Mobility status PT Visit Diagnosis: Other abnormalities of gait and mobility (R26.89);Unsteadiness on feet (R26.81);Other symptoms and signs involving the nervous system (A30.940)     Time: 7680-8811 PT Time Calculation (min) (ACUTE ONLY): 20 min  Charges:  $Therapeutic Activity: 8-22 mins                    G Codes:       Zyia Kaneko B. Migdalia Dk PT, DPT Acute Rehabilitation  [phone] Pager [phone]    [place] 12/05/2017, 6:08 PM

## 2017-12-05 NOTE — Progress Notes (Signed)
    [facility] has been requested to perform a transesophageal echocardiogram on 12/05/17 for Stroke.  After careful review of history and examination, the risks and benefits of transesophageal echocardiogram have been explained including risks of esophageal damage, perforation (1:10,000 risk), bleeding, pharyngeal hematoma as well as other potential complications associated with conscious sedation including aspiration, arrhythmia, respiratory failure and death. Alternatives to treatment were discussed, questions were answered. Discussed with patient/wife [name]) at the bedside and she is willing to proceed.   Reino Bellis, NP-C 12/05/2017 6:15 PM

## 2017-12-05 NOTE — Progress Notes (Signed)
I contacted pt's wife by phone to notify her that the insurance cards I received from her yesterday are not Medical. She will call me when she arrives and clarify his Medical Insurance. [phone]

## 2017-12-05 NOTE — Progress Notes (Signed)
STROKE TEAM PROGRESS NOTE   SUBJECTIVE (INTERVAL HISTORY) No family at bedside. Sleepy but easily arouses, answers questions and follows all commands. TEE and loop.  Scheduled for AM and then d/c to CIR. No acute events reported overnight.  OBJECTIVE Temp:  [98.3 F (36.8 C)-99.6 F (37.6 C)] 98.7 F (37.1 C) (01/30 0959) Pulse Rate:  [74-93] 90 (01/30 0959) Cardiac Rhythm: Normal sinus rhythm (01/30 0807) Resp:  [16-18] 18 (01/30 0959) BP: (131-149)/(89-94) 131/94 (01/30 0959) SpO2:  [96 %-100 %] 97 % (01/30 0959)  CBC:  Recent Labs  Lab 12/01/17 1550  12/03/17 0422 12/04/17 0250 12/05/17 0308  WBC 10.5   < > 12.6* 9.2 14.2*  NEUTROABS 7.0  --  10.3*  --   --   HGB 17.1*   < > 13.9 13.8 13.7  HCT 48.8   < > 40.8 39.1 39.7  MCV 88.7   < > 91.7 89.3 89.0  PLT 243   < > 196 170 212   < > = values in this interval not displayed.    Basic Metabolic Panel:  Recent Labs  Lab 12/03/17 0422 12/03/17 1109 12/04/17 0250 12/05/17 0308  NA 140  --  143 142  K 3.5  --  3.7 3.1*  CL 110  --  111 108  CO2 21*  --  23 24  GLUCOSE 84  --  121* 112*  BUN 8  --  14 11  CREATININE 0.88  --  0.73 0.74  CALCIUM 7.6*  --  8.3* 8.2*  MG 1.9 1.9  --   --   PHOS  --  3.2 2.6  --     Lipid Panel:     Component Value Date/Time   CHOL 144 12/02/2017 0535   TRIG 246 (H) 12/02/2017 0535   TRIG 244 (H) 12/02/2017 0535   HDL 37 (L) 12/02/2017 0535   CHOLHDL 3.9 12/02/2017 0535   VLDL 49 (H) 12/02/2017 0535   LDLCALC 58 12/02/2017 0535   HgbA1c:  Lab Results  Component Value Date   HGBA1C 5.7 (H) 12/02/2017   Urine Drug Screen: No results found for: LABOPIA, COCAINSCRNUR, LABBENZ, AMPHETMU, THCU, LABBARB  Alcohol Level No results found for: [place] I have personally reviewed the radiological images below and agree with the radiology interpretations.  Ct Angio Head W Or Wo Contrast Ct Angio Neck W Or Wo Contrast  Ct Cerebral Perfusion W Contrast 12/01/2017 IMPRESSION:   1. 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 14 cc of left PCA distribution penumbra with no infarct by CTP.  2. 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 No embolic source identified in the head or neck. No atheromatous changes or chronic stenoses.  3. 2.5 cm right thyroid nodule, recommend sonographic follow-up.   Ct Head Wo Contrast 12/01/2017 IMPRESSION:  1. Subarachnoid hyperdensity within the posterior left temporal lobe and left occipital lobe, which could be contrast staining or petechial/subarachnoid hemorrhage.  2. No herniation, hydrocephalus or intraparenchymal hematoma.   Ct Head Code Stroke Wo Contrast 12/01/2017 IMPRESSION:  1. Possible hyperdense left P1 segment.  2. Negative for hemorrhage or visible acute infarct.  3. Small remote right inferior cerebellar infarct.   Mri and Mra Head Wo Contrast 12/03/2017 IMPRESSION: 1. Multifocal acute ischemia within the posterior circulation territories, predominantly within the left PCA distribution, including the left occipital lobe and left thalamus. Numerous smaller foci of acute ischemia throughout both cerebellar hemispheres. 2. Minimal susceptibility blooming in the left  occipital lobe, likely indicating the presence of a minimal amount of petechial blood. 3. Stent within the P1 segment of the left PCA without flow related enhancement distal to the stent. The stent may be occluded. This could be further assessed with CTA of the head, if clinically warranted.   CT HEAD  1. Continued decrease in subarachnoid high-density along the posterior left cerebral hemisphere. No hydrocephalus. 2. Moderate acute left occipital infarct without detected progression. 3. Question small right superior cerebellar infarct.  Transthoracic Echocardiogram - Left ventricle: The cavity size was normal. Wall thickness was   increased in a pattern of mild LVH. Systolic function was normal.   The estimated ejection fraction was in the  range of 55% to 60%.   Wall motion was normal; there were no regional wall motion   abnormalities. Left ventricular diastolic function parameters   were normal. - Aortic valve: Moderately calcified annulus. Trileaflet;   moderately thickened leaflets. There was moderate stenosis. There   was mild regurgitation. Mean gradient (S): 22 mm Hg. Valve area   (VTI): 1.36 cm^2. Valve area (Vmax): 1.17 cm^2. Valve area   (Vmean): 1.25 cm^2. - Mitral valve: Mildly calcified annulus. Mildly thickened leaflets  Cerebral angiogram - Dr. Estanislado Pandy 12/01/2017 S/P bilateral common carotid and vertebral artery angiograms,followed  By partial revascularization of Lt PCA with x 2 passes with solitaire 53mm x 40 mm retriever device ,x 1 pass with the embotrap retriever device  And placement of intracranial stent in the Lt PCA for recurrent occlusion after retriever passes achieving a TICI 2b re pertfusion,followe by .75 mg og aggrastat IA into the basilar artery with slow revascularization of intrastent occlusion.due to acute platelet aggregation. Also loaded with brilinta 180 mg and aspirin 81 mg via orogastric tube prior to stent placement  PORTABLE CHEST 1 VIEW  12/05/2017 09:45 COMPARISON:  12/03/2017 and earlier. IMPRESSION: 1. Extubated and enteric tube removed. 2.  No acute cardiopulmonary abnormality.  Lower Venous Study 12/04/2017 at 1:21:55 PM. Final Interpretation: Right: There is no evidence of deep vein thrombosis in the lower extremity.There is no evidence of superficial venous thrombosis. No cystic structure found in the popliteal fossa. Left: There is no evidence of deep vein thrombosis in the lower extremity.There is no evidence of superficial venous thrombosis. No cystic structure found in the popliteal fossa.  PHYSICAL EXAM Vitals:   12/04/17 2126 12/05/17 0154 12/05/17 0700 12/05/17 0959  BP: (!) 145/92 (!) 149/89 (!) 142/92 (!) 131/94  Pulse: 90 91 93 90  Resp: 18 18 18 18   Temp:  98.7 F (37.1 C) 98.4 F (36.9 C) 99.6 F (37.6 C) 98.7 F (37.1 C)  TempSrc: Oral Oral Oral Oral  SpO2: 98% 96% 100% 97%  Weight:      Height:        Temp:  [98.3 F (36.8 C)-99.6 F (37.6 C)] 98.7 F (37.1 C) (01/30 0959) Pulse Rate:  [74-93] 90 (01/30 0959) Resp:  [16-18] 18 (01/30 0959) BP: (131-149)/(89-94) 131/94 (01/30 0959) SpO2:  [96 %-100 %] 97 % (01/30 0959)  General - Well nourished, well developed, mildly lethargic.  Ophthalmologic - Fundi not visualized due to small pupils.  Cardiovascular - Regular rate and rhythm.  Neuro - awake alert but mildly lethargic. Eyes open and following simple commands. PERRL, seems to have right upper quadrantanopia but not quite cooperative on exam. Eye moving both directions, no gaze preference. No facial droop or tongue deviation. LUE and LLE 5/5. RUE 4/5 proximal with pronator drift but  5/5 distal. RLE 5/5. DTR 1+ and no babinski. Sensation decreased on the right, coordination intact and gait not tested.    ASSESSMENT/PLAN [name] is a 52 y.o. male with history of asthma, hypertension, and obstructive sleep apnea (wears C Pap) presenting with right-sided numbness.  The patient received TPA on Saturday, 12/01/2017 at [facility]. -> Interventional radiology.  Stroke: Left PCA infarct with P1 occlusion s/p tPA and IR with TICI 2b reperfusion and left P1 stent - possibly embolic - source unknown. Additional right SCA and b/l PICA infarcts likely due to procedure  Resultant right hemiparesthesia, right upper quadrantanopia  CT head repeat decreased subarachnoid hyperdensity, left PCA infact  MRI head - left PCA, right SCA and b/l PICA infarcts  MRA head - left PCA occlusion s/p stent, right SCA occlusion  2D Echo - EF 55-60%  LE venous doppler no DVT  Hypercoagulable work up pending  TEE and loop scheduled for 12/06/2017  LDL - 58  HgbA1c - 5.7  VTE prophylaxis - heparin subq Fall precautions Diet Heart Room  service appropriate? Yes; Fluid consistency: Thin Diet NPO time specified  No antithrombotic prior to admission, now on aspirin 81 mg daily and Brilinta.   Patient counseled to be compliant with his antithrombotic medications  Ongoing aggressive stroke risk factor management  Therapy recommendations:  CIR  Disposition:  CIR in AM  Hypertension  Stable, started Losartan 50 mg 12/05/2017  Permissive hypertension (OK if < 180/105) but gradually normalize in 5-7 days  Long-term BP goal normotensive  Other Stroke Risk Factors  Obesity, Body mass index is 44.7 kg/m., recommend weight loss, diet and exercise as appropriate   Hx stroke/TIA - remote right PICA infarct by imaging  OSA, on CPAP at home  Other Active Problems  Hypokalemia - replacement in progress  Mild leukocytosis - remains afebrile, CXR negative, likely reactive       Will continue to monitor closely, repeat labs in AM  Hospital day # [place], [place] Stroke Neurology Team 12/05/2017 12:58 PM  ATTENDING NOTE: I reviewed above note and agree with the assessment and plan. I have made any additions or clarifications directly to the above note. Pt was seen and examined.   Pt neuro stable, lethargic but visual field full on today's exam, still has right UE weakness 4/5 proximal and distal, RLE 5/5. Pending TEE and loop as well as CIR admission tomorrow. Continue ASA and brilinta. Add lipitor 10mg  for stent placement. Continue close monitoring.  Rosalin Hawking, MD PhD Stroke Neurology 12/05/2017 6:13 PM     To contact Stroke Continuity provider, please refer to [email or web address] After hours, contact General Neurology

## 2017-12-05 NOTE — Care Management Note (Signed)
Case Management Note  Patient Details  Name: William Lowery MRN: [id number] Date of Birth: 1966/08/08  Subjective/Objective:    Pt admitted with CVA. He is from home with his spouse.                Action/Plan: Pt to have TEE tomorrow. Recommendations are for CIR. CM following for d/c disposition.  Expected Discharge Date:                  Expected Discharge Plan:  [place]  In-House Referral:     Discharge planning Services  CM Consult  Post Acute Care Choice:    Choice offered to:     DME Arranged:    DME Agency:     HH Arranged:    [place] Agency:     Status of Service:  In process, will continue to follow  If discussed at Long Length of Stay Meetings, dates discussed:    Additional Comments:  Pollie Friar, RN 12/05/2017, 12:52 PM

## 2017-12-06 ENCOUNTER — Ambulatory Visit (HOSPITAL_COMMUNITY): Admit: 2017-12-06 | Payer: PRIVATE HEALTH INSURANCE | Admitting: Internal Medicine

## 2017-12-06 ENCOUNTER — Inpatient Hospital Stay (HOSPITAL_COMMUNITY): Payer: Self-pay

## 2017-12-06 ENCOUNTER — Inpatient Hospital Stay (HOSPITAL_COMMUNITY): Payer: Self-pay | Admitting: Certified Registered Nurse Anesthetist

## 2017-12-06 ENCOUNTER — Encounter (HOSPITAL_COMMUNITY)
Admission: EM | Disposition: A | Discharge status: Other Acute Inpatient Hospital | Payer: Self-pay | Source: Home / Self Care | Attending: Neurology

## 2017-12-06 ENCOUNTER — Encounter (HOSPITAL_COMMUNITY): Payer: Self-pay | Admitting: Certified Registered Nurse Anesthetist

## 2017-12-06 DIAGNOSIS — I6389 Other cerebral infarction: Secondary | ICD-10-CM

## 2017-12-06 DIAGNOSIS — I330 Acute and subacute infective endocarditis: Secondary | ICD-10-CM

## 2017-12-06 DIAGNOSIS — E785 Hyperlipidemia, unspecified: Secondary | ICD-10-CM

## 2017-12-06 DIAGNOSIS — I350 Nonrheumatic aortic (valve) stenosis: Secondary | ICD-10-CM

## 2017-12-06 HISTORY — PX: TEE WITHOUT CARDIOVERSION: SHX5443

## 2017-12-06 LAB — CBC
HCT: 42.7 % (ref 39.0–52.0)
Hemoglobin: 14.4 g/dL (ref 13.0–17.0)
MCH: 29.9 pg (ref 26.0–34.0)
MCHC: 33.7 g/dL (ref 30.0–36.0)
MCV: 88.6 fL (ref 78.0–100.0)
PLATELETS: 231 10*3/uL (ref 150–400)
RBC: 4.82 MIL/uL (ref 4.22–5.81)
RDW: 13 % (ref 11.5–15.5)
WBC: 16.5 10*3/uL — AB (ref 4.0–10.5)

## 2017-12-06 LAB — GLUCOSE, CAPILLARY
GLUCOSE-CAPILLARY: 93 mg/dL (ref 65–99)
Glucose-Capillary: 92 mg/dL (ref 65–99)
Glucose-Capillary: 108 mg/dL — ABNORMAL HIGH (ref 65–99)
Glucose-Capillary: 80 mg/dL (ref 65–99)

## 2017-12-06 LAB — BASIC METABOLIC PANEL
ANION GAP: 11 (ref 5–15)
BUN: 13 mg/dL (ref 6–20)
CALCIUM: 8.5 mg/dL — AB (ref 8.9–10.3)
CHLORIDE: 107 mmol/L (ref 101–111)
CO2: 24 mmol/L (ref 22–32)
CREATININE: 0.72 mg/dL (ref 0.61–1.24)
GFR calc Af Amer: >60 mL/min (ref >60)
GLUCOSE: 92 mg/dL (ref 65–99)
POTASSIUM: 3 mmol/L — AB (ref 3.5–5.1)
Sodium: 142 mmol/L (ref 135–145)

## 2017-12-06 LAB — C DIFFICILE QUICK SCREEN W PCR REFLEX
C DIFFICLE (CDIFF) ANTIGEN: POSITIVE — AB
C Diff interpretation: DETECTED
C Diff toxin: POSITIVE — AB

## 2017-12-06 LAB — MAGNESIUM: MAGNESIUM: 2 mg/dL (ref 1.7–2.4)

## 2017-12-06 LAB — PHOSPHORUS: Phosphorus: 3.4 mg/dL (ref 2.5–4.6)

## 2017-12-06 LAB — BETA-2-GLYCOPROTEIN I ABS, IGG/M/A: Beta-2-Glycoprotein I IgM: <9 GPI IgM units (ref 0–32)

## 2017-12-06 SURGERY — LOOP RECORDER INSERTION

## 2017-12-06 SURGERY — ECHOCARDIOGRAM, TRANSESOPHAGEAL
Anesthesia: Monitor Anesthesia Care

## 2017-12-06 MED ORDER — PROPOFOL 500 MG/50ML IV EMUL
INTRAVENOUS | Status: DC | PRN
  Administered 2017-12-06: 100 ug/kg/min via INTRAVENOUS

## 2017-12-06 MED ORDER — IOPAMIDOL (ISOVUE-370) INJECTION 76%
INTRAVENOUS | Status: AC
  Administered 2017-12-06: 100 mL
  Filled 2017-12-06: qty 100

## 2017-12-06 MED ORDER — PROPOFOL 10 MG/ML IV BOLUS
INTRAVENOUS | Status: DC | PRN
  Administered 2017-12-06 (×2): 20 mg via INTRAVENOUS

## 2017-12-06 MED ORDER — POTASSIUM CHLORIDE CRYS ER 20 MEQ PO TBCR
40.0000 meq | EXTENDED_RELEASE_TABLET | ORAL | Status: AC
  Administered 2017-12-06 (×2): 40 meq via ORAL
  Filled 2017-12-06 (×2): qty 2

## 2017-12-06 MED ORDER — POTASSIUM CHLORIDE 10 MEQ/100ML IV SOLN
10.0000 meq | INTRAVENOUS | Status: DC
  Filled 2017-12-06 (×6): qty 100

## 2017-12-06 MED ORDER — SODIUM CHLORIDE 0.9 % IV SOLN
INTRAVENOUS | Status: DC
  Administered 2017-12-06 – 2017-12-10 (×5): via INTRAVENOUS

## 2017-12-06 MED ORDER — METRONIDAZOLE IN NACL 5-0.79 MG/ML-% IV SOLN
500.0000 mg | Freq: Three times a day (TID) | INTRAVENOUS | Status: DC
  Administered 2017-12-06: 500 mg via INTRAVENOUS
  Filled 2017-12-06 (×2): qty 100

## 2017-12-06 MED ORDER — LIDOCAINE 2% (20 MG/ML) 5 ML SYRINGE
INTRAMUSCULAR | Status: DC | PRN
  Administered 2017-12-06: 100 mg via INTRAVENOUS

## 2017-12-06 MED ORDER — SODIUM CHLORIDE 0.9 % IV SOLN: INTRAVENOUS | Status: DC

## 2017-12-06 MED ORDER — LACTATED RINGERS IV SOLN
INTRAVENOUS | Status: DC | PRN
  Administered 2017-12-06: 10:00:00 via INTRAVENOUS

## 2017-12-06 MED ORDER — POTASSIUM CHLORIDE IN NACL 20-0.9 MEQ/L-% IV SOLN
INTRAVENOUS | Status: DC
  Administered 2017-12-06: 06:00:00 via INTRAVENOUS
  Filled 2017-12-06: qty 1000

## 2017-12-06 MED ORDER — VANCOMYCIN 50 MG/ML ORAL SOLUTION
125.0000 mg | Freq: Four times a day (QID) | ORAL | Status: DC
  Administered 2017-12-06 – 2017-12-10 (×18): 125 mg via ORAL
  Filled 2017-12-06 (×22): qty 2.5

## 2017-12-06 MED ORDER — BACID PO TABS
2.0000 | ORAL_TABLET | Freq: Three times a day (TID) | ORAL | Status: DC
  Administered 2017-12-06 – 2017-12-10 (×13): 2 via ORAL
  Filled 2017-12-06 (×15): qty 2

## 2017-12-06 MED ORDER — ENSURE ENLIVE PO LIQD
237.0000 mL | Freq: Two times a day (BID) | ORAL | Status: DC
  Administered 2017-12-07 – 2017-12-10 (×8): 237 mL via ORAL

## 2017-12-06 MED ORDER — LACTATED RINGERS IV SOLN: INTRAVENOUS | Status: DC

## 2017-12-06 NOTE — Progress Notes (Signed)
Pt wife called requesting pt be made confidential within hospital with no one knowing pt is in hospital.  Pt confused at this time to situation and year.  Pt able to tell me name and intermittently falling asleep.  Noted on chart review pt wife to sign consent for upcoming procedures.  Pt made confidential per wife's wishes and moved to another room on floor.  Educated wife regarding if additional family members looking for patient in hospital no staff are able to inform them of patient's location or that patient is in the hospital and if patient and wife wish for individuals to know of location they must communicate it themselves.

## 2017-12-06 NOTE — Anesthesia Postprocedure Evaluation (Addendum)
Anesthesia Post Note  Patient: William Lowery  Procedure(s) Performed: TRANSESOPHAGEAL ECHOCARDIOGRAM (TEE) (N/A )     Patient location during evaluation: Endoscopy Anesthesia Type: MAC Level of consciousness: awake and alert Pain management: pain level controlled Vital Signs Assessment: post-procedure vital signs reviewed and stable Respiratory status: spontaneous breathing and respiratory function stable Cardiovascular status: stable Postop Assessment: no apparent nausea or vomiting Anesthetic complications: no    Last Vitals:  Vitals:   12/06/17 1100 12/06/17 1115  BP:  (!) 130/93  Pulse: 94 94  Resp: 18 17  Temp:    SpO2: 98% 97%    Last Pain:  Vitals:   12/06/17 1053  TempSrc: Oral  PainSc:                  [name] DANIEL

## 2017-12-06 NOTE — Progress Notes (Signed)
Telephone consent obtained for TEE from patient's spouse Zamar Odwyer. Robbie Lis R witnessed consent through telephone with wife. Consent in chart.   Ave Filter, RN

## 2017-12-06 NOTE — CV Procedure (Signed)
TRANSESOPHAGEAL ECHOCARDIOGRAM (TEE) NOTE  INDICATIONS: crpyogenic stroke  PROCEDURE:   Informed consent was obtained prior to the procedure. The risks, benefits and alternatives for the procedure were discussed and the patient comprehended these risks.  Risks include, but are not limited to, cough, sore throat, vomiting, nausea, somnolence, esophageal and stomach trauma or perforation, bleeding, low blood pressure, aspiration, pneumonia, infection, trauma to the teeth and death.    After a procedural time-out, the patient was given propofol per anesthesia for sedation.  The patient's heart rate, blood pressure, and oxygen saturation are monitored continuously during the procedure.The oropharynx was anesthetized 2 sprays of topical cetacaine.  The transesophageal probe was inserted in the esophagus and stomach without difficulty and multiple views were obtained.  The patient was kept under observation until the patient left the procedure room. The patient left the procedure room in stable condition.   Agitated microbubble saline contrast was administered.  COMPLICATIONS:    There were no immediate complications.  Findings:  1. LEFT VENTRICLE: The left ventricular wall thickness is moderate to severe.  The left ventricular cavity is small in size. Wall motion is hyperdynamic.  LVEF is 65-70%.  2. RIGHT VENTRICLE:  The right ventricle is normal in structure and function without any thrombus or masses.    3. LEFT ATRIUM:  The left atrium is moderately dilated in size without any thrombus or masses.  There is not spontaneous echo contrast ("smoke") in the left atrium consistent with a low flow state.  4. LEFT ATRIAL APPENDAGE:  The left atrial appendage is free of any thrombus or masses. The appendage has single lobes. Pulse doppler indicates moderate flow in the appendage.  5. ATRIAL SEPTUM:  The atrial septum appears intact and is free of thrombus and/or masses.  There is no evidence for  interatrial shunting by color doppler and saline microbubble. Late bubbles were noted in the left atrium consistent with intrahepatic or intrapulmonary shunt.  6. RIGHT ATRIUM:  The right atrium is normal in size and function without any thrombus or masses.  7. MITRAL VALVE:  The mitral valve is normal in structure and function with trivial regurgitation.  There were no vegetations or stenosis.  8. AORTIC VALVE:  The aortic valve is trileaflet, but the leaflets are diffusely thickened and restricted in their mobility. There is a 0.5 x 1.0 cm calcified mass adjacent to the right coronary cusp which restriction motion of that leaflet. There is  no significant regurgitation.  9. TRICUSPID VALVE:  The tricuspid valve is normal in structure and function with Mild regurgitation.  There were no vegetations or stenosis  10.  PULMONIC VALVE:  The pulmonic valve is normal in structure and function with Mild regurgitation.  There were no vegetations or stenosis.   11. AORTIC ARCH, ASCENDING AND DESCENDING AORTA:  There was no Ron Parker et. Al, 1992) atherosclerosis of the ascending aorta, aortic arch, or proximal descending aorta. The ascending aorta is aneurysmal - measuring up to 4.1 cm.  12. PULMONARY VEINS: Anomalous pulmonary venous return was not noted.  13. PERICARDIUM: The pericardium appeared normal and non-thickened.  There is no pericardial effusion.  IMPRESSION:   1. No LAA thrombus 2. Negative for PFO 3. 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 4. Trileaflet, but thickened aortic valve with moderate stenosis - mean gradient 29 mmHg 5. Moderate to severe LVH 6. LVEF 65-70% 7. 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 Ascending aortic aneurysm, measuring at least 4.1 cm  RECOMMENDATIONS:    1. Recommend CT of the aorta to further characterize the adjacent calcified  mass in the coronary sinus as well as the extent of the aortic aneurysm. Recommendations were communicated to Dr. Erlinda Hong with neurology. Also, the patient has been quite somnolent. He wears CPAP at home, but has not be on it here - recommend this is restarted.  Time Spent Directly with the Patient:  60 minutes   Pixie Casino, MD, [facility], [place] Director of the Advanced Lipid Disorders &  Cardiovascular Risk Reduction Clinic Diplomate of the American Board of Clinical Lipidology Attending Cardiologist  Direct Dial: [phone]  Fax: [phone]  Website:  [email or web address] 12/06/2017, 11:08 AM

## 2017-12-06 NOTE — Progress Notes (Signed)
Pt gone for procedure. Will check on pt next day.  Thanks, [place], [place] [phone]

## 2017-12-06 NOTE — Anesthesia Procedure Notes (Signed)
Procedure Name: MAC Date/Time: 12/06/2017 10:15 AM Performed by: Candis Shine, CRNA Pre-anesthesia Checklist: Patient identified, Emergency Drugs available, Suction available, Patient being monitored and Timeout performed Patient Re-evaluated:Patient Re-evaluated prior to induction Oxygen Delivery Method: Nasal cannula Dental Injury: Teeth and Oropharynx as per pre-operative assessment

## 2017-12-06 NOTE — H&P (Signed)
    INTERVAL PROCEDURE H&P  History and Physical Interval Note:  12/06/2017 10:16 AM  William Lowery has presented today for their planned procedure. The various methods of treatment have been discussed with the patient and family. After consideration of risks, benefits and other options for treatment, the patient has consented to the procedure.  The patients' outpatient history has been reviewed, patient examined, and no change in status from most recent office note within the past 30 days. I have reviewed the patients' chart and labs and will proceed as planned. Questions were answered to the patient's satisfaction.   Pixie Casino, MD, [facility], [place] Director of the Advanced Lipid Disorders &  Cardiovascular Risk Reduction Clinic Diplomate of the American Board of Clinical Lipidology Attending Cardiologist  Direct Dial: [phone]  Fax: [phone]  Website:  [email or web address] Cait [email or web address] 12/06/2017, 10:16 AM

## 2017-12-06 NOTE — Progress Notes (Signed)
  Echocardiogram Echocardiogram Transesophageal has been performed.  Bobbye [place] 12/06/2017, 11:06 AM

## 2017-12-06 NOTE — Progress Notes (Signed)
SLP Cancellation Note  Patient Details Name: William Lowery MRN: [id number] DOB: 1966/09/23   Cancelled treatment:       Reason Eval/Treat Not Completed: Patient at procedure or test/unavailable.   [place], CCC-SLP [phone]    William Lowery 12/06/2017, 10:21 AM

## 2017-12-06 NOTE — Progress Notes (Signed)
C. Diff testing came back positive for antigen and C. diff toxin. The assay was ordered due to multiple episodes of diarrhea in one day.   Management of C. Diff reviewed in UpToDate: "Antibiotic management - An important initial step in the treatment of CDI is discontinuation of the inciting antibiotic agent(s) as soon as possible [1]. Treatment with concomitant antibiotics (ie, antibiotics other than those given to treat C. difficile infection) is associated with prolongation of diarrhea, increased likelihood of treatment failure, and increased risk of recurrent CDI [3-5]. If ongoing antibiotics are essential for treatment of the primary infection, if possible, it may be prudent to select antibiotic agents that are less frequently implicated in antibiotic-associated CDI (table 1).  Infection control - Management must also include implementation of infection control policies. Patients with suspected or proven C. difficile infection should be placed on contact precautions, and health care workers should wash hands before and after patient contact. Hand hygiene with soap and water may be more effective than alcohol-based hand sanitizers in removing C. difficile spores, since C. difficile spores are resistant to killing by alcohol. Therefore, use of soap and water is favored over (or in addition to) alcohol-based hand sanitization in the setting of a CDI outbreak, although thus far no studies have demonstrated superiority of soap and water in nonoutbreak settings [1]. (See "Clostridioides (formerly Clostridium) difficile infection: Prevention and control".)  Diarrhea management - In addition, antimotility agents such as loperamide and opiates have traditionally been avoided in CDI, but the evidence that they cause harm is equivocal [6-8]. Supportive care with attention to correction of fluid losses and electrolyte imbalances is also important. Patients may have regular diet as tolerated."  A/R: C. Difficile  colitis in a 51 year old male admitted for right PCA infarction  1. The patient is classifiable as having severe C. Difficile infection as his WBC count is > 15,000/mL.  2. No inciting antibiotic on his medications list.  3. Contact precautions 4. Increasing IVF rate to 75 cc/hr. Changing NS to NS+K due to hypokalemia 5. Start IV metronidazole 500 mg IV TID for 10 days. AM stroke team can consider switching to PO or adding vancomycin if indicated.    Electronically signed: Dr. Kerney Elbe

## 2017-12-06 NOTE — Progress Notes (Signed)
Nutrition Follow-up  DOCUMENTATION CODES:   Morbid obesity  INTERVENTION:  Ensure Enlive po BID, each supplement provides 350 kcal and 20 grams of protein  NUTRITION DIAGNOSIS:   Inadequate oral intake related to poor appetite as evidenced by per patient/family report.  GOAL:   Patient will meet greater than or equal to 90% of their needs  MONITOR:   I & O's, Labs, Weight trends, Skin, Supplement acceptance, PO intake  ASSESSMENT:   Pt with PMH of HTN, HLD, OSA/OHS with CPAP at HD, asthma admitted with L PCA stroke s/p tPA and IR stent.   01/28 Extubated 01/29 Diet advanced to Heart Healthy 01/31 TEE  Meal Completion 25% Patient has been sleepy since extubation. Not eating much.  Labs reviewed:  K+ 3.0 CBGs 108, 90  Medications reviewed and include:  40 K+, Lactobacillus NS at 4mL/hr  Diet Order:  Fall precautions Diet Heart Room service appropriate? Yes; Fluid consistency: Thin  EDUCATION NEEDS:   No education needs have been identified at this time  Skin:  Skin Assessment: Reviewed RN Assessment  Last BM:  unknown  Height:   Ht Readings from Last 1 Encounters:  12/01/17 5\' 10"  (1.778 m)    Weight:   Wt Readings from Last 1 Encounters:  12/06/17 (!) 313 lb 15 oz (142.4 kg)    Ideal Body Weight:  75.4 kg  BMI:  Body mass index is 45.04 kg/m.  Estimated Nutritional Needs:   Kcal:  2200-2600 calories  Protein:  125-155 grams  Fluid:  2.2-2.6L   Satira Anis. Malon Siddall, MS, RD LDN Inpatient Clinical Dietitian Pager [phone]

## 2017-12-06 NOTE — Progress Notes (Signed)
Physical Therapy Treatment Patient Details Name: William Lowery MRN: [id number] DOB: 1966/08/29 Today's Date: 12/06/2017    History of Present Illness 52 y.o. male with PMHx: HTN, sleep apnea, right cerebellar CVA. Admitted with L PCA, R SCA, bil PICA infarcts s/p tPA and thrombectomy with occluded stents, intubated 1/26-1/28    PT Comments    Patient is making progress toward mobility goals. Pt continues to require +2 assist for safety with OOB mobility. Current plan remains appropriate.    Follow Up Recommendations  CIR;Supervision/Assistance - 24 hour     Equipment Recommendations  Rolling walker with 5" wheels    Recommendations for Other Services Rehab consult     Precautions / Restrictions Precautions Precautions: Fall Precaution Comments: Visual deficits (diplopia and possible R field cut) and aphasia Restrictions Weight Bearing Restrictions: No    Mobility  Bed Mobility Overal bed mobility: Needs Assistance Bed Mobility: Supine to Sit     Supine to sit: HOB elevated;Min assist     General bed mobility comments: cues for sequencing; use of rail; assist to scoot right hip forward and to elevate trunk  Transfers Overall transfer level: Needs assistance Equipment used: 2 person hand held assist Transfers: Sit to/from Stand;Stand Pivot Transfers Sit to Stand: Min assist;From elevated surface;+2 physical assistance Stand pivot transfers: Mod assist;+2 physical assistance       General transfer comment: assist to power up into standing and for balance  Ambulation/Gait Ambulation/Gait assistance: Mod assist;+2 physical assistance   Assistive device: 2 person hand held assist Gait Pattern/deviations: Step-to pattern;Wide base of support;Decreased stride length Gait velocity: slowed   General Gait Details: pt was able to side step and take a few steps forward and backwards prior to becoming fatigued   Stairs            Wheelchair Mobility     Modified Rankin (Stroke Patients Only) Modified Rankin (Stroke Patients Only) Pre-Morbid Rankin Score: No symptoms Modified Rankin: Moderately severe disability     Balance Overall balance assessment: Needs assistance   Sitting balance-Leahy Scale: Poor Sitting balance - Comments: pt able to sit EOB without assist with tendency for right posterior lean with verbal and tactile cues to correct Postural control: Posterior lean;Right lateral lean   Standing balance-Leahy Scale: Poor Standing balance comment: reliant on bil UE support for standing                            Cognition Arousal/Alertness: Awake/alert Behavior During Therapy: Flat affect;WFL for tasks assessed/performed Overall Cognitive Status: Impaired/Different from baseline Area of Impairment: Orientation;Memory;Following commands;Safety/judgement;Problem solving;Attention;Awareness                 Orientation Level: Disoriented to;Time;Situation(states hes in the hospital but did not know which one) Current Attention Level: Sustained Memory: Decreased short-term memory Following Commands: Follows one step commands with increased time Safety/Judgement: Decreased awareness of deficits;Decreased awareness of safety Awareness: Emergent Problem Solving: Slow processing;Requires verbal cues;Requires tactile cues        Exercises      General Comments        Pertinent Vitals/Pain Pain Assessment: No/denies pain    Home Living                      Prior Function            PT Goals (current goals can now be found in the care plan section) Acute Rehab PT Goals PT Goal  Formulation: With patient/family Time For Goal Achievement: 12/18/17 Potential to Achieve Goals: Fair Progress towards PT goals: Progressing toward goals    Frequency    Min 4X/week      PT Plan Current plan remains appropriate    Co-evaluation PT/OT/SLP Co-Evaluation/Treatment: Yes             AM-PAC PT "6 Clicks" Daily Activity  Outcome Measure  Difficulty turning over in bed (including adjusting bedclothes, sheets and blankets)?: A Lot Difficulty moving from lying on back to sitting on the side of the bed? : Unable Difficulty sitting down on and standing up from a chair with arms (e.g., wheelchair, bedside commode, etc,.)?: Unable Help needed moving to and from a bed to chair (including a wheelchair)?: A Lot Help needed walking in hospital room?: A Lot Help needed climbing 3-5 steps with a railing? : Total 6 Click Score: 9    End of Session Equipment Utilized During Treatment: Gait belt Activity Tolerance: Patient tolerated treatment well Patient left: with call bell/phone within reach;in chair;with chair alarm set Nurse Communication: Mobility status PT Visit Diagnosis: Other abnormalities of gait and mobility (R26.89);Unsteadiness on feet (R26.81);Other symptoms and signs involving the nervous system (R29.898)     Time: 6226-3335 PT Time Calculation (min) (ACUTE ONLY): 30 min  Charges:  $Gait Training: 8-22 mins $Therapeutic Activity: 8-22 mins                    G Codes:       Earney Navy, PTA Pager: [phone]     Darliss Cheney 12/06/2017, 4:42 PM

## 2017-12-06 NOTE — Anesthesia Preprocedure Evaluation (Signed)
Anesthesia Evaluation  Patient identified by MRN, date of birth, ID band Patient awake    Reviewed: Allergy & Precautions, NPO status , Patient's Chart, lab work & pertinent test results  Airway Mallampati: III  TM Distance: >3 FB Neck ROM: Full    Dental no notable dental hx. (+) Dental Advisory Given   Pulmonary asthma , sleep apnea ,    Pulmonary exam normal        Cardiovascular hypertension, Pt. on medications + Valvular Problems/Murmurs AS and AI  Rhythm:Regular + Systolic murmurs Study Conclusions  - Left ventricle: The cavity size was normal. Wall thickness was   increased in a pattern of mild LVH. Systolic function was normal.   The estimated ejection fraction was in the range of 55% to 60%.   Wall motion was normal; there were no regional wall motion   abnormalities. Left ventricular diastolic function parameters   were normal. - Aortic valve: Moderately calcified annulus. Trileaflet;   moderately thickened leaflets. There was moderate stenosis. There   was mild regurgitation.    Neuro/Psych CVA, Residual Symptoms negative neurological ROS  negative psych ROS   GI/Hepatic negative GI ROS, Neg liver ROS,   Endo/Other  Morbid obesity  Renal/GU negative Renal ROS     Musculoskeletal   Abdominal   Peds  Hematology   Anesthesia Other Findings   Reproductive/Obstetrics                             Anesthesia Physical Anesthesia Plan  ASA: IV  Anesthesia Plan: MAC   Post-op Pain Management:    Induction:   PONV Risk Score and Plan: 1 and Propofol infusion  Airway Management Planned: Simple Face Mask  Additional Equipment:   Intra-op Plan:   Post-operative Plan:   Informed Consent: I have reviewed the patients History and Physical, chart, labs and discussed the procedure including the risks, benefits and alternatives for the proposed anesthesia with the patient or  authorized representative who has indicated his/her understanding and acceptance.   Dental advisory given  Plan Discussed with: CRNA, Anesthesiologist and Surgeon  Anesthesia Plan Comments:         Anesthesia Quick Evaluation

## 2017-12-06 NOTE — Addendum Note (Signed)
Addendum  created 12/06/17 1256 by Duane Boston, MD   Sign clinical note

## 2017-12-06 NOTE — Progress Notes (Signed)
Asked to enter vancomycin PO for CDiff treatment per ID recommendations.   Vancomycin 125mg  PO four times daily x14 days Flagyl discontinued Stopped Protonix as patient was not on PTA, does not have an active bleed, and appears he was only started on it for stress-ulcer prophylaxis as PPIs can exacerbate CDiff  Pharmacy will sign off as no dose adjustments required. Please call with questions.  William Lowery, PharmD, BCPS Clinical Pharmacist Clinical Phone for 12/06/2017 until 3:30pm: [phone] If after 3:30pm, please call main pharmacy at [phone] 12/06/2017 12:45 PM

## 2017-12-06 NOTE — Progress Notes (Signed)
CRITICAL VALUE ALERT  Critical Value:  c-diff PCR  Date & Time Notied:  12/06/17 2248  Provider Notified: Bruce Donath, MD  Orders Received/Actions taken: orders received

## 2017-12-06 NOTE — Transfer of Care (Signed)
Immediate Anesthesia Transfer of Care Note  Patient: William Lowery  Procedure(s) Performed: TRANSESOPHAGEAL ECHOCARDIOGRAM (TEE) (N/A )  Patient Location: Endoscopy Unit  Anesthesia Type:MAC  Level of Consciousness: awake, alert  and oriented  Airway & Oxygen Therapy: Patient Spontanous Breathing and Patient connected to nasal cannula oxygen  Post-op Assessment: Report given to RN and Post -op Vital signs reviewed and stable  Post vital signs: Reviewed and stable  Last Vitals:  Vitals:   12/06/17 0937 12/06/17 1053  BP: (!) 149/96 (!) 132/96  Pulse:  94  Resp: 18 (!) 26  Temp: 36.8 C   SpO2: 96% 95%    Last Pain:  Vitals:   12/06/17 0937  TempSrc: Oral  PainSc:          Complications: No apparent anesthesia complications

## 2017-12-06 NOTE — Progress Notes (Signed)
STROKE TEAM PROGRESS NOTE   SUBJECTIVE (INTERVAL HISTORY) Wife is at bedside. Patient is sleepy but easily arouses, answers questions and follows all commands. Diarrhea yesterday and CDiff positive overnight. TEE shows calcified mass vs vegetation. CT Chest pending  OBJECTIVE Temp:  [98.1 F (36.7 C)-100 F (37.8 C)] 98.2 F (36.8 C) (01/31 1401) Pulse Rate:  [84-98] 93 (01/31 1401) Cardiac Rhythm: Normal sinus rhythm (01/31 0700) Resp:  [16-26] 16 (01/31 1401) BP: (120-149)/(75-96) 145/93 (01/31 1401) SpO2:  [95 %-99 %] 96 % (01/31 1401) Weight:  [142.4 kg (313 lb 15 oz)] 142.4 kg (313 lb 15 oz) (01/31 0500)  CBC:  Recent Labs  Lab 12/01/17 1550  12/03/17 0422  12/05/17 0308 12/06/17 0318  WBC 10.5   < > 12.6*   < > 14.2* 16.5*  NEUTROABS 7.0  --  10.3*  --   --   --   HGB 17.1*   < > 13.9   < > 13.7 14.4  HCT 48.8   < > 40.8   < > 39.7 42.7  MCV 88.7   < > 91.7   < > 89.0 88.6  PLT 243   < > 196   < > 212 231   < > = values in this interval not displayed.    Basic Metabolic Panel:  Recent Labs  Lab 12/03/17 1109 12/04/17 0250 12/05/17 0308 12/06/17 0318  NA  --  143 142 142  K  --  3.7 3.1* 3.0*  CL  --  111 108 107  CO2  --  _0 GLUCOSE  --  121* 112* 92  BUN  --  _1 CREATININE  --  0.73 0.74 0.72  CALCIUM  --  8.3* 8.2* 8.5*  MG 1.9  --   --  2.0  PHOS 3.2 2.6  --  3.4    Lipid Panel:     Component Value Date/Time   CHOL 144 12/02/2017 0535   TRIG 246 (H) 12/02/2017 0535   TRIG 244 (H) 12/02/2017 0535   HDL 37 (L) 12/02/2017 0535   CHOLHDL 3.9 12/02/2017 0535   VLDL 49 (H) 12/02/2017 0535   LDLCALC 58 12/02/2017 0535   HgbA1c:  Lab Results  Component Value Date   HGBA1C 5.7 (H) 12/02/2017   Urine Drug Screen: No results found for: LABOPIA, COCAINSCRNUR, LABBENZ, AMPHETMU, THCU, LABBARB  Alcohol Level No results found for: Pony I have personally reviewed the radiological images below and agree with the radiology  interpretations.  Ct Angio Head W Or Wo Contrast Ct Angio Neck W Or Wo Contrast  Ct Cerebral Perfusion W Contrast 12/01/2017 IMPRESSION:  1. 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 14 cc of left PCA distribution penumbra with no infarct by CTP.  2. 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 No embolic source identified in the head or neck. No atheromatous changes or chronic stenoses.  3. 2.5 cm right thyroid nodule, recommend sonographic follow-up.   Ct Head Wo Contrast 12/01/2017 IMPRESSION:  1. Subarachnoid hyperdensity within the posterior left temporal lobe and left occipital lobe, which could be contrast staining or petechial/subarachnoid hemorrhage.  2. No herniation, hydrocephalus or intraparenchymal hematoma.   Ct Head Code Stroke Wo Contrast 12/01/2017 IMPRESSION:  1. Possible hyperdense left P1 segment.  2. Negative for hemorrhage or visible acute infarct.  3. Small remote right inferior cerebellar infarct.   Mri and Mra Head Wo Contrast 12/03/2017 IMPRESSION: 1. Multifocal  acute ischemia within the posterior circulation territories, predominantly within the left PCA distribution, including the left occipital lobe and left thalamus. Numerous smaller foci of acute ischemia throughout both cerebellar hemispheres. 2. Minimal susceptibility blooming in the left occipital lobe, likely indicating the presence of a minimal amount of petechial blood. 3. Stent within the P1 segment of the left PCA without flow related enhancement distal to the stent. The stent may be occluded. This could be further assessed with CTA of the head, if clinically warranted.   CT HEAD  1. Continued decrease in subarachnoid high-density along the posterior left cerebral hemisphere. No hydrocephalus. 2. Moderate acute left occipital infarct without detected progression. 3. Question small right superior cerebellar infarct.  Transthoracic Echocardiogram - Left ventricle: The cavity size was  normal. Wall thickness was   increased in a pattern of mild LVH. Systolic function was normal.   The estimated ejection fraction was in the range of 55% to 60%.   Wall motion was normal; there were no regional wall motion   abnormalities. Left ventricular diastolic function parameters   were normal. - Aortic valve: Moderately calcified annulus. Trileaflet;   moderately thickened leaflets. There was moderate stenosis. There   was mild regurgitation. Mean gradient (S): 22 mm Hg. Valve area   (VTI): 1.36 cm^2. Valve area (Vmax): 1.17 cm^2. Valve area   (Vmean): 1.25 cm^2. - Mitral valve: Mildly calcified annulus. Mildly thickened leaflets  Cerebral angiogram - Dr. Estanislado Pandy 12/01/2017 S/P bilateral common carotid and vertebral artery angiograms,followed  By partial revascularization of Lt PCA with x 2 passes with solitaire 45m x 40 mm retriever device ,x 1 pass with the embotrap retriever device  And placement of intracranial stent in the Lt PCA for recurrent occlusion after retriever passes achieving a TICI 2b re pertfusion,followe by .75 mg og aggrastat IA into the basilar artery with slow revascularization of intrastent occlusion.due to acute platelet aggregation. Also loaded with brilinta 180 mg and aspirin 81 mg via orogastric tube prior to stent placement  PORTABLE CHEST 1 VIEW  12/05/2017 09:45 COMPARISON:  12/03/2017 and earlier. IMPRESSION: 1. Extubated and enteric tube removed. 2.  No acute cardiopulmonary abnormality.  Lower Venous Study 12/04/2017 at 1:21:55 PM. Final Interpretation: Right: There is no evidence of deep vein thrombosis in the lower extremity.There is no evidence of superficial venous thrombosis. No cystic structure found in the popliteal fossa. Left: There is no evidence of deep vein thrombosis in the lower extremity.There is no evidence of superficial venous thrombosis. No cystic structure found in the popliteal fossa.  TEE IMPRESSION:   1. No LAA  thrombus 2. Negative for PFO 3. 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 4. Trileaflet, but thickened aortic valve with moderate stenosis - mean gradient 29 mmHg 5. Moderate to severe LVH 6. LVEF 65-70% 7. Small late right to left shunt, suggestive of intrapulmonary or intrahepatic shunting 8. Ascending aortic aneurysm, measuring at least 4.1 cm   PHYSICAL EXAM Vitals:   12/06/17 1100 12/06/17 1115 12/06/17 1135 12/06/17 1401  BP:  (!) 130/93 (!) 129/96 (!) 145/93  Pulse: 94 94 87 93  Resp: _0 Temp:   98.1 F (36.7 C) 98.2 F (36.8 C)  TempSrc:   Axillary Axillary  SpO2: 98% 97% 95% 96%  Weight:      Height:        Temp:  [98.1 F (36.7 C)-100 F (37.8 C)]  98.2 F (36.8 C) (01/31 1401) Pulse Rate:  [84-98] 93 (01/31 1401) Resp:  [16-26] 16 (01/31 1401) BP: (120-149)/(75-96) 145/93 (01/31 1401) SpO2:  [95 %-99 %] 96 % (01/31 1401) Weight:  [142.4 kg (313 lb 15 oz)] 142.4 kg (313 lb 15 oz) (01/31 0500)  General - Well nourished, well developed, mildly lethargic.  Ophthalmologic - Fundi not visualized due to small pupils.  Cardiovascular - Regular rate and rhythm.  Neuro - awake alert but mildly lethargic. Eyes open and following simple commands. PERRL, seems to have right upper quadrantanopia but not quite cooperative on exam. Eye moving both directions, no gaze preference. No facial droop or tongue deviation. LUE and LLE 5/5. RUE 4/5 proximal with pronator drift but 5/5 distal. RLE 5/5. DTR 1+ and no babinski. Sensation decreased on the right, coordination intact and gait not tested.   12/05/2017: Pt neuro stable, lethargic but visual field full on today's exam, still has right UE weakness 4/5 proximal and distal, RLE 5/5. Pending TEE and loop as well as CIR admission tomorrow. Continue ASA and brilinta. Add lipitor 77m for stent placement. Continue close  monitoring.  12/06/2017: Neuro exam stable. +CDiff overnight and PO Vanc in progress. Continues to have diarrhea. TEE shows calcified mass vs vegetation. CT chest pending. Will continue to monitor closely. CIR discharge on hold until medically stable for discharge    ASSESSMENT/PLAN Mr. DNajir Roopis a 52y.o. male with history of asthma, hypertension, and obstructive sleep apnea (wears C Pap) presenting with right-sided numbness. The patient received TPA on Saturday, 12/01/2017 at [facility] -> Interventional radiology.  Stroke: Left PCA infarct with P1 occlusion s/p tPA and IR with TICI 2b reperfusion and left P1 stent - possibly embolic - source unknown. Additional right SCA and b/l PICA infarcts likely due to procedure  Resultant right hemiparesthesia, right upper quadrantanopia  CT head repeat decreased subarachnoid hyperdensity, left PCA infact  MRI head - left PCA, right SCA and b/l PICA infarcts  MRA head - left PCA occlusion s/p stent, right SCA occlusion  2D Echo - EF 55-60%  LE venous doppler no DVT  Hypercoagulable work up pending  TEE - calcified mass vs vegetation, CT Chest pending  LDL - 58  HgbA1c - 5.7  VTE prophylaxis - heparin subq Fall precautions Diet Heart Room service appropriate? Yes; Fluid consistency: Thin  No antithrombotic prior to admission, now on aspirin 81 mg daily and Brilinta.   Patient counseled to be compliant with his antithrombotic medications  Ongoing aggressive stroke risk factor management  Therapy recommendations:  CIR  Disposition:  CIR once medically stable  Hypertension  Stable, started Losartan 50 mg 12/05/2017  Permissive hypertension (OK if < 180/105) but gradually normalize in 5-7 days  Long-term BP goal normotensive  Other Stroke Risk Factors  Obesity, Body mass index is 45.04 kg/m., recommend weight loss, diet and exercise as appropriate   Hx stroke/TIA - remote right PICA infarct by imaging  OSA, on CPAP at  home, RT Consulted, Incentive Spirometry  Other Active Problems  Diarrhea - CDiff positive  CDiff - Vancomycin PO x 2 weeks per ID recs, Appreciate assistance  Hypokalemia - replacement in progress  Leukocytosis - remains afebrile, on Vanc PO for CDiff, repeat labs in AM  Calcified Mass vs Cardiac Vegetation - CT Chest PENDING       Hospital day # 5  MRenie OraStroke Neurology Team 12/06/2017 3:35 PM  ATTENDING NOTE: I reviewed above note and agree  with the assessment and plan. I have made any additions or clarifications directly to the above note. Pt was seen and examined.   Met with wife in room. Updated her about pt current condition, treatment plan. pt had diarrhea and found to have C diff. Put on vanco po. TEE today concerning for AV calcified mass adjacent to the right coronary cusp which restrict leaflet motion. Will do CTA aortic artery. Pt wife brought CPAP here and will use CPAP at night for OSA. Pending CIR. Still has right UE paresis but visual field deficit has resolved.    William Hawking, MD PhD Stroke Neurology 12/06/2017 11:10 PM     To contact Stroke Continuity provider, please refer to [email or web address] After hours, contact General Neurology

## 2017-12-06 NOTE — Progress Notes (Signed)
I continue to assist pt's wife with clarifying his medical insurance coverage. Uninsured to date. I have left a message with her Insurance agency to contact me to clarify with Lovena Le [phone] ext [phone]. [phone]

## 2017-12-07 ENCOUNTER — Encounter (HOSPITAL_COMMUNITY): Payer: Self-pay | Admitting: Internal Medicine

## 2017-12-07 DIAGNOSIS — D72829 Elevated white blood cell count, unspecified: Secondary | ICD-10-CM

## 2017-12-07 LAB — CBC
HEMATOCRIT: 41.1 % (ref 39.0–52.0)
HEMOGLOBIN: 14.2 g/dL (ref 13.0–17.0)
MCH: 30.7 pg (ref 26.0–34.0)
MCHC: 34.5 g/dL (ref 30.0–36.0)
MCV: 88.8 fL (ref 78.0–100.0)
Platelets: 243 10*3/uL (ref 150–400)
RBC: 4.63 MIL/uL (ref 4.22–5.81)
RDW: 13.2 % (ref 11.5–15.5)
WBC: 12.6 10*3/uL — ABNORMAL HIGH (ref 4.0–10.5)

## 2017-12-07 LAB — BASIC METABOLIC PANEL
ANION GAP: 11 (ref 5–15)
BUN: 15 mg/dL (ref 6–20)
CALCIUM: 8.1 mg/dL — AB (ref 8.9–10.3)
CHLORIDE: 108 mmol/L (ref 101–111)
CO2: 22 mmol/L (ref 22–32)
Creatinine, Ser: 0.7 mg/dL (ref 0.61–1.24)
GFR calc Af Amer: >60 mL/min (ref >60)
GFR calc non Af Amer: >60 mL/min (ref >60)
GLUCOSE: 84 mg/dL (ref 65–99)
Potassium: 3.2 mmol/L — ABNORMAL LOW (ref 3.5–5.1)
Sodium: 141 mmol/L (ref 135–145)

## 2017-12-07 LAB — GLUCOSE, CAPILLARY
GLUCOSE-CAPILLARY: 110 mg/dL — AB (ref 65–99)
GLUCOSE-CAPILLARY: 70 mg/dL (ref 65–99)
Glucose-Capillary: 88 mg/dL (ref 65–99)

## 2017-12-07 MED ORDER — POTASSIUM CHLORIDE CRYS ER 20 MEQ PO TBCR
40.0000 meq | EXTENDED_RELEASE_TABLET | Freq: Three times a day (TID) | ORAL | Status: DC
  Administered 2017-12-07 – 2017-12-10 (×11): 40 meq via ORAL
  Filled 2017-12-07 (×11): qty 2

## 2017-12-07 NOTE — Progress Notes (Signed)
SLP Cancellation Note  Patient Details Name: Cottrell Gentles MRN: [id number] DOB: Apr 01, 1966   Cancelled treatment:       Reason Eval/Treat Not Completed: Pt unavailable x2 due to bowel issues.   [name] 12/07/2017, 3:27 PM

## 2017-12-07 NOTE — Progress Notes (Signed)
Physical Therapy Treatment Patient Details Name: William Lowery MRN: [id number] DOB: 1965/12/02 Today's Date: 12/07/2017    History of Present Illness 52 y.o. male with PMHx: HTN, sleep apnea, right cerebellar CVA. Admitted with L PCA, R SCA, bil PICA infarcts s/p tPA and thrombectomy with occluded stents, intubated 1/26-1/28    PT Comments    Pt very motivated to work with PT and making good progress towards his goals today. Pt was min A for bed mobility and sit>stand from elevated bed. Pt able to stand for 5 minutes for pericare before walking 10 feet with minA and a close chair follow. D/C plans remain appropriate. PT will continue to follow acutely until d/c.    Follow Up Recommendations  CIR;Supervision/Assistance - 24 hour     Equipment Recommendations  Rolling walker with 5" wheels    Recommendations for Other Services Rehab consult     Precautions / Restrictions Precautions Precautions: Fall Restrictions Weight Bearing Restrictions: No    Mobility  Bed Mobility Overal bed mobility: Needs Assistance Bed Mobility: Supine to Sit     Supine to sit: Min assist;HOB elevated     General bed mobility comments: assist to initiate moving LEs off bed and to lift trunk   Transfers Overall transfer level: Needs assistance Equipment used: Rolling walker (2 wheeled) Transfers: Sit to/from [facility] Sit to Stand: Min assist;+2 physical assistance;From elevated surface Stand pivot transfers: Min assist       General transfer comment: minAx2 for power up and steadying in RW, vc for hand placement and hand over hand assist for R UE placement on walker  Ambulation/Gait Ambulation/Gait assistance: Min assist;+2 safety/equipment Ambulation Distance (Feet): 10 Feet Assistive device: Rolling walker (2 wheeled) Gait Pattern/deviations: Step-to pattern;Wide base of support;Decreased stride length Gait velocity: slowed Gait velocity interpretation: Below normal  speed for age/gender General Gait Details: minAx2 for steadying with RW, vc for proximity to RW, and increased foot clearance    Modified Rankin (Stroke Patients Only) Modified Rankin (Stroke Patients Only) Pre-Morbid Rankin Score: No symptoms Modified Rankin: Moderately severe disability     Balance Overall balance assessment: Needs assistance Sitting-balance support: Feet supported Sitting balance-Leahy Scale: Good Sitting balance - Comments: able to don/doff socks EOB    Standing balance support: During functional activity;No upper extremity supported Standing balance-Leahy Scale: Poor Standing balance comment: required minA for standing for pericare, stood 5 minutes                            Cognition Arousal/Alertness: Awake/alert Behavior During Therapy: Flat affect;WFL for tasks assessed/performed Overall Cognitive Status: Impaired/Different from baseline Area of Impairment: Attention;Following commands;Safety/judgement;Awareness;Problem solving                   Current Attention Level: Selective Memory: Decreased short-term memory Following Commands: Follows one step commands consistently Safety/Judgement: Decreased awareness of deficits Awareness: Intellectual Problem Solving: Slow processing;Decreased initiation;Difficulty sequencing;Requires verbal cues;Requires tactile cues General Comments: Pt with flat affect.  He struggles with abstract concepts and humor.  He requires increased time to initiate and complete tasks.  cues provided for problem solving       Exercises Other Exercises Other Exercises: Pt instructed to work on tapping fingers and with turning objects in fingertips to improve reciprocal movements of thumb and digits     General Comments General comments (skin integrity, edema, etc.): wife present during session      Pertinent Vitals/Pain Pain Assessment: No/denies pain  PT Goals (current goals can now be found  in the care plan section) Acute Rehab PT Goals PT Goal Formulation: With patient/family Time For Goal Achievement: 12/18/17 Potential to Achieve Goals: Fair Progress towards PT goals: Progressing toward goals    Frequency    Min 4X/week      PT Plan Current plan remains appropriate       AM-PAC PT "6 Clicks" Daily Activity  Outcome Measure  Difficulty turning over in bed (including adjusting bedclothes, sheets and blankets)?: A Lot Difficulty moving from lying on back to sitting on the side of the bed? : Unable Difficulty sitting down on and standing up from a chair with arms (e.g., wheelchair, bedside commode, etc,.)?: Unable Help needed moving to and from a bed to chair (including a wheelchair)?: A Lot Help needed walking in hospital room?: A Lot Help needed climbing 3-5 steps with a railing? : Total 6 Click Score: 9    End of Session Equipment Utilized During Treatment: Gait belt Activity Tolerance: Patient tolerated treatment well Patient left: with call bell/phone within reach;in chair;with chair alarm set Nurse Communication: Mobility status PT Visit Diagnosis: Other abnormalities of gait and mobility (R26.89);Unsteadiness on feet (R26.81);Other symptoms and signs involving the nervous system (J69.678)     Time: 1549-1610 PT Time Calculation (min) (ACUTE ONLY): 21 min  Charges:  $Therapeutic Activity: 8-22 mins                    G Codes:       Caniya Tagle B. Migdalia Dk PT, DPT Acute Rehabilitation  [phone] Pager [phone]     [place] 12/07/2017, 5:12 PM

## 2017-12-07 NOTE — PMR Pre-admission (Signed)
PMR Admission Coordinator Pre-Admission Assessment  Patient: William Lowery is an 52 y.o., male MRN: [id number] DOB: 1966/01/29 Height: 5\' 10"  (177.8 cm) Weight: (!) 138.3 kg (304 lb 14.3 oz)              Insurance Information HMO:     PPO:      PCP:      IPA:      80/20:      OTHER:  PRIMARY: uninsured       Patient's medical insurance had lapsed 03/05/2017. Pt and wife are aware. I left two messages for pt's insurance agent, Lovena Le, at [employer] at [phone] ext [phone] with no return call. I informed pt's wife on 12/07/2017 that unless she provided me with another medical plan information, that pt would be admitted as uninsured. [name] was in agreement.  Wife states she will follow up with insurance issues with her agent.  SECONDARY: none       Medicaid Application Date:       Case Manager:  Disability Application Date:       Case Worker:   Emergency Contact Information Contact Information    Name Relation Home Work Mobile   Shelda Jakes Spouse   [phone]     Current Medical History  Patient Admitting Diagnosis: left PCA infarct  History of Present Illness:   William Lowery a 52 y.o.right handedmalewith history of hypertension, sleep apnea, obesity. Presented 12/01/2017 with right-sided numbness as well as intermittent headache and right eye visual blurriness. Cranial CT scan negative for hemorrhage or visible acute infarction. Small remote right inferior cerebellar infarction. CT angiogram head and neck showed left PCA occlusion beginning at its origin. Cerebral angiogram interventional radiology and underwent thrombectomy.Patient remained intubated through 12/03/2017. Follow-up MRI showed multifocal acute ischemia within the posterior circulation territories predominantly within the left PCA distribution, including left occipital lobe and left thalamus.Echocardiogram with ejection fraction of 55-60%. Lower extremity Dopplers negative. TEE  12/06/2017 showed  ejection fraction of 70%.. No thrombus and negative for PFO. 0.51.0 cm calcified mass adjacent to the [place] that restricts movement question vegetation with CT angiogram of the chest completed for confirmation showing a 4.3 cm ascending thoracic aortic aneurysm and recommendations for annual imaging follow-up by CTA or MRA. Neurology consulted presently maintained on aspirin as well as Brilinta. Tolerating a regular consistency diet. Bouts of hypokalemia with supplement added. C. difficile specimen 12/06/2017 positive placed on vancomycin orally 125 mg 4 times a day 14 days   Total: 3 NIHSS    Past Medical History  Past Medical History:  Diagnosis Date  . Asthma    hx of  . Heart murmur    as an infant  . Hypertension   . Sleep apnea    wears CPAP    Family History  family history is not on file.  Prior Rehab/Hospitalizations:  Has the patient had major surgery during 100 days prior to admission? No  Current Medications   Current Facility-Administered Medications:  .  0.9 %  sodium chloride infusion, , Intravenous, Continuous, Costello, Mary A, NP, Last Rate: 50 mL/hr at 12/10/17 0525 .  acetaminophen (TYLENOL) tablet 650 mg, 650 mg, Oral, Q4H PRN, 650 mg at 12/05/17 2024 **OR** [DISCONTINUED] acetaminophen (TYLENOL) solution 650 mg, 650 mg, Per Tube, Q4H PRN, 650 mg at 12/03/17 0839 **OR** [DISCONTINUED] acetaminophen (TYLENOL) suppository 650 mg, 650 mg, Rectal, Q4H PRN, Deveshwar, Sanjeev, MD .  aspirin EC tablet 81 mg, 81 mg, Oral, Daily, Rosalin Hawking, MD,  81 mg at 12/10/17 1009 .  atorvastatin (LIPITOR) tablet 10 mg, 10 mg, Oral, q1800, Rosalin Hawking, MD, 10 mg at 12/09/17 1738 .  feeding supplement (ENSURE ENLIVE) (ENSURE ENLIVE) liquid 237 mL, 237 mL, Oral, BID BM, Rosalin Hawking, MD, 237 mL at 12/10/17 1014 .  lactobacillus acidophilus (BACID) tablet 2 tablet, 2 tablet, Oral, TID, Costello, Mary A, NP, 2 tablet at 12/10/17 1009 .  losartan (COZAAR) tablet 50 mg, 50 mg, Oral, Daily,  Costello, Mary A, NP, 50 mg at 12/10/17 1009 .  menthol-cetylpyridinium (CEPACOL) lozenge 3 mg, 1 lozenge, Oral, PRN, Kerney Elbe, MD .  ondansetron [facility]) injection 4 mg, 4 mg, Intravenous, Q6H PRN, Rosalin Hawking, MD, 4 mg at 12/03/17 1557 .  potassium chloride SA (K-DUR,KLOR-CON) CR tablet 40 mEq, 40 mEq, Oral, TID, Costello, Mary A, NP, 40 mEq at 12/10/17 1008 .  senna-docusate (Senokot-S) tablet 1 tablet, 1 tablet, Oral, QHS PRN, Amie Portland, MD .  ticagrelor (BRILINTA) tablet 90 mg, 90 mg, Oral, BID, Deveshwar, Sanjeev, MD, 90 mg at 12/10/17 1009 .  vancomycin (VANCOCIN) 50 mg/mL oral solution 125 mg, 125 mg, Oral, QID, Bajbus, Lauren D, RPH, 125 mg at 12/10/17 1000 .  Zinc Oxide (TRIPLE PASTE) 12.8 % ointment, , Topical, PRN, Rosalin Hawking, MD, 1 application at 81/82/99 3716  Patients Current Diet: Fall precautions Diet Heart Room service appropriate? Yes; Fluid consistency: Thin  Precautions / Restrictions Precautions Precautions: Fall Precaution Comments: Visual deficits (diplopia and possible R field cut) and aphasia Restrictions Weight Bearing Restrictions: No   Has the patient had 2 or more falls or a fall with injury in the past year?No  Prior Activity Level Community (5-7x/wk): Independent, driving, [place] transportation with his Microbiologist / [place] Devices/Equipment: None Home Equipment: None  Prior Device Use: Indicate devices/aids used by the patient prior to current illness, exacerbation or injury? None of the above  Prior Functional Level Prior Function Level of Independence: Independent Comments: ADLs, IADLs, driving, and works full time and owns [employer]  Self Care: Did the patient need help bathing, dressing, using the toilet or eating?  Independent  Indoor Mobility: Did the patient need assistance with walking from room to room (with or without device)? Independent  Stairs: Did the patient  need assistance with internal or external stairs (with or without device)? Independent  Functional Cognition: Did the patient need help planning regular tasks such as shopping or remembering to take medications? Independent  Current Functional Level Cognition  Arousal/Alertness: Awake/alert Overall Cognitive Status: Impaired/Different from baseline Difficult to assess due to: Impaired communication Current Attention Level: Selective Orientation Level: Oriented X4 Following Commands: Follows one step commands consistently, Follows multi-step commands with increased time, Follows multi-step commands inconsistently Safety/Judgement: Decreased awareness of deficits General Comments: Pt continues to present with flat affect; requires increased time to initiate, sequence and complete tasks, requiring intermittent cues for sucessful task completion; questioning cues utilized to sequence through grooming tasks  Attention: Sustained Sustained Attention: Impaired Sustained Attention Impairment: Verbal basic    Extremity Assessment (includes Sensation/Coordination)  Upper Extremity Assessment: RUE deficits/detail RUE Deficits / Details: Pt with poor grasp and FM skills. With significant about of time, pt able to bring RUE to face. Poor cooridination and uses compensatory shoulder hiking RUE Coordination: decreased fine motor, decreased gross motor  Lower Extremity Assessment: Defer to PT evaluation    ADLs  Overall ADL's : Needs assistance/impaired Eating/Feeding: Moderate assistance, Sitting Grooming: Wash/dry face, Oral  care, Standing, Minimal assistance, Cueing for sequencing Grooming Details (indicate cue type and reason): MinA +2 in standing Upper Body Bathing: Moderate assistance, Sitting Lower Body Bathing: Maximal assistance, +2 for safety/equipment, Sit to/from stand Upper Body Dressing : Moderate assistance, Sitting Lower Body Dressing: Moderate assistance, Sit to/from stand Lower  Body Dressing Details (indicate cue type and reason): Pt able to don/doff socks EOB with min guard assist.  Assist to pull pants over hips in standing  Toilet Transfer: Minimal assistance, Stand-pivot, BSC Toilet Transfer Details (indicate cue type and reason): Min A +2 for stand pivot to recliner. Pt requiring significant amount if time and cues Toileting- Clothing Manipulation and Hygiene: Maximal assistance, +2 for physical assistance, +2 for safety/equipment, Bed level Toileting - Clothing Manipulation Details (indicate cue type and reason): NT/RN completing peri-care after BM at bed level at start of session  Functional mobility during ADLs: Minimal assistance, +2 for safety/equipment, +2 for physical assistance General ADL Comments: Pt requires increased time to initiate and sequence standing grooming tasks, requiring verbal/questioning cues; Pt able to use RUE to assist with task completion though with increased time/effort; Pt continues to present with decreased cognition, vision, decreased dynamic balance     Mobility  Overal bed mobility: Needs Assistance Bed Mobility: Supine to Sit Supine to sit: Min guard, HOB elevated Sit to supine: Max assist, +2 for safety/equipment General bed mobility comments: HOB slightly elevated, MinGuard for safety though no physical assist needed     Transfers  Overall transfer level: Needs assistance Equipment used: Rolling walker (2 wheeled) Transfers: Sit to/from Stand Sit to Stand: Min assist, +2 physical assistance, +2 safety/equipment Stand pivot transfers: Min assist General transfer comment: minAx2 for power up and steadying in RW, VCs for hand placement     Ambulation / Gait / Stairs / Wheelchair Mobility  Ambulation/Gait Ambulation/Gait assistance: Min assist, +2 safety/equipment Ambulation Distance (Feet): 10 Feet Assistive device: Rolling walker (2 wheeled) Gait Pattern/deviations: Step-to pattern, Wide base of support, Decreased stride  length General Gait Details: minAx2 for steadying with RW, vc for proximity to RW, and increased foot clearance Gait velocity: slowed Gait velocity interpretation: Below normal speed for age/gender    Posture / Balance Dynamic Sitting Balance Sitting balance - Comments: able to don/doff socks EOB  Balance Overall balance assessment: Needs assistance Sitting-balance support: Feet supported Sitting balance-Leahy Scale: Good Sitting balance - Comments: able to don/doff socks EOB  Postural control: Posterior lean, Right lateral lean Standing balance support: During functional activity, No upper extremity supported Standing balance-Leahy Scale: Poor Standing balance comment: required minA for standing balance during grooming tasks     Special needs/care consideration BiPAP/CPAP yes pta CPM  N/a Continuous Drip IV  N/a Dialysis  N/a Life Vest  N/a Oxygen  N/a Special Bed n/a Trach Size  N/a Wound Vac n/a Skin intact                              Bowel mgmt: incontinent due to positive cdiff/enteric precautions Bladder mgmt: incontinent; external catheter Diabetic mgmt  N/a   Previous Home Environment Living Arrangements: Spouse/significant other  Lives With: Spouse Available Help at Discharge: Family, Available 24 hours/day Type of Home: House Home Layout: One level Home Access: Stairs to enter Technical brewer of Steps: 2 Bathroom Shower/Tub: Tub/shower unit, Multimedia programmer: Standard Bathroom Accessibility: Yes How Accessible: Accessible via walker Home Care Services: No  Discharge Living Setting Plans for Discharge  Living Setting: Patient's home, Lives with (comment)(wife) Type of Home at Discharge: House Discharge Home Layout: One level Discharge Home Access: Stairs to enter Entrance Stairs-Rails: None Entrance Stairs-Number of Steps: 2 Discharge Bathroom Shower/Tub: Tub/shower unit, Walk-in shower Discharge Bathroom Toilet: Standard Discharge  Bathroom Accessibility: Yes How Accessible: Accessible via walker Does the patient have any problems obtaining your medications?: No  Social/Family/Support Systems Patient Roles: Spouse, Parent(business owner) Contact Information: Oliver Hum, wife not his parents Anticipated Caregiver: wife Anticipated Caregiver's Contact Information: [phone] Ability/Limitations of Caregiver: wife unemployed; no limitations Caregiver Availability: 24/7 Discharge Plan Discussed with Primary Caregiver: Yes Is Caregiver In Agreement with Plan?: Yes Does Caregiver/Family have Issues with Lodging/Transportation while Pt is in Rehab?: No  Goals/Additional Needs Patient/Family Goal for Rehab: supervision with PT, OT, and SLP Expected length of stay: ELOS 13 to 20 days Additional Information: Pt's wife of 7 years and pt's Mom and pt's sister are at odds with each other(Pt placed under XXX on acute so pt's family would not visit,) Pt/Family Agrees to Admission and willing to participate: Yes Program Orientation Provided & Reviewed with Pt/Caregiver Including Roles  & Responsibilities: Yes  Barriers to Discharge: (family conflict with wife and pt's parents and sister)  Decrease burden of Care through IP rehab admission: n/a  Possible need for SNF placement upon discharge:not anticipated  Patient Condition: This patient's medical and functional status has changed since the consult dated: 12/04/2017 in which the Rehabilitation Physician determined and documented that the patient's condition is appropriate for intensive rehabilitative care in an inpatient rehabilitation facility. See "History of Present Illness" (above) for medical update. Functional changes are: min to mod assist. Patient's medical and functional status update has been discussed with the Rehabilitation physician and patient remains appropriate for inpatient rehabilitation. Will admit to inpatient rehab today.  Preadmission Screen Completed By:   Cleatrice Burke, 12/10/2017 1:37 PM ______________________________________________________________________   Discussed status with Dr. Posey Pronto on 12/10/2017 at 1339 and received telephone approval for admission today.  Admission Coordinator:  Cleatrice Burke, time 3893 Date 12/10/2017

## 2017-12-07 NOTE — Progress Notes (Signed)
Patient on CPAP upon RT entering room. RT will continue to monitor.

## 2017-12-07 NOTE — Progress Notes (Signed)
Occupational Therapy Treatment Patient Details Name: William Lowery MRN: [id number] DOB: 05-26-1966 Today's Date: 12/07/2017    History of present illness 52 y.o. male with PMHx: HTN, sleep apnea, right cerebellar CVA. Admitted with L PCA, R SCA, bil PICA infarcts s/p tPA and thrombectomy with occluded stents, intubated 1/26-1/28   OT comments  Pt is making excellent progress.  He was able to don/doff socks while EOB, but requires mod A, overall for LB ADLs.  He required min A for functional transfers.  He and wife were instructed on initial HEP to increase coordination of Rt UE.  Continue to feel CIR is most appropriate discharge disposition to allow him to regain maximal independence with ADLs.   Follow Up Recommendations  CIR;Supervision/Assistance - 24 hour    Equipment Recommendations  None recommended by OT    Recommendations for Other Services Rehab consult;PT consult;Speech consult    Precautions / Restrictions Precautions Precautions: Fall       Mobility Bed Mobility Overal bed mobility: Needs Assistance Bed Mobility: Supine to Sit     Supine to sit: Min assist;HOB elevated     General bed mobility comments: assist to initiate moving LEs off bed and to lift trunk   Transfers Overall transfer level: Needs assistance Equipment used: 1 person hand held assist Transfers: Sit to/from [facility] Sit to Stand: Min assist Stand pivot transfers: Min assist       General transfer comment: assist to steady     Balance Overall balance assessment: Needs assistance Sitting-balance support: Feet supported Sitting balance-Leahy Scale: Good Sitting balance - Comments: able to don/doff socks EOB    Standing balance support: During functional activity;No upper extremity supported Standing balance-Leahy Scale: Poor Standing balance comment: requires min A to steady                            ADL either performed or assessed with clinical  judgement   ADL Overall ADL's : Needs assistance/impaired                     Lower Body Dressing: Moderate assistance;Sit to/from stand Lower Body Dressing Details (indicate cue type and reason): Pt able to don/doff socks EOB with min guard assist.  Assist to pull pants over hips in standing  Toilet Transfer: Minimal assistance;Stand-pivot;BSC   Toileting- Clothing Manipulation and Hygiene: Moderate assistance;Sit to/from stand       Functional mobility during ADLs: Minimal assistance       Vision   Vision Assessment?: Yes;Vision impaired- to be further tested in functional context Visual Fields: Right superior homonymous quadranopsia   Perception     Praxis      Cognition Arousal/Alertness: Awake/alert Behavior During Therapy: Flat affect;WFL for tasks assessed/performed Overall Cognitive Status: Impaired/Different from baseline Area of Impairment: Attention;Following commands;Safety/judgement;Awareness;Problem solving                   Current Attention Level: Selective Memory: Decreased short-term memory Following Commands: Follows one step commands consistently Safety/Judgement: Decreased awareness of deficits Awareness: Intellectual Problem Solving: Slow processing;Decreased initiation;Difficulty sequencing;Requires verbal cues;Requires tactile cues General Comments: Pt with flat affect.  He struggles with abstract concepts and humor.  He requires increased time to initiate and complete tasks.  cues provided for problem solving         Exercises Exercises: Other exercises Other Exercises Other Exercises: Pt instructed to work on tapping fingers and with turning objects in fingertips  to improve reciprocal movements of thumb and digits    Shoulder Instructions       General Comments wife and sister present     Pertinent Vitals/ Pain       Pain Assessment: No/denies pain  Home Living                           Bathroom  Accessibility: Yes How Accessible: Accessible via walker            Prior Functioning/Environment              Frequency  Min 3X/week        Progress Toward Goals  OT Goals(current goals can now be found in the care plan section)  Progress towards OT goals: Progressing toward goals     Plan Discharge plan remains appropriate    Co-evaluation                 AM-PAC PT "6 Clicks" Daily Activity     Outcome Measure   Help from another person eating meals?: A Little Help from another person taking care of personal grooming?: A Little Help from another person toileting, which includes using toliet, bedpan, or urinal?: A Lot Help from another person bathing (including washing, rinsing, drying)?: A Lot Help from another person to put on and taking off regular upper body clothing?: A Little Help from another person to put on and taking off regular lower body clothing?: A Lot 6 Click Score: 15    End of Session Equipment Utilized During Treatment: Gait belt  OT Visit Diagnosis: Unsteadiness on feet (R26.81);Other abnormalities of gait and mobility (R26.89);Muscle weakness (generalized) (M62.81);Other symptoms and signs involving cognitive function;Low vision, both eyes (H54.2);Hemiplegia and hemiparesis Hemiplegia - Right/Left: Right Hemiplegia - dominant/non-dominant: Dominant Hemiplegia - caused by: Cerebral infarction   Activity Tolerance Patient tolerated treatment well   Patient Left in chair;with call bell/phone within reach;with chair alarm set;with family/visitor present   Nurse Communication Mobility status        Time: 5003-7048 OT Time Calculation (min): 36 min  Charges: OT General Charges $OT Visit: 1 Visit OT Treatments $Self Care/Home Management : 8-22 mins $Neuromuscular Re-education: 8-22 mins  [facility], OTR/L [phone]    Lucille Passy M 12/07/2017, 4:48 PM

## 2017-12-07 NOTE — Progress Notes (Signed)
STROKE TEAM PROGRESS NOTE   SUBJECTIVE (INTERVAL HISTORY) No family is at bedside. Patient is more alert of exam today, answers questions and follows all commands. Continues to have diarrhea. CTA Chest +for thoracic aortic aneurysm. Remains afebrile and WBC trending down. Bl Cx pending.  OBJECTIVE Temp:  [97.5 F (36.4 C)-99.4 F (37.4 C)] 98 F (36.7 C) (02/01 0649) Pulse Rate:  [73-95] 73 (02/01 1030) Cardiac Rhythm: Normal sinus rhythm (02/01 0700) Resp:  [16-20] 18 (02/01 1030) BP: (125-154)/(77-97) 131/79 (02/01 1030) SpO2:  [94 %-96 %] 95 % (02/01 1030) Weight:  [136.8 kg (301 lb 9.4 oz)] 136.8 kg (301 lb 9.4 oz) (02/01 0649)  CBC:  Recent Labs  Lab 12/01/17 1550  12/03/17 0422  12/06/17 0318 12/07/17 0543  WBC 10.5   < > 12.6*   < > 16.5* 12.6*  NEUTROABS 7.0  --  10.3*  --   --   --   HGB 17.1*   < > 13.9   < > 14.4 14.2  HCT 48.8   < > 40.8   < > 42.7 41.1  MCV 88.7   < > 91.7   < > 88.6 88.8  PLT 243   < > 196   < > 231 243   < > = values in this interval not displayed.    Basic Metabolic Panel:  Recent Labs  Lab 12/03/17 1109 12/04/17 0250  12/06/17 0318 12/07/17 0543  NA  --  143   < > 142 141  K  --  3.7   < > 3.0* 3.2*  CL  --  111   < > 107 108  CO2  --  23   < > 24 22  GLUCOSE  --  121*   < > 92 84  BUN  --  14   < > 13 15  CREATININE  --  0.73   < > 0.72 0.70  CALCIUM  --  8.3*   < > 8.5* 8.1*  MG 1.9  --   --  2.0  --   PHOS 3.2 2.6  --  3.4  --    < > = values in this interval not displayed.    Lipid Panel:     Component Value Date/Time   CHOL 144 12/02/2017 0535   TRIG 246 (H) 12/02/2017 0535   TRIG 244 (H) 12/02/2017 0535   HDL 37 (L) 12/02/2017 0535   CHOLHDL 3.9 12/02/2017 0535   VLDL 49 (H) 12/02/2017 0535   LDLCALC 58 12/02/2017 0535   HgbA1c:  Lab Results  Component Value Date   HGBA1C 5.7 (H) 12/02/2017   Urine Drug Screen: No results found for: LABOPIA, COCAINSCRNUR, LABBENZ, AMPHETMU, THCU, LABBARB  Alcohol Level No  results found for: [place] I have personally reviewed the radiological images below and agree with the radiology interpretations.  Ct Angio Head W Or Wo Contrast Ct Angio Neck W Or Wo Contrast  Ct Cerebral Perfusion W Contrast 12/01/2017 IMPRESSION:  1. 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 14 cc of left PCA distribution penumbra with no infarct by CTP.  2. 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 No embolic source identified in the head or neck. No atheromatous changes or chronic stenoses.  3. 2.5 cm right thyroid nodule, recommend sonographic follow-up.   Ct Head Wo Contrast 12/01/2017 IMPRESSION:  1. Subarachnoid hyperdensity within the posterior left temporal lobe and left occipital lobe, which could be contrast staining or  petechial/subarachnoid hemorrhage.  2. No herniation, hydrocephalus or intraparenchymal hematoma.   Ct Head Code Stroke Wo Contrast 12/01/2017 IMPRESSION:  1. Possible hyperdense left P1 segment.  2. Negative for hemorrhage or visible acute infarct.  3. Small remote right inferior cerebellar infarct.   Mri and Mra Head Wo Contrast 12/03/2017 IMPRESSION: 1. Multifocal acute ischemia within the posterior circulation territories, predominantly within the left PCA distribution, including the left occipital lobe and left thalamus. Numerous smaller foci of acute ischemia throughout both cerebellar hemispheres. 2. Minimal susceptibility blooming in the left occipital lobe, likely indicating the presence of a minimal amount of petechial blood. 3. Stent within the P1 segment of the left PCA without flow related enhancement distal to the stent. The stent may be occluded. This could be further assessed with CTA of the head, if clinically warranted.   CT HEAD  1. Continued decrease in subarachnoid high-density along the posterior left cerebral hemisphere. No hydrocephalus. 2. Moderate acute left occipital infarct without detected progression. 3.  Question small right superior cerebellar infarct.  Transthoracic Echocardiogram - Left ventricle: The cavity size was normal. Wall thickness was   increased in a pattern of mild LVH. Systolic function was normal.   The estimated ejection fraction was in the range of 55% to 60%.   Wall motion was normal; there were no regional wall motion   abnormalities. Left ventricular diastolic function parameters   were normal. - Aortic valve: Moderately calcified annulus. Trileaflet;   moderately thickened leaflets. There was moderate stenosis. There   was mild regurgitation. Mean gradient (S): 22 mm Hg. Valve area   (VTI): 1.36 cm^2. Valve area (Vmax): 1.17 cm^2. Valve area   (Vmean): 1.25 cm^2. - Mitral valve: Mildly calcified annulus. Mildly thickened leaflets  Cerebral angiogram - Dr. Estanislado Pandy 12/01/2017 S/P bilateral common carotid and vertebral artery angiograms,followed  By partial revascularization of Lt PCA with x 2 passes with solitaire 96mm x 40 mm retriever device ,x 1 pass with the embotrap retriever device  And placement of intracranial stent in the Lt PCA for recurrent occlusion after retriever passes achieving a TICI 2b re pertfusion,followe by .75 mg og aggrastat IA into the basilar artery with slow revascularization of intrastent occlusion.due to acute platelet aggregation. Also loaded with brilinta 180 mg and aspirin 81 mg via orogastric tube prior to stent placement  PORTABLE CHEST 1 VIEW  12/05/2017 09:45 COMPARISON:  12/03/2017 and earlier. IMPRESSION: 1. Extubated and enteric tube removed. 2.  No acute cardiopulmonary abnormality.  Lower Venous Study 12/04/2017 at 1:21:55 PM. Final Interpretation: Right: There is no evidence of deep vein thrombosis in the lower extremity.There is no evidence of superficial venous thrombosis. No cystic structure found in the popliteal fossa. Left: There is no evidence of deep vein thrombosis in the lower extremity.There is no evidence of  superficial venous thrombosis. No cystic structure found in the popliteal fossa.  TEE IMPRESSION:   1. No LAA thrombus 2. Negative for PFO 3. 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 4. Trileaflet, but thickened aortic valve with moderate stenosis - mean gradient 29 mmHg 5. Moderate to severe LVH 6. LVEF 65-70% 7. Small late right to left shunt, suggestive of intrapulmonary or intrahepatic shunting 8. Ascending aortic aneurysm, measuring at least 4.1 cm  CT ANGIOGRAPHY CHEST WITH CONTRAST 12/06/2017 21:01 IMPRESSION: 4.3 cm ascending thoracic aortic aneurysm.  Small bilateral pleural effusions with dependent atelectasis.  PHYSICAL EXAM Vitals:  12/06/17 2145 12/07/17 0128 12/07/17 0649 12/07/17 1030  BP: (!) 146/91 (!) 154/97 125/77 131/79  Pulse: 85 84 88 73  Resp: 20   18  Temp: 99.4 F (37.4 C) 99.1 F (37.3 C) 98 F (36.7 C)   TempSrc: Axillary Axillary Axillary   SpO2: 96% 94% 96% 95%  Weight:   (!) 136.8 kg (301 lb 9.4 oz)   Height:        Temp:  [97.5 F (36.4 C)-99.4 F (37.4 C)] 98 F (36.7 C) (02/01 0649) Pulse Rate:  [73-95] 73 (02/01 1030) Resp:  [16-20] 18 (02/01 1030) BP: (125-154)/(77-97) 131/79 (02/01 1030) SpO2:  [94 %-96 %] 95 % (02/01 1030) Weight:  [136.8 kg (301 lb 9.4 oz)] 136.8 kg (301 lb 9.4 oz) (02/01 0649)  General - Well nourished, well developed, more alert on exam today.  Ophthalmologic - Fundi not visualized due to small pupils.  Cardiovascular - Regular rate and rhythm.  Neuro - awake alert, depressed mood. Eyes open and following simple commands. PERRL, right upper quadrantanopia. Eye moving both directions, no gaze preference. No facial droop or tongue deviation. LUE and LLE 5/5. RUE 4/5 proximal with pronator drift but 5/5 distal. RLE 5/5. DTR 1+ and no babinski. Sensation decreased on the right, coordination intact and gait not  tested.   ASSESSMENT/PLAN Mr. Yann Biehn is a 52 y.o. male with history of asthma, hypertension, and obstructive sleep apnea (wears C Pap) presenting with right-sided numbness. The patient received TPA on Saturday, 12/01/2017 at [place]. -> Interventional radiology.  Stroke: Left PCA infarct with P1 occlusion s/p tPA and IR with TICI 2b reperfusion and left P1 stent - possibly embolic - source unknown. Additional right SCA and b/l PICA infarcts likely due to procedure  Resultant right hemiparesthesia, right upper quadrantanopia  CT head repeat decreased subarachnoid hyperdensity, left PCA infact  MRI head - left PCA, right SCA and b/l PICA infarcts  MRA head - left PCA occlusion s/p stent, right SCA occlusion  2D Echo - EF 55-60%  LE venous doppler no DVT  Hypercoagulable work up negative  TEE - AV calcified atheroma vs vegetation   CT Chest - 4.3 cm ascending thoracic aortic aneurysm  Consider 30 day cardiac event monitor as out pt  LDL - 58  HgbA1c - 5.7  VTE prophylaxis - heparin subq Fall precautions Diet Heart Room service appropriate? Yes; Fluid consistency: Thin  No antithrombotic prior to admission, now on aspirin 81 mg daily and Brilinta.   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 Debara Pickett consider this could be the source of emboli. No loop recorder needed at this time. Pt will follow up with Dr. Debara Pickett as outpt  May consider 30 day cardiac event monitoring as outpt  C diff  c diff toxin positive  On venco po x 2 weeks per ID recs  Diarrhea improved  Leukocytosis   WBC 9.2->14.2->16.5->12.6  Likely due to C diff infection  Blood culture pending  Hypertension  Stable   started Losartan 50 mg 12/05/2017  Long-term  BP goal normotensive  Other Stroke Risk Factors  Obesity, Body mass index is 43.27 kg/m., recommend weight loss, diet and exercise as appropriate   Hx stroke/TIA - remote right PICA infarct by imaging  OSA, on  CPAP at home, RT Consulted, Incentive Spirometry  Other Active Problems  Hypokalemia - replacement in progress  CTA Chest 4.3 cm ascending thoracic aortic aneurysm       Hospital day # 6  Rosalin Hawking, MD PhD Stroke Neurology 12/07/2017 6:04 PM   To contact Stroke Continuity provider, please refer to [email or web address] After hours, contact General Neurology

## 2017-12-07 NOTE — Progress Notes (Signed)
I continue to follow pt's progress as we await his functional and medical readiness to admit to inpt rehab likely next week. I contacted his wife via phone and discussed with her that I have left two messages with her insurance agent with no call back. We can admit him to CIR without insurance, but he would be billed. Easier to get authorization prior to admit rather than after CIR admit there would be no guarantee of approval/coverage. [phone]

## 2017-12-08 LAB — CBC
HCT: 43.8 % (ref 39.0–52.0)
Hemoglobin: 15.1 g/dL (ref 13.0–17.0)
MCH: 30.6 pg (ref 26.0–34.0)
MCHC: 34.5 g/dL (ref 30.0–36.0)
MCV: 88.7 fL (ref 78.0–100.0)
PLATELETS: 266 10*3/uL (ref 150–400)
RBC: 4.94 MIL/uL (ref 4.22–5.81)
RDW: 13.3 % (ref 11.5–15.5)
WBC: 10 10*3/uL (ref 4.0–10.5)

## 2017-12-08 LAB — GLUCOSE, CAPILLARY
GLUCOSE-CAPILLARY: 92 mg/dL (ref 65–99)
Glucose-Capillary: 114 mg/dL — ABNORMAL HIGH (ref 65–99)
Glucose-Capillary: 81 mg/dL (ref 65–99)

## 2017-12-08 LAB — BASIC METABOLIC PANEL
ANION GAP: 10 (ref 5–15)
BUN: 14 mg/dL (ref 6–20)
CALCIUM: 8.3 mg/dL — AB (ref 8.9–10.3)
CO2: 22 mmol/L (ref 22–32)
Chloride: 110 mmol/L (ref 101–111)
Creatinine, Ser: 0.79 mg/dL (ref 0.61–1.24)
GFR calc Af Amer: >60 mL/min (ref >60)
GLUCOSE: 101 mg/dL — AB (ref 65–99)
POTASSIUM: 3.2 mmol/L — AB (ref 3.5–5.1)
SODIUM: 142 mmol/L (ref 135–145)

## 2017-12-08 NOTE — Plan of Care (Signed)
  Completed/Met Nutrition: Risk of aspiration will decrease 12/08/2017 0039 - Completed/Met by Mikey College, RN Dietary intake will improve 12/08/2017 0039 - Completed/Met by Mikey College, RN

## 2017-12-08 NOTE — Progress Notes (Signed)
Pt on CPAP already upon RT entering the room.  Pt wears home CPAP.

## 2017-12-08 NOTE — Progress Notes (Signed)
STROKE TEAM PROGRESS NOTE   SUBJECTIVE (INTERVAL HISTORY) Wife is at bedside. Patient is more alert of exam today, answers questions and follows all commands. Continues to have diarrhea. RUE much improved. Cardiology does not think he is loop recorder candidate, will follow him up as outpt after rehab.   OBJECTIVE Temp:  [98.3 F (36.8 C)-99 F (37.2 C)] 98.3 F (36.8 C) (02/02 5188) Pulse Rate:  [73-81] 78 (02/02 4166) Cardiac Rhythm: Normal sinus rhythm (02/02 0700) Resp:  [17-20] 20 (02/02 0633) BP: (123-139)/(79-84) 126/80 (02/02 0633) SpO2:  [94 %-97 %] 95 % (02/02 0630) Weight:  [305 lb 1.9 oz (138.4 kg)] 305 lb 1.9 oz (138.4 kg) (02/02 0500)  CBC:  Recent Labs  Lab 12/01/17 1550  12/03/17 0422  12/07/17 0543 12/08/17 0550  WBC 10.5   < > 12.6*   < > 12.6* 10.0  NEUTROABS 7.0  --  10.3*  --   --   --   HGB 17.1*   < > 13.9   < > 14.2 15.1  HCT 48.8   < > 40.8   < > 41.1 43.8  MCV 88.7   < > 91.7   < > 88.8 88.7  PLT 243   < > 196   < > 243 266   < > = values in this interval not displayed.    Basic Metabolic Panel:  Recent Labs  Lab 12/03/17 1109 12/04/17 0250  12/06/17 0318 12/07/17 0543 12/08/17 0550  NA  --  143   < > 142 141 142  K  --  3.7   < > 3.0* 3.2* 3.2*  CL  --  111   < > 107 108 110  CO2  --  23   < > 24 22 22   GLUCOSE  --  121*   < > 92 84 101*  BUN  --  14   < > 13 15 14   CREATININE  --  0.73   < > 0.72 0.70 0.79  CALCIUM  --  8.3*   < > 8.5* 8.1* 8.3*  MG 1.9  --   --  2.0  --   --   PHOS 3.2 2.6  --  3.4  --   --    < > = values in this interval not displayed.    Lipid Panel:     Component Value Date/Time   CHOL 144 12/02/2017 0535   TRIG 246 (H) 12/02/2017 0535   TRIG 244 (H) 12/02/2017 0535   HDL 37 (L) 12/02/2017 0535   CHOLHDL 3.9 12/02/2017 0535   VLDL 49 (H) 12/02/2017 0535   LDLCALC 58 12/02/2017 0535   HgbA1c:  Lab Results  Component Value Date   HGBA1C 5.7 (H) 12/02/2017   Urine Drug Screen: No results found for:  LABOPIA, COCAINSCRNUR, LABBENZ, AMPHETMU, THCU, LABBARB  Alcohol Level No results found for: [place] I have personally reviewed the radiological images below and agree with the radiology interpretations.  Ct Angio Head W Or Wo Contrast Ct Angio Neck W Or Wo Contrast  Ct Cerebral Perfusion W Contrast 12/01/2017 IMPRESSION:  1. 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 14 cc of left PCA distribution penumbra with no infarct by CTP.  2. 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 No embolic source identified in the head or neck. No atheromatous changes or chronic stenoses.  3. 2.5 cm right thyroid nodule, recommend sonographic follow-up.   Ct Head Wo Contrast 12/01/2017  IMPRESSION:  1. Subarachnoid hyperdensity within the posterior left temporal lobe and left occipital lobe, which could be contrast staining or petechial/subarachnoid hemorrhage.  2. No herniation, hydrocephalus or intraparenchymal hematoma.   Ct Head Code Stroke Wo Contrast 12/01/2017 IMPRESSION:  1. Possible hyperdense left P1 segment.  2. Negative for hemorrhage or visible acute infarct.  3. Small remote right inferior cerebellar infarct.   Mri and Mra Head Wo Contrast 12/03/2017 IMPRESSION: 1. Multifocal acute ischemia within the posterior circulation territories, predominantly within the left PCA distribution, including the left occipital lobe and left thalamus. Numerous smaller foci of acute ischemia throughout both cerebellar hemispheres. 2. Minimal susceptibility blooming in the left occipital lobe, likely indicating the presence of a minimal amount of petechial blood. 3. Stent within the P1 segment of the left PCA without flow related enhancement distal to the stent. The stent may be occluded. This could be further assessed with CTA of the head, if clinically warranted.   CT HEAD  1. Continued decrease in subarachnoid high-density along the posterior left cerebral hemisphere. No  hydrocephalus. 2. Moderate acute left occipital infarct without detected progression. 3. Question small right superior cerebellar infarct.  Transthoracic Echocardiogram - Left ventricle: The cavity size was normal. Wall thickness was   increased in a pattern of mild LVH. Systolic function was normal.   The estimated ejection fraction was in the range of 55% to 60%.   Wall motion was normal; there were no regional wall motion   abnormalities. Left ventricular diastolic function parameters   were normal. - Aortic valve: Moderately calcified annulus. Trileaflet;   moderately thickened leaflets. There was moderate stenosis. There   was mild regurgitation. Mean gradient (S): 22 mm Hg. Valve area   (VTI): 1.36 cm^2. Valve area (Vmax): 1.17 cm^2. Valve area   (Vmean): 1.25 cm^2. - Mitral valve: Mildly calcified annulus. Mildly thickened leaflets  Cerebral angiogram - Dr. Estanislado Pandy 12/01/2017 S/P bilateral common carotid and vertebral artery angiograms,followed  By partial revascularization of Lt PCA with x 2 passes with solitaire 52mm x 40 mm retriever device ,x 1 pass with the embotrap retriever device  And placement of intracranial stent in the Lt PCA for recurrent occlusion after retriever passes achieving a TICI 2b re pertfusion,followe by .75 mg og aggrastat IA into the basilar artery with slow revascularization of intrastent occlusion.due to acute platelet aggregation. Also loaded with brilinta 180 mg and aspirin 81 mg via orogastric tube prior to stent placement  PORTABLE CHEST 1 VIEW  12/05/2017 09:45 COMPARISON:  12/03/2017 and earlier. IMPRESSION: 1. Extubated and enteric tube removed. 2.  No acute cardiopulmonary abnormality.  Lower Venous Study 12/04/2017 at 1:21:55 PM. Final Interpretation: Right: There is no evidence of deep vein thrombosis in the lower extremity.There is no evidence of superficial venous thrombosis. No cystic structure found in the popliteal fossa. Left: There  is no evidence of deep vein thrombosis in the lower extremity.There is no evidence of superficial venous thrombosis. No cystic structure found in the popliteal fossa.  TEE IMPRESSION:   1. No LAA thrombus 2. Negative for PFO 3. 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 4. Trileaflet, but thickened aortic valve with moderate stenosis - mean gradient 29 mmHg 5. Moderate to severe LVH 6. LVEF 65-70% 7. Small late right to left shunt, suggestive of intrapulmonary or intrahepatic shunting 8. Ascending aortic aneurysm, measuring at least 4.1 cm  CT ANGIOGRAPHY CHEST WITH CONTRAST 12/06/2017  21:01 IMPRESSION: 4.3 cm ascending thoracic aortic aneurysm.  Small bilateral pleural effusions with dependent atelectasis.  PHYSICAL EXAM Vitals:   12/07/17 2200 12/08/17 0225 12/08/17 0500 12/08/17 0633  BP: 136/84 123/81  126/80  Pulse: 81 77  78  Resp: 20 20  20   Temp: 99 F (37.2 C) 98.4 F (36.9 C)  98.3 F (36.8 C)  TempSrc: Oral Oral  Axillary  SpO2: 95% 94%  95%  Weight:   (!) 305 lb 1.9 oz (138.4 kg)   Height:        Temp:  [98.3 F (36.8 C)-99 F (37.2 C)] 98.3 F (36.8 C) (02/02 7510) Pulse Rate:  [73-81] 78 (02/02 0633) Resp:  [17-20] 20 (02/02 2585) BP: (123-139)/(79-84) 126/80 (02/02 0633) SpO2:  [94 %-97 %] 95 % (02/02 2778) Weight:  [305 lb 1.9 oz (138.4 kg)] 305 lb 1.9 oz (138.4 kg) (02/02 0500)  General - Well nourished, well developed, more alert on exam today.  Ophthalmologic - Fundi not visualized due to small pupils.  Cardiovascular - Regular rate and rhythm.  Neuro - awake alert, depressed mood. Eyes open and following simple commands. PERRL, right upper quadrantanopia. Eye moving both directions, no gaze preference. No facial droop or tongue deviation. LUE and LLE 5/5. RUE 4/5 proximal with pronator drift but 5/5 distal. RLE 5/5. DTR 1+ and no babinski. Sensation decreased on the  right, coordination intact and gait not tested.   ASSESSMENT/PLAN [name] is a 52 y.o. male with history of asthma, hypertension, and obstructive sleep apnea (wears C Pap) presenting with right-sided numbness. The patient received TPA on Saturday, 12/01/2017 at [facility]. -> Interventional radiology.  Stroke: Left PCA infarct with P1 occlusion s/p tPA and IR with TICI 2b reperfusion and left P1 stent - possibly embolic - source unknown. Additional right SCA and b/l PICA infarcts likely due to procedure  Resultant right hemiparesthesia, right upper quadrantanopia  CT head repeat decreased subarachnoid hyperdensity, left PCA infact  MRI head - left PCA, right SCA and b/l PICA infarcts  MRA head - left PCA occlusion s/p stent, right SCA occlusion  2D Echo - EF 55-60%  LE venous doppler no DVT  Hypercoagulable work up negative  TEE - AV calcified atheroma vs vegetation   CT Chest - 4.3 cm ascending thoracic aortic aneurysm  Consider 30 day cardiac event monitor as out pt  LDL - 58  HgbA1c - 5.7  VTE prophylaxis - heparin subq Fall precautions Diet Heart Room service appropriate? Yes; Fluid consistency: Thin  No antithrombotic prior to admission, now on aspirin 81 mg daily and Brilinta.   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 Debara Pickett consider this could be the source of emboli. No loop recorder needed at this time. Pt will follow up with Dr. Debara Pickett as outpt  May consider 30 day cardiac event monitoring as outpt  C diff  c diff toxin positive  On venco po x 2 weeks per ID recs  Diarrhea improved  Leukocytosis   WBC 9.2->14.2->16.5->12.6  Likely due to C diff infection  Blood culture pending  Hypertension  Stable    started Losartan 50 mg 12/05/2017  Long-term BP goal normotensive  Other Stroke Risk Factors  Obesity, Body mass index is 43.78 kg/m., recommend weight  loss, diet and exercise as appropriate   Hx stroke/TIA - remote right PICA infarct by imaging  OSA, on CPAP at home, RT Consulted, Incentive Spirometry  Other Active Problems  Hypokalemia - replacement in progress  CTA Chest 4.3 cm ascending thoracic aortic aneurysm       Hospital day # 7  William Hawking, MD PhD Stroke Neurology 12/09/2017 12:20 AM    To contact Stroke Continuity provider, please refer to [email or web address] After hours, contact General Neurology

## 2017-12-09 ENCOUNTER — Other Ambulatory Visit: Payer: Self-pay

## 2017-12-09 DIAGNOSIS — H539 Unspecified visual disturbance: Secondary | ICD-10-CM

## 2017-12-09 DIAGNOSIS — I728 Aneurysm of other specified arteries: Secondary | ICD-10-CM

## 2017-12-09 DIAGNOSIS — A0472 Enterocolitis due to Clostridium difficile, not specified as recurrent: Secondary | ICD-10-CM

## 2017-12-09 DIAGNOSIS — F17220 Nicotine dependence, chewing tobacco, uncomplicated: Secondary | ICD-10-CM

## 2017-12-09 LAB — GLUCOSE, CAPILLARY
GLUCOSE-CAPILLARY: 87 mg/dL (ref 65–99)
Glucose-Capillary: 96 mg/dL (ref 65–99)
Glucose-Capillary: 135 mg/dL — ABNORMAL HIGH (ref 65–99)
Glucose-Capillary: 189 mg/dL — ABNORMAL HIGH (ref 65–99)

## 2017-12-09 MED ORDER — ZINC OXIDE 12.8 % EX OINT
TOPICAL_OINTMENT | CUTANEOUS | Status: DC | PRN
  Administered 2017-12-10: 1 via TOPICAL
  Filled 2017-12-09: qty 56.7

## 2017-12-09 NOTE — Progress Notes (Signed)
Pt wear his home CPAP, no help needed from RT.

## 2017-12-09 NOTE — Progress Notes (Signed)
STROKE TEAM PROGRESS NOTE   SUBJECTIVE (INTERVAL HISTORY) No family is at bedside. Patient is awake alert, RUE much improved and now near normal strength. Diarrhea as per pt getting better, but as per RN, not getting better, will have ID consult. Needs PT/OT re-evaluation for rehab needs.   OBJECTIVE Temp:  [98.3 F (36.8 C)-99 F (37.2 C)] 98.8 F (37.1 C) (02/03 1441) Pulse Rate:  [80-86] 80 (02/03 1441) Cardiac Rhythm: Normal sinus rhythm (02/03 0700) Resp:  [18-19] 19 (02/03 1441) BP: (125-133)/(71-82) 125/80 (02/03 1441) SpO2:  [95 %-99 %] 95 % (02/03 1441) Weight:  [304 lb 10.8 oz (138.2 kg)] 304 lb 10.8 oz (138.2 kg) (02/03 0624)  CBC:  Recent Labs  Lab 12/03/17 0422  12/07/17 0543 12/08/17 0550  WBC 12.6*   < > 12.6* 10.0  NEUTROABS 10.3*  --   --   --   HGB 13.9   < > 14.2 15.1  HCT 40.8   < > 41.1 43.8  MCV 91.7   < > 88.8 88.7  PLT 196   < > 243 266   < > = values in this interval not displayed.    Basic Metabolic Panel:  Recent Labs  Lab 12/03/17 1109 12/04/17 0250  12/06/17 0318 12/07/17 0543 12/08/17 0550  NA  --  143   < > 142 141 142  K  --  3.7   < > 3.0* 3.2* 3.2*  CL  --  111   < > 107 108 110  CO2  --  23   < > 24 22 22   GLUCOSE  --  121*   < > 92 84 101*  BUN  --  14   < > 13 15 14   CREATININE  --  0.73   < > 0.72 0.70 0.79  CALCIUM  --  8.3*   < > 8.5* 8.1* 8.3*  MG 1.9  --   --  2.0  --   --   PHOS 3.2 2.6  --  3.4  --   --    < > = values in this interval not displayed.    Lipid Panel:     Component Value Date/Time   CHOL 144 12/02/2017 0535   TRIG 246 (H) 12/02/2017 0535   TRIG 244 (H) 12/02/2017 0535   HDL 37 (L) 12/02/2017 0535   CHOLHDL 3.9 12/02/2017 0535   VLDL 49 (H) 12/02/2017 0535   LDLCALC 58 12/02/2017 0535   HgbA1c:  Lab Results  Component Value Date   HGBA1C 5.7 (H) 12/02/2017   Urine Drug Screen: No results found for: LABOPIA, COCAINSCRNUR, LABBENZ, AMPHETMU, THCU, LABBARB  Alcohol Level No results found for:  [place] I have personally reviewed the radiological images below and agree with the radiology interpretations.  Ct Angio Head W Or Wo Contrast Ct Angio Neck W Or Wo Contrast  Ct Cerebral Perfusion W Contrast 12/01/2017 IMPRESSION:  1. 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 14 cc of left PCA distribution penumbra with no infarct by CTP.  2. 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 No embolic source identified in the head or neck. No atheromatous changes or chronic stenoses.  3. 2.5 cm right thyroid nodule, recommend sonographic follow-up.   Ct Head Wo Contrast 12/01/2017 IMPRESSION:  1. Subarachnoid hyperdensity within the posterior left temporal lobe and left occipital lobe, which could be contrast staining or petechial/subarachnoid hemorrhage.  2. No herniation, hydrocephalus or intraparenchymal hematoma.  Ct Head Code Stroke Wo Contrast 12/01/2017 IMPRESSION:  1. Possible hyperdense left P1 segment.  2. Negative for hemorrhage or visible acute infarct.  3. Small remote right inferior cerebellar infarct.   Mri and Mra Head Wo Contrast 12/03/2017 IMPRESSION: 1. Multifocal acute ischemia within the posterior circulation territories, predominantly within the left PCA distribution, including the left occipital lobe and left thalamus. Numerous smaller foci of acute ischemia throughout both cerebellar hemispheres. 2. Minimal susceptibility blooming in the left occipital lobe, likely indicating the presence of a minimal amount of petechial blood. 3. Stent within the P1 segment of the left PCA without flow related enhancement distal to the stent. The stent may be occluded. This could be further assessed with CTA of the head, if clinically warranted.   CT HEAD  1. Continued decrease in subarachnoid high-density along the posterior left cerebral hemisphere. No hydrocephalus. 2. Moderate acute left occipital infarct without detected progression. 3. Question small  right superior cerebellar infarct.  Transthoracic Echocardiogram - Left ventricle: The cavity size was normal. Wall thickness was   increased in a pattern of mild LVH. Systolic function was normal.   The estimated ejection fraction was in the range of 55% to 60%.   Wall motion was normal; there were no regional wall motion   abnormalities. Left ventricular diastolic function parameters   were normal. - Aortic valve: Moderately calcified annulus. Trileaflet;   moderately thickened leaflets. There was moderate stenosis. There   was mild regurgitation. Mean gradient (S): 22 mm Hg. Valve area   (VTI): 1.36 cm^2. Valve area (Vmax): 1.17 cm^2. Valve area   (Vmean): 1.25 cm^2. - Mitral valve: Mildly calcified annulus. Mildly thickened leaflets  Cerebral angiogram - Dr. Estanislado Pandy 12/01/2017 S/P bilateral common carotid and vertebral artery angiograms,followed  By partial revascularization of Lt PCA with x 2 passes with solitaire 110mm x 40 mm retriever device ,x 1 pass with the embotrap retriever device  And placement of intracranial stent in the Lt PCA for recurrent occlusion after retriever passes achieving a TICI 2b re pertfusion,followe by .75 mg og aggrastat IA into the basilar artery with slow revascularization of intrastent occlusion.due to acute platelet aggregation. Also loaded with brilinta 180 mg and aspirin 81 mg via orogastric tube prior to stent placement  PORTABLE CHEST 1 VIEW   12/05/2017 COMPARISON:  12/03/2017 and earlier. IMPRESSION: 1. Extubated and enteric tube removed. 2.  No acute cardiopulmonary abnormality.  Lower Venous Study 12/04/2017 at 1:21:55 PM. Final Interpretation: Right: There is no evidence of deep vein thrombosis in the lower extremity.There is no evidence of superficial venous thrombosis. No cystic structure found in the popliteal fossa. Left: There is no evidence of deep vein thrombosis in the lower extremity.There is no evidence of superficial venous  thrombosis. No cystic structure found in the popliteal fossa.  TEE IMPRESSION:   1. No LAA thrombus 2. Negative for PFO 3. 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 4. Trileaflet, but thickened aortic valve with moderate stenosis - mean gradient 29 mmHg 5. Moderate to severe LVH 6. LVEF 65-70% 7. Small late right to left shunt, suggestive of intrapulmonary or intrahepatic shunting 8. Ascending aortic aneurysm, measuring at least 4.1 cm  CT ANGIOGRAPHY CHEST WITH CONTRAST 12/06/2017 21:01 IMPRESSION: 4.3 cm ascending thoracic aortic aneurysm.  Recommend annual imaging followup by CTA or MRA. Small bilateral pleural effusions with dependent atelectasis.   PHYSICAL EXAM Vitals:   12/08/17 2221  12/09/17 0144 12/09/17 0624 12/09/17 1441  BP: 133/71 127/82 125/76 125/80  Pulse: 84 86 80 80  Resp: 18 18  19   Temp: 98.5 F (36.9 C) 99 F (37.2 C) 98.3 F (36.8 C) 98.8 F (37.1 C)  TempSrc: Axillary Axillary Axillary Oral  SpO2: 99% 99% 96% 95%  Weight:   (!) 304 lb 10.8 oz (138.2 kg)   Height:        Temp:  [98.3 F (36.8 C)-99 F (37.2 C)] 98.8 F (37.1 C) (02/03 1441) Pulse Rate:  [80-86] 80 (02/03 1441) Resp:  [18-19] 19 (02/03 1441) BP: (125-133)/(71-82) 125/80 (02/03 1441) SpO2:  [95 %-99 %] 95 % (02/03 1441) Weight:  [304 lb 10.8 oz (138.2 kg)] 304 lb 10.8 oz (138.2 kg) (02/03 0624)  General - Well nourished, well developed, more alert on exam today.  Ophthalmologic - Fundi not visualized due to small pupils.  Cardiovascular - Regular rate and rhythm.  Neuro - awake alert, depressed mood. Eyes open and following simple commands. PERRL, right upper quadrantanopia. Eye moving both directions, no gaze preference. No facial droop or tongue deviation. LUE and LLE 5/5. RUE 5-/5 proximal with mild pronator drift but 5/5 distal. RLE 5/5. DTR 1+ and no babinski. Sensation decreased on the  right, coordination intact and gait not tested.   ASSESSMENT/PLAN [name] is a 52 y.o. male with history of asthma, hypertension, and obstructive sleep apnea (wears C Pap) presenting with right-sided numbness. The patient received TPA on Saturday, 12/01/2017 at [facility]. -> Interventional radiology.  Stroke: Left PCA infarct with P1 occlusion s/p tPA and IR with TICI 2b reperfusion and left P1 stent - possibly embolic - source unknown. Additional right SCA and b/l PICA infarcts likely due to procedure  Resultant right hemiparesthesia, right upper quadrantanopia  CT head repeat decreased subarachnoid hyperdensity, left PCA infact  MRI head - left PCA, right SCA and b/l PICA infarcts  MRA head - left PCA occlusion s/p stent, right SCA occlusion  2D Echo - EF 55-60%  LE venous doppler no DVT  Hypercoagulable work up negative  TEE - AV calcified atheroma vs vegetation   CT Chest - 4.3 cm ascending thoracic aortic aneurysm  Consider 30 day cardiac event monitor as out pt  LDL - 58  HgbA1c - 5.7  VTE prophylaxis - heparin subq Fall precautions Diet Heart Room service appropriate? Yes; Fluid consistency: Thin  No antithrombotic prior to admission, now on aspirin 81 mg daily and Brilinta.   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 Debara Pickett consider this could be the source of emboli. No loop recorder needed at this time. Pt will follow up with Dr. Debara Pickett as outpt  May consider 30 day cardiac event monitoring as outpt  C diff  C diff toxin positive  PO venco per ID recs  PO Vancomycin started 12/06/2017 X 14 days   ID consult - continue vanco for 2 weeks, bowel supplement with yogurt  Leukocytosis   WBC  9.2->14.2->16.5->12.6->10.0  Likely due to C diff infection  Blood culture NGTD  Hypertension  Stable   Started Losartan 50 mg 12/05/2017 -> BP better  Long-term BP goal normotensive  Other Stroke Risk Factors  Obesity, Body mass index is 43.72 kg/m.,  recommend weight loss, diet and exercise as appropriate   Hx stroke/TIA - remote right PICA infarct by imaging  OSA, on CPAP at home, RT Consulted, Incentive Spirometry  Other Active Problems  Hypokalemia - replacement in progress - 3.2 - check in AM  CTA Chest 4.3 cm ascending thoracic aortic aneurysm  Thyroid nodule - F/U recommended        Hospital day # 8  Rosalin Hawking, MD PhD Stroke Neurology 12/09/2017 4:02 PM    To contact Stroke Continuity provider, please refer to [email or web address] After hours, contact General Neurology

## 2017-12-09 NOTE — Consult Note (Signed)
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 Continue po vanco for 2 weeks 2. F/u his BCx done on 2-1. Not clear this is infectious endocarditis causing this event.  3. He will get 30 day f/u echo.  4. Bowel supplements (yogurt) 5. Available as needed  Comment- He is improving- BM down to 3-4/day. Went 5 h yesterday without BM.  He may have loose BM for some time while his colon reconstitutes. This is not an indication to re-test (please don't). He will carry the spores/eggs for some time.  I advised his family of hand washing with soap and water, not alcohol gels.  I explained all this pt and his family.    Thank you so much for this interesting consult,  Active Problems:   Acute CVA (cerebrovascular accident) ([place])   Acute arterial ischemic stroke, multifocal, posterior circulation ([place])   . aspirin EC  81 mg Oral Daily  . atorvastatin  10 mg Oral q1800  . feeding supplement (ENSURE ENLIVE)  237 mL Oral BID BM  . lactobacillus acidophilus  2 tablet Oral TID  . losartan  50 mg Oral Daily  . potassium chloride  40 mEq Oral TID  . ticagrelor  90 mg Oral BID  . vancomycin  125 mg Oral QID    HPI: William Lowery is a 52 y.o. male with hx of adm on 1-26 with numbness to R face. His Ct showed occlusion Left PCA. He was given TPA and had stent placed. He required intubation and pressor support afterwards. He was continued on ASA and brillanta.  He developed loose BM and tested positive for C diff on 1-31. He had WBC 16.5 and was normotensive.  His Tmax prior was 100 (1-30), 100.4 (1-28).  He was started on IV flagyl by neuro.  His TEE showed a question of a atheroma or vegetation on RCC, mod AS, and either  intrapulmonary or intrahepatic shunt. 4.1 Cm Ao aneurysm.   His WBC has decreased, he continues to have loose BM . Now down to 3-4/day. He has been afebrile, his WBC has improved.   Review of Systems: Review of Systems  Constitutional: Negative for chills and fever.  HENT: Negative for sore throat.   Eyes:       +visual change.   Respiratory: Negative for shortness of breath.   Gastrointestinal: Positive for diarrhea. Negative for abdominal pain.  Genitourinary: Negative for dysuria.  +RUE weakness Please see HPI. All other systems reviewed and negative.   Past Medical History:  Diagnosis Date  . Asthma    hx of  . Heart murmur    as an infant  . Hypertension   . Sleep apnea    wears CPAP    Social History   Tobacco Use  . Smoking status: Never Smoker  . Smokeless tobacco: Current User    Types: Snuff  Substance Use Topics  . Alcohol use: No    Alcohol/week: 0.0 oz  . Drug use: No    Family History  Problem Relation Age of Onset  . Colon cancer Neg  Hx   . Esophageal cancer Neg Hx   . Rectal cancer Neg Hx   . Stomach cancer Neg Hx      Medications:  Scheduled: . aspirin EC  81 mg Oral Daily  . atorvastatin  10 mg Oral q1800  . feeding supplement (ENSURE ENLIVE)  237 mL Oral BID BM  . lactobacillus acidophilus  2 tablet Oral TID  . losartan  50 mg Oral Daily  . potassium chloride  40 mEq Oral TID  . ticagrelor  90 mg Oral BID  . vancomycin  125 mg Oral QID    Abtx:  Anti-infectives (From admission, onward)   Start     Dose/Rate Route Frequency Ordered Stop   12/06/17 1400  vancomycin (VANCOCIN) 50 mg/mL oral solution 125 mg     125 mg Oral 4 times daily 12/06/17 1241 12/20/17 1359   12/06/17 0600  metroNIDAZOLE (FLAGYL) IVPB 500 mg  Status:  Discontinued     500 mg 100 mL/hr over 60 Minutes Intravenous Every 8 hours 12/06/17 0531 12/06/17 1241   12/01/17 1725  ceFAZolin (ANCEF) 2-4 GM/100ML-% IVPB    Comments:  Margaretmary Dys   : cabinet override       12/01/17 1725 12/02/17 0544        OBJECTIVE: Blood pressure 125/80, pulse 80, temperature 98.8 F (37.1 C), temperature source Oral, resp. rate 19, height _0  (1.778 m), weight (!) 138.2 kg (304 lb 10.8 oz), SpO2 95 %.  Physical Exam  Constitutional: He is well-developed, well-nourished, and in no distress.  HENT:  Mouth/Throat: No oropharyngeal exudate.  Eyes: EOM are normal. Pupils are equal, round, and reactive to light.  Neck: Neck supple.  Cardiovascular: Normal rate, regular rhythm and normal heart sounds.  Pulmonary/Chest: Effort normal and breath sounds normal.  Abdominal: Soft. Bowel sounds are normal. There is no tenderness. There is no rebound.  Musculoskeletal: He exhibits edema.  Lymphadenopathy:    He has no cervical adenopathy.  Psychiatric: Mood normal.    Lab Results Results for orders placed or performed during the hospital encounter of 12/01/17 (from the past 48 hour(s))  Glucose, capillary     Status: Abnormal   Collection Time: 12/07/17  4:47 PM  Result Value Ref Range   Glucose-Capillary 110 (H) 65 - 99 mg/dL   Comment 1 Notify RN    Comment 2 Document in Chart   Glucose, capillary     Status: None   Collection Time: 12/07/17 10:02 PM  Result Value Ref Range   Glucose-Capillary 70 65 - 99 mg/dL  CBC     Status: None   Collection Time: 12/08/17  5:50 AM  Result Value Ref Range   WBC 10.0 4.0 - 10.5 K/uL   RBC 4.94 4.22 - 5.81 MIL/uL   Hemoglobin 15.1 13.0 - 17.0 g/dL   HCT 43.8 39.0 - 52.0 %   MCV 88.7 78.0 - 100.0 fL   MCH 30.6 26.0 - 34.0 pg   MCHC 34.5 30.0 - 36.0 g/dL   RDW 13.3 11.5 - 15.5 %   Platelets 266 150 - 400 K/uL    Comment: Performed at [facility], [place] [address] [place], [place] [address]  Basic metabolic panel     Status: Abnormal   Collection Time: 12/08/17  5:50 AM  Result Value Ref Range   Sodium 142 135 - 145 mmol/L   Potassium 3.2 (L) 3.5 - 5.1 mmol/L   Chloride 110 101 - 111 mmol/L   CO2  22 22 - 32  mmol/L   Glucose, Bld 101 (H) 65 - 99 mg/dL   BUN 14 6 - 20 mg/dL   Creatinine, Ser 0.79 0.61 - 1.24 mg/dL   Calcium 8.3 (L) 8.9 - 10.3 mg/dL   GFR calc non Af Amer >60 >60 mL/min   GFR calc Af Amer >60 >60 mL/min    Comment: (NOTE) The eGFR has been calculated using the CKD EPI equation. This calculation has not been validated in all clinical situations. eGFR's persistently <60 mL/min signify possible Chronic Kidney Disease.    Anion gap 10 5 - 15    Comment: Performed at [facility] [address], [place], [place] [address]  Glucose, capillary     Status: None   Collection Time: 12/08/17  6:31 AM  Result Value Ref Range   Glucose-Capillary 92 65 - 99 mg/dL  Glucose, capillary     Status: Abnormal   Collection Time: 12/08/17 11:19 AM  Result Value Ref Range   Glucose-Capillary 114 (H) 65 - 99 mg/dL  Glucose, capillary     Status: None   Collection Time: 12/08/17 10:18 PM  Result Value Ref Range   Glucose-Capillary 81 65 - 99 mg/dL  Glucose, capillary     Status: None   Collection Time: 12/09/17  6:23 AM  Result Value Ref Range   Glucose-Capillary 96 65 - 99 mg/dL  Glucose, capillary     Status: None   Collection Time: 12/09/17 11:40 AM  Result Value Ref Range   Glucose-Capillary 87 65 - 99 mg/dL      Component Value Date/Time   SDES BLOOD RIGHT HAND 12/07/2017 0955   SPECREQUEST  12/07/2017 0955    BOTTLES DRAWN AEROBIC ONLY Blood Culture adequate volume   CULT  12/07/2017 0955    NO GROWTH < 24 HOURS Performed at [facility], [place] [address] [place], [place] [address]    REPTSTATUS PENDING 12/07/2017 0955   No results found. Recent Results (from the past 240 hour(s))  MRSA PCR Screening     Status: None   Collection Time: 12/01/17 10:20 PM  Result Value Ref Range Status   MRSA by PCR NEGATIVE NEGATIVE Final    Comment:        The GeneXpert MRSA Assay (FDA approved for NASAL specimens only), is one component of a comprehensive MRSA  colonization surveillance program. It is not intended to diagnose MRSA infection nor to guide or monitor treatment for MRSA infections.   C difficile quick scan w PCR reflex     Status: Abnormal   Collection Time: 12/05/17 10:23 PM  Result Value Ref Range Status   C Diff antigen POSITIVE (A) NEGATIVE Final   C Diff toxin POSITIVE (A) NEGATIVE Final   C Diff interpretation Toxin producing C. difficile detected.  Final    Comment: CRITICAL RESULT CALLED TO, READ BACK BY AND VERIFIED WITH:  K BLOODWORTH RN 12/06/17 0439 JDW   Culture, blood (routine x 2)     Status: None (Preliminary result)   Collection Time: 12/07/17  9:41 AM  Result Value Ref Range Status   Specimen Description BLOOD RIGHT HAND  Final   Special Requests   Final    BOTTLES DRAWN AEROBIC AND ANAEROBIC Blood Culture results may not be optimal due to an inadequate volume of blood received in culture bottles   Culture   Final    NO GROWTH < 24 HOURS Performed at [facility], [place]  [address], [place], [place] [address]    Report Status PENDING  Incomplete  Culture, blood (routine x 2)     Status: None (Preliminary result)   Collection Time: 12/07/17  9:55 AM  Result Value Ref Range Status   Specimen Description BLOOD RIGHT HAND  Final   Special Requests   Final    BOTTLES DRAWN AEROBIC ONLY Blood Culture adequate volume   Culture   Final    NO GROWTH < 24 HOURS Performed at [facility], [place] [address] [place], [place] [address]    Report Status PENDING  Incomplete    Microbiology: Recent Results (from the past 240 hour(s))  MRSA PCR Screening     Status: None   Collection Time: 12/01/17 10:20 PM  Result Value Ref Range Status   MRSA by PCR NEGATIVE NEGATIVE Final    Comment:        The GeneXpert MRSA Assay (FDA approved for NASAL specimens only), is one component of a comprehensive MRSA colonization surveillance program. It is not intended to diagnose MRSA infection nor to guide or monitor  treatment for MRSA infections.   C difficile quick scan w PCR reflex     Status: Abnormal   Collection Time: 12/05/17 10:23 PM  Result Value Ref Range Status   C Diff antigen POSITIVE (A) NEGATIVE Final   C Diff toxin POSITIVE (A) NEGATIVE Final   C Diff interpretation Toxin producing C. difficile detected.  Final    Comment: CRITICAL RESULT CALLED TO, READ BACK BY AND VERIFIED WITH:  K BLOODWORTH RN 12/06/17 0439 JDW   Culture, blood (routine x 2)     Status: None (Preliminary result)   Collection Time: 12/07/17  9:41 AM  Result Value Ref Range Status   Specimen Description BLOOD RIGHT HAND  Final   Special Requests   Final    BOTTLES DRAWN AEROBIC AND ANAEROBIC Blood Culture results may not be optimal due to an inadequate volume of blood received in culture bottles   Culture   Final    NO GROWTH < 24 HOURS Performed at [facility], [place] [address], [place], [place] [address]    Report Status PENDING  Incomplete  Culture, blood (routine x 2)     Status: None (Preliminary result)   Collection Time: 12/07/17  9:55 AM  Result Value Ref Range Status   Specimen Description BLOOD RIGHT HAND  Final   Special Requests   Final    BOTTLES DRAWN AEROBIC ONLY Blood Culture adequate volume   Culture   Final    NO GROWTH < 24 HOURS Performed at [facility], [place] [address] [place],  [address]    Report Status PENDING  Incomplete    Radiographs and labs were personally reviewed by me.   Bobby Rumpf, MD [facility] [phone] 12/09/2017, 2:50 PM

## 2017-12-10 ENCOUNTER — Inpatient Hospital Stay (HOSPITAL_COMMUNITY)
Admission: RE | Admit: 2017-12-10 | Discharge: 2017-12-19 | DRG: 092 | Disposition: A | Discharge status: Home / Self Care | Payer: PRIVATE HEALTH INSURANCE | Source: Intra-hospital | Attending: Physical Medicine & Rehabilitation | Admitting: Physical Medicine & Rehabilitation

## 2017-12-10 DIAGNOSIS — E46 Unspecified protein-calorie malnutrition: Secondary | ICD-10-CM

## 2017-12-10 DIAGNOSIS — E876 Hypokalemia: Secondary | ICD-10-CM | POA: Diagnosis present

## 2017-12-10 DIAGNOSIS — G4733 Obstructive sleep apnea (adult) (pediatric): Secondary | ICD-10-CM | POA: Diagnosis present

## 2017-12-10 DIAGNOSIS — I712 Thoracic aortic aneurysm, without rupture: Secondary | ICD-10-CM | POA: Diagnosis present

## 2017-12-10 DIAGNOSIS — Z720 Tobacco use: Secondary | ICD-10-CM

## 2017-12-10 DIAGNOSIS — Z79899 Other long term (current) drug therapy: Secondary | ICD-10-CM

## 2017-12-10 DIAGNOSIS — Z6841 Body Mass Index (BMI) 40.0 and over, adult: Secondary | ICD-10-CM

## 2017-12-10 DIAGNOSIS — I69351 Hemiplegia and hemiparesis following cerebral infarction affecting right dominant side: Secondary | ICD-10-CM

## 2017-12-10 DIAGNOSIS — I10 Essential (primary) hypertension: Secondary | ICD-10-CM

## 2017-12-10 DIAGNOSIS — I63332 Cerebral infarction due to thrombosis of left posterior cerebral artery: Secondary | ICD-10-CM

## 2017-12-10 DIAGNOSIS — H539 Unspecified visual disturbance: Secondary | ICD-10-CM | POA: Diagnosis present

## 2017-12-10 DIAGNOSIS — E8809 Other disorders of plasma-protein metabolism, not elsewhere classified: Secondary | ICD-10-CM | POA: Diagnosis present

## 2017-12-10 DIAGNOSIS — R2689 Other abnormalities of gait and mobility: Principal | ICD-10-CM | POA: Diagnosis present

## 2017-12-10 DIAGNOSIS — I6329 Cerebral infarction due to unspecified occlusion or stenosis of other precerebral arteries: Secondary | ICD-10-CM | POA: Diagnosis present

## 2017-12-10 DIAGNOSIS — A0472 Enterocolitis due to Clostridium difficile, not specified as recurrent: Secondary | ICD-10-CM | POA: Diagnosis present

## 2017-12-10 DIAGNOSIS — I69398 Other sequelae of cerebral infarction: Secondary | ICD-10-CM

## 2017-12-10 LAB — BASIC METABOLIC PANEL
Anion gap: 10 (ref 5–15)
BUN: 11 mg/dL (ref 6–20)
CO2: 22 mmol/L (ref 22–32)
Calcium: 8.5 mg/dL — ABNORMAL LOW (ref 8.9–10.3)
Chloride: 107 mmol/L (ref 101–111)
Creatinine, Ser: 0.76 mg/dL (ref 0.61–1.24)
GFR calc Af Amer: >60 mL/min (ref >60)
GLUCOSE: 84 mg/dL (ref 65–99)
POTASSIUM: 3.6 mmol/L (ref 3.5–5.1)
Sodium: 139 mmol/L (ref 135–145)

## 2017-12-10 LAB — GLUCOSE, CAPILLARY
GLUCOSE-CAPILLARY: 106 mg/dL — AB (ref 65–99)
Glucose-Capillary: 112 mg/dL — ABNORMAL HIGH (ref 65–99)
Glucose-Capillary: 119 mg/dL — ABNORMAL HIGH (ref 65–99)
Glucose-Capillary: 106 mg/dL — ABNORMAL HIGH (ref 65–99)

## 2017-12-10 MED ORDER — ENSURE ENLIVE PO LIQD
237.0000 mL | Freq: Two times a day (BID) | ORAL | Status: DC
Start: 2017-12-11 — End: 2017-12-19
  Administered 2017-12-11 – 2017-12-18 (×14): 237 mL via ORAL

## 2017-12-10 MED ORDER — ZINC OXIDE 12.8 % EX OINT: TOPICAL_OINTMENT | CUTANEOUS | Status: DC | PRN

## 2017-12-10 MED ORDER — ONDANSETRON HCL 4 MG PO TABS: 4.0000 mg | ORAL_TABLET | Freq: Four times a day (QID) | ORAL | Status: DC | PRN

## 2017-12-10 MED ORDER — ONDANSETRON HCL 4 MG/2ML IJ SOLN: 4.0000 mg | Freq: Four times a day (QID) | INTRAMUSCULAR | Status: DC | PRN

## 2017-12-10 MED ORDER — ASPIRIN EC 81 MG PO TBEC
81.0000 mg | DELAYED_RELEASE_TABLET | Freq: Every day | ORAL | Status: DC
  Administered 2017-12-11 – 2017-12-19 (×9): 81 mg via ORAL
  Filled 2017-12-10 (×9): qty 1

## 2017-12-10 MED ORDER — ATORVASTATIN CALCIUM 10 MG PO TABS
10.0000 mg | ORAL_TABLET | Freq: Every day | ORAL | Status: DC
  Administered 2017-12-11 – 2017-12-18 (×8): 10 mg via ORAL
  Filled 2017-12-10 (×8): qty 1

## 2017-12-10 MED ORDER — ACETAMINOPHEN 325 MG PO TABS: 650.0000 mg | ORAL_TABLET | ORAL | Status: DC | PRN

## 2017-12-10 MED ORDER — SENNOSIDES-DOCUSATE SODIUM 8.6-50 MG PO TABS: 1.0000 | ORAL_TABLET | Freq: Every evening | ORAL | Status: DC | PRN

## 2017-12-10 MED ORDER — BACID PO TABS
2.0000 | ORAL_TABLET | Freq: Three times a day (TID) | ORAL | Status: DC
  Administered 2017-12-10 – 2017-12-19 (×26): 2 via ORAL
  Filled 2017-12-10 (×28): qty 2

## 2017-12-10 MED ORDER — VANCOMYCIN 50 MG/ML ORAL SOLUTION
125.0000 mg | Freq: Four times a day (QID) | ORAL | Status: DC
  Administered 2017-12-10 – 2017-12-19 (×34): 125 mg via ORAL
  Filled 2017-12-10 (×35): qty 2.5

## 2017-12-10 MED ORDER — LOSARTAN POTASSIUM 50 MG PO TABS
50.0000 mg | ORAL_TABLET | Freq: Every day | ORAL | Status: DC
  Administered 2017-12-11 – 2017-12-19 (×9): 50 mg via ORAL
  Filled 2017-12-10 (×9): qty 1

## 2017-12-10 MED ORDER — TICAGRELOR 90 MG PO TABS
90.0000 mg | ORAL_TABLET | Freq: Two times a day (BID) | ORAL | Status: DC
  Administered 2017-12-10 – 2017-12-19 (×18): 90 mg via ORAL
  Filled 2017-12-10 (×18): qty 1

## 2017-12-10 NOTE — Discharge Summary (Signed)
Stroke Discharge Summary  Patient ID: William Lowery   MRN: [id number]      DOB: 04-13-1966  Date of Admission: 12/01/2017 Date of Discharge: 12/10/2017  Attending Physician:  Rosalin Hawking, MD, Stroke MD Consultant(s):   cardiology, pulmonary/intensive care, ID, rehabilitation medicine and Interventional Radiology Patient's PCP:  Deland Pretty, MD  Discharge Diagnoses: Active Problems:   Acute CVA (cerebrovascular accident) ([place]) left posterior cerebral artery infarct with left P1 occlusion status post IV TPA and mechanical thrombectomy with TICI 2b reperfusion     Acute arterial ischemic stroke, multifocal, posterior circulation ([facility]) AV calcified atheroma vs. Vegetation C diff Leukocytosis Hypertension Morbid Obesity Hypokalemia Thoracic Aortic Aneurysm Thyroid Nodule  Past Medical History:  Diagnosis Date  . Asthma    hx of  . Heart murmur    as an infant  . Hypertension   . Sleep apnea    wears CPAP   Past Surgical History:  Procedure Laterality Date  . bellybutton procedure     as a newborn  . IR ANGIO INTRA EXTRACRAN SEL COM CAROTID INNOMINATE BILAT MOD SED  12/01/2017  . IR ANGIO VERTEBRAL SEL VERTEBRAL UNI R MOD SED  12/01/2017  . IR CT HEAD LTD  12/01/2017  . IR INTRA CRAN STENT  12/01/2017  . IR PERCUTANEOUS ART THROMBECTOMY/INFUSION INTRACRANIAL INC DIAG ANGIO  12/01/2017  . RADIOLOGY WITH ANESTHESIA N/A 12/01/2017   Procedure: RADIOLOGY WITH ANESTHESIA CODE STROKE;  Surgeon: Radiologist, Medication, MD;  Location: [facility];  Service: Radiology;  Laterality: N/A;  . TEE WITHOUT CARDIOVERSION N/A 12/06/2017   Procedure: TRANSESOPHAGEAL ECHOCARDIOGRAM (TEE);  Surgeon: Pixie Casino, MD;  Location: [facility] ENDOSCOPY;  Service: Cardiovascular;  Laterality: N/A;    Medications to be continued on Rehab . aspirin EC  81 mg Oral Daily  . atorvastatin  10 mg Oral q1800  . feeding supplement (ENSURE ENLIVE)  237 mL Oral BID BM  . lactobacillus acidophilus  2 tablet Oral TID   . losartan  50 mg Oral Daily  . potassium chloride  40 mEq Oral TID  . ticagrelor  90 mg Oral BID  . vancomycin  125 mg Oral QID    LABORATORY STUDIES CBC    Component Value Date/Time   WBC 10.0 12/08/2017 0550   RBC 4.94 12/08/2017 0550   HGB 15.1 12/08/2017 0550   HCT 43.8 12/08/2017 0550   PLT 266 12/08/2017 0550   MCV 88.7 12/08/2017 0550   MCH 30.6 12/08/2017 0550   MCHC 34.5 12/08/2017 0550   RDW 13.3 12/08/2017 0550   LYMPHSABS 1.4 12/03/2017 0422   MONOABS 0.7 12/03/2017 0422   EOSABS 0.2 12/03/2017 0422   BASOSABS 0.0 12/03/2017 0422   CMP    Component Value Date/Time   NA 139 12/10/2017 0421   K 3.6 12/10/2017 0421   CL 107 12/10/2017 0421   CO2 22 12/10/2017 0421   GLUCOSE 84 12/10/2017 0421   BUN 11 12/10/2017 0421   CREATININE 0.76 12/10/2017 0421   CALCIUM 8.5 (L) 12/10/2017 0421   PROT 7.0 12/01/2017 1550   ALBUMIN 3.9 12/01/2017 1550   AST 39 12/01/2017 1550   ALT 51 12/01/2017 1550   ALKPHOS 68 12/01/2017 1550   BILITOT 2.1 (H) 12/01/2017 1550   GFRNONAA >60 12/10/2017 0421   GFRAA >60 12/10/2017 0421   COAGS Lab Results  Component Value Date   INR 0.92 12/01/2017   Lipid Panel    Component Value Date/Time   CHOL 144 12/02/2017 0535  TRIG 246 (H) 12/02/2017 0535   TRIG 244 (H) 12/02/2017 0535   HDL 37 (L) 12/02/2017 0535   CHOLHDL 3.9 12/02/2017 0535   VLDL 49 (H) 12/02/2017 0535   LDLCALC 58 12/02/2017 0535   HgbA1C  Lab Results  Component Value Date   HGBA1C 5.7 (H) 12/02/2017   Urinalysis No results found for: COLORURINE, APPEARANCEUR, LABSPEC, PHURINE, GLUCOSEU, HGBUR, BILIRUBINUR, KETONESUR, PROTEINUR, UROBILINOGEN, NITRITE, LEUKOCYTESUR Urine Drug Screen No results found for: LABOPIA, COCAINSCRNUR, LABBENZ, AMPHETMU, THCU, LABBARB  Alcohol Level No results found for: [facility]  SIGNIFICANT DIAGNOSTIC STUDIES Ct Angio Head W Or Wo Contrast Ct Angio Neck W Or Wo Contrast  Ct Cerebral Perfusion W  Contrast 12/01/2017 IMPRESSION:  1. 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 14 cc of left PCA distribution penumbra with no infarct by CTP.  2. 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 No embolic source identified in the head or neck. No atheromatous changes or chronic stenoses.  3. 2.5 cm right thyroid nodule, recommend sonographic follow-up.   Ct Head Wo Contrast 12/01/2017 IMPRESSION:  1. Subarachnoid hyperdensity within the posterior left temporal lobe and left occipital lobe, which could be contrast staining or petechial/subarachnoid hemorrhage.  2. No herniation, hydrocephalus or intraparenchymal hematoma.   Ct Head Code Stroke Wo Contrast 12/01/2017 IMPRESSION:  1. Possible hyperdense left P1 segment.  2. Negative for hemorrhage or visible acute infarct.  3. Small remote right inferior cerebellar infarct.   Mri and Mra Head Wo Contrast 12/03/2017 IMPRESSION: 1. Multifocal acute ischemia within the posterior circulation territories, predominantly within the left PCA distribution, including the left occipital lobe and left thalamus. Numerous smaller foci of acute ischemia throughout both cerebellar hemispheres. 2. Minimal susceptibility blooming in the left occipital lobe, likely indicating the presence of a minimal amount of petechial blood. 3. Stent within the P1 segment of the left PCA without flow related enhancement distal to the stent. The stent may be occluded. This could be further assessed with CTA of the head, if clinically warranted.   CT HEAD  1. Continued decrease in subarachnoid high-density along the posterior left cerebral hemisphere. No hydrocephalus. 2. Moderate acute left occipital infarct without detected progression. 3. Question small right superior cerebellar infarct.  Transthoracic Echocardiogram - Left ventricle: The cavity size was normal. Wall thickness was increased in a pattern of mild LVH. Systolic function was normal. The  estimated ejection fraction was in the range of 55% to 60%. Wall motion was normal; there were no regional wall motion abnormalities. Left ventricular diastolic function parameters were normal. - Aortic valve: Moderately calcified annulus. Trileaflet; moderately thickened leaflets. There was moderate stenosis. There was mild regurgitation. Mean gradient (S): 22 mm Hg. Valve area (VTI): 1.36 cm^2. Valve area (Vmax): 1.17 cm^2. Valve area (Vmean): 1.25 cm^2. - Mitral valve: Mildly calcified annulus. Mildly thickened leaflets  Cerebral angiogram - Dr. Estanislado Pandy 12/01/2017 S/P bilateral common carotid and vertebral artery angiograms,followed By partial revascularization of Lt PCA with x 2 passes with solitaire 29mm x 40 mm retriever device ,x 1 pass with the embotrap retriever device And placement of intracranial stent in the Lt PCA for recurrent occlusion after retriever passes achieving a TICI 2b re pertfusion,followe by .75 mg og aggrastat IA into the basilar artery with slow revascularization of intrastent occlusion.due to acute platelet aggregation. Also loaded with brilinta 180 mg and aspirin 81 mg via orogastric tube prior to stent placement  PORTABLE CHEST 1 VIEW   12/05/2017 COMPARISON: 12/03/2017 and earlier. IMPRESSION:  1. Extubated and enteric tube removed. 2. No acute cardiopulmonary abnormality.  Lower Venous Study 12/04/2017 at 1:21:55 PM. Final Interpretation: Right: There is no evidence of deep vein thrombosis in the lower extremity.There is no evidence of superficial venous thrombosis. No cystic structure found in the popliteal fossa. Left: There is no evidence of deep vein thrombosis in the lower extremity.There is no evidence of superficial venous thrombosis. No cystic structure found in the popliteal fossa.  TEE IMPRESSION:  1. No LAA thrombus 2. Negative for PFO 3. 0.5 x 1.0 cm calcified mass adjacent to the RCC that restricts movement, this  could be atheroma or organized vegetation. 4. Trileaflet, but thickened aortic valve with moderate stenosis - mean gradient 29 mmHg 5. Moderate to severe LVH 6. LVEF 65-70% 7. Small late right to left shunt, suggestive of intrapulmonary or intrahepatic shunting 8. Ascending aortic aneurysm, measuring at least 4.1 cm  CT ANGIOGRAPHY CHEST WITH CONTRAST 12/06/2017 21:01 IMPRESSION: 4.3 cm ascending thoracic aortic aneurysm.  Recommend annual imaging followup by CTA or MRA. Small bilateral pleural effusions with dependent atelectasis.    HISTORY OF [facility] COURSE ASSESSMENT/PLAN William Lowery is a 52 y.o. male with history of asthma, hypertension, and obstructive sleep apnea (wears C Pap) presenting with right-sided numbness. The patient received TPA on Saturday, 12/01/2017 at [facility]. -> Interventional radiology.  Stroke: Left PCA infarct with P1 occlusion s/p tPA and IR with TICI 2b reperfusion and left P1 stent - possibly embolic - source unknown. Additional right SCA and b/l PICA infarcts likely due to procedure  Resultant right hemiparesthesia, right upper quadrantanopia  CT head repeat decreased subarachnoid hyperdensity, left PCA infact  MRI head - left PCA, right SCA and b/l PICA infarcts  MRA head - left PCA occlusion s/p stent, right SCA occlusion  2D Echo - EF 55-60%  LE venous doppler no DVT  Hypercoagulable work up negative  TEE - AV calcified atheroma vs vegetation   CT Chest - 4.3 cm ascending thoracic aortic aneurysm  Consider 30 day cardiac event monitor as out pt  LDL - 58  HgbA1c - 5.7  VTE prophylaxis - heparin subq  Fall precautions  Diet Heart Room service appropriate? Yes; Fluid consistency: Thin  No antithrombotic prior to admission, now on aspirin 81 mg daily and Brilinta.   Patient counseled to be compliant with his antithrombotic medications  Ongoing aggressive stroke risk factor management  Therapy  recommendations:  CIR  Disposition:  CIR  AV atheroma vs. Vegetation  Found on TEE  Not well see on CTA aorta  Blood culture NGTD  Dr. Debara Pickett consider this could be the source of emboli. No loop recorder needed at this time. Pt will follow up with Dr. Debara Pickett as outpt  May consider 30 day cardiac event monitoring as outpt  30 day follow up ECHO  C diff  C diff toxin positive  PO venco per ID recs  PO Vancomycin started 12/06/2017 X 14 days   ID consult - continue vanco for 2 weeks, bowel supplement with yogurt  No need to retest for CDiff  Leukocytosis   WBC 9.2->14.2->16.5->12.6->10.0  Likely due to C diff infection  Blood culture NGTD  Hypertension  Stable   Started Losartan 50 mg 12/05/2017 -> BP better              Long-term BP goal normotensive  Other Stroke Risk Factors  Obesity, Body mass index is 43.72 kg/m., recommend weight loss,  diet and exercise as appropriate   Hx stroke/TIA - remote right PICA infarct by imaging  OSA, on CPAP at home, RT Consulted, Incentive Spirometry  Other Active Problems  Hypokalemia - replacement in progress - 3.6 -today  CTA Chest 4.3 cm ascending thoracic aortic aneurysm  Thyroid nodule - F/U recommended    DISCHARGE EXAM Blood pressure 130/80, pulse 77, temperature 98.9 F (37.2 C), temperature source Oral, resp. rate 18, height 5\' 10"  (1.778 m), weight (!) 138.3 kg (304 lb 14.3 oz), SpO2 99 %. General - Well nourished, well developed, more alert on exam today.  Ophthalmologic - Fundi not visualized due to small pupils.  Cardiovascular - Regular rate and rhythm.  Neuro - awake alert, depressed mood. Eyes open and following simple commands. PERRL, right upper quadrantanopia. Eye moving both directions, no gaze preference. No facial droop or tongue deviation. LUE and LLE 5/5. RUE 5-/5 proximal with mild pronator drift but 5/5 distal. RLE 5/5. DTR 1+ and no babinski. Sensation decreased on the right,  coordination intact and gait not tested.  Discharge Diet  Fall precautions Diet Heart Room service appropriate? Yes; Fluid consistency: Thin liquids  DISCHARGE PLAN  Disposition:  Transfer to [facility] for ongoing PT, OT and ST  aspirin 81 mg daily for secondary stroke prevention.  Recommend ongoing risk factor control by Primary Care Physician at time of discharge from inpatient rehabilitation.  Follow-up Deland Pretty, MD in 2 weeks following discharge from rehab.  Follow-up with Dr. Antony Contras, Stroke Clinic in 6 weeks, office to schedule an appointment.   Follow up with Cardiology  Greater than 35 minutes were spent preparing discharge.  Mary Sella, ANP-C Neurology Stroke Team 12/10/2017 1:37 PM I have personally examined this patient, reviewed notes, independently viewed imaging studies, participated in medical decision making and plan of care.ROS completed by me personally and pertinent positives fully documented  I have made any additions or clarifications directly to the above note. Agree with note above.   Antony Contras, MD Medical Director [facility] Pager: [phone] 12/10/2017 2:45 PM

## 2017-12-10 NOTE — Progress Notes (Signed)
I met with pt and his wife after working with both PT and OT. Pt currently uninsured and both pt and wife aware that I am admitting him as uninsured with average daily cost $3300. Both in agreement. I contacted Stanton Kidney, NP and we will make the arrangements to admit to CIR today. RN CM made aware. [phone]

## 2017-12-10 NOTE — Progress Notes (Signed)
Physical Therapy Treatment Patient Details Name: William Lowery MRN: [id number] DOB: Sep 10, 1966 Today's Date: 12/10/2017    History of Present Illness 52 y.o. male with PMHx: HTN, sleep apnea, right cerebellar CVA. Admitted with L PCA, R SCA, bil PICA infarcts s/p tPA and thrombectomy with occluded stents, intubated 1/26-1/28    PT Comments    Pt is making steady progress towards his goals but continues to be limited by increased weakness, decreased balance and slow processing. Pt is currently min guard for bed mobility, minAx2 for transfers and ambulation of 20 feet with RW. Pt remains an excellent candidate for CIR given his desire to return to PLOF and need for SLP, OT and PT.    Follow Up Recommendations  CIR;Supervision/Assistance - 24 hour     Equipment Recommendations  Rolling walker with 5" wheels    Recommendations for Other Services Rehab consult     Precautions / Restrictions Precautions Precautions: Fall Restrictions Weight Bearing Restrictions: No    Mobility  Bed Mobility Overal bed mobility: Needs Assistance Bed Mobility: Supine to Sit     Supine to sit: Min guard;HOB elevated     General bed mobility comments: HOB slightly elevated, MinGuard for safety though no physical assist needed   Transfers Overall transfer level: Needs assistance Equipment used: Rolling walker (2 wheeled) Transfers: Sit to/from Stand Sit to Stand: Min assist;+2 physical assistance;+2 safety/equipment         General transfer comment: minAx2 for power up and steadying in RW, VCs for hand placement   Ambulation/Gait Ambulation/Gait assistance: Min assist;+2 safety/equipment Ambulation Distance (Feet): 20 Feet Assistive device: Rolling walker (2 wheeled) Gait Pattern/deviations: Step-to pattern;Wide base of support;Decreased stride length Gait velocity: slowed Gait velocity interpretation: Below normal speed for age/gender General Gait Details: minAx2 for steadying with  RW, vc RW management around obstacles in room    Modified Rankin (Stroke Patients Only) Modified Rankin (Stroke Patients Only) Pre-Morbid Rankin Score: No symptoms Modified Rankin: Moderately severe disability     Balance Overall balance assessment: Needs assistance Sitting-balance support: Feet supported Sitting balance-Leahy Scale: Good     Standing balance support: During functional activity;No upper extremity supported Standing balance-Leahy Scale: Poor Standing balance comment: required minA for standing balance during grooming tasks                             Cognition Arousal/Alertness: Awake/alert Behavior During Therapy: Flat affect;WFL for tasks assessed/performed Overall Cognitive Status: Impaired/Different from baseline Area of Impairment: Attention;Following commands;Safety/judgement;Awareness;Problem solving                   Current Attention Level: Selective Memory: Decreased short-term memory Following Commands: Follows one step commands consistently;Follows multi-step commands with increased time;Follows multi-step commands inconsistently Safety/Judgement: Decreased awareness of deficits Awareness: Intellectual Problem Solving: Slow processing;Decreased initiation;Difficulty sequencing;Requires verbal cues;Requires tactile cues General Comments: Pt continues to present with flat affect; requires increased time to initiate, sequence and complete tasks, requiring intermittent cues for sucessful task completion; questioning cues utilized to sequence through grooming tasks          General Comments General comments (skin integrity, edema, etc.): Wife present during session      Pertinent Vitals/Pain Pain Assessment: No/denies pain           PT Goals (current goals can now be found in the care plan section) Acute Rehab PT Goals Patient Stated Goal: return to work PT Goal Formulation: With patient/family Time For Goal  Achievement:  12/18/17 Potential to Achieve Goals: Fair Progress towards PT goals: Progressing toward goals    Frequency    Min 4X/week      PT Plan Current plan remains appropriate    Co-evaluation PT/OT/SLP Co-Evaluation/Treatment: Yes Reason for Co-Treatment: For patient/therapist safety PT goals addressed during session: Mobility/safety with mobility OT goals addressed during session: ADL's and self-care;Proper use of Adaptive equipment and DME      AM-PAC PT "6 Clicks" Daily Activity  Outcome Measure  Difficulty turning over in bed (including adjusting bedclothes, sheets and blankets)?: A Lot Difficulty moving from lying on back to sitting on the side of the bed? : A Lot Difficulty sitting down on and standing up from a chair with arms (e.g., wheelchair, bedside commode, etc,.)?: Unable Help needed moving to and from a bed to chair (including a wheelchair)?: A Little Help needed walking in hospital room?: A Little Help needed climbing 3-5 steps with a railing? : Total 6 Click Score: 12    End of Session Equipment Utilized During Treatment: Gait belt Activity Tolerance: Patient tolerated treatment well Patient left: with call bell/phone within reach;in chair;with chair alarm set Nurse Communication: Mobility status PT Visit Diagnosis: Other abnormalities of gait and mobility (R26.89);Unsteadiness on feet (R26.81);Other symptoms and signs involving the nervous system (Z30.076)     Time: 2263-3354 PT Time Calculation (min) (ACUTE ONLY): 33 min  Charges:  $Gait Training: 8-22 mins                    G Codes:       Christain Mcraney B. Migdalia Dk PT, DPT Acute Rehabilitation  [phone] Pager [phone]     [place] 12/10/2017, 2:16 PM

## 2017-12-10 NOTE — Progress Notes (Signed)
Patient and family were informed about rehab process including patient safety plan and rehab booklet. 

## 2017-12-10 NOTE — Progress Notes (Signed)
Occupational Therapy Treatment Patient Details Name: William Lowery MRN: [id number] DOB: Jul 03, 1966 Today's Date: 12/10/2017    History of present illness 52 y.o. male with PMHx: HTN, sleep apnea, right cerebellar CVA. Admitted with L PCA, R SCA, bil PICA infarcts s/p tPA and thrombectomy with occluded stents, intubated 1/26-1/28   OT comments  Pt making progress towards OT goals, completing room level functional mobility at RW level with MinA (+2 for safety) during session. Pt completed standing grooming ADLs with MinA (+2), continues to require mod cues for task initiation and sequencing. Pt continues to demonstrate visual deficits and RUE fine motor difficulties during functional task completion. Pt remains motivated and willing to work with therapy. Feel Pt remains an excellent candidate for CIR level therapies prior to return home, will continue to follow acutely to maximize his safety and independence with ADLs and mobility.    Follow Up Recommendations  CIR;Supervision/Assistance - 24 hour    Equipment Recommendations  Other (comment)(defer to next venue )          Precautions / Restrictions Precautions Precautions: Fall Restrictions Weight Bearing Restrictions: No       Mobility Bed Mobility Overal bed mobility: Needs Assistance Bed Mobility: Supine to Sit     Supine to sit: Min guard;HOB elevated     General bed mobility comments: HOB slightly elevated, MinGuard for safety though no physical assist needed   Transfers Overall transfer level: Needs assistance Equipment used: Rolling walker (2 wheeled) Transfers: Sit to/from Stand Sit to Stand: Min assist;+2 physical assistance;+2 safety/equipment         General transfer comment: minAx2 for power up and steadying in RW, VCs for hand placement     Balance Overall balance assessment: Needs assistance Sitting-balance support: Feet supported Sitting balance-Leahy Scale: Good     Standing balance support:  During functional activity;No upper extremity supported Standing balance-Leahy Scale: Poor Standing balance comment: required minA for standing balance during grooming tasks                            ADL either performed or assessed with clinical judgement   ADL Overall ADL's : Needs assistance/impaired     Grooming: Wash/dry face;Oral care;Standing;Minimal assistance;Cueing for sequencing Grooming Details (indicate cue type and reason): MinA +2 in standing                     Toileting- Clothing Manipulation and Hygiene: Maximal assistance;+2 for physical assistance;+2 for safety/equipment;Bed level Toileting - Clothing Manipulation Details (indicate cue type and reason): NT/RN completing peri-care after BM at bed level at start of session      Functional mobility during ADLs: Minimal assistance;+2 for safety/equipment;+2 for physical assistance General ADL Comments: Pt requires increased time to initiate and sequence standing grooming tasks, requiring verbal/questioning cues; Pt able to use RUE to assist with task completion though with increased time/effort; Pt continues to present with decreased cognition, vision, decreased dynamic balance      Vision   Vision Assessment?: Vision impaired- to be further tested in functional context Additional Comments: Pt requires verbal/questioning cues to avoid obstacles to L and R during room level functional mobility, Pt often under/overshooting when reaching for items on sink using RUE               Cognition Arousal/Alertness: Awake/alert Behavior During Therapy: Flat affect;WFL for tasks assessed/performed Overall Cognitive Status: Impaired/Different from baseline Area of Impairment: Attention;Following commands;Safety/judgement;Awareness;Problem solving  Current Attention Level: Selective Memory: Decreased short-term memory Following Commands: Follows one step commands  consistently;Follows multi-step commands with increased time;Follows multi-step commands inconsistently Safety/Judgement: Decreased awareness of deficits Awareness: Intellectual Problem Solving: Slow processing;Decreased initiation;Difficulty sequencing;Requires verbal cues;Requires tactile cues General Comments: Pt continues to present with flat affect; requires increased time to initiate, sequence and complete tasks, requiring intermittent cues for sucessful task completion; questioning cues utilized to sequence through grooming tasks                           Pertinent Vitals/ Pain       Pain Assessment: No/denies pain                                                          Frequency  Min 3X/week        Progress Toward Goals  OT Goals(current goals can now be found in the care plan section)  Progress towards OT goals: Progressing toward goals  Acute Rehab OT Goals Patient Stated Goal: return to work OT Goal Formulation: With patient/family Time For Goal Achievement: 12/18/17 Potential to Achieve Goals: Good  Plan Discharge plan remains appropriate    Co-evaluation    PT/OT/SLP Co-Evaluation/Treatment: Yes Reason for Co-Treatment: Complexity of the patient's impairments (multi-system involvement);For patient/therapist safety;To address functional/ADL transfers   OT goals addressed during session: ADL's and self-care;Proper use of Adaptive equipment and DME      AM-PAC PT "6 Clicks" Daily Activity     Outcome Measure   Help from another person eating meals?: A Little Help from another person taking care of personal grooming?: A Little Help from another person toileting, which includes using toliet, bedpan, or urinal?: A Lot Help from another person bathing (including washing, rinsing, drying)?: A Lot Help from another person to put on and taking off regular upper body clothing?: A Little Help from another person to put on and taking  off regular lower body clothing?: A Lot 6 Click Score: 15    End of Session Equipment Utilized During Treatment: Gait belt;Rolling walker  OT Visit Diagnosis: Unsteadiness on feet (R26.81);Other abnormalities of gait and mobility (R26.89);Muscle weakness (generalized) (M62.81);Other symptoms and signs involving cognitive function;Low vision, both eyes (H54.2);Hemiplegia and hemiparesis Hemiplegia - Right/Left: Right Hemiplegia - dominant/non-dominant: Dominant Hemiplegia - caused by: Cerebral infarction   Activity Tolerance Patient tolerated treatment well   Patient Left in chair;with call bell/phone within reach;with family/visitor present   Nurse Communication Mobility status        Time: 4287-6811 OT Time Calculation (min): 33 min  Charges: OT General Charges $OT Visit: 1 Visit OT Treatments $Self Care/Home Management : 8-22 mins  Lou Cal, OT Pager [phone] 12/10/2017    Raymondo Band 12/10/2017, 1:05 PM

## 2017-12-10 NOTE — H&P (Signed)
Physical Medicine and Rehabilitation Admission H&P    Chief Complaint  Patient presents with  . Code Stroke  : HPI: William Lowery is a 52 y.o.right handed male with history of hypertension, sleep apnea, obesity. Per chart review patient lives with spouse. One level home two steps to entry. Independent prior to admission.He works full time and self-employed with [employer]. Presented 12/01/2017 with right-sided numbness as well as intermittent headache and right eye visual blurriness. Cranial CT scan reviewed, unremarkable for acute intracranial process. Per report, small remote right inferior cerebellar infarction. CT angiogram head and neck showed left PCA occlusion beginning at its origin. Cerebral angiogram interventional radiology and underwent thrombectomy.Patient remained intubated through 12/03/2017. Follow-up MRI showed multifocal acute ischemia within the posterior circulation territories predominantly within the left PCA distribution, including left occipital lobe and left thalamus.Echocardiogram with ejection fraction of 55-60%. Lower extremity Dopplers negative. TEE/loop recorder placement 12/06/2017 showed ejection fraction of 70%.. No thrombus and negative for PFO. 0.51.0 cm calcified mass adjacent to the [place] that restricts movement question vegetation with CT angiogram of the chest completed for confirmation showing a 4.3 cm ascending thoracic aortic aneurysm and recommendations for annual imaging follow-up by CTA or MRA. Neurology consulted presently maintained on aspirin as well as Brilinta. Tolerating a regular consistency diet. Bouts of hypokalemia with supplement added. C. difficile specimen 12/06/2017 positive placed on vancomycin orally 125 mg 4 times a day 14 days as recommended by infectious disease. Latest blood culture 12/07/2017 no growth. Physical and occupational therapy evaluations completed with recommendations of physical medicine rehabilitation consult.  Patient was admitted for a comprehensive rehabilitation program  Review of Systems  Constitutional: Negative for chills and fever.  HENT: Negative for hearing loss.   Eyes: Positive for blurred vision.  Respiratory: Positive for shortness of breath.   Cardiovascular: Positive for leg swelling. Negative for chest pain and palpitations.  Gastrointestinal: Positive for diarrhea and nausea. Negative for vomiting.  Genitourinary: Positive for urgency. Negative for dysuria, flank pain and hematuria.  Musculoskeletal: Positive for myalgias.  Skin: Negative for rash.  Neurological: Positive for focal weakness and headaches. Negative for seizures.  All other systems reviewed and are negative.  Past Medical History:  Diagnosis Date  . Asthma    hx of  . Heart murmur    as an infant  . Hypertension   . Sleep apnea    wears CPAP   Past Surgical History:  Procedure Laterality Date  . bellybutton procedure     as a newborn  . IR ANGIO INTRA EXTRACRAN SEL COM CAROTID INNOMINATE BILAT MOD SED  12/01/2017  . IR ANGIO VERTEBRAL SEL VERTEBRAL UNI R MOD SED  12/01/2017  . IR CT HEAD LTD  12/01/2017  . IR INTRA CRAN STENT  12/01/2017  . IR PERCUTANEOUS ART THROMBECTOMY/INFUSION INTRACRANIAL INC DIAG ANGIO  12/01/2017  . RADIOLOGY WITH ANESTHESIA N/A 12/01/2017   Procedure: RADIOLOGY WITH ANESTHESIA CODE STROKE;  Surgeon: Radiologist, Medication, MD;  Location: [facility];  Service: Radiology;  Laterality: N/A;  . TEE WITHOUT CARDIOVERSION N/A 12/06/2017   Procedure: TRANSESOPHAGEAL ECHOCARDIOGRAM (TEE);  Surgeon: Pixie Casino, MD;  Location: [facility] ENDOSCOPY;  Service: Cardiovascular;  Laterality: N/A;   Family History  Problem Relation Age of Onset  . Colon cancer Neg Hx   . Esophageal cancer Neg Hx   . Rectal cancer Neg Hx   . Stomach cancer Neg Hx    Social History:  reports that  has never smoked. His smokeless  tobacco use includes snuff. He reports that he does not drink alcohol or use  drugs. Allergies: No Known Allergies Facility-Administered Medications Prior to Admission  Medication Dose Route Frequency Provider Last Rate Last Dose  . 0.9 %  sodium chloride infusion  500 mL Intravenous Continuous Milus Banister, MD       Medications Prior to Admission  Medication Sig Dispense Refill  . B Complex Vitamins (B-COMPLEX/B-12) TABS Take 1 tablet by mouth daily.    . Cholecalciferol (VITAMIN D PO) Take by mouth daily.    Marland Kitchen losartan-hydrochlorothiazide (HYZAAR) 100-25 MG tablet Take 1 tablet by mouth daily.  3  . Omega-3 Fatty Acids (FISH OIL PO) Take by mouth daily.    Marland Kitchen VITAMIN E PO Take by mouth daily.      Drug Regimen Review Drug regimen was reviewed and remains appropriate with no significant issues identified  Home: Home Living Family/patient expects to be discharged to:: Private residence Living Arrangements: Spouse/significant other Available Help at Discharge: Family, Available 24 hours/day Type of Home: House Home Access: Stairs to enter Technical brewer of Steps: 2 Home Layout: One level Bathroom Shower/Tub: Tub/shower unit, Multimedia programmer: Programmer, systems: Yes Home Equipment: None  Lives With: Spouse   Functional History: Prior Function Level of Independence: Independent Comments: ADLs, IADLs, driving, and works full time and owns [employer]  Functional Status:  Mobility: Bed Mobility Overal bed mobility: Needs Assistance Bed Mobility: Supine to Sit Supine to sit: Min guard, HOB elevated Sit to supine: Max assist, +2 for safety/equipment General bed mobility comments: HOB slightly elevated, MinGuard for safety though no physical assist needed  Transfers Overall transfer level: Needs assistance Equipment used: Rolling walker (2 wheeled) Transfers: Sit to/from Stand Sit to Stand: Min assist, +2 physical assistance, +2 safety/equipment Stand pivot transfers: Min assist General transfer  comment: minAx2 for power up and steadying in RW, VCs for hand placement  Ambulation/Gait Ambulation/Gait assistance: Min assist, +2 safety/equipment Ambulation Distance (Feet): 20 Feet Assistive device: Rolling walker (2 wheeled) Gait Pattern/deviations: Step-to pattern, Wide base of support, Decreased stride length General Gait Details: minAx2 for steadying with RW, vc RW management around obstacles in room Gait velocity: slowed Gait velocity interpretation: Below normal speed for age/gender    ADL: ADL Overall ADL's : Needs assistance/impaired Eating/Feeding: Moderate assistance, Sitting Grooming: Wash/dry face, Oral care, Standing, Minimal assistance, Cueing for sequencing Grooming Details (indicate cue type and reason): MinA +2 in standing Upper Body Bathing: Moderate assistance, Sitting Lower Body Bathing: Maximal assistance, +2 for safety/equipment, Sit to/from stand Upper Body Dressing : Moderate assistance, Sitting Lower Body Dressing: Moderate assistance, Sit to/from stand Lower Body Dressing Details (indicate cue type and reason): Pt able to don/doff socks EOB with min guard assist.  Assist to pull pants over hips in standing  Toilet Transfer: Minimal assistance, Stand-pivot, BSC Toilet Transfer Details (indicate cue type and reason): Min A +2 for stand pivot to recliner. Pt requiring significant amount if time and cues Toileting- Clothing Manipulation and Hygiene: Maximal assistance, +2 for physical assistance, +2 for safety/equipment, Bed level Toileting - Clothing Manipulation Details (indicate cue type and reason): NT/RN completing peri-care after BM at bed level at start of session  Functional mobility during ADLs: Minimal assistance, +2 for safety/equipment, +2 for physical assistance General ADL Comments: Pt requires increased time to initiate and sequence standing grooming tasks, requiring verbal/questioning cues; Pt able to use RUE to assist with task completion though  with increased time/effort; Pt continues  to present with decreased cognition, vision, decreased dynamic balance   Cognition: Cognition Overall Cognitive Status: Impaired/Different from baseline Arousal/Alertness: Awake/alert Orientation Level: Oriented X4 Attention: Sustained Sustained Attention: Impaired Sustained Attention Impairment: Verbal basic Cognition Arousal/Alertness: Awake/alert Behavior During Therapy: Flat affect, WFL for tasks assessed/performed Overall Cognitive Status: Impaired/Different from baseline Area of Impairment: Attention, Following commands, Safety/judgement, Awareness, Problem solving Orientation Level: Disoriented to, Time, Situation(states hes in the hospital but did not know which one) Current Attention Level: Selective Memory: Decreased short-term memory Following Commands: Follows one step commands consistently, Follows multi-step commands with increased time, Follows multi-step commands inconsistently Safety/Judgement: Decreased awareness of deficits Awareness: Intellectual Problem Solving: Slow processing, Decreased initiation, Difficulty sequencing, Requires verbal cues, Requires tactile cues General Comments: Pt continues to present with flat affect; requires increased time to initiate, sequence and complete tasks, requiring intermittent cues for sucessful task completion; questioning cues utilized to sequence through grooming tasks  Difficult to assess due to: Impaired communication  Physical Exam: Blood pressure 130/80, pulse 77, temperature 98.9 F (37.2 C), temperature source Oral, resp. rate 18, height 5' 10"  (1.778 m), weight (!) 138.3 kg (304 lb 14.3 oz), SpO2 99 %. Physical Exam  Vitals reviewed. Constitutional: He appears well-developed.  52 year old right-handed male +Obese  HENT:  Head: Normocephalic and atraumatic.  Eyes: EOM are normal. Right eye exhibits no discharge. Left eye exhibits no discharge.  Pupils reactive to light. No  nystagmus  Neck: Normal range of motion. Neck supple. No thyromegaly present.  Cardiovascular: Normal rate and regular rhythm.  Respiratory: Effort normal and breath sounds normal. No respiratory distress.  GI: Soft. Bowel sounds are normal. He exhibits no distension.  Musculoskeletal:  No edema or tenderness in extremities  Neurological: He is alert.  Mood is flat but appropriate.  He does have some word finding difficulties and would easily become frustrated.  Followed simple commands.  He was able to provide his name. Motor: RUE: 4/5 proximal to distal with apraxia RLE: 4/5 proximal to distal with apraxia LUE: 5/5 proximal to distal LLE: 4+/5 proximal to distal  Skin: Skin is warm and dry.  Vascular changes b/l LE  Psychiatric: His affect is blunt. His speech is delayed. He is slowed.  Confused    Results for orders placed or performed during the hospital encounter of 12/01/17 (from the past 48 hour(s))  Glucose, capillary     Status: None   Collection Time: 12/08/17 10:18 PM  Result Value Ref Range   Glucose-Capillary 81 65 - 99 mg/dL  Glucose, capillary     Status: None   Collection Time: 12/09/17  6:23 AM  Result Value Ref Range   Glucose-Capillary 96 65 - 99 mg/dL  Glucose, capillary     Status: None   Collection Time: 12/09/17 11:40 AM  Result Value Ref Range   Glucose-Capillary 87 65 - 99 mg/dL  Glucose, capillary     Status: Abnormal   Collection Time: 12/09/17  5:03 PM  Result Value Ref Range   Glucose-Capillary 135 (H) 65 - 99 mg/dL  Glucose, capillary     Status: Abnormal   Collection Time: 12/09/17  9:26 PM  Result Value Ref Range   Glucose-Capillary 189 (H) 65 - 99 mg/dL  Basic metabolic panel     Status: Abnormal   Collection Time: 12/10/17  4:21 AM  Result Value Ref Range   Sodium 139 135 - 145 mmol/L   Potassium 3.6 3.5 - 5.1 mmol/L   Chloride 107 101 - 111 mmol/L  CO2 22 22 - 32 mmol/L   Glucose, Bld 84 65 - 99 mg/dL   BUN 11 6 - 20 mg/dL    Creatinine, Ser 0.76 0.61 - 1.24 mg/dL   Calcium 8.5 (L) 8.9 - 10.3 mg/dL   GFR calc non Af Amer >60 >60 mL/min   GFR calc Af Amer >60 >60 mL/min    Comment: (NOTE) The eGFR has been calculated using the CKD EPI equation. This calculation has not been validated in all clinical situations. eGFR's persistently <60 mL/min signify possible Chronic Kidney Disease.    Anion gap 10 5 - 15    Comment: Performed at [facility] [address], [place], [place] [address]  Glucose, capillary     Status: Abnormal   Collection Time: 12/10/17  6:04 AM  Result Value Ref Range   Glucose-Capillary 112 (H) 65 - 99 mg/dL  Glucose, capillary     Status: Abnormal   Collection Time: 12/10/17 11:43 AM  Result Value Ref Range   Glucose-Capillary 119 (H) 65 - 99 mg/dL   No results found.  Medical Problem List and Plan: 1.  Gait disorder with visual disturbance secondary to left PCA infarction.PCA occlusion status post thrombectomy and placement of loop recorder 2.  DVT Prophylaxis/Anticoagulation: Brilinta. Venous Doppler studies negative 3. Pain Management: Tylenol as needed 4. Mood: Provide emotional support 5. Neuropsych: This patient is not fully capable of making decisions on his own behalf. 6. Skin/Wound Care: Routine skin checks 7. Fluids/Electrolytes/Nutrition: Routine I&O's with follow-up chemistries 8.History of hypertension. Cozaar 50 mg daily 9.Morbid obesity. BMI 44.7.Dietary follow-up 10.OSA. Patient on CPAP at home 11. Clostridium difficile. Contact precautions. Vancomycin 125 mg 4 times daily 12/06/2017 for 14 days   Post Admission Physician Evaluation: 1. Preadmission assessment reviewed and changes made below. 2. Functional deficits secondary  to left PCA infarction.PCA occlusion Status post thrombectomy. 3. Patient is admitted to receive collaborative, interdisciplinary care between the physiatrist, rehab nursing staff, and therapy team. 4. Patient's level of medical  complexity and substantial therapy needs in context of that medical necessity cannot be provided at a lesser intensity of care such as a SNF. 5. Patient has experienced substantial functional loss from his/her baseline which was documented above under the "Functional History" and "Functional Status" headings.  Judging by the patient's diagnosis, physical exam, and functional history, the patient has potential for functional progress which will result in measurable gains while on inpatient rehab.  These gains will be of substantial and practical use upon discharge  in facilitating mobility and self-care at the household level. 6. Physiatrist will provide 24 hour management of medical needs as well as oversight of the therapy plan/treatment and provide guidance as appropriate regarding the interaction of the two. 7. 24 hour rehab nursing will assist with safety, disease management, medication administration and patient education  and help integrate therapy concepts, techniques,education, etc. 8. PT will assess and treat for/with: Lower extremity strength, range of motion, stamina, balance, functional mobility, safety, adaptive techniques and equipment, coping skills, pain control, stroke education.   Goals are: Supervision. 9. OT will assess and treat for/with: ADL's, functional mobility, safety, upper extremity strength, adaptive techniques and equipment, ego support, and community reintegration.   Goals are: Supervision. Therapy may proceed with showering this patient. 10. SLP will assess and treat for/with: speech, cognition.  Goals are: Supervision/Min A. 11. Case Management and Social Worker will assess and treat for psychological issues and discharge planning. 12. Team conference will be held weekly  to assess progress toward goals and to determine barriers to discharge. 13. Patient will receive at least 3 hours of therapy per day at least 5 days per week. 14. ELOS: 10-16 days.       15. Prognosis:   good   Delice Lesch, MD, ABPMR Lavon Paganini Angiulli, PA-C 12/10/2017

## 2017-12-10 NOTE — Care Management Note (Signed)
Case Management Note  Patient Details  Name: William Lowery MRN: [id number] Date of Birth: 1966-01-21  Subjective/Objective:                    Action/Plan: Pt discharging to CIR today. No further needs per CM.  Expected Discharge Date:  12/10/17               Expected Discharge Plan:  [place]  In-House Referral:     Discharge planning Services  CM Consult  Post Acute Care Choice:    Choice offered to:     DME Arranged:    DME Agency:     HH Arranged:    HH Agency:     Status of Service:  Completed, signed off  If discussed at [employer], dates discussed:    Additional Comments:  Pollie Friar, RN 12/10/2017, 1:42 PM

## 2017-12-10 NOTE — Progress Notes (Signed)
William Gong, RN  Rehab Admission Coordinator  Physical Medicine and Rehabilitation  PMR Pre-admission  Signed  Date of Service:  12/07/2017 4:26 PM       Related encounter: ED to Hosp-Admission (Current) from 12/01/2017 in [facility]      Signed           [] Hide copied text  [] Hover for details   PMR Admission Coordinator Pre-Admission Assessment  Patient: William Lowery is an 52 y.o., male MRN: [id number] DOB: 1966/09/24 Height: 5\' 10"  (177.8 cm) Weight: (!) 138.3 kg (304 lb 14.3 oz)                                                                                                                                                  Insurance Information HMO:     PPO:      PCP:      IPA:      80/20:      OTHER:  PRIMARY: uninsured       Patient's medical insurance had lapsed 03/05/2017. Pt and wife are aware. I left two messages for pt's insurance agent, William Lowery, at [employer] at [phone] ext [phone] with no return call. I informed pt's wife on 12/07/2017 that unless she provided me with another medical plan information, that pt would be admitted as uninsured. William Lowery was in agreement.  Wife states she will follow up with insurance issues with her agent.  SECONDARY: none       Medicaid Application Date:       Case Manager:  Disability Application Date:       Case Worker:   Emergency Contact Information        Contact Information    Name Relation Home Work Mobile   William Lowery Spouse   [phone]     Current Medical History  Patient Admitting Diagnosis: left PCA infarct  History of Present Illness:  William Cottonis a 52 y.o.right handedmalewith history of hypertension, sleep apnea, obesity. Presented 12/01/2017 with right-sided numbness as well as intermittent headache and right eye visual blurriness. Cranial CT scan negative for hemorrhage or visible acute infarction. Small remote right inferior cerebellar  infarction. CT angiogram head and neck showed left PCA occlusion beginning at its origin. Cerebral angiogram interventional radiology and underwent thrombectomy.Patient remained intubated through 12/03/2017. Follow-up MRI showed multifocal acute ischemia within the posterior circulation territories predominantly within the left PCA distribution, including left occipital lobe and left thalamus.Echocardiogram with ejection fraction of 55-60%. Lower extremity Dopplers negative.TEE  12/06/2017 showed ejection fraction of 70%..No thrombus and negative for PFO. 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 Tolerating a regular consistency diet. Bouts of hypokalemia with supplement added.C. difficile specimen 12/06/2017 positive placed on vancomycin orally 125 mg 4 times a day 14 days  Total: 3 NIHSS  Past Medical History      Past Medical History:  Diagnosis Date  . Asthma    hx of  . Heart murmur    as an infant  . Hypertension   . Sleep apnea    wears CPAP    Family History  family history is not on file.  Prior Rehab/Hospitalizations:  Has the patient had major surgery during 100 days prior to admission? No  Current Medications   Current Facility-Administered Medications:  .  0.9 %  sodium chloride infusion, , Intravenous, Continuous, Costello, Mary A, NP, Last Rate: 50 mL/hr at 12/10/17 0525 .  acetaminophen (TYLENOL) tablet 650 mg, 650 mg, Oral, Q4H PRN, 650 mg at 12/05/17 2024 **OR** [DISCONTINUED] acetaminophen (TYLENOL) solution 650 mg, 650 mg, Per Tube, Q4H PRN, 650 mg at 12/03/17 0839 **OR** [DISCONTINUED] acetaminophen (TYLENOL) suppository 650 mg, 650 mg, Rectal, Q4H PRN,  Deveshwar, Sanjeev, MD .  aspirin EC tablet 81 mg, 81 mg, Oral, Daily, Rosalin Hawking, MD, 81 mg at 12/10/17 1009 .  atorvastatin (LIPITOR) tablet 10 mg, 10 mg, Oral, q1800, Rosalin Hawking, MD, 10 mg at 12/09/17 1738 .  feeding supplement (ENSURE ENLIVE) (ENSURE ENLIVE) liquid 237 mL, 237 mL, Oral, BID BM, Rosalin Hawking, MD, 237 mL at 12/10/17 1014 .  lactobacillus acidophilus (BACID) tablet 2 tablet, 2 tablet, Oral, TID, Costello, Mary A, NP, 2 tablet at 12/10/17 1009 .  losartan (COZAAR) tablet 50 mg, 50 mg, Oral, Daily, Costello, Mary A, NP, 50 mg at 12/10/17 1009 .  menthol-cetylpyridinium (CEPACOL) lozenge 3 mg, 1 lozenge, Oral, PRN, Kerney Elbe, MD .  ondansetron [facility]) injection 4 mg, 4 mg, Intravenous, Q6H PRN, Rosalin Hawking, MD, 4 mg at 12/03/17 1557 .  potassium chloride SA (K-DUR,KLOR-CON) CR tablet 40 mEq, 40 mEq, Oral, TID, Costello, Mary A, NP, 40 mEq at 12/10/17 1008 .  senna-docusate (Senokot-S) tablet 1 tablet, 1 tablet, Oral, QHS PRN, Amie Portland, MD .  ticagrelor (BRILINTA) tablet 90 mg, 90 mg, Oral, BID, Deveshwar, Sanjeev, MD, 90 mg at 12/10/17 1009 .  vancomycin (VANCOCIN) 50 mg/mL oral solution 125 mg, 125 mg, Oral, QID, Bajbus, Lauren D, RPH, 125 mg at 12/10/17 1000 .  Zinc Oxide (TRIPLE PASTE) 12.8 % ointment, , Topical, PRN, Rosalin Hawking, MD, 1 application at 52/77/82 4235  Patients Current Diet: Fall precautions Diet Heart Room service appropriate? Yes; Fluid consistency: Thin  Precautions / Restrictions Precautions Precautions: Fall Precaution Comments: Visual deficits (diplopia and possible R field cut) and aphasia Restrictions Weight Bearing Restrictions: No   Has the patient had 2 or more falls or a fall with injury in the past year?No  Prior Activity Level Community (5-7x/wk): Independent, driving, [place] transportation with his Microbiologist / [place] Devices/Equipment: None Home Equipment: None  Prior Device  Use: Indicate devices/aids used by the patient prior to current illness, exacerbation or injury? None of the above  Prior Functional Level Prior Function Level of Independence: Independent Comments: ADLs, IADLs, driving, and works full time and owns [employer]  Self Care: Did the patient need help bathing, dressing, using the toilet or eating?  Independent  Indoor Mobility: Did the patient need assistance with walking from room to room (with or without device)? Independent  Stairs: Did  the patient need assistance with internal or external stairs (with or without device)? Independent  Functional Cognition: Did the patient need help planning regular tasks such as shopping or remembering to take medications? Independent  Current Functional Level Cognition  Arousal/Alertness: Awake/alert Overall Cognitive Status: Impaired/Different from baseline Difficult to assess due to: Impaired communication Current Attention Level: Selective Orientation Level: Oriented X4 Following Commands: Follows one step commands consistently, Follows multi-step commands with increased time, Follows multi-step commands inconsistently Safety/Judgement: Decreased awareness of deficits General Comments: Pt continues to present with flat affect; requires increased time to initiate, sequence and complete tasks, requiring intermittent cues for sucessful task completion; questioning cues utilized to sequence through grooming tasks  Attention: Sustained Sustained Attention: Impaired Sustained Attention Impairment: Verbal basic    Extremity Assessment (includes Sensation/Coordination)  Upper Extremity Assessment: RUE deficits/detail RUE Deficits / Details: Pt with poor grasp and FM skills. With significant about of time, pt able to bring RUE to face. Poor cooridination and uses compensatory shoulder hiking RUE Coordination: decreased fine motor, decreased gross motor  Lower Extremity Assessment:  Defer to PT evaluation    ADLs  Overall ADL's : Needs assistance/impaired Eating/Feeding: Moderate assistance, Sitting Grooming: Wash/dry face, Oral care, Standing, Minimal assistance, Cueing for sequencing Grooming Details (indicate cue type and reason): MinA +2 in standing Upper Body Bathing: Moderate assistance, Sitting Lower Body Bathing: Maximal assistance, +2 for safety/equipment, Sit to/from stand Upper Body Dressing : Moderate assistance, Sitting Lower Body Dressing: Moderate assistance, Sit to/from stand Lower Body Dressing Details (indicate cue type and reason): Pt able to don/doff socks EOB with min guard assist.  Assist to pull pants over hips in standing  Toilet Transfer: Minimal assistance, Stand-pivot, BSC Toilet Transfer Details (indicate cue type and reason): Min A +2 for stand pivot to recliner. Pt requiring significant amount if time and cues Toileting- Clothing Manipulation and Hygiene: Maximal assistance, +2 for physical assistance, +2 for safety/equipment, Bed level Toileting - Clothing Manipulation Details (indicate cue type and reason): NT/RN completing peri-care after BM at bed level at start of session  Functional mobility during ADLs: Minimal assistance, +2 for safety/equipment, +2 for physical assistance General ADL Comments: Pt requires increased time to initiate and sequence standing grooming tasks, requiring verbal/questioning cues; Pt able to use RUE to assist with task completion though with increased time/effort; Pt continues to present with decreased cognition, vision, decreased dynamic balance     Mobility  Overal bed mobility: Needs Assistance Bed Mobility: Supine to Sit Supine to sit: Min guard, HOB elevated Sit to supine: Max assist, +2 for safety/equipment General bed mobility comments: HOB slightly elevated, MinGuard for safety though no physical assist needed     Transfers  Overall transfer level: Needs assistance Equipment used: Rolling  walker (2 wheeled) Transfers: Sit to/from Stand Sit to Stand: Min assist, +2 physical assistance, +2 safety/equipment Stand pivot transfers: Min assist General transfer comment: minAx2 for power up and steadying in RW, VCs for hand placement     Ambulation / Gait / Stairs / Wheelchair Mobility  Ambulation/Gait Ambulation/Gait assistance: Min assist, +2 safety/equipment Ambulation Distance (Feet): 10 Feet Assistive device: Rolling walker (2 wheeled) Gait Pattern/deviations: Step-to pattern, Wide base of support, Decreased stride length General Gait Details: minAx2 for steadying with RW, vc for proximity to RW, and increased foot clearance Gait velocity: slowed Gait velocity interpretation: Below normal speed for age/gender    Posture / Balance Dynamic Sitting Balance Sitting balance - Comments: able to don/doff socks EOB  Balance Overall balance  assessment: Needs assistance Sitting-balance support: Feet supported Sitting balance-Leahy Scale: Good Sitting balance - Comments: able to don/doff socks EOB  Postural control: Posterior lean, Right lateral lean Standing balance support: During functional activity, No upper extremity supported Standing balance-Leahy Scale: Poor Standing balance comment: required minA for standing balance during grooming tasks     Special needs/care consideration BiPAP/CPAP yes pta CPM  N/a Continuous Drip IV  N/a Dialysis  N/a Life Vest  N/a Oxygen  N/a Special Bed n/a Trach Size  N/a Wound Vac n/a Skin intact                              Bowel mgmt: incontinent due to positive cdiff/enteric precautions Bladder mgmt: incontinent; external catheter Diabetic mgmt  N/a   Previous Home Environment Living Arrangements: Spouse/significant other  Lives With: Spouse Available Help at Discharge: Family, Available 24 hours/day Type of Home: House Home Layout: One level Home Access: Stairs to enter Technical brewer of Steps: 2 Bathroom  Shower/Tub: Tub/shower unit, Multimedia programmer: Standard Bathroom Accessibility: Yes How Accessible: Accessible via walker Home Care Services: No  Discharge Living Setting Plans for Discharge Living Setting: Patient's home, Lives with (comment)(wife) Type of Home at Discharge: House Discharge Home Layout: One level Discharge Home Access: Stairs to enter Entrance Stairs-Rails: None Entrance Stairs-Number of Steps: 2 Discharge Bathroom Shower/Tub: Tub/shower unit, Walk-in shower Discharge Bathroom Toilet: Standard Discharge Bathroom Accessibility: Yes How Accessible: Accessible via walker Does the patient have any problems obtaining your medications?: No  Social/Family/Support Systems Patient Roles: Spouse, Parent(business owner) Contact Information: William Lowery, wife not his parents Anticipated Caregiver: wife Anticipated Caregiver's Contact Information: [phone] Ability/Limitations of Caregiver: wife unemployed; no limitations Caregiver Availability: 24/7 Discharge Plan Discussed with Primary Caregiver: Yes Is Caregiver In Agreement with Plan?: Yes Does Caregiver/Family have Issues with Lodging/Transportation while Pt is in Rehab?: No  Goals/Additional Needs Patient/Family Goal for Rehab: supervision with PT, OT, and SLP Expected length of stay: ELOS 13 to 20 days Additional Information: Pt's wife of 7 years and pt's Mom and pt's sister are at odds with each other(Pt placed under XXX on acute so pt's family would not visit,) Pt/Family Agrees to Admission and willing to participate: Yes Program Orientation Provided & Reviewed with Pt/Caregiver Including Roles  & Responsibilities: Yes  Barriers to Discharge: (family conflict with wife and pt's parents and sister)  Decrease burden of Care through IP rehab admission: n/a  Possible need for SNF placement upon discharge:not anticipated  Patient Condition: This patient's medical and functional status has  changed since the consult dated: 12/04/2017 in which the Rehabilitation Physician determined and documented that the patient's condition is appropriate for intensive rehabilitative care in an inpatient rehabilitation facility. See "History of Present Illness" (above) for medical update. Functional changes are: min to mod assist. Patient's medical and functional status update has been discussed with the Rehabilitation physician and patient remains appropriate for inpatient rehabilitation. Will admit to inpatient rehab today.  Preadmission Screen Completed By:  William Lowery, 12/10/2017 1:37 PM ______________________________________________________________________   Discussed status with Dr. Posey Pronto on 12/10/2017 at 1339 and received telephone approval for admission today.  Admission Coordinator:  William Lowery, time 4098 Date 12/10/2017             Cosigned by: Jamse Arn, MD at 12/10/2017 1:41 PM  Revision History

## 2017-12-10 NOTE — Progress Notes (Signed)
Meredith Staggers, MD  Physician  Physical Medicine and Rehabilitation  Consult Note  Signed  Date of Service:  12/04/2017 1:24 PM       Related encounter: ED to Hosp-Admission (Current) from 12/01/2017 in [facility]      Signed      Expand All Collapse All      [] Hide copied text  [] Hover for details        Physical Medicine and Rehabilitation Consult Reason for Consult:Decreased functional mobility Referring Physician: Dr.Xu   HPI: William Lowery is a 52 y.o.right handed male with history of hypertension, sleep apnea, obesity.Per chart review patient lives with spouse. One level home to steps to entry. Independent prior to admission.He works full time. Presented 12/01/2017 with right-sided numbness as well as intermittent headache and right eye visual blurriness. Cranial CT scan negative for hemorrhage or visible acute infarction. Small remote right inferior cerebellar infarction. CT angiogram head and neck showed left PCA occlusion beginning at its origin. Cerebral angiogram interventional radiology and underwent thrombectomy.Patient remained intubated through 12/03/2017. Follow-up MRI showed multifocal acute ischemia within the posterior circulation territories predominantly within the left PCA distribution, including left occipital lobe and left thalamus.Echocardiogram with ejection fraction of 55-60%. Lower extremity Dopplers negative. Plan is for TEE and loop recorder. Neurology consulted presently maintained on aspirin as well as Brilinta. Tolerating a regular consistency diet. Physical and occupational therapy evaluations completed with recommendations of physical medicine rehabilitation consult.   Review of Systems  Unable to perform ROS: Acuity of condition       Past Medical History:  Diagnosis Date  . Asthma    hx of  . Heart murmur    as an infant  . Hypertension   . Sleep apnea    wears CPAP        Past Surgical  History:  Procedure Laterality Date  . bellybutton procedure     as a newborn  . IR ANGIO INTRA EXTRACRAN SEL COM CAROTID INNOMINATE BILAT MOD SED  12/01/2017  . IR ANGIO VERTEBRAL SEL VERTEBRAL UNI R MOD SED  12/01/2017  . IR CT HEAD LTD  12/01/2017  . IR PERCUTANEOUS ART THROMBECTOMY/INFUSION INTRACRANIAL INC DIAG ANGIO  12/01/2017  . RADIOLOGY WITH ANESTHESIA N/A 12/01/2017   Procedure: RADIOLOGY WITH ANESTHESIA CODE STROKE;  Surgeon: Radiologist, Medication, MD;  Location: [facility];  Service: Radiology;  Laterality: N/A;        Family History  Problem Relation Age of Onset  . Colon cancer Neg Hx   . Esophageal cancer Neg Hx   . Rectal cancer Neg Hx   . Stomach cancer Neg Hx    Social History:  reports that  has never smoked. His smokeless tobacco use includes snuff. He reports that he does not drink alcohol or use drugs. Allergies: No Known Allergies          Facility-Administered Medications Prior to Admission  Medication Dose Route Frequency Provider Last Rate Last Dose  . 0.9 %  sodium chloride infusion  500 mL Intravenous Continuous Milus Banister, MD             Medications Prior to Admission  Medication Sig Dispense Refill  . B Complex Vitamins (B-COMPLEX/B-12) TABS Take 1 tablet by mouth daily.    . Cholecalciferol (VITAMIN D PO) Take by mouth daily.    Marland Kitchen losartan-hydrochlorothiazide (HYZAAR) 100-25 MG tablet Take 1 tablet by mouth daily.  3  . Omega-3 Fatty Acids (FISH OIL PO) Take  by mouth daily.    Marland Kitchen VITAMIN E PO Take by mouth daily.      Home: Home Living Family/patient expects to be discharged to:: Private residence Living Arrangements: Spouse/significant other Available Help at Discharge: Family, Available 24 hours/day Type of Home: House Home Access: Stairs to enter Technical brewer of Steps: 2 Home Layout: One level Bathroom Shower/Tub: Tub/shower unit, Multimedia programmer: Standard Home Equipment: None  Lives  With: Spouse  Functional History: Prior Function Level of Independence: Independent Comments: ADLs, IADLs, driving, and works full time and owns [employer] Functional Status:  Mobility: Bed Mobility Overal bed mobility: Needs Assistance Bed Mobility: Supine to Sit Supine to sit: Min assist, HOB elevated General bed mobility comments: min HHA to fully elevate trunk with increased time and cues to pivot from supine to sit with HOB 25degrees Transfers Overall transfer level: Needs assistance Transfers: Sit to/from Stand Sit to Stand: Min assist, +2 safety/equipment General transfer comment: min assist to stand from bed and chair with cues for hand placement, safety, increased time to achieve full knee extension and upright posture Ambulation/Gait Ambulation/Gait assistance: Min assist, +2 safety/equipment Ambulation Distance (Feet): 15 Feet Assistive device: Rolling walker (2 wheeled) Gait Pattern/deviations: Shuffle, Wide base of support General Gait Details: cues for position in RW, posture and safety with assist to direct RW and chair to follow. Pt fatigues quickly Gait velocity interpretation: Below normal speed for age/gender  ADL: ADL Overall ADL's : Needs assistance/impaired Eating/Feeding: Moderate assistance, Sitting Grooming: (P) Wash/dry face, Moderate assistance, Sitting Grooming Details (indicate cue type and reason): (P) Pt washing his face while seated at EOB. Pt with R lateral lean during activity and required tactile cues and Min physical A to correct posture. Pt initating task with L hand and switched to R hand with cues. Pt requiring increased time to bring R hand to face and demosntrating increased effort. Upper Body Bathing: (P) Moderate assistance, Sitting Lower Body Bathing: (P) Maximal assistance, +2 for safety/equipment, Sit to/from stand Upper Body Dressing : (P) Moderate assistance, Sitting Lower Body Dressing: (P) Maximal assistance, +2 for  safety/equipment, Sit to/from stand Lower Body Dressing Details (indicate cue type and reason): (P) Max A to don socks. Initating R sock over toes adn then pt bending forward to pull over his heel; demosntrating increased effort and SpO2 dropping.  Toilet Transfer: (P) Minimal assistance, +2 for physical assistance, Stand-pivot, RW, Cueing for safety, Cueing for sequencing Toilet Transfer Details (indicate cue type and reason): (P) Min A +2 for stand pivot to recliner. Pt requiring significant amount if time and cues Functional mobility during ADLs: (P) Minimal assistance, +2 for physical assistance, Cueing for sequencing, Cueing for safety, Rolling walker General ADL Comments: (P) Pt demonstrating decreased fucntional performance and is impacted by decreased funcitonal use of RUE (dominant), balance, cognition, vision, and activity tolerance. Pt motivated to particiapte in therapy  Cognition: Cognition Overall Cognitive Status: Difficult to assess Arousal/Alertness: Awake/alert Orientation Level: Oriented to person, Oriented to place, Oriented to situation, Disoriented to time(month, not year) Attention: Sustained Sustained Attention: Impaired Sustained Attention Impairment: Verbal basic Cognition Arousal/Alertness: Awake/alert Behavior During Therapy: Flat affect Overall Cognitive Status: Difficult to assess Area of Impairment: Orientation, Memory, Following commands, Safety/judgement, Problem solving, Attention, Awareness Orientation Level: Disoriented to, Time, Situation Current Attention Level: Sustained Memory: Decreased short-term memory Following Commands: Follows one step commands with increased time, Follows one step commands inconsistently Safety/Judgement: Decreased awareness of deficits, Decreased awareness of safety Awareness: Emergent Problem Solving: Slow  processing, Requires verbal cues, Requires tactile cues General Comments: Pt with decreased attention and fatigued  quickly. Required increased time for processing and needing cues to attend to questions and tasks.  Difficult to assess due to: Impaired communication  Blood pressure 121/76, pulse 88, temperature 99.3 F (37.4 C), temperature source Axillary, resp. rate 18, height 5\' 10"  (1.778 m), weight (!) 141.3 kg (311 lb 8.2 oz), SpO2 94 %. Physical Exam  Constitutional:  52 year old obese male sitting up in chair with 2 L of oxygen in place  HENT:  Head: Normocephalic.  Eyes:  Pupils sluggish to light  Neck: Normal range of motion. Neck supple. No thyromegaly present.  Cardiovascular: Normal rate and regular rhythm.  Respiratory:  Limited inspiratory effort clear to auscultation  GI: Soft. Bowel sounds are normal. He exhibits no distension.  Neurological:  Patient is lethargic but did arouse to name but remained nonverbal and would easily fall back asleep. He did on one occasion touch his nose on verbal command. Grimaces to deep palpation. Does move all 4 limbs.   Skin: Skin is warm and dry.    LabResultsLast24Hours       Results for orders placed or performed during the hospital encounter of 12/01/17 (from the past 24 hour(s))  Glucose, capillary     Status: Abnormal   Collection Time: 12/03/17  7:56 PM  Result Value Ref Range   Glucose-Capillary 133 (H) 65 - 99 mg/dL  Glucose, capillary     Status: Abnormal   Collection Time: 12/03/17 11:33 PM  Result Value Ref Range   Glucose-Capillary 115 (H) 65 - 99 mg/dL  CBC     Status: None   Collection Time: 12/04/17  2:50 AM  Result Value Ref Range   WBC 9.2 4.0 - 10.5 K/uL   RBC 4.38 4.22 - 5.81 MIL/uL   Hemoglobin 13.8 13.0 - 17.0 g/dL   HCT 39.1 39.0 - 52.0 %   MCV 89.3 78.0 - 100.0 fL   MCH 31.5 26.0 - 34.0 pg   MCHC 35.3 30.0 - 36.0 g/dL   RDW 12.8 11.5 - 15.5 %   Platelets 170 150 - 400 K/uL  Basic metabolic panel     Status: Abnormal   Collection Time: 12/04/17  2:50 AM  Result Value Ref Range   Sodium 143  135 - 145 mmol/L   Potassium 3.7 3.5 - 5.1 mmol/L   Chloride 111 101 - 111 mmol/L   CO2 23 22 - 32 mmol/L   Glucose, Bld 121 (H) 65 - 99 mg/dL   BUN 14 6 - 20 mg/dL   Creatinine, Ser 0.73 0.61 - 1.24 mg/dL   Calcium 8.3 (L) 8.9 - 10.3 mg/dL   GFR calc non Af Amer >60 >60 mL/min   GFR calc Af Amer >60 >60 mL/min   Anion gap 9 5 - 15  Phosphorus     Status: None   Collection Time: 12/04/17  2:50 AM  Result Value Ref Range   Phosphorus 2.6 2.5 - 4.6 mg/dL  Vitamin B12     Status: Abnormal   Collection Time: 12/04/17  2:50 AM  Result Value Ref Range   Vitamin B-12 1,057 (H) 180 - 914 pg/mL  TSH     Status: Abnormal   Collection Time: 12/04/17  2:50 AM  Result Value Ref Range   TSH 0.288 (L) 0.350 - 4.500 uIU/mL  Glucose, capillary     Status: Abnormal   Collection Time: 12/04/17  3:31 AM  Result Value  Ref Range   Glucose-Capillary 115 (H) 65 - 99 mg/dL  Glucose, capillary     Status: Abnormal   Collection Time: 12/04/17  8:19 AM  Result Value Ref Range   Glucose-Capillary 113 (H) 65 - 99 mg/dL   Comment 1 Notify RN    Comment 2 Document in Chart   Glucose, capillary     Status: Abnormal   Collection Time: 12/04/17 12:06 PM  Result Value Ref Range   Glucose-Capillary 111 (H) 65 - 99 mg/dL   Comment 1 Notify RN    Comment 2 Document in Chart       ImagingResults(Last48hours)  Ct Head Wo Contrast  Result Date: 12/02/2017 CLINICAL DATA:  Stroke follow-up.  24 hours post tPA administration. EXAM: CT HEAD WITHOUT CONTRAST TECHNIQUE: Contiguous axial images were obtained from the base of the skull through the vertex without intravenous contrast. COMPARISON:  Earlier today FINDINGS: Brain: Moderate Cytotoxic edema in inferior left occipital lobe that appears stable from prior. Small remote appearing right inferior and left superior cerebellar infarcts. Question small new right superior cerebellar infarct. Subarachnoid high-density in the posterior  left cerebral sulci continues to diminish. No hydrocephalus. Vascular: Distal basilar to left PCA stent. Skull: Nonspecific scalp edema dependently. Sinuses/Orbits: Progressive nasal cavity opacification. The patient is intubated with nasopharyngeal and sinus fluid levels. Bilateral sinusitis. IMPRESSION: 1. Continued decrease in subarachnoid high-density along the posterior left cerebral hemisphere. No hydrocephalus. 2. Moderate acute left occipital infarct without detected progression. 3. Question small right superior cerebellar infarct. Electronically Signed   By: Monte Fantasia M.D.   On: 12/02/2017 16:20   Mr Jodene Nam Head Wo Contrast  Result Date: 12/03/2017 CLINICAL DATA:  Stroke.  Status post tPA administration. EXAM: MRI HEAD WITHOUT CONTRAST MRA HEAD WITHOUT CONTRAST TECHNIQUE: Multiplanar, multiecho pulse sequences of the brain and surrounding structures were obtained without intravenous contrast. Angiographic images of the head were obtained using MRA technique without contrast. COMPARISON:  None. Head CT 12/02/2017 FINDINGS: MRI HEAD FINDINGS Brain: The midline structures are normal. There are multiple areas abnormal diffusion restriction. There are many scattered lesions in the cerebellum. The largest area of ischemia is in the left PCA territory. There also foci of ischemia in the left thalamus. There is multifocal cytotoxic edema corresponding to the areas of ischemia. No mass lesion, hydrocephalus, dural abnormality or extra-axial collection. There is also an old right cerebellar infarct. No age-advanced or lobar predominant atrophy. Minimal susceptibility abnormality of the left occipital lobe. There is blooming at the site of the left PCA stent. Skull and upper cervical spine: The visualized skull base, calvarium, upper cervical spine and extracranial soft tissues are normal. Sinuses/Orbits: No fluid levels or advanced mucosal thickening. No mastoid or middle ear effusion. Normal orbits. MRA  HEAD FINDINGS Intracranial internal carotid arteries: Normal. Anterior cerebral arteries: Normal. Middle cerebral arteries: Normal. Posterior communicating arteries: Present bilaterally. Posterior cerebral arteries: There is a stent within the P1 segment of the left posterior cerebral artery that extends into the mid P 2 segment. There is no flow related enhancement seen beyond the extent of the stent. Basilar artery: Normal. Vertebral arteries: Left dominant. Normal. Superior cerebellar arteries: Normal. Anterior inferior cerebellar arteries: Normal. Posterior inferior cerebellar arteries: Normal. IMPRESSION: 1. Multifocal acute ischemia within the posterior circulation territories, predominantly within the left PCA distribution, including the left occipital lobe and left thalamus. Numerous smaller foci of acute ischemia throughout both cerebellar hemispheres. 2. Minimal susceptibility blooming in the left occipital lobe, likely indicating the presence  of a minimal amount of petechial blood. 3. Stent within the P1 segment of the left PCA without flow related enhancement distal to the stent. The stent may be occluded. This could be further assessed with CTA of the head, if clinically warranted. Electronically Signed   By: Ulyses Jarred M.D.   On: 12/03/2017 03:10   Mr Brain Wo Contrast  Result Date: 12/03/2017 CLINICAL DATA:  Stroke.  Status post tPA administration. EXAM: MRI HEAD WITHOUT CONTRAST MRA HEAD WITHOUT CONTRAST TECHNIQUE: Multiplanar, multiecho pulse sequences of the brain and surrounding structures were obtained without intravenous contrast. Angiographic images of the head were obtained using MRA technique without contrast. COMPARISON:  None. Head CT 12/02/2017 FINDINGS: MRI HEAD FINDINGS Brain: The midline structures are normal. There are multiple areas abnormal diffusion restriction. There are many scattered lesions in the cerebellum. The largest area of ischemia is in the left PCA territory.  There also foci of ischemia in the left thalamus. There is multifocal cytotoxic edema corresponding to the areas of ischemia. No mass lesion, hydrocephalus, dural abnormality or extra-axial collection. There is also an old right cerebellar infarct. No age-advanced or lobar predominant atrophy. Minimal susceptibility abnormality of the left occipital lobe. There is blooming at the site of the left PCA stent. Skull and upper cervical spine: The visualized skull base, calvarium, upper cervical spine and extracranial soft tissues are normal. Sinuses/Orbits: No fluid levels or advanced mucosal thickening. No mastoid or middle ear effusion. Normal orbits. MRA HEAD FINDINGS Intracranial internal carotid arteries: Normal. Anterior cerebral arteries: Normal. Middle cerebral arteries: Normal. Posterior communicating arteries: Present bilaterally. Posterior cerebral arteries: There is a stent within the P1 segment of the left posterior cerebral artery that extends into the mid P 2 segment. There is no flow related enhancement seen beyond the extent of the stent. Basilar artery: Normal. Vertebral arteries: Left dominant. Normal. Superior cerebellar arteries: Normal. Anterior inferior cerebellar arteries: Normal. Posterior inferior cerebellar arteries: Normal. IMPRESSION: 1. Multifocal acute ischemia within the posterior circulation territories, predominantly within the left PCA distribution, including the left occipital lobe and left thalamus. Numerous smaller foci of acute ischemia throughout both cerebellar hemispheres. 2. Minimal susceptibility blooming in the left occipital lobe, likely indicating the presence of a minimal amount of petechial blood. 3. Stent within the P1 segment of the left PCA without flow related enhancement distal to the stent. The stent may be occluded. This could be further assessed with CTA of the head, if clinically warranted. Electronically Signed   By: Ulyses Jarred M.D.   On: 12/03/2017 03:10    Dg Chest Portable 1 View  Result Date: 12/03/2017 CLINICAL DATA:  Respiratory failure, intubation, history hypertension, asthma, sleep apnea, heart murmur EXAM: PORTABLE CHEST 1 VIEW COMPARISON:  Portable exam 0920 hours compared to 12/01/2017 FINDINGS: Tip of endotracheal tube projects 5.1 cm above carina. Nasogastric tube extends into stomach. Enlargement of cardiac silhouette with vascular congestion. Prominent mediastinum unchanged. Asymmetric LEFT LEFT greater than RIGHT perihilar infiltrates question asymmetric edema versus infection. No gross pleural effusion or pneumothorax. IMPRESSION: Satisfactory endotracheal tube position. Enlargement of cardiac silhouette with pulmonary vascular congestion and asymmetric pulmonary infiltrates LEFT greater than RIGHT question asymmetric pulmonary edema versus infection. Electronically Signed   By: Lavonia Dana M.D.   On: 12/03/2017 09:38     Assessment/Plan: Diagnosis: left PCA infarct 1. Does the need for close, 24 hr/day medical supervision in concert with the patient's rehab needs make it unreasonable for this patient to be served in a less  intensive setting? Yes 2. Co-Morbidities requiring supervision/potential complications: asthma, hypertension, post-stroke sequelae 3. Due to bladder management, bowel management, safety, skin/wound care, disease management, medication administration, pain management and patient education, does the patient require 24 hr/day rehab nursing? Yes 4. Does the patient require coordinated care of a physician, rehab nurse, PT (1-2 hrs/day, 5 days/week), OT (1-2 hrs/day, 5 days/week) and SLP (1-2 hrs/day, 5 days/week) to address physical and functional deficits in the context of the above medical diagnosis(es)? Yes Addressing deficits in the following areas: balance, endurance, locomotion, strength, transferring, bowel/bladder control, bathing, dressing, feeding, grooming, toileting, cognition, speech and psychosocial  support 5. Can the patient actively participate in an intensive therapy program of at least 3 hrs of therapy per day at least 5 days per week? Yes 6. The potential for patient to make measurable gains while on inpatient rehab is excellent 7. Anticipated functional outcomes upon discharge from inpatient rehab are supervision  with PT, supervision with OT, modified independent and supervision with SLP. 8. Estimated rehab length of stay to reach the above functional goals is: 13-20 days 9. Anticipated D/C setting: Home 10. Anticipated post D/C treatments: HH therapy and Outpatient therapy 11. Overall Rehab/Functional Prognosis: excellent  RECOMMENDATIONS: This patient's condition is appropriate for continued rehabilitative care in the following setting: CIR Patient has agreed to participate in recommended program. N/A Note that insurance prior authorization may be required for reimbursement for recommended care.  Comment:  Rehab Admissions Coordinator to follow up.  Thanks,  Meredith Staggers, MD, Mellody Drown   Lavon Paganini Angiulli, PA-C 12/04/2017          Revision History                        Routing History

## 2017-12-11 ENCOUNTER — Inpatient Hospital Stay (HOSPITAL_COMMUNITY): Payer: PRIVATE HEALTH INSURANCE | Admitting: Speech Pathology

## 2017-12-11 ENCOUNTER — Inpatient Hospital Stay (HOSPITAL_COMMUNITY): Payer: PRIVATE HEALTH INSURANCE | Admitting: Physical Therapy

## 2017-12-11 ENCOUNTER — Inpatient Hospital Stay (HOSPITAL_COMMUNITY): Payer: PRIVATE HEALTH INSURANCE | Admitting: Occupational Therapy

## 2017-12-11 ENCOUNTER — Encounter (HOSPITAL_COMMUNITY): Payer: Self-pay

## 2017-12-11 DIAGNOSIS — I6329 Cerebral infarction due to unspecified occlusion or stenosis of other precerebral arteries: Secondary | ICD-10-CM

## 2017-12-11 DIAGNOSIS — R270 Ataxia, unspecified: Secondary | ICD-10-CM

## 2017-12-11 DIAGNOSIS — A0472 Enterocolitis due to Clostridium difficile, not specified as recurrent: Secondary | ICD-10-CM

## 2017-12-11 LAB — COMPREHENSIVE METABOLIC PANEL
ALT: 57 U/L (ref 17–63)
ANION GAP: 13 (ref 5–15)
AST: 36 U/L (ref 15–41)
Albumin: 2.8 g/dL — ABNORMAL LOW (ref 3.5–5.0)
Alkaline Phosphatase: 54 U/L (ref 38–126)
BUN: 12 mg/dL (ref 6–20)
CO2: 23 mmol/L (ref 22–32)
Calcium: 8.7 mg/dL — ABNORMAL LOW (ref 8.9–10.3)
Chloride: 103 mmol/L (ref 101–111)
Creatinine, Ser: 0.8 mg/dL (ref 0.61–1.24)
Glucose, Bld: 96 mg/dL (ref 65–99)
POTASSIUM: 4.3 mmol/L (ref 3.5–5.1)
SODIUM: 139 mmol/L (ref 135–145)
Total Bilirubin: 1.3 mg/dL — ABNORMAL HIGH (ref 0.3–1.2)
Total Protein: 6 g/dL — ABNORMAL LOW (ref 6.5–8.1)

## 2017-12-11 LAB — CBC WITH DIFFERENTIAL/PLATELET
Basophils Absolute: 0.1 10*3/uL (ref 0.0–0.1)
Basophils Relative: 1 %
EOS ABS: 0.4 10*3/uL (ref 0.0–0.7)
EOS PCT: 4 %
HCT: 42.5 % (ref 39.0–52.0)
Hemoglobin: 14.6 g/dL (ref 13.0–17.0)
LYMPHS ABS: 1.7 10*3/uL (ref 0.7–4.0)
Lymphocytes Relative: 17 %
MCH: 30.4 pg (ref 26.0–34.0)
MCHC: 34.4 g/dL (ref 30.0–36.0)
MCV: 88.5 fL (ref 78.0–100.0)
MONO ABS: 0.6 10*3/uL (ref 0.1–1.0)
MONOS PCT: 6 %
Neutro Abs: 7.4 10*3/uL (ref 1.7–7.7)
Neutrophils Relative %: 72 %
PLATELETS: 269 10*3/uL (ref 150–400)
RBC: 4.8 MIL/uL (ref 4.22–5.81)
RDW: 13.1 % (ref 11.5–15.5)
WBC: 10.1 10*3/uL (ref 4.0–10.5)

## 2017-12-11 LAB — GLUCOSE, CAPILLARY
GLUCOSE-CAPILLARY: 80 mg/dL (ref 65–99)
GLUCOSE-CAPILLARY: 104 mg/dL — AB (ref 65–99)

## 2017-12-11 NOTE — Evaluation (Signed)
Occupational Therapy Assessment and Plan  Patient Details  Name: William Lowery MRN: [id number] Date of Birth: [birth year]  OT Diagnosis: ataxia, cognitive deficits, disturbance of vision, hemiplegia affecting dominant side, muscle weakness (generalized) and coordination disorder Rehab Potential: Rehab Potential (ACUTE ONLY): Good ELOS: 7-10 days   Today's Date: 12/11/2017 OT Individual Time: 6226-3335 OT Individual Time Calculation (min): 87 min     Problem List:  Patient Active Problem List   Diagnosis Date Noted  . Cerebrovascular accident (CVA) due to occlusion of left posterior communicating artery ([place]) 12/10/2017  . Cerebrovascular accident (CVA) due to thrombosis of left posterior cerebral artery ([place])   . Benign essential HTN   . Morbid obesity ([place])   . OSA (obstructive sleep apnea)   . Enteritis due to Clostridium difficile   . Acute CVA (cerebrovascular accident) ([place]) 12/01/2017  . Acute arterial ischemic stroke, multifocal, posterior circulation ([place]) 12/01/2017    Past Medical History:  Past Medical History:  Diagnosis Date  . Asthma    hx of  . Heart murmur    as an infant  . Hypertension   . Sleep apnea    wears CPAP   Past Surgical History:  Past Surgical History:  Procedure Laterality Date  . bellybutton procedure     as a newborn  . IR ANGIO INTRA EXTRACRAN SEL COM CAROTID INNOMINATE BILAT MOD SED  12/01/2017  . IR ANGIO VERTEBRAL SEL VERTEBRAL UNI R MOD SED  12/01/2017  . IR CT HEAD LTD  12/01/2017  . IR INTRA CRAN STENT  12/01/2017  . IR PERCUTANEOUS ART THROMBECTOMY/INFUSION INTRACRANIAL INC DIAG ANGIO  12/01/2017  . RADIOLOGY WITH ANESTHESIA N/A 12/01/2017   Procedure: RADIOLOGY WITH ANESTHESIA CODE STROKE;  Surgeon: Radiologist, Medication, MD;  Location: [facility];  Service: Radiology;  Laterality: N/A;  . TEE WITHOUT CARDIOVERSION N/A 12/06/2017   Procedure: TRANSESOPHAGEAL ECHOCARDIOGRAM (TEE);  Surgeon: Pixie Casino, MD;  Location: [facility] ENDOSCOPY;   Service: Cardiovascular;  Laterality: N/A;    Assessment & Plan Clinical Impression: Patient is a 52 y.o. year old male with history of hypertension, sleep apnea, obesity. Per chart review patient lives with spouse. One level home two steps to entry. Independent prior to admission.He works full time and self-employed with [employer]. Presented 12/01/2017 with right-sided numbness as well as intermittent headache and right eye visual blurriness. Cranial CT scan reviewed, unremarkable for acute intracranial process. Per report, small remote right inferior cerebellar infarction. CT angiogram head and neck showed left PCA occlusion beginning at its origin. Cerebral angiogram interventional radiology and underwent thrombectomy.Patient remained intubated through 12/03/2017. Follow-up MRI showed multifocal acute ischemia within the posterior circulation territories predominantly within the left PCA distribution, including left occipital lobe and left thalamus.Echocardiogram with ejection fraction of 55-60%. Lower extremity Dopplers negative. TEE/loop recorder placement 12/06/2017 showed ejection fraction of 70%.. No thrombus and negative for PFO. 0.51.0 cm calcified mass adjacent to the [place] that restricts movement question vegetation with CT angiogram of the chest completed for confirmation showing a 4.3 cm ascending thoracic aortic aneurysm and recommendations for annual imaging follow-up by CTA or MRA. Neurology consulted presently maintained on aspirin as well as Brilinta. Tolerating a regular consistency diet. Bouts of hypokalemia with supplement added. C. difficile specimen 12/06/2017 positive placed on vancomycin orally 125 mg 4 times a day 14 days as recommended by infectious disease. Latest blood culture 12/07/2017 no growth. Physical and occupational therapy evaluations completed with recommendations of physical medicine rehabilitation consult. Patient was admitted for a  comprehensive  rehabilitation program  Patient transferred to CIR on 12/10/2017 .    Patient currently requires min - mod A with basic self-care skills secondary to muscle weakness, decreased cardiorespiratoy endurance, ataxia, decreased coordination and decreased motor planning, decreased awareness, decreased problem solving and decreased safety awareness and decreased sitting balance, decreased standing balance, hemiplegia and decreased balance strategies.  Prior to hospitalization, patient could complete ADLs and IADLs with independent .  Patient will benefit from skilled intervention to decrease level of assist with basic self-care skills prior to discharge home with care partner.  Anticipate patient will require 24 hour supervision and follow up outpatient.  OT - End of Session Activity Tolerance: Decreased this session Endurance Deficit: Yes Endurance Deficit Description: mutliple rest breaks with self care tasks OT Assessment Rehab Potential (ACUTE ONLY): Good OT Barriers to Discharge: Other (comments) OT Barriers to Discharge Comments: none known at this time OT Patient demonstrates impairments in the following area(s): Balance;Cognition;Endurance;Safety;Vision OT Basic ADL's Functional Problem(s): Grooming;Bathing;Dressing;Toileting OT Transfers Functional Problem(s): Toilet;Tub/Shower OT Additional Impairment(s): Fuctional Use of Upper Extremity OT Plan OT Intensity: Minimum of 1-2 x/day, 45 to 90 minutes OT Frequency: 5 out of 7 days OT Duration/Estimated Length of Stay: 7-10 days OT Treatment/Interventions: Balance/vestibular training;Discharge planning;Pain management;Functional electrical stimulation;Self Care/advanced ADL retraining;Therapeutic Activities;UE/LE Coordination activities;Cognitive remediation/compensation;Functional mobility training;Patient/family education;Therapeutic Exercise;Visual/perceptual remediation/compensation;Community reintegration;DME/adaptive equipment  instruction;Neuromuscular re-education;Psychosocial support;UE/LE Strength taining/ROM OT Self Feeding Anticipated Outcome(s): supervision OT Basic Self-Care Anticipated Outcome(s): mod I - supervision OT Toileting Anticipated Outcome(s): supervision OT Bathroom Transfers Anticipated Outcome(s): supervision OT Recommendation Recommendations for Other Services: Neuropsych consult;Therapeutic Recreation consult Therapeutic Recreation Interventions: Pet therapy Patient destination: Home Follow Up Recommendations: 24 hour supervision/assistance;Home health OT Equipment Recommended: To be determined   Skilled Therapeutic Intervention Upon entering the room, pt seated in wheelchair with wife present in room. Pt with no c/o pain this session. OT educated pt and caregiver on OT purpose, POC, and goals with them verbalizing understanding. Pt ambulating 10' with RW and min A to bathroom for toileting. Close supervision for safety and min A for balance with clothing management and hygiene. Pt transferred onto TTB for bathing at shower level. Pt utilized R UE to wash parts of body with verbal cues and encouragement. Pt dressing from wheelchair level with min A for sit <>stand. Pt standing at sink for grooming tasks with min A for balance and min verbal guidance cues for problem solving. Pt returning to wheelchair at end of session with call bell and all needed items within reach.   OT Evaluation Precautions/Restrictions  Precautions Precautions: Fall Restrictions Weight Bearing Restrictions: No Pain Pain Assessment Pain Assessment: No/denies pain Home Living/Prior Functioning Home Living Available Help at Discharge: Family, Available 24 hours/day Type of Home: House Home Access: Stairs to enter [employer] of Steps: 2 Entrance Stairs-Rails: None Home Layout: One level Bathroom Shower/Tub: Tub/shower unit, Multimedia programmer: Programmer, systems: Yes  Lives  With: Spouse Prior Function Level of Independence: Independent with basic ADLs, Independent with transfers, Independent with homemaking with ambulation, Independent with gait  Able to Take Stairs?: Yes Driving: Yes Vocation: Full time employment Leisure: Hobbies-yes (Comment) Comments: collect knives Vision Baseline Vision/History: Wears glasses Wears Glasses: Reading only Patient Visual Report: (Caregiver reports R visual cut) Visual Fields: Right superior homonymous quadranopsia Additional Comments: however, not noticing with functional tasks during evaluation Cognition Overall Cognitive Status: Impaired/Different from baseline Arousal/Alertness: Awake/alert Orientation Level: Person Year: 2019 Month: February Day of Week: Correct Memory: Appears intact Immediate Memory  Recall: Sock;Blue;Bed Memory Recall: Sock;Blue Memory Recall Sock: Without Cue Memory Recall Blue: Without Cue Attention: Sustained Sustained Attention: Appears intact Sustained Attention Impairment: Verbal basic Awareness: Impaired Awareness Impairment: Intellectual impairment Problem Solving: Impaired Problem Solving Impairment: Functional basic Safety/Judgment: Impaired Sensation Sensation Light Touch: Appears Intact Coordination Gross Motor Movements are Fluid and Coordinated: No Fine Motor Movements are Fluid and Coordinated: No Motor  Motor Motor: Hemiplegia Motor - Skilled Clinical Observations: R hemiplegia, general weakness Mobility  Transfers Transfers: Sit to Stand;Stand to Sit Sit to Stand: 4: Min assist Stand to Sit: 4: Min assist   Balance Balance Balance Assessed: Yes Dynamic Sitting Balance Dynamic Sitting - Balance Support: Feet supported Dynamic Sitting - Level of Assistance: 5: Stand by assistance Dynamic Standing Balance Dynamic Standing - Balance Support: During functional activity Dynamic Standing - Level of Assistance: 4: Min assist Extremity/Trunk Assessment RUE  Assessment RUE Assessment: Exceptions to WFL(3-/5 throughout) LUE Assessment LUE Assessment: Within Functional Limits   See Function Navigator for Current Functional Status.   Refer to Care Plan for Long Term Goals  Recommendations for other services: Neuropsych and Therapeutic Recreation  Pet therapy   Discharge Criteria: Patient will be discharged from OT if patient refuses treatment 3 consecutive times without medical reason, if treatment goals not met, if there is a change in medical status, if patient makes no progress towards goals or if patient is discharged from hospital.  The above assessment, treatment plan, treatment alternatives and goals were discussed and mutually agreed upon: by patient and by family  Gypsy Decant 12/11/2017, 4:53 PM

## 2017-12-11 NOTE — Evaluation (Addendum)
Physical Therapy Assessment and Plan  Patient Details  Name: William Lowery MRN: [id number] Date of Birth: 1966/09/13  PT Diagnosis: Abnormality of gait, Cognitive deficits, Coordination disorder, Difficulty walking, Hemiparesis dominant, Impaired cognition and Muscle weakness Rehab Potential: Good ELOS: 7-10 days   Today's Date: 12/11/2017 PT Individual Time: 2446-2863 PT Individual Time Calculation (min): 72 min    Problem List:  Patient Active Problem List   Diagnosis Date Noted  . Cerebrovascular accident (CVA) due to occlusion of left posterior communicating artery ([place]) 12/10/2017  . Cerebrovascular accident (CVA) due to thrombosis of left posterior cerebral artery ([place])   . Benign essential HTN   . Morbid obesity ([place])   . OSA (obstructive sleep apnea)   . Enteritis due to Clostridium difficile   . Acute CVA (cerebrovascular accident) ([place]) 12/01/2017  . Acute arterial ischemic stroke, multifocal, posterior circulation ([place]) 12/01/2017    Past Medical History:  Past Medical History:  Diagnosis Date  . Asthma    hx of  . Heart murmur    as an infant  . Hypertension   . Sleep apnea    wears CPAP   Past Surgical History:  Past Surgical History:  Procedure Laterality Date  . bellybutton procedure     as a newborn  . IR ANGIO INTRA EXTRACRAN SEL COM CAROTID INNOMINATE BILAT MOD SED  12/01/2017  . IR ANGIO VERTEBRAL SEL VERTEBRAL UNI R MOD SED  12/01/2017  . IR CT HEAD LTD  12/01/2017  . IR INTRA CRAN STENT  12/01/2017  . IR PERCUTANEOUS ART THROMBECTOMY/INFUSION INTRACRANIAL INC DIAG ANGIO  12/01/2017  . RADIOLOGY WITH ANESTHESIA N/A 12/01/2017   Procedure: RADIOLOGY WITH ANESTHESIA CODE STROKE;  Surgeon: Radiologist, Medication, MD;  Location: [facility];  Service: Radiology;  Laterality: N/A;  . TEE WITHOUT CARDIOVERSION N/A 12/06/2017   Procedure: TRANSESOPHAGEAL ECHOCARDIOGRAM (TEE);  Surgeon: Pixie Casino, MD;  Location: [facility] ENDOSCOPY;  Service: Cardiovascular;   Laterality: N/A;    Assessment & Plan Clinical Impression: Patient is a 52 y.o. year old male with history of hypertension, sleep apnea, obesity. Per chart review patient lives with spouse. One level home two steps to entry. Independent prior to admission.He works full time and self-employed with [employer]. Presented 12/01/2017 with right-sided numbness as well as intermittent headache and right eye visual blurriness. Cranial CT scan reviewed, unremarkable for acute intracranial process. Per report, small remote right inferior cerebellar infarction. CT angiogram head and neck showed left PCA occlusion beginning at its origin. Cerebral angiogram interventional radiology and underwent thrombectomy.Patient remained intubated through 12/03/2017. Follow-up MRI showed multifocal acute ischemia within the posterior circulation territories predominantly within the left PCA distribution, including left occipital lobe and left thalamus.Echocardiogram with ejection fraction of 55-60%. Lower extremity Dopplers negative. TEE/loop recorder placement 12/06/2017 showed ejection fraction of 70%.. No thrombus and negative for PFO. 0.51.0 cm calcified mass adjacent to the [place] that restricts movement question vegetation with CT angiogram of the chest completed for confirmation showing a 4.3 cm ascending thoracic aortic aneurysm and recommendations for annual imaging follow-up by CTA or MRA. Neurology consulted presently maintained on aspirin as well as Brilinta. Tolerating a regular consistency diet. Bouts of hypokalemia with supplement added. C. difficile specimen 12/06/2017 positive placed on vancomycin orally 125 mg 4 times a day 14 days as recommended by infectious disease. Latest blood culture 12/07/2017 no growth. Physical and occupational therapy evaluations completed with recommendations of physical medicine rehabilitation consult. Patient was admitted for a comprehensive rehabilitation program.  Patient  transferred to CIR on 12/10/2017 .   Patient currently requires independent  with mobility secondary to muscle weakness, decreased cardiorespiratoy endurance, decreased coordination, decreased visual acuity, decreased attention to right, decreased problem solving, decreased safety awareness and delayed processing, and decreased standing balance, decreased postural control, hemiplegia and decreased balance strategies.  Prior to hospitalization, patient was independent  with mobility and lived with Spouse in a House home.  Home access is 2Stairs to enter.  Patient will benefit from skilled PT intervention to maximize safe functional mobility, minimize fall risk and decrease caregiver burden for planned discharge home with 24 hour supervision.  Anticipate patient will benefit from follow up OP at discharge.  PT - End of Session Activity Tolerance: Tolerates 30+ min activity with multiple rests Endurance Deficit: Yes Endurance Deficit Description: 2/2 fatigue, decreased endurance PT Assessment Rehab Potential (ACUTE/IP ONLY): Good PT Patient demonstrates impairments in the following area(s): Balance;Safety;Behavior;Sensory;Endurance;Motor;Perception PT Transfers Functional Problem(s): Bed Mobility;Bed to Chair;Car;Furniture PT Locomotion Functional Problem(s): Ambulation;Wheelchair Mobility;Stairs PT Plan PT Intensity: Minimum of 1-2 x/day ,45 to 90 minutes PT Frequency: 5 out of 7 days PT Duration Estimated Length of Stay: 7-10 days PT Treatment/Interventions: Ambulation/gait training;Community reintegration;DME/adaptive equipment instruction;Neuromuscular re-education;Psychosocial support;Stair training;UE/LE Strength taining/ROM;Wheelchair propulsion/positioning;UE/LE Coordination activities;Therapeutic Activities;Pain management;Functional electrical stimulation;Discharge planning;Balance/vestibular training;Cognitive remediation/compensation;Disease management/prevention;Functional mobility  training;Patient/family education;Splinting/orthotics;Therapeutic Exercise;Visual/perceptual remediation/compensation PT Transfers Anticipated Outcome(s): supervision with LRAD PT Locomotion Anticipated Outcome(s): supervision with LRAD PT Recommendation Recommendations for Other Services: Speech consult;Therapeutic Recreation consult Therapeutic Recreation Interventions: Pet therapy;Stress management;Outing/community reintergration Follow Up Recommendations: Outpatient PT;24 hour supervision/assistance Patient destination: Home Equipment Recommended: Rolling walker with 5" wheels  Skilled Therapeutic Intervention Pt received in w/c with wife present for session. Pt without c/o pain. Therapist educated pt on ELOS, weekly interdisciplinary team meeting, daily therapy schedule, safety while in room, and other various CIR information with pt & wife voicing understanding. Pt is able to ambulate 10 ft without AD & min assist and 90 ft with RW & steady<>min assist. Pt with impaired coordination RUE & RLE and with intermittent jerking in extremities. Pt appears to have impaired proprioception in RUE/LE and decreased attention to R. Pt also limited by language deficits and impaired problem solving. Pt overall at min assist level with RW; please see below for details. At end of session pt reported need to use restroom and required max cuing for sequencing to turn and sit on toilet; pt with continent void. Pt required max assist for donning clean brief, shorts and shoes. Standing at sink with steady assist pt requires cuing to square up to sink and for proper use of soap dispenser. At end of session pt left sitting in w/c with wife present to supervise & all needs within reach.  Addendum: When negotiating stairs pt with c/o lightheadedness when at top, after activity pt reported he was okay.  PT Evaluation Precautions/Restrictions Precautions Precautions: Fall Restrictions Weight Bearing Restrictions:  No  General Chart Reviewed: Yes Additional Pertinent History: HTN, obesity, asthma, heart murmur, sleep apnea Response to Previous Treatment: Patient with no complaints from previous session. Family/Caregiver Present: Yes(wife [name])  Vital Signs Therapy Vitals Temp: 99.1 F (37.3 C) Temp Source: Oral Pulse Rate: 90 BP: 120/84 Patient Position (if appropriate): Sitting Oxygen Therapy SpO2: 96 % O2 Device: Not Delivered   Pain Pain Assessment Pain Assessment: No/denies pain  Home Living/Prior Functioning Home Living Available Help at Discharge: Family;Available 24 hours/day Type of Home: House Home Access: Stairs to enter [employer] of Steps: 2 Entrance Stairs-Rails: None Home  Layout: One level  Lives With: Spouse Prior Function Level of Independence: Independent with basic ADLs;Independent with transfers;Independent with homemaking with ambulation;Independent with gait  Able to Take Stairs?: Yes Driving: Yes Vocation: Full time employment Leisure: Hobbies-yes (Comment) Comments: collect knives   Vision/Perception  Pt reports he wears glasses for reading only PTA. Pt reports R visual field cut in R eye and decreased peripheral vision in L eye.  Cognition Overall Cognitive Status: Impaired/Different from baseline Arousal/Alertness: Awake/alert Orientation Level: Oriented X4 Awareness: Impaired Problem Solving: Impaired Problem Solving Impairment: Functional basic Safety/Judgment: Impaired   Sensation Sensation Light Touch: (pt reports tingling in R hand) Coordination Gross Motor Movements are Fluid and Coordinated: No Fine Motor Movements are Fluid and Coordinated: No   Motor  Motor Motor: Hemiplegia Motor - Skilled Clinical Observations: R hemiplegia, general weakness   Mobility Transfers Transfers: Yes Sit to Stand: 4: Min assist;With armrests Sit to Stand Details: Verbal cues for sequencing;Verbal cues for technique Stand to Sit:  4: Min assist;With armrests Stand to Sit Details (indicate cue type and reason): Verbal cues for sequencing;Verbal cues for technique  Locomotion  Ambulation Ambulation: Yes Ambulation/Gait Assistance: 4: Min assist Ambulation Distance (Feet): 10 Feet Assistive device: None Gait Gait: Yes Gait Pattern: Decreased step length - right;Decreased step length - left(decreased gait speed, impaired balance) Stairs / Additional Locomotion Stairs: Yes Stairs Assistance: 4: Min assist Stairs Assistance Details: Verbal cues for technique;Verbal cues for sequencing Stair Management Technique: Two rails Number of Stairs: 4 Height of Stairs: 6(inches) Ramp: 4: Min assist(ambulatory with RW) Wheelchair Mobility Wheelchair Mobility: No   Balance Balance Balance Assessed: Yes Dynamic Standing Balance Dynamic Standing - Balance Support: No upper extremity supported Dynamic Standing - Level of Assistance: 4: Min assist Dynamic Standing - Balance Activities: (during gait without AD)  Extremity Assessment  RLE Assessment RLE Assessment: Within Functional Limits LLE Assessment LLE Assessment: Within Functional Limits   See Function Navigator for Current Functional Status.   Refer to Care Plan for Long Term Goals  Recommendations for other services: Therapeutic Recreation  Pet therapy, Stress management and Outing/community reintegration  Discharge Criteria: Patient will be discharged from PT if patient refuses treatment 3 consecutive times without medical reason, if treatment goals not met, if there is a change in medical status, if patient makes no progress towards goals or if patient is discharged from hospital.  The above assessment, treatment plan, treatment alternatives and goals were discussed and mutually agreed upon: by patient and by family  [place] 12/11/2017, 5:15 PM

## 2017-12-11 NOTE — Progress Notes (Signed)
[facility] PHYSICAL MEDICINE & REHABILITATION     PROGRESS NOTE    Subjective/Complaints: States that he had a reasonable night.  Was able to get sleep.  Denies pain or headache.  ROS: pt denies nausea, vomiting, diarrhea, cough, shortness of breath or chest pain   Objective: Vital Signs: Blood pressure 128/88, pulse 81, temperature 97.8 F (36.6 C), temperature source Axillary, resp. rate 20, weight 131.4 kg (289 lb 11 oz), SpO2 95 %. No results found. Recent Labs    12/11/17 0610  WBC 10.1  HGB 14.6  HCT 42.5  PLT 269   Recent Labs    12/10/17 0421 12/11/17 0610  NA 139 139  K 3.6 4.3  CL 107 103  GLUCOSE 84 96  BUN 11 12  CREATININE 0.76 0.80  CALCIUM 8.5* 8.7*   CBG (last 3)  Recent Labs    12/10/17 1639 12/10/17 2154 12/11/17 0639  GLUCAP 106* 106* 80    Wt Readings from Last 3 Encounters:  12/11/17 131.4 kg (289 lb 11 oz)  12/10/17 (!) 138.3 kg (304 lb 14.3 oz)  05/21/17 (!) 140.2 kg (309 lb)    Physical Exam:  Constitutional: He appears well-developed.  52 year old right-handed male NAD  HENT:  Head: Normocephalic and atraumatic.  Eyes: EOM are normal. Right eye exhibits no discharge. Left eye exhibits no discharge.  Pupils reactive to light. No nystagmus  Neck: Normal range of motion. Neck supple. No thyromegaly present.  Cardiovascular: RRR without murmur. No JVD  Respiratory: CTA Bilaterally without wheezes or rales. Normal effort  GI: Soft. Bowel sounds are normal. He exhibits no distension.  Musculoskeletal:  No edema or tenderness in extremities  Neurological: He is alert.      Able to communicate thoughts and ideas fairly appropriately Followed simple commands.  He was able to provide his name. Visual fields appear to be intact in all 4 quadrants although daily and depth perception seem to be impaired. Motor: RUE: 4/5 proximal to distal with ataxia RLE: 4/5 proximal to distal with ataxia LUE: 5/5 proximal to distal LLE: 4+ to 5/5  proximal to distal  Skin: Skin is warm and dry.  Vascular changes b/l LE  Psychiatric: His affect is blunt. His speech is delayed. He is slowed.  Confused     Assessment/Plan: 1.  Functional and mobility deficits secondary to left PCA infarction which require 3+ hours per day of interdisciplinary therapy in a comprehensive inpatient rehab setting. Physiatrist is providing close team supervision and 24 hour management of active medical problems listed below. Physiatrist and rehab team continue to assess barriers to discharge/monitor patient progress toward functional and medical goals.  Function:  Bathing Bathing position      Bathing parts      Bathing assist        Upper Body Dressing/Undressing Upper body dressing                    Upper body assist        Lower Body Dressing/Undressing Lower body dressing                                  Lower body assist        Toileting Toileting          Toileting assist     Transfers Chair/bed transfer             Locomotion Ambulation  Wheelchair          Cognition Comprehension    Expression    Social Interaction    Problem Solving    Memory     Medical Problem List and Plan: 1.  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  DVT Prophylaxis/Anticoagulation: Brilinta. Venous Doppler studies negative 3. Pain Management: Tylenol as needed 4. Mood: Provide emotional support 5. Neuropsych: This patient is not yet fully capable of making decisions on his own behalf. 6. Skin/Wound Care: Routine skin checks 7. Fluids/Electrolytes/Nutrition: Labs collected this morning and essentially within normal limits except for low albumin.  Encourage p.o. intake.  Patient does not appear malnourished 4.History of hypertension. Cozaar 50 mg daily   -Blood pressure  under control at present 9.Morbid obesity. BMI 44.7.Dietary follow-up 10.OSA. Patient on CPAP at home 11. Clostridium difficile. Contact precautions. Vancomycin 125 mg 4 times daily 12/06/2017 for 14 days    -Stool consistency improving  LOS (Days) 1 A FACE TO FACE EVALUATION WAS PERFORMED  Meredith Staggers, MD 12/11/2017 8:36 AM

## 2017-12-11 NOTE — Evaluation (Signed)
Speech Language Pathology Assessment and Plan  Patient Details  Name: William Lowery MRN: [id number] Date of Birth: 1966/07/24  SLP Diagnosis: Aphasia;Cognitive Impairments  Rehab Potential: Excellent ELOS: 12/19/2017    Today's Date: 12/11/2017 SLP Individual Time: 0258-5277 SLP Individual Time Calculation (min): 60 min   Problem List:  Patient Active Problem List   Diagnosis Date Noted  . Cerebrovascular accident (CVA) due to occlusion of left posterior communicating artery ([place]) 12/10/2017  . Cerebrovascular accident (CVA) due to thrombosis of left posterior cerebral artery ([place])   . Benign essential HTN   . Morbid obesity ([place])   . OSA (obstructive sleep apnea)   . Enteritis due to Clostridium difficile   . Acute CVA (cerebrovascular accident) ([place]) 12/01/2017  . Acute arterial ischemic stroke, multifocal, posterior circulation ([place]) 12/01/2017   Past Medical History:  Past Medical History:  Diagnosis Date  . Asthma    hx of  . Heart murmur    as an infant  . Hypertension   . Sleep apnea    wears CPAP   Past Surgical History:  Past Surgical History:  Procedure Laterality Date  . bellybutton procedure     as a newborn  . IR ANGIO INTRA EXTRACRAN SEL COM CAROTID INNOMINATE BILAT MOD SED  12/01/2017  . IR ANGIO VERTEBRAL SEL VERTEBRAL UNI R MOD SED  12/01/2017  . IR CT HEAD LTD  12/01/2017  . IR INTRA CRAN STENT  12/01/2017  . IR PERCUTANEOUS ART THROMBECTOMY/INFUSION INTRACRANIAL INC DIAG ANGIO  12/01/2017  . RADIOLOGY WITH ANESTHESIA N/A 12/01/2017   Procedure: RADIOLOGY WITH ANESTHESIA CODE STROKE;  Surgeon: Radiologist, Medication, MD;  Location: [facility];  Service: Radiology;  Laterality: N/A;  . TEE WITHOUT CARDIOVERSION N/A 12/06/2017   Procedure: TRANSESOPHAGEAL ECHOCARDIOGRAM (TEE);  Surgeon: Pixie Casino, MD;  Location: [facility] ENDOSCOPY;  Service: Cardiovascular;  Laterality: N/A;    Assessment / Plan / Recommendation Clinical Impression Patient is a 52 y.o.right  handedmalewith history of hypertension, sleep apnea, obesity. Per chart review patient lives with spouse. One level home two steps to entry. Independent prior to admission.He works full time and self-employed with [employer]. Presented 12/01/2017 with right-sided numbness as well as intermittent headache and right eye visual blurriness. Cranial CT scan reviewed, unremarkable for acute intracranial process. Per report, small remote right inferior cerebellar infarction. CT angiogram head and neck showed left PCA occlusion beginning at its origin. Cerebral angiogram interventional radiology and underwent thrombectomy.Patient remained intubated through 12/03/2017. Follow-up MRI showed multifocal acute ischemia within the posterior circulation territories predominantly within the left PCA distribution, including left occipital lobe and left thalamus.Echocardiogram with ejection fraction of 55-60%. Lower extremity Dopplers negative. TEE/loop recorder placement 12/06/2017 showed ejection fraction of 70%.. No thrombus and negative for PFO. 0.51.0 cm calcified mass adjacent to the [place] that restricts movement question vegetation with CT angiogram of the chest completed for confirmation showing a 4.3 cm ascending thoracic aortic aneurysm and recommendations for annual imaging follow-up by CTA or MRA. Neurology consulted presently maintained on aspirin as well as Brilinta. Tolerating a regular consistency diet. Bouts of hypokalemia with supplement added. C. difficile specimen 12/06/2017 positive placed on vancomycin orally 125 mg 4 times a day 14 days as recommended by infectious disease. Latest blood culture 12/07/2017 no growth. Physical and occupational therapy evaluations completed with recommendations of physical medicine rehabilitation consult. Patient was admitted for a comprehensive rehabilitation program 12/10/2017.  Pt demonstrates a mild aphasia impacting verbal expression and written expression,  which impacts the  pt's overall ability with functional tasks. Pt's speech is characterized by hesitations and word-finding deficits, especially with complex verbal tasks. Although patient is unable to verbalize any cognitive-linguistic impairments, patient is able to self-monitor and correct verbal errors throughout functional tasks. Pt independently demonstrates sustained attention for ~60 minutes; however, pt presents with difficulties in problem solving with functional tasks. Patient would benefit from skilled SLP intervention to maximize his cognitive-linguistic function and overall functional independence prior to discharge.     Skilled Therapeutic Interventions          Administered [place] Aphasia Battery - Bedside. Please see above for details. Educated patient in regards to current cognitive-linguistic impairments and goals of skilled SLP intervention. Patient verbalized understanding and agreement.    SLP Assessment  Patient will need skilled [facility] during CIR admission    Recommendations  Oral Care Recommendations: Oral care BID Patient destination: Home Follow up Recommendations: Outpatient SLP;24 hour supervision/assistance Equipment Recommended: None recommended by SLP    SLP Frequency 3 to 5 out of 7 days   SLP Duration  SLP Intensity  SLP Treatment/Interventions 12/19/2017  Minumum of 1-2 x/day, 30 to 90 minutes  Cognitive remediation/compensation;Speech/Language facilitation;Functional tasks;Cueing hierarchy;Therapeutic Activities;Patient/family education    Pain No/denies pain    Function:  Eating Eating   Modified Consistency Diet: No Eating Assist Level: Set up assist for           Cognition Comprehension Comprehension assist level: Understands complex 90% of the time/cues 10% of the time  Expression   Expression assist level: Expresses basic 75 - 89% of the time/requires cueing 10 - 24% of the time. Needs helper to occlude  trach/needs to repeat words.  Social Interaction Social Interaction assist level: Interacts appropriately with others with medication or extra time (anti-anxiety, antidepressant).  Problem Solving Problem solving assist level: Solves basic 50 - 74% of the time/requires cueing 25 - 49% of the time  Memory Memory assist level: More than reasonable amount of time   Short Term Goals: Week 1: SLP Short Term Goal 1 (Week 1): STGs = LTGs due to shorth length of stay  Refer to Care Plan for Long Term Goals  Recommendations for other services: None   Discharge Criteria: Patient will be discharged from SLP if patient refuses treatment 3 consecutive times without medical reason, if treatment goals not met, if there is a change in medical status, if patient makes no progress towards goals or if patient is discharged from hospital.  The above assessment, treatment plan, treatment alternatives and goals were discussed and mutually agreed upon: by patient  Meredeth Ide  SLP - Student 12/11/2017, 3:57 PM

## 2017-12-11 NOTE — Patient Care Conference (Addendum)
Inpatient RehabilitationTeam Conference and Plan of Care Update Date: 12/11/2017   Time: 2:50 PM    Patient Name: William Lowery      Medical Record Number: [id number]  Date of Birth: 1966-02-01 Sex: Male         Room/Bed: 4W07C/4W07C-01 Payor Info: Payor: [employer]IC [employer]ERCIAL / Plan: [employer]IC COMMERCIAL / Product Type: *No Product type* /    Admitting Diagnosis: L CVA  Admit Date/Time:  12/10/2017  5:42 PM Admission Comments: No comment available   Primary Diagnosis:  <principal problem not specified> Principal Problem: <principal problem not specified>  Patient Active Problem List   Diagnosis Date Noted  . Cerebrovascular accident (CVA) due to occlusion of left posterior communicating artery ([place]) 12/10/2017  . Cerebrovascular accident (CVA) due to thrombosis of left posterior cerebral artery ([place])   . Benign essential HTN   . Morbid obesity ([place])   . OSA (obstructive sleep apnea)   . Enteritis due to Clostridium difficile   . Acute CVA (cerebrovascular accident) ([place]) 12/01/2017  . Acute arterial ischemic stroke, multifocal, posterior circulation ([place]) 12/01/2017    Expected Discharge Date: Expected Discharge Date: 12/19/17  Team Members Present: Physician leading conference: Dr. Alger Simons Social Worker Present: Lennart Pall, LCSW PT Present: Lavone Nian, PT OT Present: Benay Pillow, OT SLP Present: Weston Anna, SLP PPS Coordinator present : Daiva Nakayama, RN, CRRN     Current Status/Progress Goal Weekly Team Focus  Medical   Left PCA infarct with right-sided weakness and ataxia as well as visual spatial deficits.  C. difficile enteritis  Improve balance and ability to perform  Blood pressure control, treatment of C. difficile   Bowel/Bladder   incontinent at times of both, LBM 2/4, on enteric precautions   regain continence  timed toileting   Swallow/Nutrition/ Hydration             ADL's   mod I bathing at shower level, min A overall for functional transfers  and UB/LB self care  mod  I - grooming and sitting balance, Supervision overall   R UE NMR, self care retraining, balance, endurance, functional transfers, pt/famiy edu   Mobility   min assist with RW  supervision with LRAD; min assist stair negotiation with LRAD without rails  pt/family education, R NMR, balance, gait, transfers, cognitive remediation   Communication   Min A  Supervision  increase word-finding, improve written expression   Safety/Cognition/ Behavioral Observations  Min A  Supervision  problem solving, awareness    Pain   no c/o pain  pain scale <4  assess & treat as needed   Skin   MASD buttocks, lt groin thrombectomy site  no new skin break down  assess skin q shift    Rehab Goals Patient on target to meet rehab goals: Yes *See Care Plan and progress notes for long and short-term goals.     Barriers to Discharge  Current Status/Progress Possible Resolutions Date Resolved   Physician    Medical stability        None identified at this point      Nursing                  PT                    OT Other (comments)  none known at this time             SLP  SW                Discharge Planning/Teaching Needs:  Plan home with wife to provide 24/7 assistance.    Teaching to be planned with wife prior to d/c.   Team Discussion:  New eval.  Suspicious for visual deficits,however, did not appear to affect functional tasks.problem solving and awareness required minimal assistance.  Mild aphasia and some decreased coordination.  Minimum assistance overall with OT on eval.  PT eval pending.  Revisions to Treatment Plan:  None    Continued Need for Acute Rehabilitation Level of Care: The patient requires daily medical management by a physician with specialized training in physical medicine and rehabilitation for the following conditions: Daily direction of a multidisciplinary physical rehabilitation program to ensure safe treatment while eliciting the  highest outcome that is of practical value to the patient.: Yes Daily medical management of patient stability for increased activity during participation in an intensive rehabilitation regime.: Yes Daily analysis of laboratory values and/or radiology reports with any subsequent need for medication adjustment of medical intervention for : Neurological problems;Other  Tandi Hanko 12/12/2017, 9:55 AM

## 2017-12-12 ENCOUNTER — Inpatient Hospital Stay (HOSPITAL_COMMUNITY): Payer: PRIVATE HEALTH INSURANCE | Admitting: Physical Therapy

## 2017-12-12 ENCOUNTER — Inpatient Hospital Stay (HOSPITAL_COMMUNITY): Payer: Self-pay

## 2017-12-12 ENCOUNTER — Inpatient Hospital Stay (HOSPITAL_COMMUNITY): Payer: PRIVATE HEALTH INSURANCE | Admitting: Occupational Therapy

## 2017-12-12 ENCOUNTER — Inpatient Hospital Stay (HOSPITAL_COMMUNITY): Payer: PRIVATE HEALTH INSURANCE | Admitting: Speech Pathology

## 2017-12-12 LAB — CULTURE, BLOOD (ROUTINE X 2)
CULTURE: NO GROWTH
Culture: NO GROWTH
SPECIAL REQUESTS: ADEQUATE

## 2017-12-12 NOTE — Progress Notes (Signed)
Speech Language Pathology Daily Session Note  Patient Details  Name: William Lowery MRN: [id number] Date of Birth: [birth year]  Today's Date: 12/12/2017 SLP Individual Time: 0930-1000 SLP Individual Time Calculation (min): 30 min  Short Term Goals: Week 1: SLP Short Term Goal 1 (Week 1): STGs = LTGs due to shorth length of stay  Skilled Therapeutic Interventions: Skilled treatment session focused on communication goals. SLP facilitated session by educating patient in regards to naming strategies during functional naming tasks. Initially, pt required Max A verbal cues for generative naming, which faded to Mod I at the end of session with use of compensatory strategies. Patient left upright in wheelchair with all needs within reach. Continue with current plan of care.     Function:  Cognition Comprehension Comprehension assist level: Understands basic 90% of the time/cues < 10% of the time  Expression   Expression assist level: Expresses basic 75 - 89% of the time/requires cueing 10 - 24% of the time. Needs helper to occlude trach/needs to repeat words.  Social Interaction Social Interaction assist level: Interacts appropriately 90% of the time - Needs monitoring or encouragement for participation or interaction.  Problem Solving Problem solving assist level: Solves basic 75 - 89% of the time/requires cueing 10 - 24% of the time  Memory Memory assist level: More than reasonable amount of time    Pain No/denies pain  Therapy/Group: Individual Therapy  Meredeth Ide  SLP - Student 12/12/2017, 3:49 PM

## 2017-12-12 NOTE — Progress Notes (Signed)
Occupational Therapy Session Note  Patient Details  Name: William Lowery MRN: [id number] Date of Birth: 10-29-1966  Today's Date: 12/12/2017 OT Individual Time: 1030-1200 OT Individual Time Calculation (min): 90 min    Short Term Goals: Week 1:  OT Short Term Goal 1 (Week 1): STGs=LTGs secondary to estimated short LOS  Skilled Therapeutic Interventions/Progress Updates:    Upon entering the room, pt seated in wheelchair awaiting therapist with no c/o pain this session. Pt declined bathing but donning UB shirt with set up A. Pt ambulating with RW and min A 10' to bathroom for toileting. Steady assistance for balance with clothing management, hygiene, and transfer. Pt ambulating 100' with RW into day room with overall steady assistance for balance. Pt taking seated rest break. OT focusing on R UE NMR for coordination and strengthening tasks. OT provided paper handout for theraputty exercises. OT provided min demonstration and verbal cues for pt to utilize yellow, soft theraputty for coordination and strengthening with R hand only. Pt needing multiple rest breaks secondary to fatigue. Pt utilizing three jaw chuck grip for small manipulatives and placing into container with 2 drops. Pt given various animals and having to pick up with R hand, placing into container, and naming animal. Pt able to name 18/20 animals this session. Pt ambulating back to room in same manner as above. Pt seated in wheelchair with quick release belt donned and lunch tray with call bell within reach.   Therapy Documentation Precautions:  Precautions Precautions: Fall Restrictions Weight Bearing Restrictions: No General:   Vital Signs:  Pain: Pain Assessment Pain Assessment: No/denies pain Pain Score: 0-No pain  See Function Navigator for Current Functional Status.   Therapy/Group: Individual Therapy  Gypsy Decant 12/12/2017, 12:57 PM

## 2017-12-12 NOTE — Progress Notes (Signed)
Physical Therapy Session Note  Patient Details  Name: William Lowery MRN: [id number] Date of Birth: 06-Apr-1966  Today's Date: 12/12/2017 PT Individual Time: 4166-0630 PT Individual Time Calculation (min): 25 min   Short Term Goals: Week 1:  PT Short Term Goal 1 (Week 1): STG = LTG due to short ELOS.  Skilled Therapeutic Interventions/Progress Updates:  Pt received in w/c with wife present. No c/o pain reported. Pt refused to don socks as he reports he attempted to earlier and was unable therefore pt's wife donned socks total assist. Pt's wife reporting blood at surgical site (R groin region) and RN aware. RN Deidre Ala) arrived to assess the site and pt transferred sit>stand with cuing for hand placement to push up on armrest and steady assist. Pt able to maintain static standing with BUE support and close supervision/steady assist. RN notified PA Linna Hoff) of pt's c/o and per RN the PA recommends pt return to bed until he can assess site. Pt completed stand pivot w/c>bed with min assist with max cuing for hand placement and overall technique. Pt transferred sit>supine with supervision, HOB elevated & bed rails. During session therapist educated pt & wife on stroke recovery. At end of session pt left in bed with alarm set, all needs within reach & wife present to supervise. Pt missed 35 minutes of skilled PT treatment 2/2 awaiting PA assessment.  Therapy Documentation Precautions:  Precautions Precautions: Fall Restrictions Weight Bearing Restrictions: No   General: PT Amount of Missed Time (min): 35 Minutes PT Missed Treatment Reason: (medical)   See Function Navigator for Current Functional Status.   Therapy/Group: Individual Therapy  [name] 12/12/2017, 1:51 PM

## 2017-12-12 NOTE — Progress Notes (Signed)
[place] PHYSICAL MEDICINE & REHABILITATION     PROGRESS NOTE    Subjective/Complaints: No new problems overnight  ROS: pt denies nausea, vomiting, diarrhea, cough, shortness of breath or chest pain   Objective: Vital Signs: Blood pressure (!) 134/94, pulse 93, temperature 98.8 F (37.1 C), temperature source Oral, resp. rate 18, weight 131.8 kg (290 lb 9.1 oz), SpO2 98 %. No results found. Recent Labs    12/11/17 0610  WBC 10.1  HGB 14.6  HCT 42.5  PLT 269   Recent Labs    12/10/17 0421 12/11/17 0610  NA 139 139  K 3.6 4.3  CL 107 103  GLUCOSE 84 96  BUN 11 12  CREATININE 0.76 0.80  CALCIUM 8.5* 8.7*   CBG (last 3)  Recent Labs    12/10/17 2154 12/11/17 0639 12/11/17 1224  GLUCAP 106* 80 104*    Wt Readings from Last 3 Encounters:  12/12/17 131.8 kg (290 lb 9.1 oz)  12/10/17 (!) 138.3 kg (304 lb 14.3 oz)  05/21/17 (!) 140.2 kg (309 lb)    Physical Exam:  Constitutional: He appears well-developed.  52 year old right-handed male NAD  HENT:  Head: Normocephalic and atraumatic.  Eyes: EOMI Neck: Normal range of motion. Neck supple. No thyromegaly present.  Cardiovascular: RRR without murmur. No JVD   Respiratory: CTA Bilaterally without wheezes or rales. Normal effort  GI: Soft. Bowel sounds are normal. He exhibits no distension.  Musculoskeletal:  No edema or tenderness in extremities  Neurological: He is alert.      Able to communicate thoughts and ideas fairly appropriately Followed simple commands.  He was able to provide his name. Visual fields appear to be intact in all 4 quadrants although daily and depth perception seem to be impaired. Motor: RUE: 4/5 proximal to distal with ataxia RLE: 4/5 proximal to distal with ataxia LUE: 5/5 proximal to distal LLE: 4+ to 5/5 proximal to distal  Skin: Skin is warm and dry.  Vascular changes b/l LE  Psychiatric: His affect is blunt. His speech is delayed. He is slowed.  Confused      Assessment/Plan: 1.  Functional and mobility deficits secondary to left PCA infarction which require 3+ hours per day of interdisciplinary therapy in a comprehensive inpatient rehab setting. Physiatrist is providing close team supervision and 24 hour management of active medical problems listed below. Physiatrist and rehab team continue to assess barriers to discharge/monitor patient progress toward functional and medical goals.  Function:  Bathing Bathing position   Position: Shower  Bathing parts Body parts bathed by patient: Right arm, Left arm, Chest, Abdomen, Front perineal area, Right upper leg, Left upper leg Body parts bathed by helper: Buttocks, Right lower leg, Left lower leg, Back  Bathing assist Assist Level: (mod A)      Upper Body Dressing/Undressing Upper body dressing   What is the patient wearing?: Pull over shirt/dress     Pull over shirt/dress - Perfomed by patient: Thread/unthread right sleeve, Thread/unthread left sleeve, Pull shirt over trunk Pull over shirt/dress - Perfomed by helper: Put head through opening        Upper body assist Assist Level: Touching or steadying assistance(Pt > 75%)      Lower Body Dressing/Undressing Lower body dressing   What is the patient wearing?: Pants, Shoes     Pants- Performed by patient: Thread/unthread right pants leg, Thread/unthread left pants leg Pants- Performed by helper: Pull pants up/down         Shoes - Performed  by patient: Don/doff right shoe, Don/doff left shoe            Lower body assist Assist for lower body dressing: Touching or steadying assistance (Pt > 75%)      Toileting Toileting   Toileting steps completed by patient: Adjust clothing prior to toileting, Performs perineal hygiene, Adjust clothing after toileting Toileting steps completed by helper: Adjust clothing prior to toileting, Adjust clothing after toileting Toileting Assistive Devices: Grab bar or rail, Other (comment)(RW)   Toileting assist Assist level: Touching or steadying assistance (Pt.75%)   Transfers Chair/bed transfer Chair/bed transfer activity did not occur: Safety/medical concerns           Locomotion Ambulation     Max distance: 10 ft (10 ft min assist without AD, 90 ft steady/min assist with RW) Assist level: Touching or steadying assistance (Pt > 75%)   Wheelchair Wheelchair activity did not occur: Safety/medical concerns        Cognition Comprehension Comprehension assist level: Understands complex 90% of the time/cues 10% of the time  Expression Expression assist level: Expresses basic 75 - 89% of the time/requires cueing 10 - 24% of the time. Needs helper to occlude trach/needs to repeat words.  Social Interaction Social Interaction assist level: Interacts appropriately with others with medication or extra time (anti-anxiety, antidepressant).  Problem Solving Problem solving assist level: Solves basic 50 - 74% of the time/requires cueing 25 - 49% of the time  Memory Memory assist level: More than reasonable amount of time   Medical Problem List and Plan: 1.  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  DVT Prophylaxis/Anticoagulation: Brilinta. Venous Doppler studies negative 3. Pain Management: Tylenol as needed 4. Mood: Provide emotional support 5. Neuropsych: This patient is not yet fully capable of making decisions on his own behalf. 6. Skin/Wound Care: Routine skin checks 7. Fluids/Electrolytes/Nutrition: Labs collected this morning and essentially within normal limits except for low albumin.  Encourage p.o. intake.  Patient does not appear malnourished 64.History of hypertension. Cozaar 50 mg daily   -Keep watch of diastolic blood pressure 9.Morbid obesity. BMI 44.7.Dietary follow-up 10.OSA. Patient on CPAP at home 11. Clostridium difficile. Contact  precautions. Vancomycin 125 mg 4 times daily 12/06/2017 for 14 days    -Stool consistency is improving  LOS (Days) 2 A FACE TO FACE EVALUATION WAS PERFORMED  Meredith Staggers, MD 12/12/2017 8:52 AM

## 2017-12-13 ENCOUNTER — Inpatient Hospital Stay (HOSPITAL_COMMUNITY): Payer: Self-pay | Admitting: Physical Therapy

## 2017-12-13 ENCOUNTER — Inpatient Hospital Stay (HOSPITAL_COMMUNITY): Payer: PRIVATE HEALTH INSURANCE | Admitting: Occupational Therapy

## 2017-12-13 ENCOUNTER — Inpatient Hospital Stay (HOSPITAL_COMMUNITY): Payer: PRIVATE HEALTH INSURANCE | Admitting: Speech Pathology

## 2017-12-13 ENCOUNTER — Inpatient Hospital Stay (HOSPITAL_COMMUNITY): Payer: PRIVATE HEALTH INSURANCE | Admitting: Physical Therapy

## 2017-12-13 LAB — ALPHA GALACTOSIDASE: ALPHA GALACTOSIDASE, SERUM: 34.5 nmol/h/mg{prot} (ref 28.0–80.0)

## 2017-12-13 MED ORDER — NYSTATIN 100000 UNIT/GM EX POWD
Freq: Two times a day (BID) | CUTANEOUS | Status: DC
  Administered 2017-12-13 – 2017-12-19 (×12): via TOPICAL
  Filled 2017-12-13: qty 15

## 2017-12-13 NOTE — Progress Notes (Signed)
Speech Language Pathology Daily Session Note  Patient Details  Name: William Lowery MRN: [id number] Date of Birth: Sep 12, 1966  Today's Date: 12/13/2017 SLP Individual Time: 0915-1000 SLP Individual Time Calculation (min): 45 min  Short Term Goals: Week 1: SLP Short Term Goal 1 (Week 1): STGs = LTGs due to shorth length of stay  Skilled Therapeutic Interventions: Skilled treatment session focused on cognitive and communication goals. SLP facilitated session by providing Supervision verbal cues for problem solving during a novel, moderately complex card task (Call-It). Pt demonstrated generative naming with Mod I and recall of new information with Mod A verbal cues. Pt verbalized difficulty in recall of new and functional information, so new goal of recall is added. Pt left upright in wheelchair with NT for transfer to room. Continue with current plan of care.   Function:  Cognition Comprehension Comprehension assist level: Understands complex 90% of the time/cues 10% of the time  Expression   Expression assist level: Expresses basic 90% of the time/requires cueing < 10% of the time.  Social Interaction Social Interaction assist level: Interacts appropriately 90% of the time - Needs monitoring or encouragement for participation or interaction.  Problem Solving Problem solving assist level: Solves complex 90% of the time/cues < 10% of the time  Memory Memory assist level: Recognizes or recalls 50 - 74% of the time/requires cueing 25 - 49% of the time    Pain Pain Assessment Pain Assessment: No/denies pain  Therapy/Group: Individual Therapy  Meredeth Ide  SLP - Student 12/13/2017, 12:56 PM

## 2017-12-13 NOTE — IPOC Note (Addendum)
Overall Plan of Care 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 ([place])     Functional Problem List: Nursing Bladder, Bowel, Edema, Endurance, Medication Management, Nutrition, Perception, Safety, Sensory, Skin Integrity, Motor  PT Balance, Safety, Behavior, Sensory, Endurance, Motor, Perception  OT Balance, Cognition, Endurance, Safety, Vision  SLP Cognition, Linguistic  TR         Basic ADL's: OT Grooming, Bathing, Dressing, Toileting     Advanced  ADL's: OT       Transfers: PT Bed Mobility, Bed to Chair, Car, Manufacturing systems engineer, Metallurgist: PT Ambulation, Emergency planning/management officer, Stairs     Additional Impairments: OT Fuctional Use of Upper Extremity  SLP Communication, Social Cognition expression Problem Solving, Awareness  TR      Anticipated Outcomes Item Anticipated Outcome  Self Feeding supervision  Swallowing      Basic self-care  mod I - supervision  Toileting  supervision   Bathroom Transfers supervision  Bowel/Bladder  min assist   Transfers  supervision with LRAD  Locomotion  supervision with LRAD  Communication  Supervision  Cognition  Supervision  Pain  less<2  Safety/Judgment  min assist   Therapy Plan: PT Intensity: Minimum of 1-2 x/day ,45 to 90 minutes PT Frequency: 5 out of 7 days PT Duration Estimated Length of Stay: 7-10 days OT Intensity: Minimum of 1-2 x/day, 45 to 90 minutes OT Frequency: 5 out of 7 days OT Duration/Estimated Length of Stay: 7-10 days SLP Intensity: Minumum of 1-2 x/day, 30 to 90 minutes SLP Frequency: 3 to 5 out of 7 days SLP Duration/Estimated Length of Stay: 12/19/2017    Team Interventions: Nursing Interventions Patient/Family Education, Bowel Management, Skin Care/Wound  Management, Psychosocial Support, Disease Management/Prevention, Bladder Management, Medication Management, Discharge Planning  PT interventions Ambulation/gait training, Community reintegration, DME/adaptive equipment instruction, Neuromuscular re-education, Psychosocial support, Stair training, UE/LE Strength taining/ROM, Wheelchair propulsion/positioning, UE/LE Coordination activities, Therapeutic Activities, Pain management, Functional electrical stimulation, Discharge planning, Training and development officer, Cognitive remediation/compensation, Disease management/prevention, Functional mobility training, Patient/family education, Splinting/orthotics, Therapeutic Exercise, Visual/perceptual remediation/compensation  OT Interventions Balance/vestibular training, Discharge planning, Pain management, Functional electrical stimulation, Self Care/advanced ADL retraining, Therapeutic Activities, UE/LE Coordination activities, Cognitive remediation/compensation, Functional mobility training, Patient/family education, Therapeutic Exercise, Visual/perceptual remediation/compensation, Community reintegration, Engineer, drilling, Neuromuscular re-education, Psychosocial support, UE/LE Strength taining/ROM  SLP Interventions Cognitive remediation/compensation, Speech/Language facilitation, Functional tasks, English as a second language teacher, Therapeutic Activities, Patient/family education  TR Interventions    SW/CM Interventions Discharge Planning, Psychosocial Support, Patient/Family Education   Barriers to Discharge MD  Medical stability  Nursing      PT      OT Other (comments) none known at this time  SLP      SW       Team Discharge Planning: Destination: PT-Home ,OT- Home , SLP-Home Projected Follow-up: PT-Outpatient PT, 24 hour supervision/assistance, OT-  24 hour supervision/assistance, Home health OT, SLP-Outpatient SLP, 24 hour supervision/assistance Projected Equipment Needs: PT-Rolling walker  with 5" wheels, OT- To be determined, SLP-None recommended by SLP Equipment Details: PT- , OT-  Patient/family involved in discharge planning: PT- Family member/caregiver, Patient,  OT-Patient, Family member/caregiver, SLP-Patient  MD ELOS: 7-10d Medical Rehab Prognosis:  Good Assessment:  52 y.o.right handedmalewith history of hypertension, sleep apnea, obesity. Per chart review patient lives with spouse. One level home two steps to entry. Independent  prior to admission.He works full time and self-employed with [employer]. Presented 12/01/2017 with right-sided numbness as well as intermittent headache and right eye visual blurriness. Cranial CT scan reviewed, unremarkable for acute intracranial process. Per report, small remote right inferior cerebellar infarction. CT angiogram head and neck showed left PCA occlusion beginning at its origin. Cerebral angiogram interventional radiology and underwent thrombectomy.Patient remained intubated through 12/03/2017. Follow-up MRI showed multifocal acute ischemia within the posterior circulation territories predominantly within the left PCA distribution, including left occipital lobe and left thalamus.Echocardiogram with ejection fraction of 55-60%. Lower extremity Dopplers negative. TEE/loop recorder placement 12/06/2017 showed ejection fraction of 70%.. No thrombus and negative for PFO. 0.51.0 cm calcified mass adjacent to the [place] that restricts movement question vegetation with CT angiogram of the chest completed for confirmation showing a 4.3 cm ascending thoracic aortic aneurysm and recommendations for annual imaging follow-up by CTA or MRA. Neurology consulted presently maintained on aspirin as well as Brilinta. Tolerating a regular consistency diet. Bouts of hypokalemia with supplement added. C. difficile specimen 12/06/2017 positive placed on vancomycin orally 125 mg 4 times a day 14 days as recommended by infectious disease. Latest blood  culture 12/07/2017 no growth   Now requiring 24/7 Rehab RN,MD, as well as CIR level PT, OT and SLP.  Treatment team will focus on ADLs and mobility with goals set at supervision   See Team Conference Notes for weekly updates to the plan of care

## 2017-12-13 NOTE — Progress Notes (Signed)
Physical Therapy Session Note  Patient Details  Name: William Lowery MRN: [id number] Date of Birth: [birth year]  Today's Date: 12/13/2017 PT Individual Time: 1130-1200 PT Individual Time Calculation (min): 30 min   Short Term Goals: Week 1:  PT Short Term Goal 1 (Week 1): STG = LTG due to short ELOS.  Skilled Therapeutic Interventions/Progress Updates: Pt received supine in bed, denies pain and agreeable to treatment. Supine>sit with S. Gait to/from gym with RW and min guard. Performed dynamic step taps alternating RLE/LLE with light UE support on RW. Performed four square step test for focus on dynamic weight shifts, multi-direction stepping with min guard overall and mod cues for sequencing; one LOB to R side when stepping to S requiring modA to recover. Side steps and backward stepping 2x10' each direction with min guard. Returned to room as above; remained seated in w/c at end of session, RN and wife present, and all needs in reach.      Therapy Documentation Precautions:  Precautions Precautions: Fall Restrictions Weight Bearing Restrictions: No  Pain: Pain Assessment Pain Assessment: No/denies pain   See Function Navigator for Current Functional Status.   Therapy/Group: Individual Therapy  Luberta Mutter 12/13/2017, 12:58 PM

## 2017-12-13 NOTE — Progress Notes (Signed)
Physical Therapy Session Note  Patient Details  Name: William Lowery MRN: [id number] Date of Birth: [birth year]  Today's Date: 12/13/2017 PT Individual Time: 1400-1500 PT Individual Time Calculation (min): 60 min   Short Term Goals: Week 1:  PT Short Term Goal 1 (Week 1): STG = LTG due to short ELOS.  Skilled Therapeutic Interventions/Progress Updates:  Pt received in w/c & agreeable to tx, no c/o pain reported & wife present for session. Gait room<>gym with RW & steady assist with cuing to look to R 2/2 visual deficits & potential decreased attention. Pt performed standing step taps on 3" then 6" step with min assist for balance and cuing for technique with activity focusing on weight shifting, balance, and coordination. Pt reporting slight lightheadedness during standing activities that ceased with rest - pt feels this is due to increased activity on unit. Pt performed side steps on 6" step with R UE support with task focusing on RLE strengthening & NMR; pt required steady assist for balance. Pt completed 10x sit<>stand without UE support for BLE strengthening. Pt returned to room & left in w/c with wife present to supervise. Wife & pt asking appropriate questions regarding CLOF & therapeutic techniques throughout session. Pt fatigues quickly & requires frequent seated rest breaks.  Therapy Documentation Precautions:  Precautions Precautions: Fall Restrictions Weight Bearing Restrictions: No   See Function Navigator for Current Functional Status.   Therapy/Group: Individual Therapy  [name] 12/13/2017, 3:11 PM

## 2017-12-13 NOTE — Progress Notes (Signed)
Social Work Patient ID: William Lowery, male   DOB: 1966-10-04, 52 y.o.   MRN: [id number]   Have reviewed team conference with pt and wife.  Both aware and agreeable with targeted d/c date of 2/13 and supervision goals overall.  Wife very supportive and confirms she can provide 24/7 support.  Continue to follow.  Shaneque Merkle, LCSW

## 2017-12-13 NOTE — Progress Notes (Signed)
[place] PHYSICAL MEDICINE & REHABILITATION     PROGRESS NOTE    Subjective/Complaints: No complaints overnight.  Does have some irritation in his scrotal region.  Denies headaches  ROS: pt denies nausea, vomiting, diarrhea, cough, shortness of breath or chest pain    Objective: Vital Signs: Blood pressure (!) 137/98, pulse 72, temperature 98.4 F (36.9 C), temperature source Oral, resp. rate 16, weight 129.9 kg (286 lb 6 oz), SpO2 97 %. No results found. Recent Labs    12/11/17 0610  WBC 10.1  HGB 14.6  HCT 42.5  PLT 269   Recent Labs    12/11/17 0610  NA 139  K 4.3  CL 103  GLUCOSE 96  BUN 12  CREATININE 0.80  CALCIUM 8.7*   CBG (last 3)  Recent Labs    12/10/17 2154 12/11/17 0639 12/11/17 1224  GLUCAP 106* 80 104*    Wt Readings from Last 3 Encounters:  12/13/17 129.9 kg (286 lb 6 oz)  12/10/17 (!) 138.3 kg (304 lb 14.3 oz)  05/21/17 (!) 140.2 kg (309 lb)    Physical Exam:  Constitutional: He appears well-developed.  52 year old right-handed male NAD  HENT:  Head: Normocephalic and atraumatic.  Eyes: EOMI Neck: Normal range of motion. Neck supple. No thyromegaly present.  Cardiovascular:RRR without murmur. No JVD    Respiratory: Normal effort GI: Soft. Bowel sounds are normal. He exhibits no distension.  Musculoskeletal:  No edema or tenderness in extremities  Neurological: He is alert.      Delays in expressive language and thought processing.  Visual fields appear to be intact in all 4 quadrants although daily and depth perception seem to be impaired. Motor: RUE: 4/5 proximal to distal with ataxia RLE: 4/5 proximal to distal with ataxia LUE: 5/5 proximal to distal LLE: 4+ to 5/5 proximal to distal  Skin: Skin is warm and dry.  Groin and scrotal region with red, moistened tissue Vascular changes b/l LE  Psychiatric: Generally pleasant and appropriate   Assessment/Plan: 1.  Functional and mobility deficits secondary to left PCA  infarction which require 3+ hours per day of interdisciplinary therapy in a comprehensive inpatient rehab setting. Physiatrist is providing close team supervision and 24 hour management of active medical problems listed below. Physiatrist and rehab team continue to assess barriers to discharge/monitor patient progress toward functional and medical goals.  Function:  Bathing Bathing position   Position: Shower  Bathing parts Body parts bathed by patient: Right arm, Left arm, Chest, Abdomen, Front perineal area, Right upper leg, Left upper leg Body parts bathed by helper: Buttocks, Right lower leg, Left lower leg, Back  Bathing assist Assist Level: (mod A)      Upper Body Dressing/Undressing Upper body dressing   What is the patient wearing?: Pull over shirt/dress     Pull over shirt/dress - Perfomed by patient: Thread/unthread right sleeve, Thread/unthread left sleeve, Pull shirt over trunk Pull over shirt/dress - Perfomed by helper: Put head through opening        Upper body assist Assist Level: Touching or steadying assistance(Pt > 75%)      Lower Body Dressing/Undressing Lower body dressing   What is the patient wearing?: Pants, Shoes     Pants- Performed by patient: Thread/unthread right pants leg, Thread/unthread left pants leg Pants- Performed by helper: Pull pants up/down         Shoes - Performed by patient: Don/doff right shoe, Don/doff left shoe  Lower body assist Assist for lower body dressing: Touching or steadying assistance (Pt > 75%)      Toileting Toileting   Toileting steps completed by patient: Adjust clothing prior to toileting, Performs perineal hygiene, Adjust clothing after toileting Toileting steps completed by helper: Adjust clothing prior to toileting, Performs perineal hygiene, Adjust clothing after toileting Toileting Assistive Devices: Grab bar or rail  Toileting assist Assist level: Touching or steadying assistance (Pt.75%)    Transfers Chair/bed transfer Chair/bed transfer activity did not occur: Safety/medical concerns Chair/bed transfer method: Stand pivot Chair/bed transfer assist level: Touching or steadying assistance (Pt > 75%)       Locomotion Ambulation     Max distance: 100' Assist level: Touching or steadying assistance (Pt > 75%)   Wheelchair Wheelchair activity did not occur: Safety/medical concerns        Cognition Comprehension Comprehension assist level: Understands basic 90% of the time/cues < 10% of the time  Expression Expression assist level: Expresses basic 75 - 89% of the time/requires cueing 10 - 24% of the time. Needs helper to occlude trach/needs to repeat words.  Social Interaction Social Interaction assist level: Interacts appropriately 90% of the time - Needs monitoring or encouragement for participation or interaction.  Problem Solving Problem solving assist level: Solves basic 75 - 89% of the time/requires cueing 10 - 24% of the time  Memory Memory assist level: More than reasonable amount of time   Medical Problem List and Plan: 1.  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  DVT Prophylaxis/Anticoagulation: Brilinta. Venous Doppler studies negative 3. Pain Management: Tylenol as needed 4. Mood: Provide emotional support 5. Neuropsych: This patient is not yet fully capable of making decisions on his own behalf. 6. Skin/Wound Care: Routine skin checks   -Nystatin powder to groin 7. Fluids/Electrolytes/Nutrition: Labs collected this morning and essentially within normal limits except for low albumin.  Encourage p.o. intake.  Patient does not appear malnourished 18.History of hypertension. Cozaar 50 mg daily   -Diastolic blood pressure still elevated 9.Morbid obesity. BMI 44.7.Dietary follow-up 10.OSA. Patient on CPAP at home 11. Clostridium  difficile. Contact precautions. Vancomycin 125 mg 4 times daily 12/06/2017 for 14 days    -Stool consistency is improving  LOS (Days) 3 A FACE TO FACE EVALUATION WAS PERFORMED  Alger Simons T, MD 12/13/2017 8:30 AM

## 2017-12-13 NOTE — Plan of Care (Signed)
  Progressing Consults RH STROKE PATIENT EDUCATION Description See Patient Education module for education specifics  12/13/2017 2339 - Progressing by Evelena Asa, RN RH BOWEL ELIMINATION RH STG MANAGE BOWEL WITH ASSISTANCE Description STG Manage Bowel with mod I Assistance.  12/13/2017 2339 - Progressing by Evelena Asa, RN RH STG MANAGE BOWEL W/MEDICATION W/ASSISTANCE Description STG Manage Bowel with Medication with min.Assistance.  12/13/2017 2339 - Progressing by Evelena Asa, RN RH BLADDER ELIMINATION RH STG MANAGE BLADDER WITH ASSISTANCE Description STG Manage Bladder With mod I Assistance  12/13/2017 2339 - Progressing by Evelena Asa, RN RH SAFETY RH STG ADHERE TO SAFETY PRECAUTIONS W/ASSISTANCE/DEVICE Description STG Adhere to Safety Precautions With mod.Assistance/Device.  12/13/2017 2339 - Progressing by Evelena Asa, RN RH COGNITION-NURSING RH STG USES MEMORY AIDS/STRATEGIES W/ASSIST TO PROBLEM SOLVE Description STG Uses Memory Aids/Strategies With mod.Assistance to Problem Solve.  12/13/2017 2339 - Progressing by Evelena Asa, RN RH PAIN MANAGEMENT RH STG PAIN MANAGED AT OR BELOW PT'S PAIN GOAL Description Less than 3  12/13/2017 2339 - Progressing by Evelena Asa, RN

## 2017-12-13 NOTE — Progress Notes (Signed)
Occupational Therapy Session Note  Patient Details  Name: William Lowery MRN: [id number] Date of Birth: August 24, 1966  Today's Date: 12/13/2017 OT Individual Time: 4481-8563 and 1330-1400 OT Individual Time Calculation (min): 59 min and 30 min    Short Term Goals: Week 1:  OT Short Term Goal 1 (Week 1): STGs=LTGs secondary to estimated short LOS  Skilled Therapeutic Interventions/Progress Updates:   Session 1: Upon entering the room, pt seated in wheelchair awaiting therapist with no c/o pain. Pt ambulating with RW to dresser with steady assistance to obtain clothing items from dresser with use of R UE for functional task. Pt ambulated with RW into bathroom and transferred onto toilet with steady assistance to void. Pt removing clothing while seated on commode for safety. Stand pivot transfer onto TTB for bathing at shower level. Pt required mod verbal cues for sequencing and initiation. Pt initially just sitting in shower and asking therapist, " What do it do?". Pt exiting the bathroom and donning clothing items from seated position on bed with overall steady assistance for balance with LB clothing management. Min verbal cues to attend to R UE and pt able to perform UB self care with supervision and successfully tying pants with increased time. Pt remained seated in wheelchair at end of session with call bell and all needed items within reach upon exiting the room.   Session : Upon entering the room, pt supine in bed with wife present in the room. Pt performed stand pivot transfer from bed> wheelchair without use of AD with steady assistance.Skilled OT intervention with focus on R NMR. Pt engaged in various card tasks with min cues for proper technique and able to perform with R hand with increased time for problem solving. OT recommended wife bring word search, and writing activities, as well as maze tasks to continue to address fine motor and ataxic movements. She verbalized understanding. Pt able to  draw line through minimally difficult maze without error but very uncoordinated movements. Pt remained in wheelchair with quick release belt donned at end of session.   Therapy Documentation Precautions:  Precautions Precautions: Fall Restrictions Weight Bearing Restrictions: No General:   Vital Signs:  Pain: Pain Assessment Pain Assessment: No/denies pain  See Function Navigator for Current Functional Status.   Therapy/Group: Individual Therapy  Gypsy Decant 12/13/2017, 10:03 AM

## 2017-12-13 NOTE — Progress Notes (Signed)
Social Work Social Work Assessment and Plan  Patient Details  Name: William Lowery MRN: [id number] Date of Birth: 02-17-1966  Today's Date: 12/13/2017  Problem List:  Patient Active Problem List   Diagnosis Date Noted  . 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 Cerebrovascular accident (CVA) due to thrombosis of left posterior cerebral artery ([place])   . Benign essential HTN   . Morbid obesity ([place])   . OSA (obstructive sleep apnea)   . Enteritis due to Clostridium difficile   . Acute CVA (cerebrovascular accident) ([place]) 12/01/2017  . Acute arterial ischemic stroke, multifocal, posterior circulation ([place]) 12/01/2017   Past Medical History:  Past Medical History:  Diagnosis Date  . Asthma    hx of  . Heart murmur    as an infant  . Hypertension   . Sleep apnea    wears CPAP   Past Surgical History:  Past Surgical History:  Procedure Laterality Date  . bellybutton procedure     as a newborn  . IR ANGIO INTRA EXTRACRAN SEL COM CAROTID INNOMINATE BILAT MOD SED  12/01/2017  . IR ANGIO VERTEBRAL SEL VERTEBRAL UNI R MOD SED  12/01/2017  . IR CT HEAD LTD  12/01/2017  . IR INTRA CRAN STENT  12/01/2017  . IR PERCUTANEOUS ART THROMBECTOMY/INFUSION INTRACRANIAL INC DIAG ANGIO  12/01/2017  . RADIOLOGY WITH ANESTHESIA N/A 12/01/2017   Procedure: RADIOLOGY WITH ANESTHESIA CODE STROKE;  Surgeon: Radiologist, Medication, MD;  Location: [place];  Service: Radiology;  Laterality: N/A;  . TEE WITHOUT CARDIOVERSION N/A 12/06/2017   Procedure: TRANSESOPHAGEAL ECHOCARDIOGRAM (TEE);  Surgeon: Pixie Casino, MD;  Location: [facility] END[facility]COPY;  Service: Cardiovascular;  Laterality: N/A;   Social History:  reports that  has never smoked. His smokeless tobacco use includes snuff. He reports that he does not drink alcohol or use drugs.  Family / Support Systems Marital Status: Married How Long?: 7 yrs (2nd marriage for each) Patient Roles:  Spouse, Parent Spouse/Significant Other: Shelda Jakes @ (C) [phone] Children: 3 adult children between pt and wife with youngest in college Anticipated Caregiver: wife Ability/Limitations of Caregiver: wife not working and no physical limitations. Caregiver Availability: 24/7 Family Dynamics: Wife notes that she feels pt's mother and sister "add stress" to pt but does note they are supportive and involved.  Wife simply wants to limits stressors/ work concerns while pt is recovering.  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 Legal Hisotry/Current Legal Issues: None Guardian/Conservator: None - per MD, pt is not yet fully capable of making decisions on his own behalf - defer to spouse.   Abuse/Neglect Abuse/Neglect Assessment Can Be Completed: Yes Physical Abuse: Denies Verbal Abuse: Denies Sexual Abuse: Denies Exploitation of patient/patient's resources: Denies Self-Neglect: Denies  Emotional Status Pt's affect, behavior adn adjustment status: Pt very pleasant, humorous and completes assessment interview without any difficulty.  He does admit he feels frustration with his current limitations, however, does not feel his mood is depressed or anxious.  Will monitor and refer for neuropsychology as indicated. Recent Psychosocial Issues: None Pyschiatric History: None Substance Abuse History: None  Patient / Family Perceptions, Expectations & Goals Pt/Family understanding of illness & functional limitations: Pt and wife have a very good  understanding of his medical course and note he will have further OP work up to better determine possible cause of stroke.  Good understanding of functional limitations/ need for  CIR. Premorbid pt/family roles/activities: Pt was completely independent and working f/t with business. Anticipated changes in roles/activities/participation: Wife to provide any caregiver support needed.   Pt/family expectations/goals: "I'm just going to focus on me right now."  [employer]: None Premorbid Home Care/DME Agencies: None Transportation available at discharge: yes Resource referrals recommended: Neuropsychology  Discharge Planning Living Arrangements: Spouse/significant other Support Systems: Spouse/significant other, Children, Parent, Other relatives, Friends/neighbors Type of Residence: Private residence Insurance Resources: Teacher, adult education Resources: Employment Museum/gallery curator Screen Referred: No Living Expenses: Higher education careers adviser Management: Patient Does the patient have any problems obtaining your medications?: No Home Management: mostly wife Patient/Family Preliminary Plans: Pt to d/c home with wife as primary caregiver Social Work Anticipated Follow Up Needs: HH/OP Expected length of stay: 7-10 days  Clinical Impression Very pleasant gentleman here following a CVA but making good progress and team anticipating short LOS and supervision goals overall.  Wife very supportive and able to provide 24/7 assistance.  Pt denies any significant emotional distress but will monitor.  To follow for support and d/c planning needs.  Sarinah Doetsch 12/13/2017, 1:06 PM

## 2017-12-14 ENCOUNTER — Inpatient Hospital Stay (HOSPITAL_COMMUNITY): Payer: Self-pay | Admitting: Occupational Therapy

## 2017-12-14 ENCOUNTER — Inpatient Hospital Stay (HOSPITAL_COMMUNITY): Payer: Self-pay | Admitting: Physical Therapy

## 2017-12-14 ENCOUNTER — Inpatient Hospital Stay (HOSPITAL_COMMUNITY): Payer: Self-pay | Admitting: Speech Pathology

## 2017-12-14 NOTE — Progress Notes (Signed)
[place] PHYSICAL MEDICINE & REHABILITATION     PROGRESS NOTE    Subjective/Complaints: Up with therapy this am. No new issues  ROS: pt denies nausea, vomiting, diarrhea, cough, shortness of breath or chest pain    Objective: Vital Signs: Blood pressure 109/78, pulse 85, temperature 98 F (36.7 C), temperature source Oral, resp. rate 18, weight 130.7 kg (288 lb 2.3 oz), SpO2 97 %. No results found. No results for input(s): WBC, HGB, HCT, PLT in the last 72 hours. No results for input(s): NA, K, CL, GLUCOSE, BUN, CREATININE, CALCIUM in the last 72 hours.  Invalid input(s): CO CBG (last 3)  Recent Labs    12/11/17 1224  GLUCAP 104*    Wt Readings from Last 3 Encounters:  12/14/17 130.7 kg (288 lb 2.3 oz)  12/10/17 (!) 138.3 kg (304 lb 14.3 oz)  05/21/17 (!) 140.2 kg (309 lb)    Physical Exam:  Constitutional: He appears well-developed.  52 year old right-handed male NAD  HENT:  Head: Normocephalic and atraumatic.  Eyes: EOMI Neck: Normal range of motion. Neck supple. No thyromegaly present.  Cardiovascular: RRR without murmur. No JVD    Respiratory: normal effort GI: Soft. Bowel sounds are normal. He exhibits no distension.  Musculoskeletal:  No edema or tenderness in extremities  Neurological: He is alert.      Delayed processing.   Visual fields appear to be intact in all 4 quadrants although daily and depth perception remain impaired. Motor: RUE: 4/5 proximal to distal with ongoing ataxia RLE: 4/5 proximal to distal with ataxia LUE: 5/5 proximal to distal LLE: 4+ to 5/5 proximal to distal  Skin: Skin is warm and dry.  Groin and scrotal region with red, moistened tissue Vascular changes b/l LE  Psychiatric: Generally pleasant and appropriate   Assessment/Plan: 1.  Functional and mobility deficits secondary to left PCA infarction which require 3+ hours per day of interdisciplinary therapy in a comprehensive inpatient rehab setting. Physiatrist is providing  close team supervision and 24 hour management of active medical problems listed below. Physiatrist and rehab team continue to assess barriers to discharge/monitor patient progress toward functional and medical goals.  Function:  Bathing Bathing position Bathing activity did not occur: Refused Position: Production manager parts bathed by patient: Right arm, Left arm, Chest, Abdomen, Front perineal area, Right upper leg, Left upper leg, Right lower leg, Left lower leg Body parts bathed by helper: Buttocks, Back  Bathing assist Assist Level: (min A)      Upper Body Dressing/Undressing Upper body dressing   What is the patient wearing?: Pull over shirt/dress     Pull over shirt/dress - Perfomed by patient: Thread/unthread right sleeve, Thread/unthread left sleeve, Pull shirt over trunk, Put head through opening Pull over shirt/dress - Perfomed by helper: Put head through opening        Upper body assist Assist Level: Supervision or verbal cues      Lower Body Dressing/Undressing Lower body dressing   What is the patient wearing?: Pants, Non-skid slipper socks, Underwear Underwear - Performed by patient: Thread/unthread right underwear leg, Thread/unthread left underwear leg, Pull underwear up/down   Pants- Performed by patient: Thread/unthread right pants leg, Thread/unthread left pants leg, Pull pants up/down Pants- Performed by helper: Pull pants up/down Non-skid slipper socks- Performed by patient: Don/doff right sock, Don/doff left sock Non-skid slipper socks- Performed by helper: Don/doff right sock, Don/doff left sock(wife put socks on patient, he stated he could not per note)  Shoes - Performed by patient: Don/doff right shoe, Don/doff left shoe            Lower body assist Assist for lower body dressing: Touching or steadying assistance (Pt > 75%)      Toileting Toileting   Toileting steps completed by patient: Adjust clothing prior to toileting,  Performs perineal hygiene, Adjust clothing after toileting Toileting steps completed by helper: Adjust clothing prior to toileting, Performs perineal hygiene, Adjust clothing after toileting Toileting Assistive Devices: Grab bar or rail  Toileting assist Assist level: Touching or steadying assistance (Pt.75%)   Transfers Chair/bed transfer Chair/bed transfer activity did not occur: Safety/medical concerns Chair/bed transfer method: Stand pivot Chair/bed transfer assist level: Touching or steadying assistance (Pt > 75%)       Locomotion Ambulation     Max distance: 150 ft Assist level: Touching or steadying assistance (Pt > 75%)   Wheelchair Wheelchair activity did not occur: Safety/medical concerns        Cognition Comprehension Comprehension assist level: Understands complex 90% of the time/cues 10% of the time  Expression Expression assist level: Expresses basic 90% of the time/requires cueing < 10% of the time.  Social Interaction Social Interaction assist level: Interacts appropriately 90% of the time - Needs monitoring or encouragement for participation or interaction.  Problem Solving Problem solving assist level: Solves complex 90% of the time/cues < 10% of the time  Memory Memory assist level: Recognizes or recalls 50 - 74% of the time/requires cueing 25 - 49% of the time   Medical Problem List and Plan: 1.  Gait disorder with visual disturbance secondary to left PCA infarction.PCA occlusion status post thrombectomy and placement of loop recorder   -Continue PT, OT, ST therapies 2.  DVT Prophylaxis/Anticoagulation: Brilinta. Venous Doppler studies negative 3. Pain Management: Tylenol as needed 4. Mood: Provide emotional support 5. Neuropsych: This patient is not yet fully capable of making decisions on his own behalf. 6. Skin/Wound Care: Routine skin checks   -Nystatin powder to groin 7. Fluids/Electrolytes/Nutrition: Labs collected this morning and essentially within  normal limits except for low albumin.  Encourage p.o. intake.  Patient does not appear malnourished 79.History of hypertension. Cozaar 50 mg daily   -Diastolic blood pressure improved the last 24 hours---continue to follow 9.Morbid obesity. BMI 44.7. 10.OSA. Patient on CPAP at home 11. Clostridium difficile. Contact precautions. Vancomycin 125 mg 4 times daily 12/06/2017 for 14 days    -Stool consistency is improving  LOS (Days) 4 A FACE TO FACE EVALUATION WAS PERFORMED  Meredith Staggers, MD 12/14/2017 9:02 AM

## 2017-12-14 NOTE — Progress Notes (Signed)
Physical Therapy Session Note  Patient Details  Name: William Lowery MRN: [id number] Date of Birth: [birth year]  Today's Date: 12/14/2017 PT Individual Time: 0915-1015 PT Individual Time Calculation (min): 60 min   Short Term Goals: Week 1:  PT Short Term Goal 1 (Week 1): STG = LTG due to short ELOS.  Skilled Therapeutic Interventions/Progress Updates:    Pt seated in recliner upon therapist arrival. Pt reports no pain and is agreeable to participate in therapy session. Sit to stand SBA to RW. Ambulation 2 x 300 ft with RW with close SBA to CGA, verbal cues to attend to obstacles in right visual field. Standing balance activities: alt L/R cone taps with BUE support, 6" fwd and lateral step-taps with BUE support, Romberg stance x 30 sec no UE support, modified tandem stance x 30 sec with no UE support. Ascend/descend 12 stairs with 2HR and Min A, step-to gait pattern. Ambulation through obstacle course (cone navigation) with RW and CGA, minimal verbal cues to attend to obstacles in right visual field and for safe use of RW. Sit to/from stand 2 x 10 reps to RW for functional LE strengthening. Standing balance with close SBA while toileting. Pt left seated in recliner in room with needs in reach.  Therapy Documentation Precautions:  Precautions Precautions: Fall Restrictions Weight Bearing Restrictions: No  See Function Navigator for Current Functional Status.   Therapy/Group: Individual Therapy  Excell Seltzer, PT, DPT  12/14/2017, 12:01 PM

## 2017-12-14 NOTE — Progress Notes (Signed)
Speech Language Pathology Daily Session Note  Patient Details  Name: William Lowery MRN: [id number] Date of Birth: Sep 27, 1966  Today's Date: 12/14/2017 SLP Individual Time: 1500-1530 SLP Individual Time Calculation (min): 30 min  Short Term Goals: Week 1: SLP Short Term Goal 1 (Week 1): STGs = LTGs due to shorth length of stay  Skilled Therapeutic Interventions:  Pt was seen for skilled ST targeting language goals.  SLP facilitated the session with a previously taught verbal description task to address word finding.  SLP reviewed and reinforced compensatory word finding strategies.  Pt then utilized word finding strategies with min assist during the abovementioned task.  Pt was left in wheelchair with call bell within reach.  Continue per current plan of care.    Function:  Eating Eating                 Cognition Comprehension Comprehension assist level: Understands complex 90% of the time/cues 10% of the time  Expression   Expression assist level: Expresses basic 90% of the time/requires cueing < 10% of the time.  Social Interaction Social Interaction assist level: Interacts appropriately 90% of the time - Needs monitoring or encouragement for participation or interaction.  Problem Solving Problem solving assist level: Solves complex 90% of the time/cues < 10% of the time  Memory Memory assist level: Recognizes or recalls 75 - 89% of the time/requires cueing 10 - 24% of the time    Pain Pain Assessment Pain Assessment: No/denies pain  Therapy/Group: Individual Therapy  Lelan Cush, Selinda Orion 12/14/2017, 5:38 PM

## 2017-12-14 NOTE — Progress Notes (Signed)
Occupational Therapy Session Note  Patient Details  Name: William Lowery MRN: [id number] Date of Birth: 01-Dec-1965  Today's Date: 12/14/2017 OT Individual Time: 0867-6195 and 0932-6712 OT Individual Time Calculation (min): 59 min and 40 min   Short Term Goals: Week 1:  OT Short Term Goal 1 (Week 1): STGs=LTGs secondary to estimated short LOS  Skilled Therapeutic Interventions/Progress Updates:    Pt greeted supine in bed, no c/o pain. Agreeable to shower. Pt ambulated with RW and steady assist to dresser to gather clothing, then proceeded into bathroom to transfer to TTB. Pt bathing at sit<stand level with mod questioning cues for sequencing, with pt often looking at therapist and asking "what next?" He required cues to integrate R UE at nondominant level, able to use it to wash Lt arm and also to lather hair with shampoo. Afterwards pt ambulated EOB with RW and Min A. Thoroughly dried back using towel with bilateral UEinvolvement and cuing. Pt scanning to Rt side for necessary items without assist. Min vcs for sequencing/problem solving when donning clothes and also while standing at sink to complete oral care/grooming tasks. During ambulation in room with device, pt tended to release grip of Rt hand and required cues to acknowledge and correct 25% of time. For last ten minutes of session, taught him theraputty exercises focusing on intrinsic muscle strengthening Rt hand while sitting in recliner. At end of tx pt was left in recliner (used R UE to pull lever and elevate feet). All needs within reach.     2nd Session 1:1 tx (40 min) Pt greeted in w/c with NT present. Agreeable to tx. After completed handwashing, pt ambulated with RW and Min A to dayroom. 1 LOB while transferring to armless chair with Mod A to correct. While seated, pt engaged in bilaterally integrative task of tapping/clapping in beat to favorite music. Tx focus on coordination and Rt NMR. Pt required mod vcs to increase amplitude of  movement with R UE.  Decreased proprioceptive awareness without visual attendance to Rt sided movement. Activity upgraded to include overhead claps and combination of thigh-taps with forward claps. Pt required cues to visually attend to Rt hand to improve quality of movement and control. Throughout session, pt closing eyes and mouthing words to songs, appearing to experience flow and therefore enhancing therapeutic effects of tx. Afterwards pt ambulated back to room in manner as written above. He was left in w/c with all needs within reach at time of departure.   Therapy Documentation Precautions:  Precautions Precautions: Fall Restrictions Weight Bearing Restrictions: No Vital Signs: Therapy Vitals Temp: 98.5 F (36.9 C) Temp Source: Oral Pulse Rate: 72 Resp: 18 BP: 131/86 Patient Position (if appropriate): Sitting Oxygen Therapy SpO2: 97 % O2 Device: Not Delivered Pain: No c/o pain during session    ADL:  :    See Function Navigator for Current Functional Status.   Therapy/Group: Individual Therapy  Caliope Ruppert A Stavros Cail 12/14/2017, 4:27 PM

## 2017-12-15 ENCOUNTER — Encounter (HOSPITAL_COMMUNITY): Payer: Self-pay

## 2017-12-15 ENCOUNTER — Inpatient Hospital Stay (HOSPITAL_COMMUNITY): Payer: Self-pay | Admitting: Speech Pathology

## 2017-12-15 ENCOUNTER — Inpatient Hospital Stay (HOSPITAL_COMMUNITY): Payer: PRIVATE HEALTH INSURANCE | Admitting: Physical Therapy

## 2017-12-15 ENCOUNTER — Inpatient Hospital Stay (HOSPITAL_COMMUNITY): Payer: Self-pay | Admitting: Physical Therapy

## 2017-12-15 ENCOUNTER — Inpatient Hospital Stay (HOSPITAL_COMMUNITY): Payer: Self-pay | Admitting: Occupational Therapy

## 2017-12-15 DIAGNOSIS — I10 Essential (primary) hypertension: Secondary | ICD-10-CM

## 2017-12-15 DIAGNOSIS — G4733 Obstructive sleep apnea (adult) (pediatric): Secondary | ICD-10-CM

## 2017-12-15 DIAGNOSIS — E46 Unspecified protein-calorie malnutrition: Secondary | ICD-10-CM

## 2017-12-15 DIAGNOSIS — E8809 Other disorders of plasma-protein metabolism, not elsewhere classified: Secondary | ICD-10-CM

## 2017-12-15 MED ORDER — PRO-STAT SUGAR FREE PO LIQD
30.0000 mL | Freq: Two times a day (BID) | ORAL | Status: DC
  Administered 2017-12-15 – 2017-12-19 (×9): 30 mL via ORAL
  Filled 2017-12-15 (×9): qty 30

## 2017-12-15 NOTE — Progress Notes (Signed)
[place] PHYSICAL MEDICINE & REHABILITATION     PROGRESS NOTE    Subjective/Complaints: Patient seen lying in bed this morning. He states he slept well overnight.  ROS: Denies nausea, vomiting, diarrhea, shortness of breath or chest pain    Objective: Vital Signs: Blood pressure 111/78, pulse 81, temperature 98.2 F (36.8 C), temperature source Oral, resp. rate 18, weight 130 kg (286 lb 9.6 oz), SpO2 96 %. No results found. No results for input(s): WBC, HGB, HCT, PLT in the last 72 hours. No results for input(s): NA, K, CL, GLUCOSE, BUN, CREATININE, CALCIUM in the last 72 hours.  Invalid input(s): CO CBG (last 3)  No results for input(s): GLUCAP in the last 72 hours.  Wt Readings from Last 3 Encounters:  12/15/17 130 kg (286 lb 9.6 oz)  12/10/17 (!) 138.3 kg (304 lb 14.3 oz)  05/21/17 (!) 140.2 kg (309 lb)    Physical Exam:  Constitutional: He appears well-developed and well-nourished.  HENT: Normocephalic and atraumatic.  Eyes: EOMI. No discharge. Cardiovascular: RRR. No JVD    Respiratory: normal effort. Clear. GI: Bowel sounds are normal. He exhibits no distension.  Musculoskeletal: No edema or tenderness in extremities  Neurological: He is alert.    Delayed processing.    Motor: RUE: 4+/5 proximal to distal with ongoing ataxia RLE: 4+/5 proximal to distal with ataxia LUE: 5/5 proximal to distal LLE: 5/5 proximal to distal  Skin: Skin is warm and dry.   Vascular changes b/l LE  Psychiatric: Generally pleasant and appropriate   Assessment/Plan: 1.  Functional and mobility deficits secondary to left PCA infarction which require 3+ hours per day of interdisciplinary therapy in a comprehensive inpatient rehab setting. Physiatrist is providing close team supervision and 24 hour management of active medical problems listed below. Physiatrist and rehab team continue to assess barriers to discharge/monitor patient progress toward functional and medical  goals.  Function:  Bathing Bathing position Bathing activity did not occur: Refused Position: Production manager parts bathed by patient: Right arm, Left arm, Chest, Abdomen, Front perineal area, Right upper leg, Left upper leg, Right lower leg, Left lower leg, Buttocks Body parts bathed by helper: Back  Bathing assist Assist Level: Touching or steadying assistance(Pt > 75%)      Upper Body Dressing/Undressing Upper body dressing   What is the patient wearing?: Pull over shirt/dress     Pull over shirt/dress - Perfomed by patient: Thread/unthread right sleeve, Thread/unthread left sleeve, Pull shirt over trunk, Put head through opening Pull over shirt/dress - Perfomed by helper: Put head through opening        Upper body assist Assist Level: Supervision or verbal cues      Lower Body Dressing/Undressing Lower body dressing   What is the patient wearing?: Pants, Non-skid slipper socks, Underwear Underwear - Performed by patient: Thread/unthread right underwear leg, Thread/unthread left underwear leg, Pull underwear up/down   Pants- Performed by patient: Thread/unthread right pants leg, Thread/unthread left pants leg, Pull pants up/down Pants- Performed by helper: Pull pants up/down Non-skid slipper socks- Performed by patient: Don/doff right sock, Don/doff left sock Non-skid slipper socks- Performed by helper: Don/doff right sock, Don/doff left sock(wife put socks on patient, he stated he could not per note)     Shoes - Performed by patient: Don/doff right shoe, Don/doff left shoe            Lower body assist Assist for lower body dressing: Touching or steadying assistance (Pt > 75%)  Toileting Toileting   Toileting steps completed by patient: Adjust clothing prior to toileting, Performs perineal hygiene, Adjust clothing after toileting Toileting steps completed by helper: Adjust clothing prior to toileting, Performs perineal hygiene, Adjust clothing after  toileting Toileting Assistive Devices: Grab bar or rail  Toileting assist Assist level: Touching or steadying assistance (Pt.75%)   Transfers Chair/bed transfer Chair/bed transfer activity did not occur: Safety/medical concerns Chair/bed transfer method: Ambulatory Chair/bed transfer assist level: Touching or steadying assistance (Pt > 75%) Chair/bed transfer assistive device: Walker, Air cabin crew     Max distance: 300 ft Assist level: Touching or steadying assistance (Pt > 75%)   Wheelchair Wheelchair activity did not occur: Safety/medical concerns        Cognition Comprehension Comprehension assist level: Understands complex 90% of the time/cues 10% of the time  Expression Expression assist level: Expresses basic 90% of the time/requires cueing < 10% of the time.  Social Interaction Social Interaction assist level: Interacts appropriately 90% of the time - Needs monitoring or encouragement for participation or interaction.  Problem Solving Problem solving assist level: Solves complex 90% of the time/cues < 10% of the time  Memory Memory assist level: Recognizes or recalls 75 - 89% of the time/requires cueing 10 - 24% of the time   Medical Problem List and Plan: 1.  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  DVT Prophylaxis/Anticoagulation: Brilinta. Venous Doppler studies negative 3. Pain Management: Tylenol as needed 4. Mood: Provide emotional support 5. Neuropsych: This patient is not yet fully capable of making decisions on his own behalf. 6. Skin/Wound Care: Routine skin checks   -Nystatin powder to groin 7. Fluids/Electrolytes/Nutrition: Labs collected this morning and essentially within normal limits except for low albumin.  Encourage p.o. intake.  Patient does not appear malnourished 66.History of hypertension. Cozaar 50 mg daily    Controlled on 2/9 9.Morbid obesity. BMI 44.7. 10.OSA. Patient on CPAP at home 11. Clostridium difficile. Contact precautions. Vancomycin 125 mg 4 times daily 12/06/2017 for 14 days    -Stool consistency is improving 12. Hypoalbuminemia   Supplement initiated on 2/5   LOS (Days) 5 A FACE TO FACE EVALUATION WAS PERFORMED  Ankit Lorie Phenix, MD 12/15/2017 8:08 AM

## 2017-12-15 NOTE — Progress Notes (Signed)
Speech Language Pathology Daily Session Note  Patient Details  Name: William Lowery MRN: [id number] Date of Birth: [birth year]  Today's Date: 12/15/2017 SLP Individual Time: 6503-5465 SLP Individual Time Calculation (min): 45 min  Short Term Goals: Week 1: SLP Short Term Goal 1 (Week 1): STGs = LTGs due to shorth length of stay  Skilled Therapeutic Interventions:   Pt was seen for skilled ST targeting cognitive goals.  SLP facilitated the session with a semi-complex deductive reasoning/scheduling task.  Pt completed task with supervision cues for attention to detail and error awareness.  Pt was left in wheelchair with call bell within reach.  Continue per current plan of care.     Function:  Eating Eating                 Cognition Comprehension Comprehension assist level: Understands complex 90% of the time/cues 10% of the time  Expression   Expression assist level: Expresses basic 90% of the time/requires cueing < 10% of the time.  Social Interaction Social Interaction assist level: Interacts appropriately 90% of the time - Needs monitoring or encouragement for participation or interaction.  Problem Solving Problem solving assist level: Solves complex 90% of the time/cues < 10% of the time  Memory Memory assist level: Recognizes or recalls 90% of the time/requires cueing < 10% of the time    Pain Pain Assessment Pain Assessment: No/denies pain  Therapy/Group: Individual Therapy  Jaylinn Hellenbrand, Selinda Orion 12/15/2017, 8:48 AM

## 2017-12-15 NOTE — Progress Notes (Signed)
Physical Therapy Session Note  Patient Details  Name: William Lowery MRN: [id number] Date of Birth: 11/04/1966  Today's Date: 12/15/2017 PT Individual Time: 1400-1430 PT Individual Time Calculation (min): 30 min   Short Term Goals: Week 1:  PT Short Term Goal 1 (Week 1): STG = LTG due to short ELOS.  Skilled Therapeutic Interventions/Progress Updates:    Pt supine in bed upon therapist arrival, pt reports no pain and is agreeable to participate in therapy session. Supine to sit with SBA, sit to stand SBA to RW. Ambulation 2 x 300 ft with RW and SBA, occasional cues to attend to R side. Side-steps with BUE support and SBA for LE strengthening and balance. Trial gait with one UE support on handrail with min assist for balance x 50 ft. Trial gait with no UE support and min assist x 50 ft with focus on arm swing. Trial gait with SPC with 3-pt gait pattern x 50 ft, min assist for balance. Trial gait with SPC and 2-pt gait pattern x 50 ft, mod assist and several instances of near LOB. Pt exhibits improved balance with 3-pt gait pattern with SPC, will continue to trial SPC vs RW during therapy sessions. Standing balance with SBA while using urinal. Pt left seated in recliner in room with needs in reach.  Therapy Documentation Precautions:  Precautions Precautions: Fall Restrictions Weight Bearing Restrictions: No  See Function Navigator for Current Functional Status.   Therapy/Group: Individual Therapy  Excell Seltzer, PT, DPT  12/15/2017, 3:55 PM

## 2017-12-15 NOTE — Progress Notes (Signed)
Occupational Therapy Session Note  Patient Details  Name: William Lowery MRN: [id number] Date of Birth: 1966/04/24  Today's Date: 12/15/2017 OT Individual Time: 7001-7494 OT Individual Time Calculation (min): 58 min   Short Term Goals: Week 1:  OT Short Term Goal 1 (Week 1): STGs=LTGs secondary to estimated short LOS  Skilled Therapeutic Interventions/Progress Updates:    Pt greeted in w/c, no c/o pain and agreeable to shower. Had pt sequence steps for ADL completion with questioning cues today. He ambulated to dresser with Min A and RW to retrieve clothing items, stooping to 2nd drawer to look for pants. Also retrieved towels and wash cloths from chair. Min vcs for scanning to Rt for necessary items. Pt requesting to void prior to shower. After continent B+B in toilet, worked on Dietitian with pt completing pericare with L UE with cues for thoroughness. He then initiated transfer to TTB with instruction on technique and DME mgt. Pt bathing at sit<stand level, utilizing figure 4 for washing both feet, reaching for items with Rt hand, and using L UE to thoroughly wash buttocks with extra time and cues. He then ambulated to EOB to dress. Pt able to self organize with min vcs. Oral care/grooming tasks completed in standing with pt using Rt hand at nondominant level. Pt then ambulated to bedside recliner and was able to reposition himself. For remainder of time, worked on bilateral coordination/Rt NMR with pt engaging in laundry folding tasks (per pt, he helps spouse with this at home). At end of tx he was left with all needs within reach.   Therapy Documentation Precautions:  Precautions Precautions: Fall Restrictions Weight Bearing Restrictions: No Pain: Pain Assessment Pain Assessment: No/denies pain ADL:      See Function Navigator for Current Functional Status.   Therapy/Group: Individual Therapy  [name] [name] 12/15/2017, 12:22 PM

## 2017-12-15 NOTE — Progress Notes (Addendum)
Physical Therapy Session Note  Patient Details  Name: William Lowery MRN: [id number] Date of Birth: 03/18/1966  Today's Date: 12/15/2017 PT Individual Time: 4401-0272 PT Individual Time Calculation (min): 60 min   Short Term Goals: Week 1:  PT Short Term Goal 1 (Week 1): STG = LTG due to short ELOS.  Skilled Therapeutic Interventions/Progress Updates:  Pt received in room & agreeable to tx. No c/o pain reported. Gait room<>gym with RW & supervision with min cuing for attention to R. Pt transferred to supine on mat table and performed BLE bridging and RLE single leg bridging with cuing for technique with task focusing on strengthening & NMR. Pt reported dizziness with sit>supine and supine>sit but this fades with rest. Pt engaged in trampoline ball toss with wide & narrow BOS with min assist<>steady assist for balance; activity focused on balance and RUE coordination and pt initially experienced 2 LOB. Therapist provided instruction and pt negotiated 4 steps backwards x 2 trials with RW and min assist; pt initially requires cuing for compensatory pattern. Pt then engaged in taking tops on/off medicine bottles as pt reports he has difficulty turning objects to open shampoo/soap. Pt requires cuing for technique to focus on R NMR; pt becomes easily frustrated with inability to perform task. Pt returned to room & left sitting in recliner with all needs within reach & wife present to supervise.   Addendum: Wife reports she has been assisting pt to bathroom - educated both pt & wife on need to be checked of by therapy first.   Therapy Documentation Precautions:  Precautions Precautions: Fall Restrictions Weight Bearing Restrictions: No   See Function Navigator for Current Functional Status.   Therapy/Group: Individual Therapy  [name] 12/15/2017, 12:26 PM

## 2017-12-16 ENCOUNTER — Inpatient Hospital Stay (HOSPITAL_COMMUNITY): Payer: Self-pay | Admitting: Occupational Therapy

## 2017-12-16 NOTE — Progress Notes (Signed)
Occupational Therapy Session Note  Patient Details  Name: William Lowery MRN: [id number] Date of Birth: 1966/01/04  Today's Date: 12/16/2017 OT Individual Time: 1001-1100 OT Individual Time Calculation (min): 59 min    Short Term Goals: Week 1:  OT Short Term Goal 1 (Week 1): STGs=LTGs secondary to estimated short LOS  Skilled Therapeutic Interventions/Progress Updates:    Pt greeted in bed. When asked what he'd like to do in therapy, responded "shower, right?" Pt ambulating to dresser with RW to gather clothing items with close supervision and cues to use R UE for reaching. Questioning cues utilized for problem solving in bathroom in regards to setup of shower and where to doff clothing. Pt still appears puzzled when OT instructs him to sit to undress/dress LB. Continued to educate him on safety and present deficits for carryover of understanding. Pt transferred to TTB with Min A and cues for DME mgt. He sequenced bathing tasks today with 1 questioning cue! Close supervision while standing to complete perihygiene and pt able to reach buttocks using L UE. Still unable to IR R UE fully to reach on Rt side. Afterwards he initiated ambulation towards EOB to dress. Pt sequencing with min vcs, supervision sit<stand with RW for elevating LB garments. Grooming/oral care tasks completed with standing with focus on bilateral integration for Rt NMR. He then ambulated to recliner placed at bedside. Provided him with several R UE HEPs to engage in while in room. Pt left with all needs within reach, using fidget spinner with Rt hand.   Therapy Documentation Precautions:  Precautions Precautions: Fall Restrictions Weight Bearing Restrictions: No Vital Signs: Therapy Vitals Pulse Rate: 64 Resp: 18 BP: (!) 129/91 Patient Position (if appropriate): Lying Oxygen Therapy SpO2: 99 % Pain: No c/o pain during session    ADL:     See Function Navigator for Current Functional Status.   Therapy/Group:  Individual Therapy  Valissa Lyvers A Ashantia Amaral 12/16/2017, 5:28 PM

## 2017-12-16 NOTE — Progress Notes (Signed)
[place] PHYSICAL MEDICINE & REHABILITATION     PROGRESS NOTE    Subjective/Complaints: Patient seen lying in bed this morning. He states he slept well overnight. He denies complaints this morning.  ROS: Denies nausea, vomiting, diarrhea, shortness of breath or chest pain    Objective: Vital Signs: Blood pressure 131/86, pulse 80, temperature 99.1 F (37.3 C), temperature source Oral, resp. rate 18, weight 129.4 kg (285 lb 4.4 oz), SpO2 97 %. No results found. No results for input(s): WBC, HGB, HCT, PLT in the last 72 hours. No results for input(s): NA, K, CL, GLUCOSE, BUN, CREATININE, CALCIUM in the last 72 hours.  Invalid input(s): CO CBG (last 3)  No results for input(s): GLUCAP in the last 72 hours.  Wt Readings from Last 3 Encounters:  12/16/17 129.4 kg (285 lb 4.4 oz)  12/10/17 (!) 138.3 kg (304 lb 14.3 oz)  05/21/17 (!) 140.2 kg (309 lb)    Physical Exam:  Constitutional: He appears well-developed and well-nourished.  HENT: Normocephalic and atraumatic.  Eyes: EOMI. No discharge. Cardiovascular: RRR. No JVD    Respiratory: normal effort. Clear. GI: Bowel sounds are normal. He exhibits no distension.  Musculoskeletal: No edema or tenderness in extremities  Neurological: He is alert.    Delayed processing.    Motor: RUE:  4+/5 proximal to distal with ataxia RLE: 4+/5 proximal to distal  LUE: 5/5 proximal to distal LLE: 5/5 proximal to distal  Skin: Skin is warm and dry.   Vascular changes b/l LE  Psychiatric: Generally pleasant and appropriate   Assessment/Plan: 1.  Functional and mobility deficits secondary to left PCA infarction which require 3+ hours per day of interdisciplinary therapy in a comprehensive inpatient rehab setting. Physiatrist is providing close team supervision and 24 hour management of active medical problems listed below. Physiatrist and rehab team continue to assess barriers to discharge/monitor patient progress toward functional and  medical goals.  Function:  Bathing Bathing position Bathing activity did not occur: Refused Position: Production manager parts bathed by patient: Right arm, Left arm, Chest, Abdomen, Front perineal area, Right upper leg, Left upper leg, Right lower leg, Left lower leg, Buttocks Body parts bathed by helper: Back  Bathing assist Assist Level: Touching or steadying assistance(Pt > 75%)      Upper Body Dressing/Undressing Upper body dressing   What is the patient wearing?: Pull over shirt/dress     Pull over shirt/dress - Perfomed by patient: Thread/unthread right sleeve, Thread/unthread left sleeve, Pull shirt over trunk, Put head through opening Pull over shirt/dress - Perfomed by helper: Put head through opening        Upper body assist Assist Level: Supervision or verbal cues      Lower Body Dressing/Undressing Lower body dressing   What is the patient wearing?: Pants, Non-skid slipper socks, Underwear Underwear - Performed by patient: Thread/unthread right underwear leg, Thread/unthread left underwear leg, Pull underwear up/down   Pants- Performed by patient: Thread/unthread right pants leg, Thread/unthread left pants leg, Pull pants up/down Pants- Performed by helper: Pull pants up/down Non-skid slipper socks- Performed by patient: Don/doff right sock, Don/doff left sock Non-skid slipper socks- Performed by helper: Don/doff right sock, Don/doff left sock(wife put socks on patient, he stated he could not per note)     Shoes - Performed by patient: Don/doff right shoe, Don/doff left shoe            Lower body assist Assist for lower body dressing: Touching or steadying assistance (Pt >  75%)      Toileting Toileting   Toileting steps completed by patient: Adjust clothing prior to toileting, Performs perineal hygiene, Adjust clothing after toileting Toileting steps completed by helper: Adjust clothing prior to toileting, Performs perineal hygiene, Adjust clothing  after toileting Toileting Assistive Devices: Grab bar or rail  Toileting assist Assist level: Touching or steadying assistance (Pt.75%)   Transfers Chair/bed transfer Chair/bed transfer activity did not occur: Safety/medical concerns Chair/bed transfer method: Ambulatory Chair/bed transfer assist level: Supervision or verbal cues Chair/bed transfer assistive device: Armrests, Medical sales representative     Max distance: 300 ft Assist level: Supervision or verbal cues   Wheelchair Wheelchair activity did not occur: Safety/medical concerns        Cognition Comprehension Comprehension assist level: Understands complex 90% of the time/cues 10% of the time  Expression Expression assist level: Expresses complex 90% of the time/cues < 10% of the time  Social Interaction Social Interaction assist level: Interacts appropriately 90% of the time - Needs monitoring or encouragement for participation or interaction.  Problem Solving Problem solving assist level: Solves complex 90% of the time/cues < 10% of the time  Memory Memory assist level: Recognizes or recalls 90% of the time/requires cueing < 10% of the time   Medical Problem List and Plan: 1.  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  DVT Prophylaxis/Anticoagulation: Brilinta. Venous Doppler studies negative 3. Pain Management: Tylenol as needed 4. Mood: Provide emotional support 5. Neuropsych: This patient is not yet fully capable of making decisions on his own behalf. 6. Skin/Wound Care: Routine skin checks   -Nystatin powder to groin 7. Fluids/Electrolytes/Nutrition:    Encourage p.o. intake.  Patient does not appear malnourished 74.History of hypertension. Cozaar 50 mg daily   Controlled on 2/10 9.Morbid obesity. BMI 44.7. 10.OSA. Patient on CPAP at home 11. Clostridium difficile. Contact precautions.  Vancomycin 125 mg 4 times daily 12/06/2017 for 14 days    -Stool consistency is improving 12. Hypoalbuminemia   Supplement initiated on 2/5   LOS (Days) 6 A FACE TO FACE EVALUATION WAS PERFORMED  Shakeela Rabadan Lorie Phenix, MD 12/16/2017 7:16 AM

## 2017-12-17 ENCOUNTER — Inpatient Hospital Stay (HOSPITAL_COMMUNITY): Payer: Self-pay | Admitting: Occupational Therapy

## 2017-12-17 ENCOUNTER — Inpatient Hospital Stay (HOSPITAL_COMMUNITY): Payer: PRIVATE HEALTH INSURANCE | Admitting: Physical Therapy

## 2017-12-17 ENCOUNTER — Inpatient Hospital Stay (HOSPITAL_COMMUNITY): Payer: PRIVATE HEALTH INSURANCE | Admitting: Speech Pathology

## 2017-12-17 ENCOUNTER — Inpatient Hospital Stay (HOSPITAL_COMMUNITY): Payer: PRIVATE HEALTH INSURANCE | Admitting: Occupational Therapy

## 2017-12-17 NOTE — Progress Notes (Signed)
Physical Therapy Session Note  Patient Details  Name: William Lowery MRN: [id number] Date of Birth: [birth year]  Today's Date: 12/17/2017 PT Individual Time: 1304-1400 PT Individual Time Calculation (min): 56 min   Short Term Goals: Week 1:  PT Short Term Goal 1 (Week 1): STG = LTG due to short ELOS.  Skilled Therapeutic Interventions/Progress Updates:   Pt received in room with wife [name]) present for session. No c/o pain reported. Session focused on caregiver education with therapist providing instructional education and demonstration to wife for positioning when ambulating with pt and assisting him with stair negotiation. Pt negotiates 4 steps x 2 trials backwards with RW & min assist with only intermittent cuing for compensatory pattern. Pt's wife able to safely assist him with task and provides adequate cuing to attend to R side during gait. Pt completed car transfer at [facility] simulated height with supervision with cuing to sit then transfer LE in/out of car. Pt negotiated curb with RW & min assist with pt & wife return demonstrating. Provided pt & wife with energy conservation handout and discussed various strategies. Encouraged pt to continue ambulating upon d/c but be aware of his activity tolerance. Pt voices concern regarding getting in/out of shower at home but will practice this with OT tomorrow. Wife voices no concerns regarding d/c home. Educated her on home modifications (remove or secure throw rugs on floor, remove all obstacles). At end of session pt left in w/c in room with wife present to supervise.   Therapy Documentation Precautions:  Precautions Precautions: Fall Restrictions Weight Bearing Restrictions: No   See Function Navigator for Current Functional Status.   Therapy/Group: Individual Therapy  [name] 12/17/2017, 4:19 PM

## 2017-12-17 NOTE — Progress Notes (Signed)
Occupational Therapy Session Note  Patient Details  Name: William Lowery MRN: [id number] Date of Birth: 1966/01/28  Today's Date: 12/17/2017 OT Individual Time: 1415-1500 OT Individual Time Calculation (min): 45 min    Short Term Goals: Week 1:  OT Short Term Goal 1 (Week 1): STGs=LTGs secondary to estimated short LOS  Skilled Therapeutic Interventions/Progress Updates:    Upon entering the room, pt supine in bed with wife present in room. Pt ambulating with RW to day room with overall supervision. Pt playing putt putt golf without use of RW and without LOB while hitting balls. Pt able to bend down and pick up ball from floor with close supervision for safety. Pt taking multiple rest breaks secondary to fatigue. Pt ambulating 100' without use of RW and close supervision without LOB. Pt given crate with golf items places inside and pt carried it to closet 34' with supervision but pt quickly tiring with additional weight and taking rest break after reaching destination. Pt returning to room with RW and returns to sit on EOB with wife present in room. Call bell and all needed items within reach upon exiting the room.   Therapy Documentation Precautions:  Precautions Precautions: Fall Restrictions Weight Bearing Restrictions: No General:   Vital Signs: Therapy Vitals Temp: 98.1 F (36.7 C) Temp Source: Oral Pulse Rate: 95 Resp: 18 BP: 124/90 Patient Position (if appropriate): Sitting Oxygen Therapy SpO2: 97 % O2 Device: Not Delivered  See Function Navigator for Current Functional Status.   Therapy/Group: Individual Therapy  Gypsy Decant 12/17/2017, 4:26 PM

## 2017-12-17 NOTE — Progress Notes (Signed)
[place] PHYSICAL MEDICINE & REHABILITATION     PROGRESS NOTE    Subjective/Complaints: Patient sitting up in bed.  Denies any new problems.  Has no headaches or pain.  Sleeping well  ROS: pt denies nausea, vomiting, diarrhea, cough, shortness of breath or chest pain    Objective: Vital Signs: Blood pressure 111/74, pulse 77, temperature 98.8 F (37.1 C), temperature source Oral, resp. rate 18, weight 129.8 kg (286 lb 2.5 oz), SpO2 97 %. No results found. No results for input(s): WBC, HGB, HCT, PLT in the last 72 hours. No results for input(s): NA, K, CL, GLUCOSE, BUN, CREATININE, CALCIUM in the last 72 hours.  Invalid input(s): CO CBG (last 3)  No results for input(s): GLUCAP in the last 72 hours.  Wt Readings from Last 3 Encounters:  12/17/17 129.8 kg (286 lb 2.5 oz)  12/10/17 (!) 138.3 kg (304 lb 14.3 oz)  05/21/17 (!) 140.2 kg (309 lb)    Physical Exam:  Constitutional: He appears well-developed and well-nourished.  HENT: Normocephalic and atraumatic.  Eyes: EOMI. No discharge. Cardiovascular: RRR without murmur. No JVD     Respiratory: normal effort. GI: Bowel sounds are normal. He exhibits no distension.  Musculoskeletal: No edema or tenderness in extremities  Neurological: He is alert.    Delayed processing.    Motor: RUE:  4+/5 proximal to distal with ataxia RLE: 4+/5 proximal to distal  LUE: 5/5 proximal to distal LLE: 5/5 proximal to distal  Skin: Skin is warm and dry.   Vascular changes b/l LE  Psychiatric: Generally pleasant and appropriate   Assessment/Plan: 1.  Functional and mobility deficits secondary to left PCA infarction which require 3+ hours per day of interdisciplinary therapy in a comprehensive inpatient rehab setting. Physiatrist is providing close team supervision and 24 hour management of active medical problems listed below. Physiatrist and rehab team continue to assess barriers to discharge/monitor patient progress toward functional  and medical goals.  Function:  Bathing Bathing position Bathing activity did not occur: Refused Position: Shower  Bathing parts Body parts bathed by patient: Right arm, Left arm, Chest, Abdomen, Front perineal area, Right upper leg, Left upper leg, Right lower leg, Left lower leg, Buttocks Body parts bathed by helper: Back  Bathing assist Assist Level: Supervision or verbal cues      Upper Body Dressing/Undressing Upper body dressing   What is the patient wearing?: Pull over shirt/dress     Pull over shirt/dress - Perfomed by patient: Thread/unthread right sleeve, Thread/unthread left sleeve, Pull shirt over trunk, Put head through opening Pull over shirt/dress - Perfomed by helper: Put head through opening        Upper body assist Assist Level: Set up      Lower Body Dressing/Undressing Lower body dressing   What is the patient wearing?: Pants, Underwear, Socks, Shoes Underwear - Performed by patient: Thread/unthread right underwear leg, Thread/unthread left underwear leg, Pull underwear up/down   Pants- Performed by patient: Thread/unthread right pants leg, Thread/unthread left pants leg, Pull pants up/down Pants- Performed by helper: Pull pants up/down Non-skid slipper socks- Performed by patient: Don/doff right sock, Don/doff left sock Non-skid slipper socks- Performed by helper: Don/doff right sock, Don/doff left sock(wife put socks on patient, he stated he could not per note) Socks - Performed by patient: Don/doff right sock, Don/doff left sock   Shoes - Performed by patient: Don/doff right shoe, Don/doff left shoe            Lower body assist Assist  for lower body dressing: Supervision or verbal cues      Toileting Toileting   Toileting steps completed by patient: Adjust clothing prior to toileting, Performs perineal hygiene, Adjust clothing after toileting Toileting steps completed by helper: Adjust clothing prior to toileting, Performs perineal hygiene, Adjust  clothing after toileting Toileting Assistive Devices: Grab bar or rail  Toileting assist Assist level: Supervision or verbal cues   Transfers Chair/bed transfer Chair/bed transfer activity did not occur: Safety/medical concerns Chair/bed transfer method: Ambulatory Chair/bed transfer assist level: Supervision or verbal cues Chair/bed transfer assistive device: Armrests, Medical sales representative     Max distance: 300 ft Assist level: Supervision or verbal cues   Wheelchair Wheelchair activity did not occur: Safety/medical concerns        Cognition Comprehension Comprehension assist level: Understands complex 90% of the time/cues 10% of the time  Expression Expression assist level: Expresses complex 90% of the time/cues < 10% of the time  Social Interaction Social Interaction assist level: Interacts appropriately 90% of the time - Needs monitoring or encouragement for participation or interaction.  Problem Solving Problem solving assist level: Solves basic 90% of the time/requires cueing < 10% of the time  Memory Memory assist level: Recognizes or recalls 90% of the time/requires cueing < 10% of the time   Medical Problem List and Plan: 1.  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  DVT Prophylaxis/Anticoagulation: Brilinta. Venous Doppler studies negative 3. Pain Management: Tylenol as needed 4. Mood: Provide emotional support 5. Neuropsych: This patient is not yet fully capable of making decisions on his own behalf. 6. Skin/Wound Care: Routine skin checks   -Nystatin powder to groin 7. Fluids/Electrolytes/Nutrition:    Encourage p.o. intake.  Patient does not appear malnourished 10.History of hypertension. Cozaar 50 mg daily   Controlled on 2/11 9.Morbid obesity. BMI 44.7. 10.OSA. Patient on CPAP at home 11. Clostridium difficile. Contact precautions.  Vancomycin 125 mg 4 times daily 12/06/2017 for 14 days    -Stool consistency is improving 12. Hypoalbuminemia   Supplement initiated on 2/5   LOS (Days) 7 A FACE TO FACE EVALUATION WAS PERFORMED  Meredith Staggers, MD 12/17/2017 8:35 AM

## 2017-12-17 NOTE — Progress Notes (Signed)
Occupational Therapy Session Note  Patient Details  Name: William Lowery MRN: [id number] Date of Birth: 12-27-1965  Today's Date: 12/17/2017 OT Individual Time: 9163-8466 OT Individual Time Calculation (min): 60 min   Short Term Goals: Week 1:  OT Short Term Goal 1 (Week 1): STGs=LTGs secondary to estimated short LOS  Skilled Therapeutic Interventions/Progress Updates:    Pt greeted supine in bed, requesting shower. Tx focus on sequencing, problem solving, and ADL retraining. He ambulated with RW and supervision to sink to brush teeth, using Rt hand to manage toothbrush. He then gathered clothing items at [employer] with cues to reach with R UE and also for safe DME mgt. Once in bathroom, pt reported need to void bladder and required questioning cues to figure out when to sit down and where to place walker. Questioning cues required for finding adequate spot to sit to doff clothing with pt attempting to doff shoes while standing. He initiated transfer to shower afterwards with min guard. He showered at sit<stand level with overall close supervision while standing to complete pericare. Pt incorporating R UE throughout bathing tasks without cues. Afterwards he dressed while seated EOB, standing as needed with RW. Pt self organizing with min-mod questioning cues. After completing grooming tasks while standing at sink, he ambulated to bedside recliner. Pt repositioned himself in chair with instruction using R UE. At end of tx pt was left with all needs within reach.    Therapy Documentation Precautions:  Precautions Precautions: Fall Restrictions Weight Bearing Restrictions: No Pain: Back pain. Pt declining for OT to notify RN. He said it was probably from lying in bed since 8pm last night Pain Assessment Pain Assessment: No/denies pain Pain Score: 0-No pain ADL:     See Function Navigator for Current Functional Status.   Therapy/Group: Individual Therapy  William Lowery 12/17/2017,  12:50 PM

## 2017-12-17 NOTE — Progress Notes (Signed)
Speech Language Pathology Daily Session Note  Patient Details  Name: William Lowery MRN: [id number] Date of Birth: 03/02/1966  Today's Date: 12/17/2017 SLP Individual Time: 1100-1200 SLP Individual Time Calculation (min): 60 min  Short Term Goals: Week 1: SLP Short Term Goal 1 (Week 1): STGs = LTGs due to shorth length of stay  Skilled Therapeutic Interventions: Skilled treatment session focused on cognitive and communication goals. SLP facilitated session by providing supervision verbal cues for problem solving during a mildly complex 4-step picture sequencing task. Pt demonstrated verbal expression of picture descriptions with supervision verbal cues for word-finding at the sentence level. SLP further facilitated session by providing Mod A verbal cues to self-monitor and correct errors with written expression for spelling and syntax. SLP educated pt's wife in regards to current cognitive-linguistic impairments and goals of skilled SLP intervention. Pt and wife verbalized understanding and agreement. Pt left upright in wheelchair with all needs within reach. Continue with current plan of care.   Function:  Cognition Comprehension Comprehension assist level: Follows basic conversation/direction with extra time/assistive device  Expression   Expression assist level: Expresses basic 90% of the time/requires cueing < 10% of the time.  Social Interaction Social Interaction assist level: Interacts appropriately 90% of the time - Needs monitoring or encouragement for participation or interaction.  Problem Solving Problem solving assist level: Solves basic 90% of the time/requires cueing < 10% of the time  Memory Memory assist level: Recognizes or recalls 90% of the time/requires cueing < 10% of the time    Pain Pain Assessment Pain Assessment: No/denies pain  Therapy/Group: Individual Therapy  Meredeth Ide  SLP - Student 12/17/2017, 2:46 PM

## 2017-12-18 ENCOUNTER — Inpatient Hospital Stay (HOSPITAL_COMMUNITY): Payer: PRIVATE HEALTH INSURANCE | Admitting: Physical Therapy

## 2017-12-18 ENCOUNTER — Inpatient Hospital Stay (HOSPITAL_COMMUNITY): Payer: PRIVATE HEALTH INSURANCE | Admitting: Occupational Therapy

## 2017-12-18 ENCOUNTER — Encounter (HOSPITAL_COMMUNITY): Payer: Self-pay | Admitting: Psychology

## 2017-12-18 ENCOUNTER — Inpatient Hospital Stay (HOSPITAL_COMMUNITY): Payer: PRIVATE HEALTH INSURANCE | Admitting: Speech Pathology

## 2017-12-18 NOTE — Progress Notes (Addendum)
[place] PHYSICAL MEDICINE & REHABILITATION     PROGRESS NOTE    Subjective/Complaints: Patient without new complaints.  Up early with therapy again today.  ROS: pt denies nausea, vomiting, diarrhea, cough, shortness of breath or chest pain   Objective: Vital Signs: Blood pressure (!) 127/92, pulse 96, temperature 98 F (36.7 C), temperature source Oral, resp. rate 16, weight 130 kg (286 lb 9.6 oz), SpO2 98 %. No results found. No results for input(s): WBC, HGB, HCT, PLT in the last 72 hours. No results for input(s): NA, K, CL, GLUCOSE, BUN, CREATININE, CALCIUM in the last 72 hours.  Invalid input(s): CO CBG (last 3)  No results for input(s): GLUCAP in the last 72 hours.  Wt Readings from Last 3 Encounters:  12/18/17 130 kg (286 lb 9.6 oz)  12/10/17 (!) 138.3 kg (304 lb 14.3 oz)  05/21/17 (!) 140.2 kg (309 lb)    Physical Exam:  Constitutional: He appears well-developed and well-nourished.  HENT: Normocephalic and atraumatic.  Eyes: EOMI. No discharge. Cardiovascular: RRR without murmur. No JVD      Respiratory:normal effort. GI: Bowel sounds are normal. He exhibits no distension.  Musculoskeletal: No peripheral edema  Neurological: He is alert.    Delayed processing.    Motor: RUE:  4+/5 proximal to distal with ataxia=== stable to improved RLE: 4+/5 proximal to distal  LUE: 5/5 proximal to distal LLE: 5/5 proximal to distal  Skin: Skin is warm and dry.   Vascular changes b/l LE  Psychiatric: Generally pleasant and appropriate   Assessment/Plan: 1.  Functional and mobility deficits secondary to left PCA infarction which require 3+ hours per day of interdisciplinary therapy in a comprehensive inpatient rehab setting. Physiatrist is providing close team supervision and 24 hour management of active medical problems listed below. Physiatrist and rehab team continue to assess barriers to discharge/monitor patient progress toward functional and medical  goals.  Function:  Bathing Bathing position Bathing activity did not occur: Refused Position: Shower  Bathing parts Body parts bathed by patient: Right arm, Left arm, Chest, Abdomen, Front perineal area, Right upper leg, Left upper leg, Right lower leg, Left lower leg, Buttocks Body parts bathed by helper: Back  Bathing assist Assist Level: Supervision or verbal cues      Upper Body Dressing/Undressing Upper body dressing   What is the patient wearing?: Pull over shirt/dress     Pull over shirt/dress - Perfomed by patient: Thread/unthread right sleeve, Thread/unthread left sleeve, Pull shirt over trunk, Put head through opening Pull over shirt/dress - Perfomed by helper: Put head through opening        Upper body assist Assist Level: Set up      Lower Body Dressing/Undressing Lower body dressing   What is the patient wearing?: Pants, Underwear, Non-skid slipper socks Underwear - Performed by patient: Thread/unthread right underwear leg, Thread/unthread left underwear leg, Pull underwear up/down   Pants- Performed by patient: Thread/unthread right pants leg, Thread/unthread left pants leg, Pull pants up/down Pants- Performed by helper: Pull pants up/down Non-skid slipper socks- Performed by patient: Don/doff right sock, Don/doff left sock Non-skid slipper socks- Performed by helper: Don/doff right sock, Don/doff left sock(wife put socks on patient, he stated he could not per note) Socks - Performed by patient: Don/doff right sock, Don/doff left sock   Shoes - Performed by patient: Don/doff right shoe, Don/doff left shoe            Lower body assist Assist for lower body dressing: Supervision or verbal  cues      Toileting Toileting   Toileting steps completed by patient: Adjust clothing prior to toileting, Performs perineal hygiene, Adjust clothing after toileting Toileting steps completed by helper: Adjust clothing prior to toileting, Performs perineal hygiene, Adjust  clothing after toileting Toileting Assistive Devices: Grab bar or rail  Toileting assist Assist level: Supervision or verbal cues   Transfers Chair/bed transfer Chair/bed transfer activity did not occur: Safety/medical concerns Chair/bed transfer method: Ambulatory Chair/bed transfer assist level: Supervision or verbal cues Chair/bed transfer assistive device: Armrests     Locomotion Ambulation     Max distance: 100' Assist level: Supervision or verbal cues   Wheelchair Wheelchair activity did not occur: Safety/medical concerns        Cognition Comprehension Comprehension assist level: Follows basic conversation/direction with extra time/assistive device  Expression Expression assist level: Expresses basic 90% of the time/requires cueing < 10% of the time.  Social Interaction Social Interaction assist level: Interacts appropriately 90% of the time - Needs monitoring or encouragement for participation or interaction.  Problem Solving Problem solving assist level: Solves basic 90% of the time/requires cueing < 10% of the time  Memory Memory assist level: Recognizes or recalls 90% of the time/requires cueing < 10% of the time   Medical Problem List and Plan: 1.  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  DVT Prophylaxis/Anticoagulation: Brilinta. Venous Doppler studies negative 3. Pain Management: Tylenol as needed 4. Mood: Provide emotional support 5. Neuropsych: This patient is not yet fully capable of making decisions on his own behalf. 6. Skin/Wound Care: Routine skin checks   -Nystatin powder to groin 7. Fluids/Electrolytes/Nutrition:    Encourage p.o. intake.  Patient does not appear malnourished 63.History of hypertension. Cozaar 50 mg daily   Continue to watch diastolic blood pressure, slightly elevated  today 9.Morbid obesity. BMI 44.7. 10.OSA. Patient on CPAP at home 11. Clostridium difficile. Contact precautions. Vancomycin 125 mg 4 times daily 12/06/2017 for 14 days    -Stool consistency is improving 12. Hypoalbuminemia   Supplement initiated on 2/5   LOS (Days) 8 A FACE TO FACE EVALUATION WAS PERFORMED  Meredith Staggers, MD 12/18/2017 8:36 AM

## 2017-12-18 NOTE — Discharge Summary (Deleted)
  The note originally documented on this encounter has been moved the the encounter in which it belongs.  

## 2017-12-18 NOTE — Progress Notes (Signed)
Occupational Therapy Discharge Summary  Patient Details  Name: Atreus Hasz MRN: [id number] Date of Birth: 10/22/1966  Today's Date: 12/18/2017 OT Individual Time: 1100-1159 OT Individual Time Calculation (min): 59 min    Patient has met 12 of 12 long term goals due to improved activity tolerance, improved balance, postural control, ability to compensate for deficits, functional use of  RIGHT upper extremity, improved attention, improved awareness and improved coordination.  Patient to discharge at overall Supervision level.  Patient's care partner is independent to provide the necessary physical and cognitive assistance at discharge.    Reasons goals not met: all goals met  Recommendation:  Patient will benefit from ongoing skilled OT services in outpatient setting to continue to advance functional skills in the area of BADL and iADL.  Equipment: shower chair  Reasons for discharge: treatment goals met  Patient/family agrees with progress made and goals achieved: Yes   OT Intervention: Upon entering the room, pt seated in wheelchair and his wife present in room for hands on family training. OT providing education to caregiver regarding safety and fall risks within the room and how to provide supervision and cues for safety. Caregiver demonstrated ability to provided theses cues while pt ambulated to bathroom for toileting and shower needs this session at overall supervision level with cues given from caregiver in regards to safety awareness. OT provided cues to encourage use of R UE for functional task. Pt donning clothing with sit <>stand from wheelchair level at overall supervision level. OT answering questions for pt and caregiver regarding discharge recommendations and OT follow up in outpatient. All questions and answered and pt remained seated in wheelchair as therapist exited the room.   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) Cognition Overall Cognitive Status: Impaired/Different from baseline Orientation Level: Oriented X4 Memory: Appears intact Behaviors: Impulsive Sensation Sensation Light Touch: Appears Intact Proprioception: Appears Intact Additional Comments: reports continued tingling in R hand that has remained the same since admission Coordination Gross Motor Movements are Fluid and Coordinated: Yes Fine Motor Movements are Fluid and Coordinated: No Heel Shin Test: appears equal BLE Motor  Motor Motor: Hemiplegia Motor - Skilled Clinical Observations: R hemiplegia, general weakness Motor - Discharge Observations: general weakness Mobility  Bed Mobility Bed Mobility: Rolling Right;Rolling Left;Supine to Sit;Sit to Supine Rolling Right: 6: Modified independent (Device/Increase time) Rolling Left: 6: Modified independent (Device/Increase time) Supine to Sit: 6: Modified independent (Device/Increase time) Sit to Supine: 6: Modified independent (Device/Increase time) Transfers Transfers: Sit to Stand;Stand to Sit Sit to Stand: 6: Modified independent (Device/Increase time) Stand to Sit: 6: Modified independent (Device/Increase time)   Balance Balance Balance Assessed: Yes Standardized Balance Assessment Standardized Balance Assessment: Berg Balance Test Berg Balance Test Sit to Stand: Able to stand without using hands and stabilize independently Standing Unsupported: Able to stand 2 minutes with supervision Sitting with Back Unsupported but Feet Supported on Floor or Stool: Able to sit safely and securely 2 minutes Stand to Sit: Sits safely with minimal use of hands Transfers: Able to transfer with verbal cueing and /or  supervision Standing Unsupported with Eyes Closed: Able to stand 10 seconds with supervision Standing Ubsupported with Feet Together: Able to place feet together independently and stand for 1 minute with supervision From Standing, Reach Forward with Outstretched Arm: Can reach  forward >5 cm safely (2") From Standing Position, Pick up Object from Floor: Unable to try/needs assist to keep balance From Standing Position, Turn to Look Behind Over each Shoulder: Looks behind one side only/other side shows less weight shift(slightly decreased weight shift to R) Turn 360 Degrees: Needs close supervision or verbal cueing(completes turns to both sides with supervision) Standing Unsupported, Alternately Place Feet on Step/Stool: Able to complete 4 steps without aid or supervision(completes all 8 steps with supervision) Standing Unsupported, One Foot in Front: Able to take small step independently and hold 30 seconds Standing on One Leg: Unable to try or needs assist to prevent fall Total Score: 33 Dynamic Sitting Balance Dynamic Sitting - Balance Support: Feet supported Dynamic Sitting - Level of Assistance: 6: Modified independent (Device/Increase time) Dynamic Standing Balance Dynamic Standing - Level of Assistance: 6: Modified independent (Device/Increase time) Extremity/Trunk Assessment RUE Assessment RUE Assessment: Exceptions to WFL(3+/5 can use in funtional way with increased times and cues) LUE Assessment LUE Assessment: Within Functional Limits   See Function Navigator for Current Functional Status.  Gypsy Decant 12/18/2017, 12:19 PM

## 2017-12-18 NOTE — Discharge Instructions (Signed)
Inpatient Rehab Discharge Instructions  William Lowery Discharge date and time: No discharge date for patient encounter.   Activities/Precautions/ Functional Status: Activity: activity as tolerated Diet: regular diet Wound Care: none needed Functional status:  ___ No restrictions     ___ Walk up steps independently ___ 24/7 supervision/assistance   ___ Walk up steps with assistance ___ Intermittent supervision/assistance  ___ Bathe/dress independently ___ Walk with walker     _x__ Bathe/dress with assistance ___ Walk Independently    ___ Shower independently ___ Walk with assistance    ___ Shower with assistance ___ No alcohol     ___ Return to work/school ________      COMMUNITY REFERRALS UPON DISCHARGE:    Outpatient: PT     OT    ST                  Agency:  [facility]     Phone: [phone]               Appointment Date/Time:  2/14 @ 10:15 am (speech) - please arrive @ 9:45am                                                                  2/19 @ 11:00 - 12:30 pm  (physical and occupational therapy)  Medical Equipment/Items Ordered:  Rolling walker and tub seat                                                      Agency/Supplier:  [facility] @ [phone]   GENERAL COMMUNITY RESOURCES FOR PATIENT/FAMILY:  Support Groups:  Stroke support group: 2nd Thursday of each month 3-4:00 pm                                                                         Dayroom of the Inpatient Rehab Unit ([facility])                                                                         Contact:  Shann Medal @ [phone]      Special Instructions: No driving  Follow-up annual CTA/MRA to monitor thoracic aortic aneurysm   My questions have been answered and I understand these instructions. I will adhere to these goals and the provided educational materials after my discharge from the hospital.  Patient/Caregiver Signature _______________________________  Date __________  Clinician Signature _______________________________________ Date __________  Please bring this form and your medication list with you to all your follow-up doctor's appointments.

## 2017-12-18 NOTE — Progress Notes (Signed)
Patient resting comfortably on home CPAP 

## 2017-12-18 NOTE — Discharge Summary (Signed)
Discharge summary job [id number]

## 2017-12-18 NOTE — Progress Notes (Signed)
Speech Language Pathology Session Note & Discharge Summary  Patient Details  Name: William Lowery MRN: [id number] Date of Birth: 1966-08-08  Today's Date: 12/18/2017 SLP Individual Time: 0730-0830 SLP Individual Time Calculation (min): 60 min   Skilled Therapeutic Interventions: Skilled treatment session focused on communication goals. Upon arrival, patient was supine in bed. SLP facilitated session by providing supervision verbal cues for pt to transfer from bed to wheelchair. SLP further facilitated session by providing supervision verbal cues for verbal expression with a verbal description task at the sentence level. SLP administered the Spontaneous Speech: Content section of the Western Aphasia Battery - Bedside. Pt scored 10/10, signifying an improvement from last administration on 12/11/2017 when pt scored 7/10. SLP educated pt in regards to his progress. Pt verbalized understanding and agreement. Pt left upright in wheelchair with all needs within reach. Continue with current plan of care.    Patient has met 5 of 5 long term goals.  Patient to discharge at overall Supervision level.   Reasons goals not met: N/A   Clinical Impression/Discharge Summary: Pt has made functional gains and has met 5 out of 5 LTG's this admission due to improved verbal expression and cognitive functioning. Pt is currently overall Supervision verbal cues for recall, anticipatory awareness, and complex problem solving. Pt also requires supervision verbal cues to self-monitor and correct errors during written expression and for word-finding at the conversation level. Pt education complete and pt will discharge home with 24-hour supervision from family. Pt would benefit from f/u outpatient SLP services to maximize cognitive-linguistic function and overall functional independence.    Care Partner:  Caregiver Able to Provide Assistance: Yes  Type of Caregiver Assistance: Physical;Cognitive  Recommendation:  Outpatient  SLP;24 hour supervision/assistance  Rationale for SLP Follow Up: Maximize functional communication;Maximize cognitive function and independence   Equipment: N/A   Reasons for discharge: Treatment goals met   Patient/Family Agrees with Progress Made and Goals Achieved: Yes   Function:  Cognition Comprehension Comprehension assist level: Follows basic conversation/direction with extra time/assistive device  Expression   Expression assist level: Expresses complex 90% of the time/cues < 10% of the time  Social Interaction Social Interaction assist level: Interacts appropriately with others with medication or extra time (anti-anxiety, antidepressant).  Problem Solving Problem solving assist level: Solves basic 90% of the time/requires cueing < 10% of the time  Memory Memory assist level: Recognizes or recalls 90% of the time/requires cueing < 10% of the time   Meredeth Ide  SLP - Student 12/18/2017, 3:30 PM

## 2017-12-18 NOTE — Consult Note (Signed)
Neuropsychological Consultation   Patient:   William Lowery   DOB:   1966-01-01  MR Number:  [id number]  Location:  [facility] [address] [id number] [place] [id number] Dept: [phone] QAS: [phone]           Date of Service:   12/18/2017  Start Time:   3 PM End Time:   4 PM  Provider/Observer:  Ilean Skill, Psy.D.       Clinical Neuropsychologist       Billing Code/Service: [phone] 4 Units  Chief Complaint:    William Lowery is a 52 year old right-handed male who has a history of hypertension, sleep apnea, and obesity.  Patient presented on 12/01/2017 with right-sided numbness as well as intermittent headache and right eye visual blurriness.  MRI showed multifocal acute ischemia within the posterior circulation territories predominantly within the left PCA distribution, including left occipital lobe and left thalamus.  The patient has had ongoing right-sided weakness, slowed information processing speed, and some visual changes with/disturbance.  Reason for Service:  William Lowery was referred for neuropsychological consultation due to ongoing cognitive difficulties and physical changes following a left hemisphere stroke.  Adjustment and coping issues with the physical and cognitive changes are ongoing.  Below is the HPI for the current admission  HPI: William Lowery a 52 y.o.right handedmalewith history of hypertension, sleep apnea, obesity. Per chart review patient lives with spouse. One level home two steps to entry. Independent prior to admission.He works full time and self-employed with [employer]. Presented 12/01/2017 with right-sided numbness as well as intermittent headache and right eye visual blurriness. Cranial CT scan reviewed, unremarkable for acute intracranial process. Per report, small remote right inferior cerebellar infarction. CT angiogram head and neck showed left  PCA occlusion beginning at its origin. Cerebral angiogram interventional radiology and underwent thrombectomy.Patient remained intubated through 12/03/2017. Follow-up MRI showed multifocal acute ischemia within the posterior circulation territories predominantly within the left PCA distribution, including left occipital lobe and left thalamus.Echocardiogram with ejection fraction of 55-60%. Lower extremity Dopplers negative. TEE/loop recorder placement 12/06/2017 showed ejection fraction of 70%.. No thrombus and negative for PFO. 0.51.0 cm calcified mass adjacent to the [place] that restricts movement question vegetation with CT angiogram of the chest completed for confirmation showing a 4.3 cm ascending thoracic aortic aneurysm and recommendations for annual imaging follow-up by CTA or MRA. Neurology consulted presently maintained on aspirin as well as Brilinta. Tolerating a regular consistency diet. Bouts of hypokalemia with supplement added. C. difficile specimen 12/06/2017 positive placed on vancomycin orally 125 mg 4 times a day 14 days as recommended by infectious disease. Latest blood culture 12/07/2017 no growth. Physical and occupational therapy evaluations completed with recommendations of physical medicine rehabilitation consult. Patient was admitted for a comprehensive rehabilitation program  Current Status:  The patient reports that it is difficult for him to identify some of the problems that he is having.  However, with direction from his wife and cueing he noted his right side weakness and some visual neglect symptoms are present.  Both he and his wife both report that there is been a slowed information processing speed and times when information is just not "clicking."  The patient reports and appears to be coping fairly well with the sudden change although there is a great deal of questions that the patient's wife has regarding what would be appropriate as far as the patient's plan to return to  work.  There clearly some significant residual medical and neurological issues ongoing.  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   Behavioral Observation: William Lowery  presents as a 52 y.o.-year-old-year-old Right Caucasian Male who appeared his stated age. his dress was Appropriate and he was Well Groomed and his manners were Appropriate to the situation.  his participation was indicative of Appropriate and Redirectable behaviors.  There were  physical disabilities noted.  he displayed an appropriate level of cooperation and motivation.     Interactions:    Active Appropriate and Redirectable  Attention:   abnormal and attention span appeared shorter than expected for age  Memory:   abnormal; remote memory intact, recent memory impaired  Visuo-spatial:  not examined  Speech (Volume):  normal  Speech:   There was some hesitancy to speech production and some indication of word finding issues.  Thought Process:  Coherent and Relevant  Though Content:  WNL; not suicidal  Orientation:   person, place, time/date and situation  Judgment:   Fair  Planning:   Fair  Affect:    Appropriate  Mood:    Anxious  Insight:   Fair  Intelligence:   high  Medical History:   Past Medical History:  Diagnosis Date  . Asthma    hx of  . Heart murmur    as an infant  . Hypertension   . Sleep apnea    wears CPAP   Psychiatric History:  No indications of pre-existing history of psychiatric issues or depression/anxiety symptoms.  Family Med/Psych History:  Family History  Problem Relation Age of Onset  . Colon cancer Neg Hx   . Esophageal cancer Neg Hx   . Rectal cancer Neg Hx   . Stomach cancer Neg Hx     Risk of Suicide/Violence: virtually non-existent the patient denies any suicidal or  homicidal ideation.  Impression/DX:  William Lowery is a 52 year old right-handed male who has a history of hypertension, sleep apnea, and obesity.  Patient presented on 12/01/2017 with right-sided numbness as well as intermittent headache and right eye visual blurriness.  MRI showed multifocal acute ischemia within the posterior circulation territories predominantly within the left PCA distribution, including left occipital lobe and left thalamus.  The patient has had ongoing right-sided weakness, slowed information processing speed, and some visual changes with/disturbance.  The patient reports that it is difficult for him to identify some of the problems that he is having.  However, with direction from his wife and cueing he noted his right side weakness and some visual neglect symptoms are present.  Both he and his wife both report that there is been a slowed information processing speed and times when information is just not "clicking."  The patient reports and appears to be coping fairly well with the sudden change although there is a great deal of questions that the patient's wife has regarding what would be appropriate as far as the patient's plan to return to work.  There clearly some significant residual medical and neurological issues ongoing.  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  Disposition/Plan:  I will follow the patient on an outpatient basis once he is discharged and is been able to acclimate the home.  At that point we will determine whether any neuropsychological assessment is needed for further treatment planning and therapeutic interventions.  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

## 2017-12-18 NOTE — Plan of Care (Signed)
  RH BOWEL ELIMINATION RH STG MANAGE BOWEL WITH ASSISTANCE Description STG Manage Bowel with mod I Assistance.  12/18/2017 0440 - Progressing by Evelena Asa, RN   RH BOWEL ELIMINATION RH STG MANAGE BOWEL W/MEDICATION W/ASSISTANCE Description STG Manage Bowel with Medication with min.Assistance.  12/18/2017 0440 - Progressing by Evelena Asa, RN   RH BLADDER ELIMINATION RH STG MANAGE BLADDER WITH ASSISTANCE Description STG Manage Bladder With mod I Assistance  12/18/2017 0440 - Progressing by Evelena Asa, RN   RH SKIN INTEGRITY RH STG SKIN FREE OF INFECTION/BREAKDOWN Description With min. assist  12/18/2017 0440 - Progressing by Evelena Asa, RN   RH SAFETY RH STG ADHERE TO SAFETY PRECAUTIONS W/ASSISTANCE/DEVICE Description STG Adhere to Safety Precautions With mod.Assistance/Device.  12/18/2017 0440 - Progressing by Evelena Asa, RN   RH PAIN MANAGEMENT RH STG PAIN MANAGED AT OR BELOW PT'S PAIN GOAL Description Less than 3  12/18/2017 0440 - Progressing by Evelena Asa, RN

## 2017-12-18 NOTE — Discharge Summary (Signed)
NAMEADONIS, William NO.:  [id number]  MEDICAL RECORD NO.:  [id number]  LOCATION:  3W21C                        FACILITY:  [facility]  PHYSICIAN:  Meredith Staggers, M.D.DATE OF BIRTH:  07-12-1966  DATE OF ADMISSION:  12/01/2017 DATE OF DISCHARGE:                              DISCHARGE SUMMARY   DISCHARGE DIAGNOSES: 1. 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 2. Deep vein thrombosis prophylaxis with Brilinta. 3. Hypertension. 4. Morbid obesity. 5. Obstructive sleep apnea. 6. Clostridium difficile, resolved. 7. Decreased nutritional storage.  HISTORY OF PRESENT ILLNESS:  This is a 52 year old right-handed male with history of hypertension, sleep apnea, obesity who lives with spouse, independent prior to admission.  He presented on December 01, 2017 with right-sided weakness, intermittent headache, and blurred vision.  Cranial CT scan unremarkable.  Small remote right inferior cerebellar infarction.  CT angiogram of the head and neck showed left PCA occlusion beginning at its origin.  Cerebral angiogram Interventional Radiology underwent thrombectomy.  The patient remained intubated through December 04, 2017.  Followup MRI showed multifocal acute ischemia within the posterior circulation territories predominantly within the left PCA distribution including left occipital lobe and left thalamus.  Echocardiogram with ejection fraction of 55% to 60%.  Lower extremity Dopplers negative.  TEE completed showing no thrombus, negative PFO, loop recorder was placed.  Noted calcified mass adjacent to the [place] that restricts movement, question vegetation with CT angiogram of the chest completed for confirmation showing a 4.3 cm ascending thoracic aortic aneurysm and recommendations for annual imaging followed by CTA or MRA.  Neurology consulted, maintained on aspirin as  well as Brilinta.  Bouts of hypokalemia, supplement added. Clostridium difficile specimen December 06, 2017 positive, placed on vancomycin x14 days.  The patient was admitted for comprehensive rehab program.  PAST MEDICAL HISTORY:  See discharge diagnoses.  SOCIAL HISTORY:  Lives with spouse, independent prior to admission.  FUNCTIONAL STATUS:  Upon admission to [facility] was minimal assist 20 feet rolling walker, minimal assist stand pivot transfers, min mod assist activities daily living.  PHYSICAL EXAMINATION:  VITAL SIGNS:  Blood pressure 130/80, pulse 77, temperature 98, and respirations 18. GENERAL:  Alert male, mood was flat, but appropriate.  Some word finding difficulties.  Easily frustrated. HEENT:  EOMs intact. NECK:  Supple, nontender, no JVD. CARDIAC:  Rate controlled. ABDOMEN:  Soft, nontender.  Good bowel sounds. LUNGS:  Clear to auscultation without wheeze. EXTREMITIES:  He had some modest vascular changes bilateral lower extremities.  REHABILITATION HOSPITAL COURSE:  The patient was admitted to Inpatient Rehab Services with therapies initiated on a 3-hour daily basis, consisting of physical therapy, occupational therapy, speech therapy, and rehabilitation nursing.  The following issues were addressed during the patient's rehabilitation stay.  Pertaining to Mr. Swor's left PCA infarction, he had undergone thrombectomy, placement of loop recorder, he would remain on aspirin as well as Brilinta, and follow up with both Neurology Services and Cardiology Services.  Blood pressures remained well-controlled on Cozaar.  Noted morbid obesity, BMI of 44.7, follow up per Dietary Services.  He did have a history of  obstructive sleep apnea. He had used CPAP at home.  He was completing a 14-day course of vancomycin for Clostridium difficile contact precautions.  The patient received weekly collaborative interdisciplinary team conferences to discuss estimated length of  stay, family teaching, any barriers to his discharge.  Sessions focused on caregiver education, positioning when ambulating with the patient, assisting him with stair negotiations.  He can navigate stairs, rolling walker, minimal assist.  Completed car transfers.  Supervision with cues.  Negotiating curbs using a rolling walker.  He could ambulate extended distances.  Gather belongings for activities of daily living and homemaking.  Ambulate to the day room with overall supervision.  Speech therapy followup sessions again focused on problem solving during a mild complex 4-step picture sequence task demonstrated verbal expression of picture descriptions with supervision.  Full family teaching was completed addressing no driving. He was discharged to home.  DISCHARGE MEDICATIONS:  Included: 1. Aspirin 81 mg p.o. daily. 2. Lipitor 10 mg p.o. daily. 3. Cozaar 50 mg p.o. daily. 4. Brilinta 90 mg p.o. b.i.d. 5. The patient completed a course of vancomycin 125 mg p.o. q.i.d. for     Clostridium difficile. 6. Tylenol as needed.  DIET:  His diet was regular.  FOLLOWUP:  He would follow up with Dr. Alger Simons at the Outpatient Rehab Service office as directed; Dr. Erlinda Hong, Neurology Service, call for appointment in 6 weeks; Dr. Lyman Bishop, Cardiology Services, call for appointment; Dr. Estanislado Pandy, Interventional Radiology; Dr. Deland Pretty, Medical Management.  SPECIAL INSTRUCTIONS:  No driving.  Advised annual CTA or MRA to address monitoring of ascending thoracic aortic aneurysm.     Lauraine Rinne, P.A.   ______________________________ Meredith Staggers, M.D.    DA/MEDQ  D:  12/18/2017  T:  12/18/2017  Job:  [id number]  cc:   Lyman Bishop, MD Meredith Staggers, M.D. Dr. Miguel Rota D. Shelia Media, M.D. Dr. Estanislado Pandy

## 2017-12-18 NOTE — Progress Notes (Signed)
Physical Therapy Discharge Summary  Patient Details  Name: William Lowery MRN: [id number] Date of Birth: Jul 24, 1966  Today's Date: 12/18/2017 PT Individual Time: 4166-0630 PT Individual Time Calculation (min): 85 min    Patient has met 8 of 8 long term goals due to improved activity tolerance, improved balance, improved postural control, increased strength, ability to compensate for deficits, functional use of  right upper extremity and right lower extremity, improved attention, improved awareness and improved coordination.  Patient to discharge at an ambulatory level Supervision with RW.   Patient's care partner is independent to provide the necessary physical and cognitive assistance at discharge.  Reasons goals not met: n/a  Recommendation:  Patient will benefit from ongoing skilled PT services in outpatient setting to continue to advance safe functional mobility, address ongoing impairments in decreased balance, decreased strength & endurance, progress gait with LRAD, impaired functional use of RUE, decreased awareness & attention to R, and minimize fall risk.  Equipment: double wheeled RW  Reasons for discharge: treatment goals met  Patient/family agrees with progress made and goals  achieved: Yes   Skilled PT Treatments: Pt received in w/c & agreeable to tx. Pt completes hand hygiene standing at sink with distant supervision for balance. Pt ambulates throughout unit with RW & supervision and completes all grad day activities with supervision<>min assist overall; please see below for details. Pt completes bed mobility on mat table to simulate bed height at home. Pt requires cuing throughout session to decrease speed at which he moves 2/2 impulsivity and to take time to think through and verbalize task PRN to increase overall safety. Pt completed Berg Balance Test & scored [phone]; educated pt on interpretation of score, current fall risk, and recommendations to increase overall safety.  Patient demonstrates increased fall risk as noted by score of 33/56 on Berg Balance Scale.  (<36= high risk for falls, close to 100%; 37-45 significant >80%; 46-51 moderate >50%; 52-55 lower >25%). In BI gym pt utilized dynavision with RW with task focusing on forced use & NMR - pt with impaired fine motor coordination with extremity. Pt then utilized board with LUE with task focusing on R attention and pt with reduced reaction time to R side (RUQ slower than RLQ). At end of session pt returned to room & left in w/c with all needs within reach.    PT Discharge Precautions/Restrictions Precautions Precautions: Fall Restrictions Weight Bearing Restrictions: No  Pain Denies c/o pain.  Vision/Perception  Wears glasses for reading at baseline. Reports impaired vision in R visual field of R eye following event (reports this has remained this same with no worsening or improvement).  Cognition Overall Cognitive Status: Impaired/Different from baseline Orientation Level: Oriented X4 Memory: Appears intact Behaviors: Impulsive(with functional mobility, requires cuing for safety with movements with RW)   Sensation Sensation Light Touch: Appears Intact(BLE) Proprioception: Appears Intact(BLE) Additional Comments: reports continued tingling in R hand that has remained the same since admission Coordination Gross Motor Movements are Fluid and Coordinated: Yes Heel Shin Test: appears equal BLE  Motor  Motor Motor: Hemiplegia(R side) Motor - Discharge Observations: general weakness   Mobility Bed Mobility Bed Mobility: Rolling Right;Rolling Left;Supine to Sit;Sit to Supine(on mat table to simulate bed height) Rolling Right: 6: Modified independent (Device/Increase time) Rolling Left: 6: Modified independent (Device/Increase time) Supine to Sit: 6: Modified independent (Device/Increase time) Sit to Supine: 6: Modified independent (Device/Increase time) Transfers Transfers: Yes Sit to  Stand: 5: Supervision;With armrests(with RW, cuing to push up from chair/stable surface)  Stand to Sit: 5: Supervision;With armrests  Locomotion  Ambulation Ambulation: Yes Ambulation/Gait Assistance: 5: Supervision Ambulation Distance (Feet): 150 Feet Assistive device: Rolling walker(double wheeled) Gait Gait Pattern: Decreased step length - right;Decreased stride length Stairs / Additional Locomotion Stairs: Yes Stairs Assistance: 4: Min assist;5: Supervision(supervision when using B rails, min assist when negotiating stairs backwards with RW) Stairs Assistance Details: Verbal cues for precautions/safety;Verbal cues for sequencing;Verbal cues for technique Stair Management Technique: (negotiates 4 steps backwards with RW and min assist to stabilize RW, and negotiates 24 steps (6" + 3") forwards with B rails and supervision) Number of Stairs: 24  Height of Stairs: (6" + 3") Ramp: 5: Supervision(ambulatory with RW) Curb: 4: Min assist(with RW) Wheelchair Mobility Wheelchair Mobility: No  Balance Balance Balance Assessed: Yes Standardized Balance Assessment Standardized Balance Assessment: Furniture conservator/restorer Berg Balance Test Sit to Stand: Able to stand without using hands and stabilize independently Standing Unsupported: Able to stand 2 minutes with supervision Sitting with Back Unsupported but Feet Supported on Floor or Stool: Able to sit safely and securely 2 minutes Stand to Sit: Sits safely with minimal use of hands Transfers: Able to transfer with verbal cueing and /or supervision Standing Unsupported with Eyes Closed: Able to stand 10 seconds with supervision Standing Ubsupported with Feet Together: Able to place feet together independently and stand for 1 minute with supervision From Standing, Reach Forward with Outstretched Arm: Can reach forward >5 cm safely (2") From Standing Position, Pick up Object from Floor: Unable to try/needs assist to keep balance From Standing  Position, Turn to Look Behind Over each Shoulder: Looks behind one side only/other side shows less weight shift(slightly decreased weight shift to R) Turn 360 Degrees: Needs close supervision or verbal cueing(completes turns to both sides with supervision) Standing Unsupported, Alternately Place Feet on Step/Stool: Able to complete 4 steps without aid or supervision(completes all 8 steps with supervision) Standing Unsupported, One Foot in Front: Able to take small step independently and hold 30 seconds Standing on One Leg: Unable to try or needs assist to prevent fall Total Score: 33   Extremity Assessment  RLE Assessment RLE Assessment: Within Functional Limits LLE Assessment LLE Assessment: Within Functional Limits   See Function Navigator for Current Functional Status.  [name] 12/18/2017, 12:54 PM

## 2017-12-19 MED ORDER — LOSARTAN POTASSIUM 50 MG PO TABS: 50.0000 mg | ORAL_TABLET | Freq: Every day | ORAL | 0 refills | Status: DC

## 2017-12-19 MED ORDER — BACID PO TABS: 2.0000 | ORAL_TABLET | Freq: Three times a day (TID) | ORAL | 0 refills | Status: DC

## 2017-12-19 MED ORDER — VANCOMYCIN HCL 125 MG PO CAPS: 125.0000 mg | ORAL_CAPSULE | Freq: Four times a day (QID) | ORAL | 0 refills | Status: DC

## 2017-12-19 MED ORDER — ASPIRIN 81 MG PO TBEC: 81.0000 mg | DELAYED_RELEASE_TABLET | Freq: Every day | ORAL | Status: DC

## 2017-12-19 MED ORDER — ATORVASTATIN CALCIUM 10 MG PO TABS: 10.0000 mg | ORAL_TABLET | Freq: Every day | ORAL | 1 refills | Status: AC

## 2017-12-19 MED ORDER — TICAGRELOR 90 MG PO TABS: 90.0000 mg | ORAL_TABLET | Freq: Two times a day (BID) | ORAL | 1 refills | Status: DC

## 2017-12-19 NOTE — Progress Notes (Signed)
[place] PHYSICAL MEDICINE & REHABILITATION     PROGRESS NOTE    Subjective/Complaints: Patient without new complaints.  Tolerating CPAP.  Anxious to get home.  ROS: pt denies nausea, vomiting, diarrhea, cough, shortness of breath or chest pain   Objective: Vital Signs: Blood pressure 114/70, pulse 63, temperature 98.9 F (37.2 C), temperature source Oral, resp. rate 16, weight 130.5 kg (287 lb 11.2 oz), SpO2 96 %. No results found. No results for input(s): WBC, HGB, HCT, PLT in the last 72 hours. No results for input(s): NA, K, CL, GLUCOSE, BUN, CREATININE, CALCIUM in the last 72 hours.  Invalid input(s): CO CBG (last 3)  No results for input(s): GLUCAP in the last 72 hours.  Wt Readings from Last 3 Encounters:  12/19/17 130.5 kg (287 lb 11.2 oz)  12/10/17 (!) 138.3 kg (304 lb 14.3 oz)  05/21/17 (!) 140.2 kg (309 lb)    Physical Exam:  Constitutional: He appears well-developed and well-nourished.  HENT: Normocephalic and atraumatic.  Eyes: EOMI. No discharge. Cardiovascular: RRR without murmur. No JVD      Respiratory: Normal effort GI: Bowel sounds are normal. He exhibits no distension.  Musculoskeletal: No peripheral edema  Neurological: He is alert.    Delayed processing.    Motor: RUE:  4+/5 proximal to distal with ataxia--improved RLE: 4+/5 proximal to distal  LUE: 5/5 proximal to distal LLE: 5/5 proximal to distal  Skin: Skin is warm and dry.   Vascular changes b/l LE  Psychiatric: Generally pleasant and appropriate   Assessment/Plan: 1.  Functional and mobility deficits secondary to left PCA infarction which require 3+ hours per day of interdisciplinary therapy in a comprehensive inpatient rehab setting. Physiatrist is providing close team supervision and 24 hour management of active medical problems listed below. Physiatrist and rehab team continue to assess barriers to discharge/monitor patient progress toward functional and medical  goals.  Function:  Bathing Bathing position Bathing activity did not occur: Refused Position: Shower  Bathing parts Body parts bathed by patient: Right arm, Left arm, Chest, Abdomen, Front perineal area, Right upper leg, Left upper leg, Right lower leg, Left lower leg, Buttocks Body parts bathed by helper: Back  Bathing assist Assist Level: Supervision or verbal cues      Upper Body Dressing/Undressing Upper body dressing   What is the patient wearing?: Pull over shirt/dress     Pull over shirt/dress - Perfomed by patient: Thread/unthread right sleeve, Thread/unthread left sleeve, Pull shirt over trunk, Put head through opening Pull over shirt/dress - Perfomed by helper: Put head through opening        Upper body assist Assist Level: Supervision or verbal cues      Lower Body Dressing/Undressing Lower body dressing   What is the patient wearing?: Pants, Underwear, Non-skid slipper socks Underwear - Performed by patient: Thread/unthread right underwear leg, Thread/unthread left underwear leg, Pull underwear up/down   Pants- Performed by patient: Thread/unthread right pants leg, Thread/unthread left pants leg, Pull pants up/down Pants- Performed by helper: Pull pants up/down Non-skid slipper socks- Performed by patient: Don/doff right sock, Don/doff left sock Non-skid slipper socks- Performed by helper: Don/doff right sock, Don/doff left sock(wife put socks on patient, he stated he could not per note) Socks - Performed by patient: Don/doff right sock, Don/doff left sock   Shoes - Performed by patient: Don/doff right shoe, Don/doff left shoe            Lower body assist Assist for lower body dressing: Supervision or verbal  cues      Toileting Toileting   Toileting steps completed by patient: Adjust clothing prior to toileting, Performs perineal hygiene, Adjust clothing after toileting Toileting steps completed by helper: Adjust clothing prior to toileting, Performs  perineal hygiene, Adjust clothing after toileting Toileting Assistive Devices: Grab bar or rail  Toileting assist Assist level: Supervision or verbal cues   Transfers Chair/bed transfer Chair/bed transfer activity did not occur: Safety/medical concerns Chair/bed transfer method: Ambulatory Chair/bed transfer assist level: Supervision or verbal cues Chair/bed transfer assistive device: Medical sales representative     Max distance: 150 ft Assist level: Supervision or verbal cues   Wheelchair Wheelchair activity did not occur: N/A        Cognition Comprehension Comprehension assist level: Follows basic conversation/direction with extra time/assistive device  Expression Expression assist level: Expresses complex 90% of the time/cues < 10% of the time  Social Interaction Social Interaction assist level: Interacts appropriately with others with medication or extra time (anti-anxiety, antidepressant).  Problem Solving Problem solving assist level: Solves basic 90% of the time/requires cueing < 10% of the time  Memory Memory assist level: Recognizes or recalls 90% of the time/requires cueing < 10% of the time   Medical Problem List and Plan: 1.  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 Follow up arranged   -Patient to see Rehab MD in the office for transitional care encounter in 1-2 weeks.  2.  DVT Prophylaxis/Anticoagulation: Brilinta. Venous Doppler studies negative 3. Pain Management: Tylenol as needed 4. Mood: Provide emotional support 5. Neuropsych: This patient is not yet fully capable of making decisions on his own behalf. 6. Skin/Wound Care: Routine skin checks   -Nystatin powder to groin 7. Fluids/Electrolytes/Nutrition:    Encourage p.o. intake.  Patient does not appear malnourished 50.History of hypertension. Cozaar 50 mg daily   Continue to  watch diastolic blood pressure, slightly elevated today 9.Morbid obesity. BMI 44.7. 10.OSA. Patient on CPAP at home 11. Clostridium difficile. Contact precautions. Vancomycin 125 mg 4 times daily 12/06/2017 for 14 days    -Stool consistency is improving 12. Hypoalbuminemia   Supplement initiated on 2/5   LOS (Days) 9 A FACE TO FACE EVALUATION WAS PERFORMED  Meredith Staggers, MD 12/19/2017 8:45 AM

## 2017-12-19 NOTE — Progress Notes (Signed)
Social Work  Discharge Note  The overall goal for the admission was met for:   Discharge location: Yes - home with wife who can provide 24/7 supervision  Length of Stay: Yes - 9 days  Discharge activity level: Yes - supervision  Home/community participation: Yes  Services provided included: MD, RD, PT, OT, SLP, RN, TR, Pharmacy, Neuropsych and SW  Financial Services: None - self pay.  [facility] worker, community following.  Follow-up services arranged: Outpatient: PT, OT, ST via [facility], DME: bari rolling walker, tub seat via [facility] and Patient/Family has no preference for HH/DME agencies  Comments (or additional information):  Patient/Family verbalized understanding of follow-up arrangements: Yes  Individual responsible for coordination of the follow-up plan: pt  Confirmed correct DME delivered: Alishba [place] 12/19/2017    Amaiya Scruton

## 2017-12-19 NOTE — Patient Care Conference (Signed)
Inpatient RehabilitationTeam Conference and Plan of Care Update Date: 12/18/2017   Time: 2:35 PM    Patient Name: William Lowery      Medical Record Number: [id number]  Date of Birth: [birth year] Sex: Male         Room/Bed: 4W07C/4W07C-01 Payor Info: Payor: [employer]CIAL / Plan: GENERIC COMMERCIAL / Product Type: *No Product type* /    Admitting Diagnosis: L CVA  Admit Date/Time:  12/10/2017  5:42 PM Admission Comments: No comment available   Primary Diagnosis:  <principal problem not specified> Principal Problem: <principal problem not specified>  Patient Active Problem List   Diagnosis Date Noted  . Hypoalbuminemia due to protein-calorie malnutrition ([place])   . Essential hypertension   . Cerebrovascular accident (CVA) due to occlusion of left posterior communicating artery ([place]) 12/10/2017  . Cerebrovascular accident (CVA) due to thrombosis of left posterior cerebral artery ([place])   . Benign essential HTN   . Morbid obesity ([place])   . OSA (obstructive sleep apnea)   . Enteritis due to Clostridium difficile   . Acute CVA (cerebrovascular accident) ([address]) 12/01/2017  . Acute arterial ischemic stroke, multifocal, posterior circulation ([place]) 12/01/2017    Expected Discharge Date: Expected Discharge Date: 12/19/17  Team Members Present: Physician leading conference: Dr. Alger Simons Social Worker Present: Lennart Pall, LCSW Nurse Present: Dwaine Gale, RN PT Present: Lavone Nian, PT OT Present: Benay Pillow, OT SLP Present: Weston Anna, SLP PPS Coordinator present : Ileana Ladd, PT     Current Status/Progress Goal Weekly Team Focus  Medical   Diarrhea stable.  Blood pressure has been borderline to stable  Stabilize patient medically  The above, see prior   Bowel/Bladder   continent of B/B, last BM 2/11  Maintain continence  Assist with toileting as needed   Swallow/Nutrition/ Hydration             ADL's   Supervision bathing at shower level, supervision UB/LB  dressing, supervision-Min A bathroom transfers at ambulatory level with RW  mod  I - grooming and sitting balance, Supervision overall   Rt NMR, ADL retraining, balance, endurance, functional transfers, cognitive remediation, pt/family education    Mobility   supervision overall with RW  supervision overall, except min assist stair negotiation  pt/family education, d/c planning, stair negotiation, gait, transfers, NMR   Communication   Supervision  Supervision  Goals Met, D/C tomorrow    Safety/Cognition/ Behavioral Observations  Supevision  Supervision  Goals Met, D/C tomorrow    Pain   Denies pain  Pain < 2  Assess qshift and prn   Skin   MASD groin and abdominal fold  Prevent skin breakdown and infection  Continue with nystatin powder and keep the areas dry    Rehab Goals Patient on target to meet rehab goals: Yes *See Care Plan and progress notes for long and short-term goals.     Barriers to Discharge  Current Status/Progress Possible Resolutions Date Resolved   Physician    Medical stability        n/a      Nursing                  PT                    OT                  SLP                SW  Discharge Planning/Teaching Needs:  Home with wife who can provide 24/7 supervision/ assistance  Teaching completed with wife   Team Discussion:  Medically stable and reaching supervision goals.  Ready for d/c tomorrow.  Revisions to Treatment Plan:  None    Continued Need for Acute Rehabilitation Level of Care: The patient requires daily medical management by a physician with specialized training in physical medicine and rehabilitation for the following conditions: Daily direction of a multidisciplinary physical rehabilitation program to ensure safe treatment while eliciting the highest outcome that is of practical value to the patient.: Yes Daily medical management of patient stability for increased activity during participation in an intensive  rehabilitation regime.: Yes Daily analysis of laboratory values and/or radiology reports with any subsequent need for medication adjustment of medical intervention for : Neurological problems  Yiannis Tulloch 12/19/2017, 10:06 AM

## 2017-12-20 ENCOUNTER — Ambulatory Visit: Payer: Self-pay | Admitting: Speech Pathology

## 2017-12-21 ENCOUNTER — Inpatient Hospital Stay (HOSPITAL_COMMUNITY): Payer: Self-pay | Admitting: Speech Pathology

## 2017-12-21 ENCOUNTER — Telehealth: Payer: Self-pay | Admitting: Registered Nurse

## 2017-12-21 NOTE — Telephone Encounter (Signed)
Placed a call for Transitional care Call: unable to reach William Lowery. Voice mail full. Placed a call to William Lowery, no answer. Left message to return the call.

## 2017-12-24 ENCOUNTER — Telehealth: Payer: Self-pay | Admitting: Registered Nurse

## 2017-12-24 ENCOUNTER — Telehealth (HOSPITAL_COMMUNITY): Payer: Self-pay

## 2017-12-24 NOTE — Telephone Encounter (Signed)
2nd attempt/ Call placed to spouse: William Lowery

## 2017-12-24 NOTE — Telephone Encounter (Signed)
Called to schedule f/u, no answer, left vm. AW 

## 2017-12-25 ENCOUNTER — Ambulatory Visit: Payer: Self-pay | Admitting: Physical Therapy

## 2017-12-25 ENCOUNTER — Encounter: Payer: Self-pay | Admitting: Physical Therapy

## 2017-12-25 ENCOUNTER — Ambulatory Visit: Payer: Self-pay | Attending: Internal Medicine | Admitting: Occupational Therapy

## 2017-12-25 ENCOUNTER — Telehealth: Payer: Self-pay | Admitting: Registered Nurse

## 2017-12-25 ENCOUNTER — Other Ambulatory Visit: Payer: Self-pay

## 2017-12-25 ENCOUNTER — Encounter: Payer: Self-pay | Admitting: Occupational Therapy

## 2017-12-25 DIAGNOSIS — M6281 Muscle weakness (generalized): Secondary | ICD-10-CM

## 2017-12-25 DIAGNOSIS — R2681 Unsteadiness on feet: Secondary | ICD-10-CM

## 2017-12-25 DIAGNOSIS — R2689 Other abnormalities of gait and mobility: Secondary | ICD-10-CM | POA: Insufficient documentation

## 2017-12-25 DIAGNOSIS — R41841 Cognitive communication deficit: Secondary | ICD-10-CM | POA: Insufficient documentation

## 2017-12-25 DIAGNOSIS — R278 Other lack of coordination: Secondary | ICD-10-CM

## 2017-12-25 DIAGNOSIS — I69351 Hemiplegia and hemiparesis following cerebral infarction affecting right dominant side: Secondary | ICD-10-CM

## 2017-12-25 DIAGNOSIS — R270 Ataxia, unspecified: Secondary | ICD-10-CM | POA: Insufficient documentation

## 2017-12-25 DIAGNOSIS — I69318 Other symptoms and signs involving cognitive functions following cerebral infarction: Secondary | ICD-10-CM | POA: Insufficient documentation

## 2017-12-25 DIAGNOSIS — R4701 Aphasia: Secondary | ICD-10-CM | POA: Insufficient documentation

## 2017-12-25 DIAGNOSIS — R41842 Visuospatial deficit: Secondary | ICD-10-CM | POA: Insufficient documentation

## 2017-12-25 DIAGNOSIS — G8191 Hemiplegia, unspecified affecting right dominant side: Secondary | ICD-10-CM | POA: Insufficient documentation

## 2017-12-25 NOTE — Telephone Encounter (Signed)
This provider has tried calling   William Lowery may times, no return call. Left message to return the call for appointment. This makes the 5th call today, with no response.

## 2017-12-25 NOTE — Patient Instructions (Signed)
Heel Raise: Bilateral (Standing)    Rise on balls of feet. Repeat _10___ times per set. Do __1__ sets per session. Do _2__ sessions per day.  Try to get a little more weight on RIGHT leg than on left leg!!  http://orth.exer.us/38   Copyright  VHI. All rights reserved.  Heel Raise: Unilateral (Standing)    Balance on left foot, then rise on ball of foot. Repeat _10___ times per set. Do _1___ sets per session. Do _2___ sessions per day.  http://orth.exer.us/40   Copyright  VHI. All rights reserved.

## 2017-12-25 NOTE — Therapy (Signed)
[facility] [address] [place], [place], [address] Phone: [phone]   Fax:  [phone]  Occupational Therapy Treatment  Patient Details  Name: William Lowery MRN: [id number] Date of Birth: March 02, 1966 Referring Provider: Dr. Alger Simons   Encounter Date: 12/25/2017  OT End of Session - 12/25/17 1302    Visit Number  1    Number of Visits  16    Date for OT Re-Evaluation  02/19/18    Authorization Type  self pay    OT Start Time  1102    OT Stop Time  1147    OT Time Calculation (min)  45 min    Activity Tolerance  Patient tolerated treatment well pt did become somewhat emotional       Past Medical History:  Diagnosis Date  . Asthma    hx of  . Heart murmur    as an infant  . Hypertension   . Sleep apnea    wears CPAP    Past Surgical History:  Procedure Laterality Date  . bellybutton procedure     as a newborn  . IR ANGIO INTRA EXTRACRAN SEL COM CAROTID INNOMINATE BILAT MOD SED  12/01/2017  . IR ANGIO VERTEBRAL SEL VERTEBRAL UNI R MOD SED  12/01/2017  . IR CT HEAD LTD  12/01/2017  . IR INTRA CRAN STENT  12/01/2017  . IR PERCUTANEOUS ART THROMBECTOMY/INFUSION INTRACRANIAL INC DIAG ANGIO  12/01/2017  . RADIOLOGY WITH ANESTHESIA N/A 12/01/2017   Procedure: RADIOLOGY WITH ANESTHESIA CODE STROKE;  Surgeon: Radiologist, Medication, MD;  Location: [facility];  Service: Radiology;  Laterality: N/A;  . TEE WITHOUT CARDIOVERSION N/A 12/06/2017   Procedure: TRANSESOPHAGEAL ECHOCARDIOGRAM (TEE);  Surgeon: Pixie Casino, MD;  Location: [facility] ENDOSCOPY;  Service: Cardiovascular;  Laterality: N/A;    There were no vitals filed for this visit.  Subjective Assessment - 12/25/17 1105    Subjective   I don't remember what I worked on with [facility] in rehab    Patient is accompained by:  Family member wife [name]    Pertinent History  12/01/2017 L PCA CVA     Patient Stated Goals  I hope to get as much of my right hand working again  as I can    Currently in Pain?  No/denies         [facility] OT Assessment - 12/25/17 0001      Assessment   Medical Diagnosis  L PCA territiory CVA (multi infarct)    Referring Provider  Dr. Naaman Plummer    Onset Date/Surgical Date  12/01/17    Hand Dominance  Right    Prior Therapy  inpt rehab PT, OT, ST       Precautions   Precautions  Fall PT eval today      Restrictions   Weight Bearing Restrictions  No      Balance Screen   Has the patient fallen in the past 6 months  No      Home  Environment   Family/patient expects to be discharged to:  Private residence    Living Arrangements  Spouse/significant other    Available Help at Discharge  Available 24 hours/day    Type of [place]  One level    Bathroom Shower/Tub  Medical illustrator  Standard    Additional Comments  Pt has shower seat but today took it without using shower seat.  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[employer];  drives occassionally , computer work    Leisure  trade and buy [employer]      ADL   Eating/Feeding  Minimal assistance for cutting; using L hand more    Grooming  Supervision/safety uses L hand more    Upper Body Bathing  Minimal assistance assistane to wash back, close supervision for balance    Lower Body Bathing  Supervision/safety    Upper Body Dressing  Supervision/safety    Lower Body Dressing  Minimal assistance cues, tying shoes, socks    Toilet Transfer  Modified independent    Toileting - Clothing Manipulation  Modified independent    Toileting -  Hygiene  Minimal assistance for bowel movement    Tub/Shower Transfer  Supervision/safety    ADL comments  Wife currently providing max cues however is not able to say why - "I think he could maybe do it if I wasn't telling him I am not sure"  Encourgaed wife to gather clothes and just allow pt to dress  without cues and observe what happens. Pt and wife in agreemen to try this tomorrow.       IADL   Shopping  Completely unable to shop    Light Housekeeping  -- pt made bed before not doing this now    Meal Prep  -- able to get drink - did not cook at all before    [employer] on family or friends for transportation    Medication Management  Is not capable of dispensing or managing own medication wife working with pt to be more independent    Financial Management  Requires assistance      Mobility   Mobility Status  Needs assist    Mobility Status Comments  in the community      Written Expression   Dominant Hand  Right    Handwriting  Not legible for name      Vision - History   Baseline Vision  Wears glasses only for reading    Additional Comments  Pt with R mid and superior partial field cut      Vision Assessment   Eye Alignment  Within Functional Limits    Ocular Range of Motion  Within Functional Limits    Tracking/Visual Pursuits  Able to track stimulus in all quads without difficulty    Saccades  Within functional limits    Convergence  Within functional limits    Visual Fields  Right visual field deficit      Activity Tolerance   Activity Tolerance  Tolerate 30+ min activity without fatigue    Activity Tolerance Comments  Pt with L drift in functional ambulation      Cognition   Overall Cognitive Status  Impaired/Different from baseline    Memory  Impaired    Awareness  Impaired    Problem Solving  Impaired    Executive Function  Reasoning;Sequencing;Organizing;Decision Making;Self Monitoring;Self Correcting    Reasoning  Impaired    Sequencing  Impaired    Organizing  Impaired    Decision Making  Impaired    Self Monitoring  Impaired    Self Correcting  Impaired      Sensation   Light Touch  Appears Intact    Hot/Cold  Appears Intact    Proprioception  Impaired by gross assessment  for smaller movements       Coordination   Gross Motor  Movements are Fluid and Coordinated  No mild incooridnation with gross movement    Fine Motor Movements are Fluid and Coordinated  No signficant incoordination in RUE    9 Hole Peg Test  Right;Left    Right 9 Hole Peg Test  -- 1 peg in 44.43     Left 9 Hole Peg Test  27    Box and Blocks  12      Perception   Perception  -- will monitor via functional activity      Tone   Assessment Location  Right Upper Extremity      ROM / Strength   AROM / PROM / Strength  AROM;Strength      AROM   Overall AROM   Within functional limits for tasks performed    Overall AROM Comments  BUE"s      Strength   Overall Strength  Deficits    Overall Strength Comments  4+/5 proximal strength RUE, LUE 5/5      Hand Function   Right Hand Gross Grasp  Impaired    Right Hand Grip (lbs)  20    Left Hand Gross Grasp  Functional    Left Hand Grip (lbs)  65      RUE Tone   RUE Tone  Within Functional Limits                         OT Short Term Goals - 12/25/17 1243      OT SHORT TERM GOAL #1   Title  Pt and wife will be mod I with HEP for coordination, RUE strength and cognition - due 01/22/2018    Status  New      OT SHORT TERM GOAL #2   Title  Pt will demonstrate improved gross control of RUE as evidenced by increasing by at least 5 blocks on Box and Blocks to assist with basic ADL's    Status  New      OT SHORT TERM GOAL #3   Title  Pt will be mod I with bathing at shower level using AE for back prn    Status  New      OT SHORT TERM GOAL #4   Title  Pt will be mod I with dressing    Status  New      OT SHORT TERM GOAL #5   Title  Pt will be mod I with toilet hygiene    Status  New      OT SHORT TERM GOAL #6   Title  Pt will demonstrate inmproved grip strength by at least 5 pounds to assist with basic ADL and IADL tasks (baseline =20 pounds)    Status  New      OT SHORT TERM GOAL #7   Title  Pt will use RUE as stabilizer during ADL and IADL tasks    Status  New         OT Long Term Goals - 12/25/17 1249      OT LONG TERM GOAL #1   Title  Pt will be mod I with upgraded HEP for cognition and RUE funcitonal use - 02/19/2018    Status  New      OT LONG TERM GOAL #2   Title  Pt will demonstrate improved gross motor control of RUE as evidenced by improving by at least 8  blocks on BOx and blocks to assist with IADL's    Status  New      OT LONG TERM GOAL #3   Title  Pt will demonstrate improved grip strength by at least 8 pounds to assist with IADL tasks ( baseline= 20 pounds)    Status  New      OT LONG TERM GOAL #4   Title  Pt will demonstrate ability for basic problem solving and organization with familiar functional task.    Status  New      OT LONG TERM GOAL #5   Title  Pt will demonstrate ability to write name with 100% legibility    Status  New      OT LONG TERM GOAL #6   Title  Pt will demonstrate ability for environmental scanning during physical task with 90% accuracy.    Status  New      OT LONG TERM GOAL #7   Title  Pt will demonstrate ability to complete functional task on outdoor uneven surfaces involving basic problem solving and organization.     Status  New            Plan - 12/25/17 1255    Clinical Impression Statement  Pt is a 52 year old male s/p L PCA territory CVA (multi infarct).  Pt presents with the following deficits that impact his independence:  RUE ataxia and incoordination, RUE weakness, decreased functional use of RUE, decreased balance, impaired activity tolerance, impaired cognition, R visual field cut, impaired sensation, lability.  Pt will benefit from skilled OT to address these deficits and maximize independence.      Occupational Profile and client history currently impacting functional performance  PMH: morbid obesity, HTN, sleep apnea, small remote R cerebellar CVA, asthma. Pt with loop recorder!!!    Occupational performance deficits (Please refer to evaluation for details):   ADL's;IADL's;Work;Leisure;Social Participation    Rehab Potential  Good    OT Frequency  2x / week    OT Duration  8 weeks    OT Treatment/Interventions  Self-care/ADL training;DME and/or AE instruction;Neuromuscular education;Therapeutic exercise;Therapist, nutritional;Therapeutic activities;Cognitive remediation/compensation;Visual/perceptual remediation/compensation;Patient/family education;Balance training    Plan  review goals and POC, initiate HEP for coordination as able, MOCA if not done by ST, futher assess visual scanning, NMR for RUE funtional use including weight bearing and closed chain activities, balance, incorporate cognition     Clinical Decision Making  Multiple treatment options, significant modification of task necessary    Consulted and Agree with Plan of Care  Patient;Family member/caregiver    Family Member Consulted  wife [name]       Patient will benefit from skilled therapeutic intervention in order to improve the following deficits and impairments:  Abnormal gait, Decreased activity tolerance, Decreased balance, Decreased coordination, Decreased cognition, Decreased knowledge of use of DME, Decreased mobility, Decreased safety awareness, Difficulty walking, Decreased strength, Impaired UE functional use, Impaired sensation, Impaired vision/preception, Obesity  Visit Diagnosis: Ataxia - Plan: Ot plan of care cert/re-cert  Muscle weakness (generalized) - Plan: Ot plan of care cert/re-cert  Hemiplegia affecting right dominant side, unspecified etiology, unspecified hemiplegia type ([place]) - Plan: Ot plan of care cert/re-cert  Unsteadiness on feet - Plan: Ot plan of care cert/re-cert  Other symptoms and signs involving cognitive functions following cerebral infarction - Plan: Ot plan of care cert/re-cert  Visuospatial deficit - Plan: Ot plan of care cert/re-cert  Other lack of coordination - Plan: Ot plan of care cert/re-cert  Problem List Patient Active  Problem List   Diagnosis Date Noted  . Hypoalbuminemia due to protein-calorie malnutrition ([place])   . Essential hypertension   . Cerebrovascular accident (CVA) due to occlusion of left posterior communicating artery ([place]) 12/10/2017  . Cerebrovascular accident (CVA) due to thrombosis of left posterior cerebral artery ([place])   . Benign essential HTN   . Morbid obesity ([place])   . OSA (obstructive sleep apnea)   . Enteritis due to Clostridium difficile   . Acute CVA (cerebrovascular accident) ([place]) 12/01/2017  . Acute arterial ischemic stroke, multifocal, posterior circulation ([place]) 12/01/2017    Quay Burow, OTR/L 12/25/2017, 1:06 PM  [facility] [address] [place], [place], [address] Phone: [phone]   Fax:  [phone]  Name: William Lowery MRN: [id number] Date of Birth: May 28, 1966

## 2017-12-26 ENCOUNTER — Telehealth: Payer: Self-pay | Admitting: Registered Nurse

## 2017-12-26 ENCOUNTER — Other Ambulatory Visit: Payer: Self-pay

## 2017-12-26 ENCOUNTER — Encounter: Payer: Self-pay | Admitting: Physical Therapy

## 2017-12-26 NOTE — Telephone Encounter (Signed)
Transitional Care call Transitional care Call Completed, Appointment Confirmed, Address Confirmed, New Patient Packet Mailed.  Transitional Care Call Questions answered by Ms. Kathlen Mody  Patient name: William Lowery DOB: 11-25-1965 1. Are you/is patient experiencing any problems since coming home? No a. Are there any questions regarding any aspect of care? No 2. Are there any questions regarding medications administration/dosing? No a. Are meds being taken as prescribed? Yes b. "Patient should review meds with caller to confirm" Medication List reviewed 3. Have there been any falls? No 4. Has Home Health been to the house and/or have they contacted you? NA: [facility] for [facility] a. If not, have you tried to contact them? NA b. Can we help you contact them? NA 5. Are bowels and bladder emptying properly? Yes a. Are there any unexpected incontinence issues? No b. If applicable, is patient following bowel/bladder programs? NA 6. Any fevers, problems with breathing, unexpected pain? No 7. Are there any skin problems or new areas of breakdown? No 8. Has the patient/family member arranged specialty MD follow up (ie cardiology/neurology/renal/surgical/etc.)?  Ms. Kathlen Mody in th process of obtaining appointments she states. a. Can we help arrange? No 9. Does the patient need any other services or support that we can help arrange? No 10. Are caregivers following through as expected in assisting the patient? Yes 11. Has the patient quit smoking, drinking alcohol, or using drugs as recommended? Ms. Kathlen Mody states [name] doesn't smoke, drink alcohol or use illicit drugs.  Appointment date/time 12/31/2017, arrival time 10:40 for 11:00 appointment  with Elray Mcgregor at  [facility]

## 2017-12-26 NOTE — Therapy (Signed)
[facility] [address] [place], [place], [address] Phone: [phone]   Fax:  [phone]  Physical Therapy Evaluation  Patient Details  Name: William Lowery MRN: [id number] Date of Birth: 1966/06/27 Referring Provider: Dr. Alger Simons   Encounter Date: 12/25/2017  PT End of Session - 12/26/17 1350    Visit Number  1    Number of Visits  17    Date for PT Re-Evaluation  02/22/18    Authorization Type  Self Pay    PT Start Time  1150    PT Stop Time  1236    PT Time Calculation (min)  46 min       Past Medical History:  Diagnosis Date  . Asthma    hx of  . Heart murmur    as an infant  . Hypertension   . Sleep apnea    wears CPAP    Past Surgical History:  Procedure Laterality Date  . bellybutton procedure     as a newborn  . IR ANGIO INTRA EXTRACRAN SEL COM CAROTID INNOMINATE BILAT MOD SED  12/01/2017  . IR ANGIO VERTEBRAL SEL VERTEBRAL UNI R MOD SED  12/01/2017  . IR CT HEAD LTD  12/01/2017  . IR INTRA CRAN STENT  12/01/2017  . IR PERCUTANEOUS ART THROMBECTOMY/INFUSION INTRACRANIAL INC DIAG ANGIO  12/01/2017  . RADIOLOGY WITH ANESTHESIA N/A 12/01/2017   Procedure: RADIOLOGY WITH ANESTHESIA CODE STROKE;  Surgeon: Radiologist, Medication, MD;  Location: [facility];  Service: Radiology;  Laterality: N/A;  . TEE WITHOUT CARDIOVERSION N/A 12/06/2017   Procedure: TRANSESOPHAGEAL ECHOCARDIOGRAM (TEE);  Surgeon: Pixie Casino, MD;  Location: [facility] ENDOSCOPY;  Service: Cardiovascular;  Laterality: N/A;    There were no vitals filed for this visit.   Subjective Assessment - 12/26/17 1341    Subjective  Pt presents to PT eval using RW but states he is not using it in the house; pt accompanied by his wife - states he was discharged home from [facility] last Wed., 12-19-17    Patient is accompained by:  Family member    Diagnostic tests  transesophageal echocardiogram 1-31-9    Patient Stated Goals  "I want to be able to  move regular"         [facility] PT Assessment - 12/26/17 0001      Assessment   Medical Diagnosis  Lt PCA CVA (multifocal)    Referring Provider  Dr. Alger Simons    Onset Date/Surgical Date  12/01/17    Hand Dominance  Right    Prior Therapy  inpt rehab PT, OT, ST       Precautions   Precautions  Fall      Balance Screen   Has the patient fallen in the past 6 months  No    Has the patient had a decrease in activity level because of a fear of falling?   No    Is the patient reluctant to leave their home because of a fear of falling?   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 LE's      Strength   Overall Strength  Within functional limits for tasks performed RLE      Transfers   Transfers  Sit to Stand    Number of Reps  Other reps (comment) 2      Ambulation/Gait   Ambulation/Gait  Yes    Ambulation/Gait Assistance  4: Min guard    Ambulation Distance (Feet)  50 Feet    Assistive device  None 40' with RW    Gait Pattern  Ataxic    Ambulation Surface  Level;Indoor    Gait velocity  11.22 secs= 2.92 ft/sec     Gait Comments  pt demonstrates decr. push off RLE in stance due to weak plantarflexors      Standardized Balance Assessment   Standardized Balance Assessment  Timed Up and Go  Test;Berg Balance Test      Berg Balance Test   Sit to Stand  Able to stand without using hands and stabilize independently    Standing Unsupported  Able to stand safely 2 minutes    Sitting with Back Unsupported but Feet Supported on Floor or Stool  Able to sit safely and securely 2 minutes    Stand to Sit  Sits safely with minimal use of hands    Transfers  Able to transfer safely, definite need of hands    Standing Unsupported with Eyes Closed  Able to stand 10 seconds with supervision    Standing Ubsupported with Feet Together  Able to place feet together independently and stand for 1 minute with supervision    From Standing, Reach Forward with Outstretched Arm  Can reach forward >12 cm safely (5") 9'    From Standing Position, Pick up Object from Floor  Able to pick up shoe, needs supervision    From Standing Position, Turn to Look Behind Over each Shoulder  Looks behind one side only/other side shows less weight shift    Turn 360 Degrees  Able to turn 360 degrees safely one side only in 4 seconds or less    Standing Unsupported, Alternately Place Feet on Step/Stool  Able to complete >2 steps/needs minimal assist    Standing Unsupported, One Foot in Front  Able to take small step independently and hold 30 seconds    Standing on One Leg  Tries to lift leg/unable to hold 3 seconds but remains standing independently    Total Score  41    Berg comment:  pt is unable to perform Rt SLS      Timed Up and Go Test   Normal TUG (seconds)  14.6 no device      RUE Tone   RUE Tone  Within Functional Limits             Objective measurements completed on examination: See above findings.              PT Education - 12/26/17 1349    Education provided  Yes  Education Details  eval results; heel raise exercise for Rt gastroc strengthening    Person(s) Educated  Patient;Spouse    Methods  Explanation;Handout    Comprehension  Verbalized understanding;Returned demonstration        PT Short Term Goals - 12/26/17 1413      PT SHORT TERM GOAL #1   Title  Increase Berg score from 41/56 to >/= 46/56 to reduce fall risk.    Baseline  41/56    Time  4    Period  Weeks    Status  New    Target Date  01/22/18      PT SHORT TERM GOAL #2   Title  Improve TUG score from 14.6 secs to </= 12.0 secs without device for improved functional mobility.    Baseline  14.6 secs with no device    Time  4    Period  Weeks    Status  New    Target Date  01/22/18      PT SHORT TERM GOAL #3   Title  Increase distance in 3" walk test by at least 100' with use of RW to demo improved endurance/activity tolerance.    Baseline  To be assessed next session    Time  4    Period  Weeks    Status  New    Target Date  01/22/18      PT SHORT TERM GOAL #4   Title  Negotiate steps (4) with no rail using a step by step sequence with SBA.    Time  4    Period  Weeks    Status  New    Target Date  01/22/18      PT SHORT TERM GOAL #5   Title  Independent in HEP for balance and RLE strengthening.    Time  4    Period  Weeks    Status  New    Target Date  01/22/18        PT Long Term Goals - 12/26/17 1418      PT LONG TERM GOAL #1   Title  Increase Berg score from 41/56 to >/= 51/56 to decr. fall risk.    Baseline  41/56 on 12-25-17    Time  8    Period  Weeks    Status  New    Target Date  02/22/18      PT LONG TERM GOAL #2   Title  Pt will amb. 1000' without device modified independently on all surfaces.     Baseline  pt amb. with RW 100' with SBA    Time  8    Period  Weeks    Status  New    Target Date  02/22/18      PT LONG TERM GOAL #3   Title  Negotiate 4 steps without rail using a step over step sequence.    Time  8    Period  Weeks    Status  New    Target Date  02/22/18      PT LONG TERM GOAL #4   Title  Independent in updated HEP, including community fitness options for continued HEP.    Time  8    Period  Weeks    Status  New    Target Date   02/22/18      PT LONG TERM GOAL #5   Title  Increase gait velocity from 2.92 ft/sec without device to >/= 3.7 ft/sec  without device for incr. gait efficiency.    Baseline  2.92 ft/sec with no device on 12-25-17    Time  8    Period  Weeks    Status  New    Target Date  02/22/18             Plan - 12/26/17 1351    Clinical Impression Statement  Pt is a 52 yr old male s/p Lt PCA CVA/acute ischemic multifocal CVA posterior circulation on 12-01-17.  Pt was hospitalized from 12-01-17 - 12-19-17 with inpatient rehab from 12-10-17 - 12-19-17 with D/C home with wife.  Pt is using a RW for assistance with ambulation but states he is not using RW in the home.  Pt presents with decreased balance and gait, decr. coordination RLE and weakness in Rt gastrocnemius resulting in decr. push off in stance phase of gait.   pt also demonstrates decreased endurance/activity tolerance    History and Personal Factors relevant to plan of care:  pt is self pay; has supportive spouse [name]); was self emplyed -owned transportation service; loop recorder implant during hospitalization    Clinical Presentation  Evolving    Clinical Presentation due to:  L PCA CVA - multi infarct    Clinical Decision Making  Moderate    Rehab Potential  Good    PT Frequency  2x / week    PT Duration  8 weeks    PT Treatment/Interventions  ADLs/Self Care Home Management;Stair training;Gait training;DME Instruction;Therapeutic activities;Therapeutic exercise;Balance training;Neuromuscular re-education;Patient/family education    PT Next Visit Plan  do 3" walk test (with RW); begin HEP for balance - kicks, marching, SLS, sidestepping, crossovers? ; check heel raise exercise given on 12-25-17; balance and gait training    PT Home Exercise Plan  balance HEP and walking program    Recommended Other Services  pt is receiving OT     Consulted and Agree with Plan of Care  Patient;Family member/caregiver    Family Member Consulted  wife [name]        Patient will benefit from skilled therapeutic intervention in order to improve the following deficits and impairments:  Abnormal gait, Cardiopulmonary status limiting activity, Decreased activity tolerance, Decreased balance, Decreased cognition, Decreased coordination, Decreased endurance, Decreased strength  Visit Diagnosis: Hemiplegia and hemiparesis following cerebral infarction affecting right dominant side ([place]) - Plan: PT plan of care cert/re-cert  Muscle weakness (generalized) - Plan: PT plan of care cert/re-cert  Other lack of coordination - Plan: PT plan of care cert/re-cert  Other abnormalities of gait and mobility - Plan: PT plan of care cert/re-cert  Unsteadiness on feet - Plan: PT plan of care cert/re-cert     Problem List Patient Active Problem List   Diagnosis Date Noted  . Hypoalbuminemia due to protein-calorie malnutrition ([place])   . Essential hypertension   . Cerebrovascular accident (CVA) due to occlusion of left posterior communicating artery ([place]) 12/10/2017  . Cerebrovascular accident (CVA) due to thrombosis of left posterior cerebral artery ([place])   . Benign essential HTN   . Morbid obesity ([place])   . OSA (obstructive sleep apnea)   . Enteritis due to Clostridium difficile   . Acute CVA (cerebrovascular accident) ([place]) 12/01/2017  . Acute arterial ischemic stroke, multifocal, posterior circulation ([place]) 12/01/2017    Qunisha Bryk, [place], PT 12/26/2017, 2:33 PM  [facility] [address] [place], [place], [address] Phone: [phone]   Fax:  [phone]  Name: William Lowery MRN: [id number] Date  of Birth: 09-Jul-1966

## 2017-12-27 ENCOUNTER — Ambulatory Visit: Payer: Self-pay | Admitting: Speech Pathology

## 2017-12-27 ENCOUNTER — Other Ambulatory Visit: Payer: Self-pay

## 2017-12-27 DIAGNOSIS — R4701 Aphasia: Secondary | ICD-10-CM

## 2017-12-27 DIAGNOSIS — R41841 Cognitive communication deficit: Secondary | ICD-10-CM

## 2017-12-28 NOTE — Therapy (Signed)
[facility] [address] [place], [place], [address] Phone: [phone]   Fax:  [phone]  Speech Language Pathology Evaluation  Patient Details  Name: William Lowery MRN: [id number] Date of Birth: 1966-06-17 Referring Provider: Dr. Alger Simons   Encounter Date: 12/27/2017  End of Session - 12/27/17 1448    Visit Number  1    Number of Visits  17    Date for SLP Re-Evaluation  03/01/18    SLP Start Time  1448    SLP Stop Time   1530    SLP Time Calculation (min)  42 min    Activity Tolerance  Patient tolerated treatment well       Past Medical History:  Diagnosis Date  . Asthma    hx of  . Heart murmur    as an infant  . Hypertension   . Sleep apnea    wears CPAP    Past Surgical History:  Procedure Laterality Date  . bellybutton procedure     as a newborn  . IR ANGIO INTRA EXTRACRAN SEL COM CAROTID INNOMINATE BILAT MOD SED  12/01/2017  . IR ANGIO VERTEBRAL SEL VERTEBRAL UNI R MOD SED  12/01/2017  . IR CT HEAD LTD  12/01/2017  . IR INTRA CRAN STENT  12/01/2017  . IR PERCUTANEOUS ART THROMBECTOMY/INFUSION INTRACRANIAL INC DIAG ANGIO  12/01/2017  . RADIOLOGY WITH ANESTHESIA N/A 12/01/2017   Procedure: RADIOLOGY WITH ANESTHESIA CODE STROKE;  Surgeon: Radiologist, Medication, MD;  Location: [facility];  Service: Radiology;  Laterality: N/A;  . TEE WITHOUT CARDIOVERSION N/A 12/06/2017   Procedure: TRANSESOPHAGEAL ECHOCARDIOGRAM (TEE);  Surgeon: Pixie Casino, MD;  Location: [facility] ENDOSCOPY;  Service: Cardiovascular;  Laterality: N/A;    There were no vitals filed for this visit.  Subjective Assessment - 12/27/17 1456    Subjective  "I had... retalking. That's not a word."    Currently in Pain?  No/denies         SLP Evaluation [facility] - 12/27/17 1456      SLP Visit Information   SLP Received On  12/27/17    Referring Provider  Dr. Alger Simons    Onset Date  12/01/17    Medical Diagnosis  Lt PCA CVA  (multifocal)       General Information   HPI  Patient is a 52 y.o.right handed male with history of hypertension, sleep apnea, obesity. Presented 12/01/2017 with right-sided numbness as well as intermittent headache and right eye visual blurriness. MRI showed multifocal acute ischemia within the posterior circulation territories predominantly within the left PCA distribution, including left occipital lobe and left thalamus. ST in CIR 12/06/17-12/12/17 addressed mild aphasia and problem solving.     Behavioral/Cognition  alert, cooperative    Mobility Status  uses a walker      Balance Screen   Has the patient fallen in the past 6 months  No    Has the patient had a decrease in activity level because of a fear of falling?   No    Is the patient reluctant to leave their home because of a fear of falling?   No      Prior Functional Status   Cognitive/Linguistic Baseline  Within functional limits    Type of Home  House     Lives With  Spouse    Available Support  Family    Vocation  Full time employment      Pain Assessment   Pain Assessment  No/denies pain      Cognition   Overall Cognitive Status  Impaired/Different from baseline Will assess formally in next 1-2 sessions      Auditory Comprehension   Overall Auditory Comprehension  Impaired    Yes/No Questions  Within Functional Limits    Commands  Within Functional Limits    Conversation  Simple    Other Conversation Comments  Paragraph comprehension 3/4    Interfering Components  Attention    Overall Auditory Comprehension Comments  requested repetition of instructions during testing; suspect attention vs auditory comprehension      Visual Recognition/Discrimination   Discrimination  Within Function Limits      Reading Comprehension   Reading Status  Not tested will assess      Expression   Primary Mode of Expression  Verbal      Verbal Expression   Overall Verbal Expression  Impaired    Initiation  No impairment     Automatic Speech  Name;Social Response    Level of Generative/Spontaneous Verbalization  Conversation    Repetition  No impairment    Naming  Impairment    Responsive  76-100% accurate 100%    Confrontation  50-74% accurate 44/60 BNT    Convergent  Not tested    Divergent  Not tested    Verbal Errors  Semantic paraphasias    Pragmatics  No impairment    Effective Techniques  Phonemic cues;Sentence completion    Non-Verbal Means of Communication  Not applicable      Written Expression   Dominant Hand  Right    Written Expression  Not tested Will assess in next 1-2 sessions      Oral Motor/Sensory Function   Overall Oral Motor/Sensory Function  Appears within functional limits for tasks assessed      Motor Speech   Overall Motor Speech  Appears within functional limits for tasks assessed      Standardized Assessments   Standardized Assessments   Boston Diagnostic Apashia Examiniation-3rd;Boston Naming Test-2nd edition    [place] Naming Test-2nd edition   44/60, <1 SD below mean for age/education                      SLP Education - 12/27/17 1448    Education provided  Yes    Education Details  recommended course of ST, proposed therapy goals, will continue testing cognition, reading and writing    Person(s) Educated  Patient;Spouse    Methods  Explanation    Comprehension  Verbalized understanding       SLP Short Term Goals - 12/27/17 1448      SLP SHORT TERM GOAL #1   Title  pt will use compensations for anomia in 10 minutes mod complex conversation     Time  4    Period  Weeks    Status  New      SLP SHORT TERM GOAL #2   Title  pt will name 8 items in mod abstract/complex category with rare min A     Time  4    Period  Weeks    Status  New      SLP SHORT TERM GOAL #3   Title  pt will participate in further assessment of language (reading, writing) and cognition and goals added PRN    Time  2    Period  Weeks    Status  New       SLP Long Term  Goals -  12/27/17 1448      SLP LONG TERM GOAL #1   Title  pt will demo functional verbal output in 10 minutes complex conversation over three sessions    Time  8    Period  Weeks    Status  New      SLP LONG TERM GOAL #2   Title  in 15 minutes mod complex/complex conversation pt will ask questions or comment appropriately to demo understanding, over 3 sessions    Time  [place] - 12/27/17 1448    Clinical Impression Statement  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 Pt's speech is fluent, and simple conversation is grossly functional, although with circumlocutions, dysnomia apparent with extended conversation and picture description. Confrontation naming impaired, though pt's errors are primarily semantic or verbal paraphasias (eg atlas, map vs globe, muffler vs muzzle, encyclopedia vs escalator). Phonemic, semantic and sentence completion cues were effective. Suspect cognitive deficits given decreased attention to testing instructions, paragraph comprehension tasks and pt's tangential responses to questions today. Today's assessment focused on pt's verbal expression; will complete formal cognitive assessment as well as assessment of reading comprehension, writing in next 1-2 visits and add goals PRN. 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 Pt would benefit from skilled ST to address expressive language and cognitive impairments in order to improve communication, reduce caregiver burden and maximize independence.     Speech Therapy Frequency  2x / week    Duration  -- 8 weeks or 16 additional visits    Treatment/Interventions  Cognitive reorganization;Multimodal communcation approach;Compensatory strategies;Language facilitation;Cueing hierarchy;Internal/external aids;Functional tasks;SLP instruction and feedback;Patient/family education    Potential to  Achieve Goals  Good    Consulted and Agree with Plan of Care  Patient       Patient will benefit from skilled therapeutic intervention in order to improve the following deficits and impairments:   Aphasia  Cognitive communication deficit    Problem List Patient Active Problem List   Diagnosis Date Noted  . Hypoalbuminemia due to protein-calorie malnutrition ([place])   . Essential hypertension   . Cerebrovascular accident (CVA) due to occlusion of left posterior communicating artery ([place]) 12/10/2017  . Cerebrovascular accident (CVA) due to thrombosis of left posterior cerebral artery ([place])   . Benign essential HTN   . Morbid obesity ([place])   . OSA (obstructive sleep apnea)   . Enteritis due to Clostridium difficile   . Acute CVA (cerebrovascular accident) ([place]) 12/01/2017  . Acute arterial ischemic stroke, multifocal, posterior circulation ([place]) 12/01/2017   Deneise Lever, MS, [place] 12/28/2017, 3:24 PM  [facility] [address] [place], [place], [address] Phone: [phone]   Fax:  [phone]  Name: William Lowery MRN: [id number] Date of Birth: October 20, 1966

## 2017-12-31 ENCOUNTER — Encounter: Payer: Self-pay | Admitting: Registered Nurse

## 2017-12-31 ENCOUNTER — Telehealth: Payer: Self-pay | Admitting: Registered Nurse

## 2017-12-31 ENCOUNTER — Encounter: Payer: Self-pay | Attending: Registered Nurse | Admitting: Registered Nurse

## 2017-12-31 VITALS — BP 136/90 | HR 80

## 2017-12-31 DIAGNOSIS — Z86718 Personal history of other venous thrombosis and embolism: Secondary | ICD-10-CM | POA: Insufficient documentation

## 2017-12-31 DIAGNOSIS — I63332 Cerebral infarction due to thrombosis of left posterior cerebral artery: Secondary | ICD-10-CM

## 2017-12-31 DIAGNOSIS — G4730 Sleep apnea, unspecified: Secondary | ICD-10-CM | POA: Insufficient documentation

## 2017-12-31 DIAGNOSIS — I10 Essential (primary) hypertension: Secondary | ICD-10-CM

## 2017-12-31 DIAGNOSIS — I6930 Unspecified sequelae of cerebral infarction: Secondary | ICD-10-CM | POA: Insufficient documentation

## 2017-12-31 NOTE — Progress Notes (Signed)
Subjective:    Patient ID: William Lowery, male    DOB: [birth year], 52 y.o.   MRN: [id number]  HPI: [name] is a 52 year old male who is here for transitional care visit follow up for Left Posterior Cerebral Artery Infarction, Hypertension and DVT. He's receiving Therap[facility] at [facility] ( Physical, Occupational and Speech). He denies any pain, reports good appetite.   Pain Inventory Average Pain 0 Pain Right Now 0 My pain is na  In the last 24 hours, has pain interfered with the following? General activity 0 Relation with others 0 Enjoyment of life 0 What TIME of day is your pain at its worst? na Sleep (in general) Fair  Pain is worse with: na Pain improves with: na Relief from Meds: 0  Mobility use a walker ability to climb steps?  yes do you drive?  yes  Function Do you have any goals in this area?  yes  Neuro/Psych No problems in this area  Prior Studies Any changes since last visit?  no  Physicians involved in your care Any changes since last visit?  no   Family History  Problem Relation Age of Onset  . Colon cancer Neg Hx   . Esophageal cancer Neg Hx   . Rectal cancer Neg Hx   . Stomach cancer Neg Hx    Social History   Socioeconomic History  . Marital status: Married    Spouse name: None  . Number of children: None  . Years of education: None  . Highest education level: None  Social Needs  . Financial resource strain: None  . Food insecurity - worry: None  . Food insecurity - inability: None  . Transportation needs - medical: None  . Transportation needs - non-medical: None  Occupational History  . None  Tobacco Use  . Smoking status: Never Smoker  . Smokeless tobacco: Former Systems developer    Types: Snuff  Substance and Sexual Activity  . Alcohol use: No    Alcohol/week: 0.0 oz    Comment: socially  . Drug use: No  . Sexual activity: None  Other Topics Concern  . None  Social History Narrative  . None   Past Surgical  History:  Procedure Laterality Date  . bellybutton procedure     as a newborn  . IR ANGIO INTRA EXTRACRAN SEL COM CAROTID INNOMINATE BILAT MOD SED  12/01/2017  . IR ANGIO VERTEBRAL SEL VERTEBRAL UNI R MOD SED  12/01/2017  . IR CT HEAD LTD  12/01/2017  . IR INTRA CRAN STENT  12/01/2017  . IR PERCUTANEOUS ART THROMBECTOMY/INFUSION INTRACRANIAL INC DIAG ANGIO  12/01/2017  . RADIOLOGY WITH ANESTHESIA N/A 12/01/2017   Procedure: RADIOLOGY WITH ANESTHESIA CODE STROKE;  Surgeon: Radiologist, Medication, MD;  Location: [place];  Service: Radiology;  Laterality: N/A;  . TEE WITHOUT CARDIOVERSION N/A 12/06/2017   Procedure: TRANSESOPHAGEAL ECHOCARDIOGRAM (TEE);  Surgeon: Pixie Casino, MD;  Location: [facility] ENDOSCOPY;  Service: Cardiovascular;  Laterality: N/A;   Past Medical History:  Diagnosis Date  . Asthma    hx of  . Heart murmur    as an infant  . Hypertension   . Sleep apnea    wears CPAP   There were no vitals taken for this visit.  Opioid Risk Score:   Fall Risk Score:  `1  Depression screen PHQ 2/9  Depression screen [facility] 2/9 09/07/2016 11/05/2015  Decreased Interest 0 0  Down, Depressed, Hopeless 0 0  PHQ - 2 Score  0 0     Review of Systems  Constitutional: Negative.   HENT: Negative.   Eyes: Negative.   Respiratory: Negative.   Cardiovascular: Negative.   Gastrointestinal: Negative.   Endocrine: Negative.   Genitourinary: Negative.   Musculoskeletal: Positive for gait problem.  Skin: Negative.   Allergic/Immunologic: Negative.   Hematological: Negative.   Psychiatric/Behavioral: Negative.   All other systems reviewed and are negative.      Objective:   Physical Exam  Constitutional: He is oriented to person, place, and time. He appears well-developed and well-nourished.  HENT:  Head: Normocephalic and atraumatic.  Neck: Normal range of motion. Neck supple.  Cardiovascular: Normal rate and regular rhythm.  Pulmonary/Chest: Effort normal and breath sounds normal.    Musculoskeletal:  Normal Muscle Bulk and Muscle Testing Reveals: Upper Extremities: Full ROM and Muscle Strength on the Right 3/5 and Left 4/5 Lower Extremities Full ROM and Muscle Strength 5/5 Arises from Table with walker  Gait is steady with normal steps Narrow Based Gait  Neurological: He is alert and oriented to person, place, and time.  Skin: Skin is warm and dry.  Psychiatric: He has a normal mood and affect.  Nursing note and vitals reviewed.         Assessment & Plan:  1. CVA due to thrombosis of left posterior cerebral artery: Continue with Therapies: Neurology Following. Education Provided and No Driving was re-iterated, he verbalizes understanding.  2. Essential HTN: Controlled: Continue current medication regime. Continue to Monitor. PCP Following   30 minutes of face to face patient care time was spent during this visit. All questions were encouraged and answered.  F/U in 4-6 weeks with Dr. Naaman Plummer

## 2018-01-01 ENCOUNTER — Ambulatory Visit: Payer: Self-pay | Admitting: Occupational Therapy

## 2018-01-02 ENCOUNTER — Ambulatory Visit: Payer: Self-pay | Admitting: Occupational Therapy

## 2018-01-03 ENCOUNTER — Ambulatory Visit: Payer: Self-pay | Admitting: Physical Therapy

## 2018-01-08 ENCOUNTER — Encounter: Payer: Self-pay | Admitting: Physical Therapy

## 2018-01-08 ENCOUNTER — Encounter: Payer: Self-pay | Admitting: Occupational Therapy

## 2018-01-08 ENCOUNTER — Encounter: Payer: Self-pay | Admitting: Internal Medicine

## 2018-01-08 ENCOUNTER — Ambulatory Visit (INDEPENDENT_AMBULATORY_CARE_PROVIDER_SITE_OTHER): Payer: Self-pay | Admitting: Internal Medicine

## 2018-01-08 ENCOUNTER — Ambulatory Visit: Payer: PRIVATE HEALTH INSURANCE | Admitting: Physical Therapy

## 2018-01-08 ENCOUNTER — Ambulatory Visit: Payer: PRIVATE HEALTH INSURANCE | Attending: Physical Medicine & Rehabilitation | Admitting: Occupational Therapy

## 2018-01-08 VITALS — BP 124/85 | HR 89 | Ht 70.0 in | Wt 284.2 lb

## 2018-01-08 DIAGNOSIS — M6281 Muscle weakness (generalized): Secondary | ICD-10-CM

## 2018-01-08 DIAGNOSIS — I350 Nonrheumatic aortic (valve) stenosis: Secondary | ICD-10-CM

## 2018-01-08 DIAGNOSIS — R270 Ataxia, unspecified: Secondary | ICD-10-CM | POA: Insufficient documentation

## 2018-01-08 DIAGNOSIS — R2681 Unsteadiness on feet: Secondary | ICD-10-CM

## 2018-01-08 DIAGNOSIS — R4701 Aphasia: Secondary | ICD-10-CM | POA: Diagnosis present

## 2018-01-08 DIAGNOSIS — R41841 Cognitive communication deficit: Secondary | ICD-10-CM | POA: Diagnosis present

## 2018-01-08 DIAGNOSIS — I77810 Thoracic aortic ectasia: Secondary | ICD-10-CM | POA: Insufficient documentation

## 2018-01-08 DIAGNOSIS — I69351 Hemiplegia and hemiparesis following cerebral infarction affecting right dominant side: Secondary | ICD-10-CM

## 2018-01-08 DIAGNOSIS — Z8673 Personal history of transient ischemic attack (TIA), and cerebral infarction without residual deficits: Secondary | ICD-10-CM | POA: Insufficient documentation

## 2018-01-08 DIAGNOSIS — R2689 Other abnormalities of gait and mobility: Secondary | ICD-10-CM | POA: Insufficient documentation

## 2018-01-08 DIAGNOSIS — R278 Other lack of coordination: Secondary | ICD-10-CM | POA: Diagnosis present

## 2018-01-08 DIAGNOSIS — I69318 Other symptoms and signs involving cognitive functions following cerebral infarction: Secondary | ICD-10-CM | POA: Diagnosis present

## 2018-01-08 DIAGNOSIS — E782 Mixed hyperlipidemia: Secondary | ICD-10-CM | POA: Insufficient documentation

## 2018-01-08 DIAGNOSIS — R41842 Visuospatial deficit: Secondary | ICD-10-CM

## 2018-01-08 NOTE — Patient Instructions (Addendum)
Perform these at the counter with left hand on counter as needed for balance: "I love a Parade" Lift    Slowly, perform high knee marching forward, holding each knee in the air for 3 seconds. Then slowly march backwards, holding knee in the air for 3 seconds.  Perform 3 laps each way. 1-2 times a day.  http://gt2.exer.us/344   Copyright  VHI. All rights reserved.    Feet Heel-Toe "Tandem"    Holding counter with left UE: walk a straight line forward bringing one foot directly in front of the other, then walk a straight line backwards bringing one foot directly behind the other one.  Perform  3 laps each way. 1-2 times a day.  Copyright  VHI. All rights reserved.  Walking on Toes   At counter for balance as needed: Walk on toes forward while continuing on a straight path, and then backwards on toes to starting position. Repeat 3 laps each way. Do _1-2___ sessions per day.  Copyright  VHI. All rights reserved.  Braiding   At counter for  Balance as needed: walking sideways with following pattern "front, step, back, step" along counter, repeat the pattern the other way to start position. Repeat 3 laps each direction. Do __1-2__ sessions per day.  Copyright  VHI. All rights reserved.

## 2018-01-08 NOTE — Therapy (Signed)
[facility] [address] [place], [place], [address] Phone: [phone]   Fax:  [phone]  Occupational Therapy Treatment  Patient Details  Name: William Lowery MRN: [id number] Date of Birth: 1966-11-06 Referring Provider: Dr. Alger Simons   Encounter Date: 01/08/2018  OT End of Session - 01/08/18 1632    Visit Number  2    Number of Visits  16    Date for OT Re-Evaluation  02/19/18    Authorization Type  self pay    OT Start Time  1534    OT Stop Time  1620    OT Time Calculation (min)  46 min       Past Medical History:  Diagnosis Date  . Asthma    hx of  . Heart murmur    as an infant  . Hypertension   . Sleep apnea    wears CPAP    Past Surgical History:  Procedure Laterality Date  . bellybutton procedure     as a newborn  . IR ANGIO INTRA EXTRACRAN SEL COM CAROTID INNOMINATE BILAT MOD SED  12/01/2017  . IR ANGIO VERTEBRAL SEL VERTEBRAL UNI R MOD SED  12/01/2017  . IR CT HEAD LTD  12/01/2017  . IR INTRA CRAN STENT  12/01/2017  . IR PERCUTANEOUS ART THROMBECTOMY/INFUSION INTRACRANIAL INC DIAG ANGIO  12/01/2017  . RADIOLOGY WITH ANESTHESIA N/A 12/01/2017   Procedure: RADIOLOGY WITH ANESTHESIA CODE STROKE;  Surgeon: Radiologist, Medication, MD;  Location: [place];  Service: Radiology;  Laterality: N/A;  . TEE WITHOUT CARDIOVERSION N/A 12/06/2017   Procedure: TRANSESOPHAGEAL ECHOCARDIOGRAM (TEE);  Surgeon: Pixie Casino, MD;  Location: [facility] ENDOSCOPY;  Service: Cardiovascular;  Laterality: N/A;    There were no vitals filed for this visit.  Subjective Assessment - 01/08/18 1537    Subjective   I have had a very busy day    Patient is accompained by:  Family member wife    Pertinent History  12/01/2017 L PCA CVA     Patient Stated Goals  I hope to get as much of my right hand working again as I can    Currently in Pain?  No/denies                   OT Treatments/Exercises (OP) - 01/08/18 0001       ADLs   ADL Comments  Reviewed goals and POC - pt and wife in agreement and provided in writing. 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  Pt requires significant repetition and practice and benefits from demonstration, occassional hand over hand and doing first with left hand.        Exercises   Exercises  Hand      Hand Exercises   Other Hand Exercises  Issued putty exercises for grip and pinch (red) as well as limited coordination HEP - after instruction and practice pt and wife able to return demonstrate and verbalize understanding. Pt with signficant apraxia when attempting to use R hand.             OT Education - 01/08/18 1627    Education provided  Yes    Education Details  HEP for grip (red putty) and limited coordination; apraxia and interruption strategy.     Person(s) Educated  Patient;Spouse    Methods  Explanation;Demonstration;Tactile cues;Verbal cues;Handout    Comprehension  Verbalized understanding;Returned demonstration with wife's  assistance   with wife's assistance      OT Short Term Goals - 01/08/18 1631      OT SHORT TERM GOAL #1   Title  Pt and wife will be mod I with HEP for coordination, RUE strength and cognition - due 01/22/2018    Status  On-going      OT SHORT TERM GOAL #2   Title  Pt will demonstrate improved gross control of RUE as evidenced by increasing by at least 5 blocks on Box and Blocks to assist with basic ADL's    Status  On-going      OT SHORT TERM GOAL #3   Title  Pt will be mod I with bathing at shower level using AE for back prn    Status  On-going      OT SHORT TERM GOAL #4   Title  Pt will be mod I with dressing    Status  On-going      OT SHORT TERM GOAL #5   Title  Pt will be mod I with toilet hygiene    Status  On-going      OT SHORT TERM GOAL #6   Title  Pt will demonstrate inmproved grip strength by at least 5 pounds to  assist with basic ADL and IADL tasks (baseline =20 pounds)    Status  On-going      OT SHORT TERM GOAL #7   Title  Pt will use RUE as stabilizer during ADL and IADL tasks    Status  On-going        OT Long Term Goals - 01/08/18 1631      OT LONG TERM GOAL #1   Title  Pt will be mod I with upgraded HEP for cognition and RUE funcitonal use - 02/19/2018    Status  On-going      OT LONG TERM GOAL #2   Title  Pt will demonstrate improved gross motor control of RUE as evidenced by improving by at least 8 blocks on BOx and blocks to assist with IADL's    Status  On-going      OT LONG TERM GOAL #3   Title  Pt will demonstrate improved grip strength by at least 8 pounds to assist with IADL tasks ( baseline= 20 pounds)    Status  On-going      OT LONG TERM GOAL #4   Title  Pt will demonstrate ability for basic problem solving and organization with familiar functional task.    Status  On-going      OT LONG TERM GOAL #5   Title  Pt will demonstrate ability to write name with 100% legibility    Status  On-going      OT LONG TERM GOAL #6   Title  Pt will demonstrate ability for environmental scanning during physical task with 90% accuracy.    Status  On-going      OT LONG TERM GOAL #7   Title  Pt will demonstrate ability to complete functional task on outdoor uneven surfaces involving basic problem solving and organization.     Status  On-going            Plan - 01/08/18 1632    Clinical Impression Statement  Pt progressing toward goals. Pt with very supportive wife.    Occupational Profile and client history currently impacting functional performance  PMH: morbid obesity, HTN, sleep apnea, small remote R cerebellar CVA, asthma. Pt with loop recorder!!!  Occupational performance deficits (Please refer to evaluation for details):  ADL's;IADL's;Work;Leisure;Social Participation    Rehab Potential  Good    Current Impairments/barriers affecting progress:  apraxia    OT Frequency   2x / week    OT Duration  8 weeks    OT Treatment/Interventions  Self-care/ADL training;DME and/or AE instruction;Neuromuscular education;Therapeutic exercise;Therapist, nutritional;Therapeutic activities;Cognitive remediation/compensation;Visual/perceptual remediation/compensation;Patient/family education;Balance training    Plan  review goals and POC, initiate HEP for coordination as able, MOCA if not done by ST, futher assess visual scanning, NMR for RUE funtional use including weight bearing and closed chain activities, balance, incorporate cognition     Family Member Consulted  wife [name]       Patient will benefit from skilled therapeutic intervention in order to improve the following deficits and impairments:  Abnormal gait, Decreased activity tolerance, Decreased balance, Decreased coordination, Decreased cognition, Decreased knowledge of use of DME, Decreased mobility, Decreased safety awareness, Difficulty walking, Decreased strength, Impaired UE functional use, Impaired sensation, Impaired vision/preception, Obesity  Visit Diagnosis: Hemiplegia and hemiparesis following cerebral infarction affecting right dominant side (HCC)  Muscle weakness (generalized)  Other lack of coordination  Unsteadiness on feet  Other symptoms and signs involving cognitive functions following cerebral infarction  Visuospatial deficit    Problem List Patient Active Problem List   Diagnosis Date Noted  . Moderate aortic stenosis 01/08/2018  . Ascending aorta dilatation (HCC) 01/08/2018  . Mixed hyperlipidemia 01/08/2018  . History of recent stroke 01/08/2018  . Hypoalbuminemia due to protein-calorie malnutrition ([place])   . Essential hypertension   . Cerebrovascular accident (CVA) due to occlusion of left posterior communicating artery ([place]) 12/10/2017  . Cerebrovascular accident (CVA) due to thrombosis of left posterior cerebral artery ([place])   . Benign essential HTN   . Morbid obesity  ([place])   . OSA (obstructive sleep apnea)   . Enteritis due to Clostridium difficile   . Acute CVA (cerebrovascular accident) ([place]) 12/01/2017  . Acute arterial ischemic stroke, multifocal, posterior circulation ([place]) 12/01/2017    Quay Burow, OTR/L 01/08/2018, 4:34 PM  [facility] [address] [place], [place], [address] Phone: [phone]   Fax:  [phone]  Name: William Lowery MRN: [id number] Date of Birth: 01/31/1966

## 2018-01-08 NOTE — Patient Instructions (Signed)
1. Grip Strengthening (Resistive Putty)  Red putty   Squeeze putty using thumb and all fingers. Repeat _20___ times. Do __2__ sessions per day.   2. Roll putty into tube on table and pinch between index,middle and thumb.  Do 5. Do 2 sessions a day.        Coordination Activities  Perform the following activities for 15-20  minutes 1-2  times per day with right hand(s).   Toss ball between hands.  Toss ball in air and catch with the same hand.  Flip cards 1.  DO NOT WORRY ABOUT SPEED RIGHT NOW.  Go slow and focus on accuracy  Pick up coins and place in container or coin bank.  Screw together nuts and bolts, then unfasten. Start with bigger screws and nuts and work toward smaller ones. Make sure your right hand is doing the turning.   STOP or interruption strategy if the hand isn't behaving.      Copyright  VHI. All rights reserved.

## 2018-01-08 NOTE — Progress Notes (Addendum)
OFFICE NOTE  Chief Complaint:  Follow-up stroke  Primary Care Physician: Deland Pretty, MD  HPI:  William Lowery is a 51 y.o. male with a past medial history significant for attention, obstructive sleep apnea and recent presentation in January 2019 of right face, arm and leg numbness consistent with stroke.  He is a new patient to cardiology.  I had seen him in the hospital, but only to perform her procedure.  On presentation he had noted headache with right eye blurriness that resolved.  Afterwards he developed this significant numbness and presented for evaluation.  He was found to have acute stroke of the left posterior cerebral artery which was felt to be ischemic.  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  I felt the time it is feasible that he could have had some embolization of calcified material from this valve that led to his stroke.  He was seen by infectious disease, however felt that this mass was not likely vegetation.  Blood cultures were drawn however resulted in no growth.  He had no infective symptoms.  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  He does have a history of sleep apnea on CPAP but has been compliant with that for years.  While this may increase his risk of atrial fibrillation, there is no evidence that he has had any A. fib and I did not feel strongly about monitoring for it.  PMHx:  Past Medical History:  Diagnosis Date  . Asthma    hx of  . Heart murmur    as an infant  . Hypertension   . Sleep apnea    wears CPAP    Past Surgical History:  Procedure Laterality Date  . bellybutton procedure     as a newborn  . IR ANGIO INTRA EXTRACRAN SEL COM CAROTID INNOMINATE BILAT MOD SED  12/01/2017  . IR  ANGIO VERTEBRAL SEL VERTEBRAL UNI R MOD SED  12/01/2017  . IR CT HEAD LTD  12/01/2017  . IR INTRA CRAN STENT  12/01/2017  . IR PERCUTANEOUS ART THROMBECTOMY/INFUSION INTRACRANIAL INC DIAG ANGIO  12/01/2017  . RADIOLOGY WITH ANESTHESIA N/A 12/01/2017   Procedure: RADIOLOGY WITH ANESTHESIA CODE STROKE;  Surgeon: Radiologist, Medication, MD;  Location: [place];  Service: Radiology;  Laterality: N/A;  . TEE WITHOUT CARDIOVERSION N/A 12/06/2017   Procedure: TRANSESOPHAGEAL ECHOCARDIOGRAM (TEE);  Surgeon: Pixie Casino, MD;  Location: [facility] ENDOSCOPY;  Service: Cardiovascular;  Laterality: N/A;    FAMHx:  Family History  Problem Relation Age of Onset  . COPD Mother   . Colon cancer Neg Hx   . Esophageal cancer Neg Hx   . Rectal cancer Neg Hx   . Stomach cancer Neg Hx     SOCHx:   Lowery that  has never smoked. He has quit using smokeless tobacco. His smokeless tobacco use included snuff. He Lowery that he does not drink alcohol or use drugs.  ALLERGIES:  No Known Allergies  ROS: Pertinent items noted in HPI and remainder of comprehensive ROS otherwise negative.  HOME MEDS: Current Outpatient Medications on File Prior to Visit  Medication Sig Dispense Refill  . aspirin EC 81 MG EC tablet Take 1 tablet (81 mg total) by mouth daily.    Marland Kitchen atorvastatin (  LIPITOR) 10 MG tablet Take 1 tablet (10 mg total) by mouth daily at 6 PM. 30 tablet 1  . B Complex Vitamins (B-COMPLEX/B-12) TABS Take 1 tablet by mouth daily.    . Cholecalciferol (VITAMIN D PO) Take by mouth daily.    Marland Kitchen lactobacillus acidophilus (BACID) TABS tablet Take 2 tablets by mouth 3 (three) times daily. 90 tablet 0  . losartan (COZAAR) 50 MG tablet Take 1 tablet (50 mg total) by mouth daily. 30 tablet 0  . ticagrelor (BRILINTA) 90 MG TABS tablet Take 1 tablet (90 mg total) by mouth 2 (two) times daily. 60 tablet 1  . VITAMIN E PO Take by mouth daily.     No current facility-administered medications on file prior to visit.      LABS/IMAGING: No results found for this or any previous visit (from the past 48 hour(s)). No results found.  LIPID PANEL:    Component Value Date/Time   CHOL 144 12/02/2017 0535   TRIG 246 (H) 12/02/2017 0535   TRIG 244 (H) 12/02/2017 0535   HDL 37 (L) 12/02/2017 0535   CHOLHDL 3.9 12/02/2017 0535   VLDL 49 (H) 12/02/2017 0535   LDLCALC 58 12/02/2017 0535     WEIGHTS: Wt Readings from Last 3 Encounters:  01/08/18 284 lb 3.2 oz (128.9 kg)  12/19/17 287 lb 11.2 oz (130.5 kg)  12/10/17 (!) 304 lb 14.3 oz (138.3 kg)    VITALS: BP 124/85   Pulse 89   Ht 5\' 10"  (1.778 m)   Wt 284 lb 3.2 oz (128.9 kg)   SpO2 97%   BMI 40.78 kg/m   EXAM: General appearance: alert and no distress Neck: no carotid bruit, no JVD and thyroid not enlarged, symmetric, no tenderness/mass/nodules Lungs: clear to auscultation bilaterally Heart: regular rate and rhythm, S1, S2 normal and systolic murmur: systolic ejection 3/6, crescendo at 2nd right intercostal space Abdomen: soft, non-tender; bowel sounds normal; no masses,  no organomegaly Extremities: extremities normal, atraumatic, no cyanosis or edema Pulses: 2+ and symmetric Skin: Skin color, texture, turgor normal. No rashes or lesions Neurologic: Grossly normal Psych: Pleasant  EKG: Deferred - personally reviewed  ASSESSMENT: 1. 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 Recent stroke 3. OSA on CPAP 4. Hypertension 5. Morbid obesity  PLAN: 1.   William Lowery had a recent stroke, possibly related to atheroembolic event from a calcified right coronary cost.  He has a long-standing history of murmur since he was a child.  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  We will need to follow this with echocardiography to ensure no further significant stenosis.  He is on aspirin and Brilinta with recent cerebral stent.  His sleep apnea is treated and he is compliant with CPAP.  Blood pressures been well  controlled.  His recent LDL cholesterol was 58, with total cholesterol 144, HDL 37 and triglycerides 246.  I agree with continuing atorvastatin and we may additionally consider treatment of triglycerides if this cannot be further mitigated with dietary changes.  Follow-up annually with a repeat echocardiogram.  Pixie Casino, MD, [facility], [place] Director of the Advanced Lipid Disorders &  Cardiovascular Risk Reduction Clinic Diplomate of the American Board of Clinical Lipidology Attending Cardiologist  Direct Dial: [phone]  Fax: [phone]  Website:  [email or web address] 01/08/2018, 1:06 PM

## 2018-01-08 NOTE — Addendum Note (Signed)
Addended by: Pixie Casino on: 01/08/2018 02:07 PM   Modules accepted: Level of Service

## 2018-01-08 NOTE — Patient Instructions (Addendum)
Dr. Debara Pickett has recommended an ECHOCARDIOGRAM in 1 year (January 2020)  Your physician wants you to follow-up in: ONE YEAR with Dr. Debara Pickett after your echo. You will receive a reminder letter in the mail two months in advance. If you don't receive a letter, please call our office to schedule the follow-up appointment.

## 2018-01-09 ENCOUNTER — Ambulatory Visit: Payer: PRIVATE HEALTH INSURANCE | Admitting: Occupational Therapy

## 2018-01-09 ENCOUNTER — Encounter: Payer: Self-pay | Admitting: Occupational Therapy

## 2018-01-09 DIAGNOSIS — I69351 Hemiplegia and hemiparesis following cerebral infarction affecting right dominant side: Secondary | ICD-10-CM | POA: Diagnosis not present

## 2018-01-09 DIAGNOSIS — I69318 Other symptoms and signs involving cognitive functions following cerebral infarction: Secondary | ICD-10-CM

## 2018-01-09 DIAGNOSIS — R2681 Unsteadiness on feet: Secondary | ICD-10-CM

## 2018-01-09 DIAGNOSIS — R41842 Visuospatial deficit: Secondary | ICD-10-CM

## 2018-01-09 DIAGNOSIS — R278 Other lack of coordination: Secondary | ICD-10-CM

## 2018-01-09 DIAGNOSIS — M6281 Muscle weakness (generalized): Secondary | ICD-10-CM

## 2018-01-09 NOTE — Therapy (Signed)
[facility] [address] [place], [place], [address] Phone: [phone]   Fax:  [phone]  Physical Therapy Treatment  Patient Details  Name: William Lowery MRN: [id number] Date of Birth: 22-Sep-1966 Referring Provider: Dr. Alger Simons   Encounter Date: 01/08/2018  PT End of Session - 01/08/18 1622    Visit Number  2    Number of Visits  17    Date for PT Re-Evaluation  02/22/18    Authorization Type  Self Pay    PT Start Time  1620    PT Stop Time  1658    PT Time Calculation (min)  38 min    Equipment Utilized During Treatment  Gait belt    Activity Tolerance  Patient tolerated treatment well    Behavior During Therapy  [facility] for tasks assessed/performed       Past Medical History:  Diagnosis Date  . Asthma    hx of  . Heart murmur    as an infant  . Hypertension   . Sleep apnea    wears CPAP    Past Surgical History:  Procedure Laterality Date  . bellybutton procedure     as a newborn  . IR ANGIO INTRA EXTRACRAN SEL COM CAROTID INNOMINATE BILAT MOD SED  12/01/2017  . IR ANGIO VERTEBRAL SEL VERTEBRAL UNI R MOD SED  12/01/2017  . IR CT HEAD LTD  12/01/2017  . IR INTRA CRAN STENT  12/01/2017  . IR PERCUTANEOUS ART THROMBECTOMY/INFUSION INTRACRANIAL INC DIAG ANGIO  12/01/2017  . RADIOLOGY WITH ANESTHESIA N/A 12/01/2017   Procedure: RADIOLOGY WITH ANESTHESIA CODE STROKE;  Surgeon: Radiologist, Medication, MD;  Location: [place];  Service: Radiology;  Laterality: N/A;  . TEE WITHOUT CARDIOVERSION N/A 12/06/2017   Procedure: TRANSESOPHAGEAL ECHOCARDIOGRAM (TEE);  Surgeon: Pixie Casino, MD;  Location: [facility] ENDOSCOPY;  Service: Cardiovascular;  Laterality: N/A;    There were no vitals filed for this visit.  Subjective Assessment - 01/08/18 1622    Subjective  No new complaitns. No falls or pain to report.     Patient is accompained by:  Family member    Diagnostic tests  transesophageal echocardiogram 1-31-9    Patient Stated Goals  "I want to be able to move regular"    Currently in Pain?  No/denies    Pain Score  0-No pain         [facility] PT Assessment - 01/08/18 1625      6 Minute Walk- Baseline   6 Minute Walk- Baseline  yes    BP (mmHg)  130/95  (Abnormal)     HR (bpm)  78    02 Sat (%RA)  98 %    Modified Borg Scale for Dyspnea  0- Nothing at all    Perceived Rate of Exertion (Borg)  6-      6 Minute walk- Post Test   6 Minute Walk Post Test  yes    BP (mmHg)  136/91  (Abnormal)     HR (bpm)  75    02 Sat (%RA)  97 %    Modified Borg Scale for Dyspnea  0- Nothing at all    Perceived Rate of Exertion (Borg)  6-      6 minute walk test results    Aerobic Endurance Distance Walked  494    Endurance additional comments  3 minute walk test with no AD. min guard assist with no rest breaks taken.  issued the following to pt's HEP today with min guard assist for balance and cues on posture/ex technique. Perform these at the counter with left hand on counter as needed for balance: "I love a Parade" Lift    Slowly, perform high knee marching forward, holding each knee in the air for 3 seconds. Then slowly march backwards, holding knee in the air for 3 seconds.  Perform 3 laps each way. 1-2 times a day.  http://gt2.exer.us/344   Copyright  VHI. All rights reserved.    Feet Heel-Toe "Tandem"    Holding counter with left UE: walk a straight line forward bringing one foot directly in front of the other, then walk a straight line backwards bringing one foot directly behind the other one.  Perform  3 laps each way. 1-2 times a day.  Copyright  VHI. All rights reserved.  Walking on Toes   At counter for balance as needed: Walk on toes forward while continuing on a straight path, and then backwards on toes to starting position. Repeat 3 laps each way. Do _1-2___ sessions per day.  Copyright  VHI. All rights reserved.  Braiding   At counter for  Balance as needed:  walking sideways with following pattern "front, step, back, step" along counter, repeat the pattern the other way to start position. Repeat 3 laps each direction. Do __1-2__ sessions per day.  Copyright  VHI. All rights reserved.       PT Education - 01/08/18 1654    Education provided  Yes    Education Details  HEP for balance    Person(s) Educated  Patient;Spouse    Methods  Explanation;Demonstration;Verbal cues;Handout    Comprehension  Verbalized understanding;Returned demonstration;Need further instruction;Verbal cues required       PT Short Term Goals - 12/26/17 1413      PT SHORT TERM GOAL #1   Title  Increase Berg score from 41/56 to >/= 46/56 to reduce fall risk.    Baseline  41/56    Time  4    Period  Weeks    Status  New    Target Date  01/22/18      PT SHORT TERM GOAL #2   Title  Improve TUG score from 14.6 secs to </= 12.0 secs without device for improved functional mobility.    Baseline  14.6 secs with no device    Time  4    Period  Weeks    Status  New    Target Date  01/22/18      PT SHORT TERM GOAL #3   Title  Increase distance in 3" walk test by at least 100' with use of RW to demo improved endurance/activity tolerance.    Baseline  To be assessed next session    Time  4    Period  Weeks    Status  New    Target Date  01/22/18      PT SHORT TERM GOAL #4   Title  Negotiate steps (4) with no rail using a step by step sequence with SBA.    Time  4    Period  Weeks    Status  New    Target Date  01/22/18      PT SHORT TERM GOAL #5   Title  Independent in HEP for balance and RLE strengthening.    Time  4    Period  Weeks    Status  New    Target Date  01/22/18  PT Long Term Goals - 12/26/17 1418      PT LONG TERM GOAL #1   Title  Increase Berg score from 41/56 to >/= 51/56 to decr. fall risk.    Baseline  41/56 on 12-25-17    Time  8    Period  Weeks    Status  New    Target Date  02/22/18      PT LONG TERM GOAL #2   Title   Pt will amb. 1000' without device modified independently on all surfaces.     Baseline  pt amb. with RW 100' with SBA    Time  8    Period  Weeks    Status  New    Target Date  02/22/18      PT LONG TERM GOAL #3   Title  Negotiate 4 steps without rail using a step over step sequence.    Time  8    Period  Weeks    Status  New    Target Date  02/22/18      PT LONG TERM GOAL #4   Title  Independent in updated HEP, including community fitness options for continued HEP.    Time  8    Period  Weeks    Status  New    Target Date  02/22/18      PT LONG TERM GOAL #5   Title  Increase gait velocity from 2.92 ft/sec without device to >/= 3.7 ft/sec without device for incr. gait efficiency.    Baseline  2.92 ft/sec with no device on 12-25-17    Time  8    Period  Weeks    Status  New    Target Date  02/22/18            Plan - 01/08/18 1622    Clinical Impression Statement  Today's skilled session focused on establishing baseline value for 3 minute walk test and initiating HEP for balance/strengthening. No issues reported with HEP in session today. Pt is progressing toward goals and should benefit from continued PT to progress toward unmet goals.     Rehab Potential  Good    PT Frequency  2x / week    PT Duration  8 weeks    PT Treatment/Interventions  ADLs/Self Care Home Management;Stair training;Gait training;DME Instruction;Therapeutic activities;Therapeutic exercise;Balance training;Neuromuscular re-education;Patient/family education    PT Next Visit Plan  continue to work on balance: compliant surfaces, corner balance, single leg stance; continue to work on LE strengthening as well.     PT Home Exercise Plan  balance HEP and walking program    Consulted and Agree with Plan of Care  Patient;Family member/caregiver    Family Member Consulted  wife [name]       Patient will benefit from skilled therapeutic intervention in order to improve the following deficits and impairments:   Abnormal gait, Cardiopulmonary status limiting activity, Decreased activity tolerance, Decreased balance, Decreased cognition, Decreased coordination, Decreased endurance, Decreased strength  Visit Diagnosis: Hemiplegia and hemiparesis following cerebral infarction affecting right dominant side (HCC)  Muscle weakness (generalized)  Unsteadiness on feet  Other abnormalities of gait and mobility  Other lack of coordination     Problem List Patient Active Problem List   Diagnosis Date Noted  . Moderate aortic stenosis 01/08/2018  . Ascending aorta dilatation (HCC) 01/08/2018  . Mixed hyperlipidemia 01/08/2018  . History of recent stroke 01/08/2018  . Hypoalbuminemia due to protein-calorie malnutrition ([place])   . Essential hypertension   .  Cerebrovascular accident (CVA) due to occlusion of left posterior communicating artery ([place]) 12/10/2017  . Cerebrovascular accident (CVA) due to thrombosis of left posterior cerebral artery ([place])   . Benign essential HTN   . Morbid obesity ([place])   . OSA (obstructive sleep apnea)   . Enteritis due to Clostridium difficile   . Acute CVA (cerebrovascular accident) ([place]) 12/01/2017  . Acute arterial ischemic stroke, multifocal, posterior circulation ([place]) 12/01/2017    Willow Ora, PTA, [facility] [address], [place], [place] [address] [phone] 01/09/18, 9:49 PM   Name: William Lowery MRN: [id number] Date of Birth: 30-Apr-1966

## 2018-01-09 NOTE — Therapy (Signed)
[facility] [address] [place], [place], [address] Phone: [phone]   Fax:  [phone]  Occupational Therapy Treatment  Patient Details  Name: William Lowery MRN: [id number] Date of Birth: 09-May-1966 Referring Provider: Dr. Alger Simons   Encounter Date: 01/09/2018  OT End of Session - 01/09/18 1259    Visit Number  3    Number of Visits  16    Date for OT Re-Evaluation  02/19/18    Authorization Type  self pay    OT Start Time  1146    OT Stop Time  1235    OT Time Calculation (min)  49 min    Activity Tolerance  Patient tolerated treatment well       Past Medical History:  Diagnosis Date  . Asthma    hx of  . Heart murmur    as an infant  . Hypertension   . Sleep apnea    wears CPAP    Past Surgical History:  Procedure Laterality Date  . bellybutton procedure     as a newborn  . IR ANGIO INTRA EXTRACRAN SEL COM CAROTID INNOMINATE BILAT MOD SED  12/01/2017  . IR ANGIO VERTEBRAL SEL VERTEBRAL UNI R MOD SED  12/01/2017  . IR CT HEAD LTD  12/01/2017  . IR INTRA CRAN STENT  12/01/2017  . IR PERCUTANEOUS ART THROMBECTOMY/INFUSION INTRACRANIAL INC DIAG ANGIO  12/01/2017  . RADIOLOGY WITH ANESTHESIA N/A 12/01/2017   Procedure: RADIOLOGY WITH ANESTHESIA CODE STROKE;  Surgeon: Radiologist, Medication, MD;  Location: [facility];  Service: Radiology;  Laterality: N/A;  . TEE WITHOUT CARDIOVERSION N/A 12/06/2017   Procedure: TRANSESOPHAGEAL ECHOCARDIOGRAM (TEE);  Surgeon: Pixie Casino, MD;  [facility] END[facility]COPY;  Service: Cardiovascular;  Laterality: N/A;    There were no vitals filed for this visit.  Subjective Assessment - 01/09/18 1150    Subjective   i prefer to go by William Lowery    Patient is accompained by:  Family member wife    Pertinent History  12/01/2017 L PCA CVA     Patient Stated Goals  I hope to get as much of my right hand working again as I can    Currently in Pain?  No/denies                    OT Treatments/Exercises (OP) - 01/09/18 0001      ADLs   ADL Comments  Addressed dressing and undressing - pt with apraxia and perceptual issues which impact ADL's.  Pt able to complete today with one 2 questioning cues.  Discussed with wife allowing time and stepping out of the room while pt is dressing and undressing.  Pt was even able to tie shoes with extra time - R hand was uncoordinated and clumsy however functionally pt able to tie shoes. Pt needed questioning cues as he did not pull pants all the way up on R side -pt able to identify issue using mirror. Educated pt's wife on use of questioning vs direct cueing and wife verbalized understanding.               OT Education - 01/09/18 1257    Education provided  Yes    Education Details  Dressing and undressing strategies.    Person(s) Educated  Patient;Spouse    Methods  Explanation;Demonstration    Comprehension  Verbalized understanding;Returned demonstration       OT Short Term Goals - 01/09/18 1258  OT SHORT TERM GOAL #1   Title  Pt and wife will be mod I with HEP for coordination, RUE strength and cognition - due 01/22/2018    Status  On-going      OT SHORT TERM GOAL #2   Title  Pt will demonstrate improved gross control of RUE as evidenced by increasing by at least 5 blocks on Box and Blocks to assist with basic ADL's    Status  On-going      OT SHORT TERM GOAL #3   Title  Pt will be mod I with bathing at shower level using AE for back prn    Status  Achieved      OT SHORT TERM GOAL #4   Title  Pt will be mod I with dressing    Status  Achieved      OT SHORT TERM GOAL #5   Title  Pt will be mod I with toilet hygiene    Status  On-going      OT SHORT TERM GOAL #6   Title  Pt will demonstrate inmproved grip strength by at least 5 pounds to assist with basic ADL and IADL tasks (baseline =20 pounds)    Status  On-going      OT SHORT TERM GOAL #7   Title  Pt will use RUE as  stabilizer during ADL and IADL tasks    Status  On-going        OT Long Term Goals - 01/09/18 1258      OT LONG TERM GOAL #1   Title  Pt will be mod I with upgraded HEP for cognition and RUE funcitonal use - 02/19/2018    Status  On-going      OT LONG TERM GOAL #2   Title  Pt will demonstrate improved gross motor control of RUE as evidenced by improving by at least 8 blocks on BOx and blocks to assist with IADL's    Status  On-going      OT LONG TERM GOAL #3   Title  Pt will demonstrate improved grip strength by at least 8 pounds to assist with IADL tasks ( baseline= 20 pounds)    Status  On-going      OT LONG TERM GOAL #4   Title  Pt will demonstrate ability for basic problem solving and organization with familiar functional task.    Status  On-going      OT LONG TERM GOAL #5   Title  Pt will demonstrate ability to write name with 100% legibility    Status  On-going      OT LONG TERM GOAL #6   Title  Pt will demonstrate ability for environmental scanning during physical task with 90% accuracy.    Status  On-going      OT LONG TERM GOAL #7   Title  Pt will demonstrate ability to complete functional task on outdoor uneven surfaces involving basic problem solving and organization.     Status  On-going            Plan - 01/09/18 1258    Clinical Impression Statement  Pt progressing toward goals. Pt with improvement in basic ADL's    Occupational Profile and client history currently impacting functional performance  PMH: morbid obesity, HTN, sleep apnea, small remote R cerebellar CVA, asthma. Pt with loop recorder!!!    Occupational performance deficits (Please refer to evaluation for details):  ADL's;IADL's;Work;Leisure;Social Participation    Rehab Potential  Good  Current Impairments/barriers affecting progress:  apraxia    OT Frequency  2x / week    OT Duration  8 weeks    OT Treatment/Interventions  Self-care/ADL training;DME and/or AE instruction;Neuromuscular  education;Therapeutic exercise;Therapist, nutritional;Therapeutic activities;Cognitive remediation/compensation;Visual/perceptual remediation/compensation;Patient/family education;Balance training    Plan  review goals and POC, initiate HEP for coordination as able, MOCA if not done by ST, futher assess visual scanning, NMR for RUE funtional use including weight bearing and closed chain activities, balance, incorporate cognition     Consulted and Agree with Plan of Care  Patient;Family member/caregiver    Family Member Consulted  wife [name]       Patient will benefit from skilled therapeutic intervention in order to improve the following deficits and impairments:  Abnormal gait, Decreased activity tolerance, Decreased balance, Decreased coordination, Decreased cognition, Decreased knowledge of use of DME, Decreased mobility, Decreased safety awareness, Difficulty walking, Decreased strength, Impaired UE functional use, Impaired sensation, Impaired vision/preception, Obesity  Visit Diagnosis: Hemiplegia and hemiparesis following cerebral infarction affecting right dominant side (HCC)  Muscle weakness (generalized)  Other lack of coordination  Unsteadiness on feet  Other symptoms and signs involving cognitive functions following cerebral infarction  Visuospatial deficit    Problem List Patient Active Problem List   Diagnosis Date Noted  . Moderate aortic stenosis 01/08/2018  . Ascending aorta dilatation (HCC) 01/08/2018  . Mixed hyperlipidemia 01/08/2018  . History of recent stroke 01/08/2018  . Hypoalbuminemia due to protein-calorie malnutrition ([place])   . Essential hypertension   . Cerebrovascular accident (CVA) due to occlusion of left posterior communicating artery ([place]) 12/10/2017  . Cerebrovascular accident (CVA) due to thrombosis of left posterior cerebral artery ([place])   . Benign essential HTN   . Morbid obesity ([place])   . OSA (obstructive sleep apnea)   . Enteritis due  to Clostridium difficile   . Acute CVA (cerebrovascular accident) ([place]) 12/01/2017  . Acute arterial ischemic stroke, multifocal, posterior circulation ([place]) 12/01/2017    Sherryll Burger 01/09/2018, 1:01 PM  [facility] [address] [place], [place], [address] Phone: [phone]   Fax:  [phone]  Name: Zayan Delvecchio MRN: [id number] Date of Birth: 26-May-1966

## 2018-01-11 ENCOUNTER — Ambulatory Visit: Payer: PRIVATE HEALTH INSURANCE

## 2018-01-11 VITALS — BP 136/96 | HR 72

## 2018-01-11 DIAGNOSIS — I69351 Hemiplegia and hemiparesis following cerebral infarction affecting right dominant side: Secondary | ICD-10-CM | POA: Diagnosis not present

## 2018-01-11 DIAGNOSIS — R278 Other lack of coordination: Secondary | ICD-10-CM

## 2018-01-11 DIAGNOSIS — M6281 Muscle weakness (generalized): Secondary | ICD-10-CM

## 2018-01-11 NOTE — Therapy (Signed)
[facility] [address] [place], [place], [address] Phone: [phone]   Fax:  [phone]  Physical Therapy Treatment  Patient Details  Name: William Lowery MRN: [id number] Date of Birth: 10/21/1966 Referring Provider: Dr. Alger Simons   Encounter Date: 01/11/2018  PT End of Session - 01/11/18 1544    Visit Number  3    Number of Visits  17    Date for PT Re-Evaluation  02/22/18    Authorization Type  Self Pay    PT Start Time  3614    PT Stop Time  1400    PT Time Calculation (min)  43 min    Equipment Utilized During Treatment  Gait belt    Activity Tolerance  Patient tolerated treatment well    Behavior During Therapy  [facility] for tasks assessed/performed       Past Medical History:  Diagnosis Date  . Asthma    hx of  . Heart murmur    as an infant  . Hypertension   . Sleep apnea    wears CPAP    Past Surgical History:  Procedure Laterality Date  . bellybutton procedure     as a newborn  . IR ANGIO INTRA EXTRACRAN SEL COM CAROTID INNOMINATE BILAT MOD SED  12/01/2017  . IR ANGIO VERTEBRAL SEL VERTEBRAL UNI R MOD SED  12/01/2017  . IR CT HEAD LTD  12/01/2017  . IR INTRA CRAN STENT  12/01/2017  . IR PERCUTANEOUS ART THROMBECTOMY/INFUSION INTRACRANIAL INC DIAG ANGIO  12/01/2017  . RADIOLOGY WITH ANESTHESIA N/A 12/01/2017   Procedure: RADIOLOGY WITH ANESTHESIA CODE STROKE;  Surgeon: Radiologist, Medication, MD;  Location: [facility];  Service: Radiology;  Laterality: N/A;  . TEE WITHOUT CARDIOVERSION N/A 12/06/2017   Procedure: TRANSESOPHAGEAL ECHOCARDIOGRAM (TEE);  Surgeon: Pixie Casino, MD;  Location: [facility] ENDOSCOPY;  Service: Cardiovascular;  Laterality: N/A;    Vitals:   01/11/18 1336  BP: (!) 136/96  Pulse: 72  Pt denied s/s CVA at rest or during session. Pt states BP has been lower at home, and understands to monitor BP daily based on hx.  Subjective Assessment - 01/11/18 1322    Subjective  Pt reports no  falls, no changes in medications, and no pain to report.     Patient is accompained by:  Family member    Diagnostic tests  transesophageal echocardiogram 1-31-9    Patient Stated Goals  "I want to be able to move regular"    Currently in Pain?  No/denies    Pain Score  0-No pain          [facility] Adult PT Treatment/Exercise - 01/11/18 1412      Transfers   Transfers  Sit to Stand    Number of Reps  -- x 4 reps during sitting rest breaks.       Ambulation/Gait   Ambulation/Gait  Yes    Ambulation/Gait Assistance  4: Min guard;5: Supervision    Ambulation/Gait Assistance Details  Min guard to S to ensure safety.    Ambulation Distance (Feet)  50 Feet x 2 around clinic to perform next activity.    Assistive device  None    Gait Pattern  Ataxic    Ambulation Surface  Level;Indoor      Ankle Exercises: Standing   Heel Raises  Other (comment);10 reps 3 x 5 reps of B heel raises with single leg eccentric lower    Heel Raises Limitations  Pt held on to  parallel bars for B UE support. Performed 3 x 5 reps bilaterally. Pt required initial verbal and demonstration cues for task. This exercise was verbally added to HEP program and told to do bilateral heel raises if needed as a modification.          Balance Exercises - 01/11/18 1415      Balance Exercises: Standing   Standing Eyes Opened  Narrow base of support (BOS);Foam/compliant surface;30 secs;2 reps    Standing Eyes Closed  Narrow base of support (BOS);Wide (BOA);Foam/compliant surface;2 reps;30 secs    Tandem Stance  Eyes open;Foam/compliant surface;2 reps;30 secs    SLS with Vectors  Foam/compliant surface;5 reps;Other reps (comment)    Rockerboard  Anterior/posterior;Lateral;EC;EO;30 seconds;Other reps (comment);Other (comment)      Balance Exercises: Standing   Standing Eyes Opened Limitations  Standing on airex pad at corner, placed chair in front of pt and SPTA held on to gait belt providing min guard to min assistance for  safety during all tasks. EO for 30 sec x 2 reps, EO with narrow BOS for 30 sec x 2, EO with tandem for 30 sec x 2 bilaterally. During tandem, with cues to keep feet flat and even due to tendency of unequal weight distribution. Pt with swaying in various directions and phasic muscle shaking during tasks.     Standing Eyes Closed Time  Standing on airex pad for 30 sex x 2 reps each task, wide stance, narrow BOS,.    Tandem Stance Limitations  Standing on airex pad, performed only with EO. Pt required verbal and demo cues for proper foot placement, upright posture, and use of ankle strategy to correct balance.     SLS with Vectors Limitations  Standing on blue dense foam beam, performed at parallel bars and with min guard assistance from SPTA. Performed SLS with stepping forward 2 x5 reps bilaterally and then SLS with stepping backwards 2 x 5 reps bilaterally.             PT Education - 01/11/18 1438    Education provided  Yes    Education Details  Adding to HEP: Heel raises with single leg eccentric lowering 2 x 5 reps bilaterally.     Methods  Demonstration;Explanation;Verbal cues    Comprehension  Verbalized understanding;Returned demonstration       PT Short Term Goals - 12/26/17 1413      PT SHORT TERM GOAL #1   Title  Increase Berg score from 41/56 to >/= 46/56 to reduce fall risk.    Baseline  41/56    Time  4    Period  Weeks    Status  New    Target Date  01/22/18      PT SHORT TERM GOAL #2   Title  Improve TUG score from 14.6 secs to </= 12.0 secs without device for improved functional mobility.    Baseline  14.6 secs with no device    Time  4    Period  Weeks    Status  New    Target Date  01/22/18      PT SHORT TERM GOAL #3   Title  Increase distance in 3" walk test by at least 100' with use of RW to demo improved endurance/activity tolerance.    Baseline  To be assessed next session    Time  4    Period  Weeks    Status  New    Target Date  01/22/18  PT  SHORT TERM GOAL #4   Title  Negotiate steps (4) with no rail using a step by step sequence with SBA.    Time  4    Period  Weeks    Status  New    Target Date  01/22/18      PT SHORT TERM GOAL #5   Title  Independent in HEP for balance and RLE strengthening.    Time  4    Period  Weeks    Status  New    Target Date  01/22/18        PT Long Term Goals - 12/26/17 1418      PT LONG TERM GOAL #1   Title  Increase Berg score from 41/56 to >/= 51/56 to decr. fall risk.    Baseline  41/56 on 12-25-17    Time  8    Period  Weeks    Status  New    Target Date  02/22/18      PT LONG TERM GOAL #2   Title  Pt will amb. 1000' without device modified independently on all surfaces.     Baseline  pt amb. with RW 100' with SBA    Time  8    Period  Weeks    Status  New    Target Date  02/22/18      PT LONG TERM GOAL #3   Title  Negotiate 4 steps without rail using a step over step sequence.    Time  8    Period  Weeks    Status  New    Target Date  02/22/18      PT LONG TERM GOAL #4   Title  Independent in updated HEP, including community fitness options for continued HEP.    Time  8    Period  Weeks    Status  New    Target Date  02/22/18      PT LONG TERM GOAL #5   Title  Increase gait velocity from 2.92 ft/sec without device to >/= 3.7 ft/sec without device for incr. gait efficiency.    Baseline  2.92 ft/sec with no device on 12-25-17    Time  8    Period  Weeks    Status  New    Target Date  02/22/18            Plan - 01/11/18 1546    Clinical Impression Statement  Today's skilled session focused on improving static balance on uneven surfaces, dynamic balance with a steppage strategy, and bilateral lower extremity strengthening. Pt continues to improve towards STG's and verbally stated HEP compliance. Pt would benefit from skilled PT interventions towards unmet STG's.      History and Personal Factors relevant to plan of care:  pt is self pay; has supportive spouse  William Lowery); was self employed- owned [employer]; loop recorder implant during hospitalization     Clinical Presentation  Evolving    Clinical Presentation due to:  L PCA CVA - multi infarct     Clinical Decision Making  Moderate    Rehab Potential  Good    PT Frequency  2x / week    PT Duration  8 weeks    PT Treatment/Interventions  ADLs/Self Care Home Management;Stair training;Gait training;DME Instruction;Therapeutic activities;Therapeutic exercise;Balance training;Neuromuscular re-education;Patient/family education    PT Next Visit Plan  Continue to progress static and dynamic balance: on compliant surfaces, corner balance, rockerboard, single leg stance; LE strengthening.  PT Home Exercise Plan  balance HEP and walking program    Recommended Other Services  pt is receiving OT    Consulted and Agree with Plan of Care  Patient;Family member/caregiver    Family Member Consulted  wife William Lowery       Patient will benefit from skilled therapeutic intervention in order to improve the following deficits and impairments:  Abnormal gait, Cardiopulmonary status limiting activity, Decreased activity tolerance, Decreased balance, Decreased cognition, Decreased coordination, Decreased endurance, Decreased strength  Visit Diagnosis: Hemiplegia and hemiparesis following cerebral infarction affecting right dominant side (HCC)  Muscle weakness (generalized)  Other lack of coordination     Problem List Patient Active Problem List   Diagnosis Date Noted  . Moderate aortic stenosis 01/08/2018  . Ascending aorta dilatation (HCC) 01/08/2018  . Mixed hyperlipidemia 01/08/2018  . History of recent stroke 01/08/2018  . Hypoalbuminemia due to protein-calorie malnutrition ([place])   . Essential hypertension   . Cerebrovascular accident (CVA) due to occlusion of left posterior communicating artery ([place]) 12/10/2017  . Cerebrovascular accident (CVA) due to thrombosis of left posterior cerebral  artery ([place])   . Benign essential HTN   . Morbid obesity ([place])   . OSA (obstructive sleep apnea)   . Enteritis due to Clostridium difficile   . Acute CVA (cerebrovascular accident) ([place]) 12/01/2017  . Acute arterial ischemic stroke, multifocal, posterior circulation ([place]) 12/01/2017    Carlena Sax 01/11/2018, 3:55 PM  [facility] [address] [place], [place], [address] Phone: [phone]   Fax:  [phone]  Name: William Lowery MRN: [id number] Date of Birth: 1966-11-01

## 2018-01-14 ENCOUNTER — Ambulatory Visit: Payer: Self-pay | Admitting: Physical Therapy

## 2018-01-14 ENCOUNTER — Encounter: Payer: Self-pay | Admitting: Occupational Therapy

## 2018-01-15 ENCOUNTER — Encounter: Payer: Self-pay | Admitting: Occupational Therapy

## 2018-01-15 ENCOUNTER — Ambulatory Visit: Payer: Self-pay | Admitting: Physical Therapy

## 2018-01-18 ENCOUNTER — Ambulatory Visit: Payer: PRIVATE HEALTH INSURANCE

## 2018-01-18 DIAGNOSIS — I69351 Hemiplegia and hemiparesis following cerebral infarction affecting right dominant side: Secondary | ICD-10-CM | POA: Diagnosis not present

## 2018-01-18 DIAGNOSIS — R41841 Cognitive communication deficit: Secondary | ICD-10-CM

## 2018-01-18 DIAGNOSIS — R4701 Aphasia: Secondary | ICD-10-CM

## 2018-01-18 NOTE — Therapy (Signed)
[facility] [address] [place], [place], [address] Phone: [phone]   Fax:  [phone]  Speech Language Pathology Treatment  Patient Details  Name: William Lowery MRN: [id number] Date of Birth: 1966-03-16 Referring Provider: Dr. Alger Simons   Encounter Date: 01/18/2018  End of Session - 01/18/18 1052    Visit Number  2    Number of Visits  17    Date for SLP Re-Evaluation  03/01/18    SLP Start Time  0807 pt 7 min late    SLP Stop Time   0845    SLP Time Calculation (min)  38 min    Activity Tolerance  Patient tolerated treatment well       Past Medical History:  Diagnosis Date  . Asthma    hx of  . Heart murmur    as an infant  . Hypertension   . Sleep apnea    wears CPAP    Past Surgical History:  Procedure Laterality Date  . bellybutton procedure     as a newborn  . IR ANGIO INTRA EXTRACRAN SEL COM CAROTID INNOMINATE BILAT MOD SED  12/01/2017  . IR ANGIO VERTEBRAL SEL VERTEBRAL UNI R MOD SED  12/01/2017  . IR CT HEAD LTD  12/01/2017  . IR INTRA CRAN STENT  12/01/2017  . IR PERCUTANEOUS ART THROMBECTOMY/INFUSION INTRACRANIAL INC DIAG ANGIO  12/01/2017  . RADIOLOGY WITH ANESTHESIA N/A 12/01/2017   Procedure: RADIOLOGY WITH ANESTHESIA CODE STROKE;  Surgeon: Radiologist, Medication, MD;  Location: [facility];  Service: Radiology;  Laterality: N/A;  . TEE WITHOUT CARDIOVERSION N/A 12/06/2017   Procedure: TRANSESOPHAGEAL ECHOCARDIOGRAM (TEE);  Surgeon: Pixie Casino, MD;  Location: [facility] ENDOSCOPY;  Service: Cardiovascular;  Laterality: N/A;    There were no vitals filed for this visit.  Subjective Assessment - 01/18/18 0811    Subjective  "Good to meet you William Lowery."    Patient is accompained by:  Family member Wife, [name]    Currently in Pain?  Yes            ADULT SLP TREATMENT - 01/18/18 0817      General Information   Behavior/Cognition  Alert;Pleasant mood;Cooperative      Treatment Provided   Treatment provided  Cognitive-Linquistic      Cognitive-Linquistic Treatment   Treatment focused on  Aphasia;Patient/family/caregiver education    Skilled Treatment  (speech tx- individual) Pt reading comprehension assessed in simple 8-9 sentence paragraphs. Pt demo'd good comprehension - reading for short selections is considered WNL. SLP assessed pt writing by functional means. Pt made errors with numbers in his address, but noted errors. (family education 12 minutes) SLP educated pt/wife about personality changes after CVA; personality changes are WNL. Education about attention/concentration and how it related to memory. Education re: reading and tasks to do with reading at home.       Assessment / Recommendations / Plan   Plan  Continue with current plan of care      Progression Toward Goals   Progression toward goals  Progressing toward goals       SLP Education - 01/18/18 1051    Education provided  Yes    Education Details  personality changes after CVA, cognitive comm changes due to CVA, attention and relationship to memory    Person(s) Educated  Patient;Spouse    Methods  Explanation;Demonstration    Comprehension  Verbalized understanding;Need further instruction       SLP Short Term Goals - 01/18/18  [name] #1   Title  pt will use compensations for anomia in 10 minutes mod complex conversation     Time  4    Period  Weeks    Status  On-going      SLP SHORT TERM GOAL #2   Title  pt will name 8 items in mod abstract/complex category with rare min A     Time  4    Period  Weeks    Status  On-going      SLP SHORT TERM GOAL #3   Title  pt will participate in further assessment of language (reading, writing) and cognition and goals added PRN    Time  2    Period  Weeks    Status  On-going       SLP Long Term Goals - 01/18/18 1055      SLP LONG TERM GOAL #1   Title  pt will demo functional verbal output in 10 minutes complex conversation over  three sessions    Time  8    Period  Weeks    Status  On-going      SLP LONG TERM GOAL #2   Title  in 15 minutes mod complex/complex conversation pt will ask questions or comment appropriately to demo understanding, over 3 sessions    Time  8    Period  Weeks    Status  On-going       Plan - 01/18/18 1052    Clinical Impression Statement  Pt presents today with attention deficits/impulsivity, with reported expressive language deficits. SLP assessed pt's reading comprehension in simple 8-9 sentence paragraph which appeared WNL. SLP feels where pt reading comprehension may decr is due to cog-comm deficits. Pt wrote his name and address with self-corrected house number. Skilled ST remains necessary for pt's improvement in cognitive-communication skills as wellas language skills. Cognitive testing to be completed in next 1-2 visits.    Speech Therapy Frequency  2x / week    Duration  -- 8 weeks or 16 additional visits    Treatment/Interventions  Cognitive reorganization;Multimodal communcation approach;Compensatory strategies;Language facilitation;Cueing hierarchy;Internal/external aids;Functional tasks;SLP instruction and feedback;Patient/family education    Potential to Achieve Goals  Good       Patient will benefit from skilled therapeutic intervention in order to improve the following deficits and impairments:   Aphasia  Cognitive communication deficit    Problem List Patient Active Problem List   Diagnosis Date Noted  . Moderate aortic stenosis 01/08/2018  . Ascending aorta dilatation (HCC) 01/08/2018  . Mixed hyperlipidemia 01/08/2018  . History of recent stroke 01/08/2018  . Hypoalbuminemia due to protein-calorie malnutrition ([place])   . Essential hypertension   . Cerebrovascular accident (CVA) due to occlusion of left posterior communicating artery ([place]) 12/10/2017  . Cerebrovascular accident (CVA) due to thrombosis of left posterior cerebral artery ([place])   . Benign  essential HTN   . Morbid obesity ([place])   . OSA (obstructive sleep apnea)   . Enteritis due to Clostridium difficile   . Acute CVA (cerebrovascular accident) ([place]) 12/01/2017  . Acute arterial ischemic stroke, multifocal, posterior circulation ([place]) 12/01/2017    [facility], [place]  01/18/2018, 10:56 AM  [facility] [address] [place], [place], [address] Phone: [phone]   Fax:  [phone]   Name: Elmo Rio MRN: [id number] Date of Birth: December 17, 1965

## 2018-01-22 ENCOUNTER — Ambulatory Visit: Payer: PRIVATE HEALTH INSURANCE | Admitting: Physical Therapy

## 2018-01-22 ENCOUNTER — Encounter: Payer: Self-pay | Admitting: Occupational Therapy

## 2018-01-22 ENCOUNTER — Ambulatory Visit: Payer: PRIVATE HEALTH INSURANCE | Admitting: Occupational Therapy

## 2018-01-22 DIAGNOSIS — R2681 Unsteadiness on feet: Secondary | ICD-10-CM

## 2018-01-22 DIAGNOSIS — I69351 Hemiplegia and hemiparesis following cerebral infarction affecting right dominant side: Secondary | ICD-10-CM | POA: Diagnosis not present

## 2018-01-22 DIAGNOSIS — R278 Other lack of coordination: Secondary | ICD-10-CM

## 2018-01-22 DIAGNOSIS — I69318 Other symptoms and signs involving cognitive functions following cerebral infarction: Secondary | ICD-10-CM

## 2018-01-22 DIAGNOSIS — R2689 Other abnormalities of gait and mobility: Secondary | ICD-10-CM

## 2018-01-22 DIAGNOSIS — R41842 Visuospatial deficit: Secondary | ICD-10-CM

## 2018-01-22 DIAGNOSIS — M6281 Muscle weakness (generalized): Secondary | ICD-10-CM

## 2018-01-22 NOTE — Therapy (Signed)
[facility] [address] [place], [place], [address] Phone: [phone]   Fax:  [phone]  Occupational Therapy Treatment  Patient Details  Name: William Lowery MRN: [id number] Date of Birth: 11-02-1966 Referring Provider: Dr. Alger Simons   Encounter Date: 01/22/2018  OT End of Session - 01/22/18 1305    Visit Number  4    Number of Visits  16    Date for OT Re-Evaluation  02/19/18    Authorization Type  self pay    OT Start Time  0932    OT Stop Time  1015    OT Time Calculation (min)  43 min    Activity Tolerance  Patient tolerated treatment well       Past Medical History:  Diagnosis Date  . Asthma    hx of  . Heart murmur    as an infant  . Hypertension   . Sleep apnea    wears CPAP    Past Surgical History:  Procedure Laterality Date  . bellybutton procedure     as a newborn  . IR ANGIO INTRA EXTRACRAN SEL COM CAROTID INNOMINATE BILAT MOD SED  12/01/2017  . IR ANGIO VERTEBRAL SEL VERTEBRAL UNI R MOD SED  12/01/2017  . IR CT HEAD LTD  12/01/2017  . IR INTRA CRAN STENT  12/01/2017  . IR PERCUTANEOUS ART THROMBECTOMY/INFUSION INTRACRANIAL INC DIAG ANGIO  12/01/2017  . RADIOLOGY WITH ANESTHESIA N/A 12/01/2017   Procedure: RADIOLOGY WITH ANESTHESIA CODE STROKE;  Surgeon: Radiologist, Medication, MD;  Location: [facility];  Service: Radiology;  Laterality: N/A;  . TEE WITHOUT CARDIOVERSION N/A 12/06/2017   Procedure: TRANSESOPHAGEAL ECHOCARDIOGRAM (TEE);  Surgeon: Pixie Casino, MD;  Location: [facility] ENDOSCOPY;  Service: Cardiovascular;  Laterality: N/A;    There were no vitals filed for this visit.  Subjective Assessment - 01/22/18 0936    Subjective   I don't know if I am doing any better (pt tearful this session)    Patient is accompained by:  Family member wife    Pertinent History  12/01/2017 L PCA CVA     Patient Stated Goals  I hope to get as much of my right hand working again as I can    Currently in  Pain?  No/denies                   OT Treatments/Exercises (OP) - 01/22/18 0001      ADLs   Toileting  Pt now mod I with toilet hygiene per pt and wife.  Pt also report he is now dressing with occassional help only to tighten string on shorts- encouraged pt to try and do on his own as pt is currently able to tie his own shoes - encouraged wife to allow pt to do this for greater independence. Both verbalized understanding.      Bathing  Pt's wife reports she is washing pt's feet because pt does not wish to sit in shower and "the shower is too small for him to bend over safely."  Worked on strategies for pt to safely wash own feet with wife just providing distant supevision.  Pt able to return demonstrate and wife verbalized understanding.      Writing  Addressed pre writing skills for tracing shapes with marker and coban wrapping.  Also attempted printing just first initial of first name - pt with improved ability to pick up pen with R hand  - legibility approximately 50% for single letter.  ADL Comments  Pt tearful today and expressing that he isn't sure he is making gains.  Due to this reassessed STG's to allow pt concrete feedback on progress - see goal section. Pt able to verbalize understanding.               OT Education - 01/22/18 1302    Education provided  Yes    Education Details  prewriitng skills    Person(s) Educated  Patient;Spouse    Methods  Explanation;Demonstration;Handout    Comprehension  Verbalized understanding;Returned demonstration       OT Short Term Goals - 01/22/18 1303      OT SHORT TERM GOAL #1   Title  Pt and wife will be mod I with HEP for coordination, RUE strength and cognition - due 01/22/2018    Status  Achieved      OT SHORT TERM GOAL #2   Title  Pt will demonstrate improved gross control of RUE as evidenced by increasing by at least 5 blocks on Box and Blocks to assist with basic ADL's    Status  Achieved 01/22/2018  23 blocks       OT SHORT TERM GOAL #3   Title  Pt will be mod I with bathing at shower level using AE for back prn    Status  Partially Met wife still wishes to provide distant supevision      OT SHORT TERM GOAL #4   Title  Pt will be mod I with dressing    Status  Achieved      OT SHORT TERM GOAL #5   Title  Pt will be mod I with toilet hygiene    Status  Achieved      OT SHORT TERM GOAL #6   Title  Pt will demonstrate inmproved grip strength by at least 5 pounds to assist with basic ADL and IADL tasks (baseline =20 pounds)    Status  Achieved 01/22/2018  32 pounds      OT SHORT TERM GOAL #7   Title  Pt will use RUE as stabilizer during ADL and IADL tasks    Status  Achieved        OT Long Term Goals - 01/22/18 1304      OT LONG TERM GOAL #1   Title  Pt will be mod I with upgraded HEP for cognition and RUE funcitonal use - 02/19/2018    Status  On-going      OT LONG TERM GOAL #2   Title  Pt will demonstrate improved gross motor control of RUE as evidenced by improving by at least 20 blocks on BOx and blocks to assist with IADL's    Status  Revised      OT LONG TERM GOAL #3   Title  Pt will demonstrate improved grip strength by at least  20 pounds to assist with IADL tasks ( baseline= 20 pounds)    Status  Revised      OT LONG TERM GOAL #4   Title  Pt will demonstrate ability for basic problem solving and organization with familiar functional task.    Status  On-going      OT LONG TERM GOAL #5   Title  Pt will demonstrate ability to write name with 100% legibility    Status  On-going      OT LONG TERM GOAL #6   Title  Pt will demonstrate ability for environmental scanning during physical task with 90% accuracy.  Status  On-going      OT LONG TERM GOAL #7   Title  Pt will demonstrate ability to complete functional task on outdoor uneven surfaces involving basic problem solving and organization.     Status  On-going            Plan - 01/22/18 1304    Clinical Impression  Statement  Pt has met all STG's. Pt progressing toward LTG's.    Occupational Profile and client history currently impacting functional performance  PMH: morbid obesity, HTN, sleep apnea, small remote R cerebellar CVA, asthma. Pt with loop recorder!!!    Occupational performance deficits (Please refer to evaluation for details):  ADL's;IADL's;Work;Leisure;Social Participation    Rehab Potential  Good    Current Impairments/barriers affecting progress:  apraxia    OT Frequency  2x / week    OT Duration  8 weeks    OT Treatment/Interventions  Self-care/ADL training;DME and/or AE instruction;Neuromuscular education;Therapeutic exercise;Therapist, nutritional;Therapeutic activities;Cognitive remediation/compensation;Visual/perceptual remediation/compensation;Patient/family education;Balance training    Plan  review goals and POC, initiate HEP for coordination as able, MOCA if not done by ST, futher assess visual scanning, NMR for RUE funtional use including weight bearing and closed chain activities, balance, incorporate cognition     Consulted and Agree with Plan of Care  Patient;Family member/caregiver    Family Member Consulted  wife [name]       Patient will benefit from skilled therapeutic intervention in order to improve the following deficits and impairments:  Abnormal gait, Decreased activity tolerance, Decreased balance, Decreased coordination, Decreased cognition, Decreased knowledge of use of DME, Decreased mobility, Decreased safety awareness, Difficulty walking, Decreased strength, Impaired UE functional use, Impaired sensation, Impaired vision/preception, Obesity  Visit Diagnosis: Hemiplegia and hemiparesis following cerebral infarction affecting right dominant side (HCC)  Muscle weakness (generalized)  Other lack of coordination  Unsteadiness on feet  Other symptoms and signs involving cognitive functions following cerebral infarction  Visuospatial deficit    Problem  List Patient Active Problem List   Diagnosis Date Noted  . Moderate aortic stenosis 01/08/2018  . Ascending aorta dilatation (HCC) 01/08/2018  . Mixed hyperlipidemia 01/08/2018  . History of recent stroke 01/08/2018  . Hypoalbuminemia due to protein-calorie malnutrition ([facility])   . Essential hypertension   . Cerebrovascular accident (CVA) due to occlusion of left posterior communicating artery ([facility]) 12/10/2017  . Cerebrovascular accident (CVA) due to thrombosis of left posterior cerebral artery ([facility])   . Benign essential HTN   . Morbid obesity ([facility])   . OSA (obstructive sleep apnea)   . Enteritis due to Clostridium difficile   . Acute CVA (cerebrovascular accident) ([place]) 12/01/2017  . Acute arterial ischemic stroke, multifocal, posterior circulation ([place]) 12/01/2017    Quay Burow , OTR/L 01/22/2018, 1:07 PM  [facility] [address] [place], [place], [address] Phone: [phone]   Fax:  [phone]  Name: Demorio Seeley MRN: [id number] Date of Birth: 01-28-1966

## 2018-01-23 ENCOUNTER — Encounter: Payer: Self-pay | Admitting: Physical Therapy

## 2018-01-23 ENCOUNTER — Encounter: Payer: Self-pay | Admitting: Physical Medicine & Rehabilitation

## 2018-01-23 ENCOUNTER — Encounter: Payer: PRIVATE HEALTH INSURANCE | Attending: Registered Nurse | Admitting: Physical Medicine & Rehabilitation

## 2018-01-23 VITALS — BP 119/85 | HR 71

## 2018-01-23 DIAGNOSIS — I6930 Unspecified sequelae of cerebral infarction: Secondary | ICD-10-CM | POA: Diagnosis present

## 2018-01-23 DIAGNOSIS — G4730 Sleep apnea, unspecified: Secondary | ICD-10-CM | POA: Insufficient documentation

## 2018-01-23 DIAGNOSIS — I10 Essential (primary) hypertension: Secondary | ICD-10-CM

## 2018-01-23 DIAGNOSIS — Z86718 Personal history of other venous thrombosis and embolism: Secondary | ICD-10-CM | POA: Insufficient documentation

## 2018-01-23 DIAGNOSIS — I63332 Cerebral infarction due to thrombosis of left posterior cerebral artery: Secondary | ICD-10-CM

## 2018-01-23 NOTE — Progress Notes (Signed)
Subjective:    Patient ID: William Lowery, male    DOB: [birth year], 52 y.o.   MRN: [id number]  HPI   William Lowery is here in follow up of his PCA infarct which involved his left occipital lobe, thalamus, bilateral cerebellar. He left rehab last month. He has been working diligently in [employer] rehab at the [facility] center.  He is still working on his right-sided coordination and processing issues. He does better cognitively with established, familiar circumstances but struggles with new,unfamiliar situations.  He and wife report that he has been occasionally tearful at times.  He denies being depressed but does admit to struggling with his deficits and at what at times appears to be lack of progress.  His wife feels that he is actually depressed.  There is definite anxiety related to his deficits and apparently there are some things going on at home which are increasing anxiety levels as well.  He does tell me that he is sleeping well but does end up taking a nap at times for an hour or 2 during the middle of the day.  He does report ongoing tremor in the right hand.  Sometimes the tremor can be worse at night or with intentional movements.  There is no associated pain.  Visually he does feel at times it is difficult to attend to the right side.  He was watching something on television the other day and struggled with the objects in the right upper quadrant.   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  He has intentions of getting back to this but realizes he is not ready for this yet.  He also has some insight to the fact that he is not ready to be driving at either.   Pain Inventory Average Pain 0 Pain Right Now 0 My pain is na  In the last 24 hours, has pain interfered with the following? General activity 0 Relation with others 0 Enjoyment of life 0 What TIME of day is your pain at its worst? na Sleep (in general) Good  Pain is worse with: na Pain improves with:  na Relief from Meds: na  Mobility walk without assistance ability to climb steps?  yes do you drive?  no  Function not employed: date last employed .  Neuro/Psych weakness numbness tremor tingling trouble walking  Prior Studies Any changes since last visit?  no  Physicians involved in your care Any changes since last visit?  no   Family History  Problem Relation Age of Onset  . COPD Mother   . Colon cancer Neg Hx   . Esophageal cancer Neg Hx   . Rectal cancer Neg Hx   . Stomach cancer Neg Hx    Social History   Socioeconomic History  . Marital status: Married    Spouse name: Not on file  . Number of children: Not on file  . Years of education: Not on file  . Highest education level: Not on file  Social Needs  . Financial resource strain: Not on file  . Food insecurity - worry: Not on file  . Food insecurity - inability: Not on file  . Transportation needs - medical: Not on file  . Transportation needs - non-medical: Not on file  Occupational History  . Not on file  Tobacco Use  . Smoking status: Never Smoker  . Smokeless tobacco: Former Systems developer    Types: Snuff  Substance and Sexual Activity  . Alcohol use: No  Alcohol/week: 0.0 oz    Comment: socially  . Drug use: No  . Sexual activity: Not on file  Other Topics Concern  . Not on file  Social History Narrative  . Not on file   Past Surgical History:  Procedure Laterality Date  . bellybutton procedure     as a newborn  . IR ANGIO INTRA EXTRACRAN SEL COM CAROTID INNOMINATE BILAT MOD SED  12/01/2017  . IR ANGIO VERTEBRAL SEL VERTEBRAL UNI R MOD SED  12/01/2017  . IR CT HEAD LTD  12/01/2017  . IR INTRA CRAN STENT  12/01/2017  . IR PERCUTANEOUS ART THROMBECTOMY/INFUSION INTRACRANIAL INC DIAG ANGIO  12/01/2017  . RADIOLOGY WITH ANESTHESIA N/A 12/01/2017   Procedure: RADIOLOGY WITH ANESTHESIA CODE STROKE;  Surgeon: Radiologist, Medication, MD;  Location: [facility];  Service: Radiology;  Laterality: N/A;  . TEE  WITHOUT CARDIOVERSION N/A 12/06/2017   Procedure: TRANSESOPHAGEAL ECHOCARDIOGRAM (TEE);  Surgeon: Pixie Casino, MD;  Location: [facility] ENDOSCOPY;  Service: Cardiovascular;  Laterality: N/A;   Past Medical History:  Diagnosis Date  . Asthma    hx of  . Heart murmur    as an infant  . Hypertension   . Sleep apnea    wears CPAP   There were no vitals taken for this visit.  Opioid Risk Score:   Fall Risk Score:  `1  Depression screen PHQ 2/9  Depression screen [facility] 2/9 09/07/2016 11/05/2015  Decreased Interest 0 0  Down, Depressed, Hopeless 0 0  PHQ - 2 Score 0 0     Review of Systems  Constitutional: Negative.   HENT: Negative.   Eyes: Negative.   Respiratory: Negative.   Cardiovascular: Negative.   Gastrointestinal: Negative.   Endocrine: Negative.   Genitourinary: Negative.   Musculoskeletal: Negative.   Skin: Negative.   Allergic/Immunologic: Negative.   Neurological: Negative.   Hematological: Negative.   Psychiatric/Behavioral: Negative.   All other systems reviewed and are negative.      Objective:   Physical Exam General: No acute distress long well-kept beard.  HEENT: EOMI, oral membranes moist Cards: reg rate  Chest: normal effort Abdomen: Soft, NT, ND Skin: dry, intact Extremities: no edema Musculoskeletal: No peripheral edema Neurological: He is alert.       Motor: RUE 4/5.  Right lower extremity 4 to 4+ out of 5.  Patient with decreased fine motor coordination of the right arm during finger to nose testing.  May have mild intentional tremor in addition to the ataxia.  When he ambulates he does fairly well but tends to struggle a bit with placement of his foot.  He did not appear at risk for falling and was not overly impulsive.  Sensation was intact to light touch and pain in all 4 limbs.  Performed field testing and saw no gross deficits in his right upper or lower quadrants despite testing in multiple ways today.  Cognitively he displays improved  awareness and insight.  When talking about his business and things that he is familiar with, he did very well and seem to be fluent.  When discussing some other matters outside of his work he seemed to struggle at times for thoughts and to process information Skin: Skin is warm and dry.   Vascular changes b/l LE Psychiatric: Generally pleasant and appropriate         Assessment & Plan:  1. Gait disorder with visual disturbance secondary to left PCA infarction.PCA occlusion status post thrombectomy and placement of loop recorder    -  continue with outpt therapies.            -Reviewed the fact with him that this is going to be a extended process.  He has ongoing higher level cognitive deficits as well as issues with his balance and coordination.  2.  Right-sided tremor.  Continue to work on coordination and strengthening with therapy.  Consider pharmaceutical management depending upon progress 3. Mood:  Reactive depression with anxiety.  Consider trial of Lexapro 5-10 mg at night.  I tend to favor that his mood is more related to anxiety and depression rather than lability associated with the stroke itself given the location of his acute stroke..  If we do not see improvement in his behavior over the next few weeks and we can pursue treatment.  I asked his wife or the patient himself to call me. 7.  Hx of c diff, completed vancomycin.    -continue probiotic 8.History of hypertension. Cozaar 50 mg daily             -per primary 9. Morbid obesity. Diet an ongoing  10. OSA. continueCPAP at home   Thirty minutes of face to face patient care time were spent during this visit. All questions were encouraged and answered. Greater than 50% of time during this encounter was spent counseling patient/family in regard to stroke recovery, anatomy. Marland Kitchen

## 2018-01-23 NOTE — Therapy (Signed)
[facility] [address] [place], [place], [address] Phone: [phone]   Fax:  [phone]  Physical Therapy Treatment  Patient Details  Name: William Lowery MRN: [id number] Date of Birth: [birth year] Referring Provider: Dr. Alger Simons   Encounter Date: 01/22/2018  PT End of Session - 01/23/18 1353    Visit Number  4    Number of Visits  17    Authorization Type  Self Pay    PT Start Time  8563    PT Stop Time  1102    PT Time Calculation (min)  44 min    Equipment Utilized During Treatment  Gait belt       Past Medical History:  Diagnosis Date  . Asthma    hx of  . Heart murmur    as an infant  . Hypertension   . Sleep apnea    wears CPAP    Past Surgical History:  Procedure Laterality Date  . bellybutton procedure     as a newborn  . IR ANGIO INTRA EXTRACRAN SEL COM CAROTID INNOMINATE BILAT MOD SED  12/01/2017  . IR ANGIO VERTEBRAL SEL VERTEBRAL UNI R MOD SED  12/01/2017  . IR CT HEAD LTD  12/01/2017  . IR INTRA CRAN STENT  12/01/2017  . IR PERCUTANEOUS ART THROMBECTOMY/INFUSION INTRACRANIAL INC DIAG ANGIO  12/01/2017  . RADIOLOGY WITH ANESTHESIA N/A 12/01/2017   Procedure: RADIOLOGY WITH ANESTHESIA CODE STROKE;  Surgeon: Radiologist, Medication, MD;  Location: [facility];  Service: Radiology;  Laterality: N/A;  . TEE WITHOUT CARDIOVERSION N/A 12/06/2017   Procedure: TRANSESOPHAGEAL ECHOCARDIOGRAM (TEE);  Surgeon: Pixie Casino, MD;  Location: [facility] ENDOSCOPY;  Service: Cardiovascular;  Laterality: N/A;    There were no vitals filed for this visit.  Subjective Assessment - 01/23/18 1342    Subjective  No changes reported; wife states they cancelled appts last week because their grandson was born    Patient is accompained by:  Family member wife [name]    Diagnostic tests  transesophageal echocardiogram 1-31-9    Patient Stated Goals  "I want to be able to move regular"    Currently in Pain?  No/denies                       [facility] Adult PT Treatment/Exercise - 01/23/18 0001      Transfers   Transfers  Sit to Stand    Number of Reps  10 reps    Comments  both feet on blue balance beam for increased challenge with balance with sit to stand       Ambulation/Gait   Ambulation/Gait  Yes    Ambulation/Gait Assistance  5: Supervision;4: Min guard    Ambulation/Gait Assistance Details  pt tossing ball to increase dynamic standing balance    Ambulation Distance (Feet)  200 Feet    Assistive device  None    Gait Pattern  Ataxic    Ambulation Surface  Level;Indoor    Gait Comments  No major LOB with walking/tossing ball      High Level Balance   High Level Balance Activities  Side stepping;Braiding;Backward walking;Tandem walking exercises performed inside // bars to have UE support prn       Neuro Re-ed    Neuro Re-ed Details   Pt performed coordination exercise with RLE - touching top of 6" step in various places; pt then performed same exercise with LLE to improve SLS on RLE; pt needed intermittent  UE support on //bar for safety          Balance Exercises - 01/23/18 1351      Balance Exercises: Standing   Tandem Stance  Eyes open;Foam/compliant surface;2 reps;10 secs    SLS  Eyes open;2 reps;10 secs;Limitations more difficult on RLE than on LLE    Rockerboard  Anterior/posterior;EO;10 reps;UE support;Intermittent UE support standing on board - head turns side to side & up/down     Pt performed amb. 2 reps forward/backward on tiptoes inside // bars with UE support prn with SBA for improved Dynamic balance and for Rt gastroc strengthening  Pt performed heel raises bil. LE's 10 reps:  RLE only 10 reps with minimal UE support on // bar      PT Short Term Goals - 01/23/18 1400      PT SHORT TERM GOAL #1   Title  Increase Berg score from 41/56 to >/= 46/56 to reduce fall risk.    Baseline  41/56    Time  4    Period  Weeks    Status  New    Target Date  01/29/18  extend 1 week out due to pt not attending PT week of 01-14-18      PT SHORT TERM GOAL #2   Title  Improve TUG score from 14.6 secs to </= 12.0 secs without device for improved functional mobility.    Baseline  14.6 secs with no device    Time  4    Period  Weeks    Status  New    Target Date  01/29/18      PT SHORT TERM GOAL #3   Title  Increase distance in 3" walk test by at least 100' with use of RW to demo improved endurance/activity tolerance.    Baseline  To be assessed next session    Target Date  01/29/18      PT SHORT TERM GOAL #4   Title  Negotiate steps (4) with no rail using a step by step sequence with SBA.    Time  4    Period  Weeks    Status  New    Target Date  01/29/18      PT SHORT TERM GOAL #5   Title  Independent in HEP for balance and RLE strengthening.    Time  4    Period  Weeks    Status  New    Target Date  01/29/18        PT Long Term Goals - 12/26/17 1418      PT LONG TERM GOAL #1   Title  Increase Berg score from 41/56 to >/= 51/56 to decr. fall risk.    Baseline  41/56 on 12-25-17    Time  8    Period  Weeks    Status  New    Target Date  02/22/18      PT LONG TERM GOAL #2   Title  Pt will amb. 1000' without device modified independently on all surfaces.     Baseline  pt amb. with RW 100' with SBA    Time  8    Period  Weeks    Status  New    Target Date  02/22/18      PT LONG TERM GOAL #3   Title  Negotiate 4 steps without rail using a step over step sequence.    Time  8    Period  Weeks  Status  New    Target Date  02/22/18      PT LONG TERM GOAL #4   Title  Independent in updated HEP, including community fitness options for continued HEP.    Time  8    Period  Weeks    Status  New    Target Date  02/22/18      PT LONG TERM GOAL #5   Title  Increase gait velocity from 2.92 ft/sec without device to >/= 3.7 ft/sec without device for incr. gait efficiency.    Baseline  2.92 ft/sec with no device on 12-25-17    Time  8     Period  Weeks    Status  New    Target Date  02/22/18            Plan - 01/23/18 1354    Clinical Impression Statement  Pt is improving with balance and gait with gait pattern noted to be less ataxic than that demonstrated in previous weeks.  Pt continues to have decreased SLS on RLE compared to LLE but is progressing well towards goals.  Rt plantarflexors remain slightly weaker than Lt plantarflexors.      Rehab Potential  Good    PT Frequency  2x / week    PT Duration  8 weeks    PT Treatment/Interventions  ADLs/Self Care Home Management;Stair training;Gait training;DME Instruction;Therapeutic activities;Therapeutic exercise;Balance training;Neuromuscular re-education;Patient/family education    PT Next Visit Plan  cont with dynamic standing balance activities, coordination exercises/activities for RLE - try walking, kicking bean bags; STG's to be checked next week    PT Home Exercise Plan  balance HEP and walking program    Consulted and Agree with Plan of Care  Patient;Family member/caregiver    Family Member Consulted  wife [name]       Patient will benefit from skilled therapeutic intervention in order to improve the following deficits and impairments:  Abnormal gait, Cardiopulmonary status limiting activity, Decreased activity tolerance, Decreased balance, Decreased cognition, Decreased coordination, Decreased endurance, Decreased strength  Visit Diagnosis: Other abnormalities of gait and mobility  Unsteadiness on feet  Other lack of coordination     Problem List Patient Active Problem List   Diagnosis Date Noted  . Moderate aortic stenosis 01/08/2018  . Ascending aorta dilatation (HCC) 01/08/2018  . Mixed hyperlipidemia 01/08/2018  . History of recent stroke 01/08/2018  . Hypoalbuminemia due to protein-calorie malnutrition ([place])   . Essential hypertension   . Cerebrovascular accident (CVA) due to occlusion of left posterior communicating artery ([place]) 12/10/2017  .  Cerebrovascular accident (CVA) due to thrombosis of left posterior cerebral artery ([place])   . Benign essential HTN   . Morbid obesity ([place])   . OSA (obstructive sleep apnea)   . Enteritis due to Clostridium difficile   . Acute CVA (cerebrovascular accident) ([place]) 12/01/2017  . Acute arterial ischemic stroke, multifocal, posterior circulation ([place]) 12/01/2017    Artemisia Auvil, [place], PT 01/23/2018, 2:02 PM  [facility] [address] [place], [place], [address] Phone: [phone]   Fax:  [phone]  Name: William Lowery MRN: [id number] Date of Birth: Aug 23, 1966

## 2018-01-23 NOTE — Patient Instructions (Addendum)
PLEASE FEEL FREE TO CALL OUR OFFICE WITH ANY PROBLEMS OR QUESTIONS ([phone])   LET ME KNOW IF YOU FEEL THAT YOUR MOOD IS WORSENING OR FAILING TO IMPROVE  LIMIT NAPS TO 1 HOUR OR LESS DURING THE DAY

## 2018-01-24 ENCOUNTER — Encounter: Payer: Self-pay | Admitting: Physical Therapy

## 2018-01-24 ENCOUNTER — Ambulatory Visit: Payer: PRIVATE HEALTH INSURANCE | Admitting: Physical Therapy

## 2018-01-24 DIAGNOSIS — I69351 Hemiplegia and hemiparesis following cerebral infarction affecting right dominant side: Secondary | ICD-10-CM | POA: Diagnosis not present

## 2018-01-24 DIAGNOSIS — M6281 Muscle weakness (generalized): Secondary | ICD-10-CM

## 2018-01-24 DIAGNOSIS — R2689 Other abnormalities of gait and mobility: Secondary | ICD-10-CM

## 2018-01-24 DIAGNOSIS — R278 Other lack of coordination: Secondary | ICD-10-CM

## 2018-01-24 DIAGNOSIS — R2681 Unsteadiness on feet: Secondary | ICD-10-CM

## 2018-01-24 NOTE — Therapy (Signed)
[facility] [address] [place], [place], [address] Phone: [phone]   Fax:  [phone]  Physical Therapy Treatment  Patient Details  Name: William Lowery MRN: [id number] Date of Birth: [birth year] Referring Provider: Dr. Alger Simons   Encounter Date: 01/24/2018  PT End of Session - 01/24/18 1252    Visit Number  5    Number of Visits  17    Date for PT Re-Evaluation  02/22/18    Authorization Type  Self Pay    PT Start Time  1100    PT Stop Time  1145    PT Time Calculation (min)  45 min    Equipment Utilized During Treatment  Gait belt    Activity Tolerance  Patient tolerated treatment well    Behavior During Therapy  [facility] for tasks assessed/performed       Past Medical History:  Diagnosis Date  . Asthma    hx of  . Heart murmur    as an infant  . Hypertension   . Sleep apnea    wears CPAP    Past Surgical History:  Procedure Laterality Date  . bellybutton procedure     as a newborn  . IR ANGIO INTRA EXTRACRAN SEL COM CAROTID INNOMINATE BILAT MOD SED  12/01/2017  . IR ANGIO VERTEBRAL SEL VERTEBRAL UNI R MOD SED  12/01/2017  . IR CT HEAD LTD  12/01/2017  . IR INTRA CRAN STENT  12/01/2017  . IR PERCUTANEOUS ART THROMBECTOMY/INFUSION INTRACRANIAL INC DIAG ANGIO  12/01/2017  . RADIOLOGY WITH ANESTHESIA N/A 12/01/2017   Procedure: RADIOLOGY WITH ANESTHESIA CODE STROKE;  Surgeon: Radiologist, Medication, MD;  Location: [facility];  Service: Radiology;  Laterality: N/A;  . TEE WITHOUT CARDIOVERSION N/A 12/06/2017   Procedure: TRANSESOPHAGEAL ECHOCARDIOGRAM (TEE);  Surgeon: Pixie Casino, MD;  Location: [facility] ENDOSCOPY;  Service: Cardiovascular;  Laterality: N/A;    There were no vitals filed for this visit.  Subjective Assessment - 01/24/18 1102    Subjective  Pt report no changes in medications, no pain, and no falls. Pt's wife report pt "William Lowery has a spot in the back of his head which he had loss hair in." The  plan on asking doctor and monitioring area.     Patient is accompained by:  Family member    Diagnostic tests  transesophageal echocardiogram 1-31-9    Patient Stated Goals  "I want to be able to move regular"        [facility] Adult PT Treatment/Exercise - 01/24/18 1231      Transfers   Transfers  Sit to Stand    Number of Reps  Other reps (comment) x 3 reps during session.      Ambulation/Gait   Ambulation/Gait  Yes    Ambulation/Gait Assistance  5: Supervision;4: Min guard    Ambulation/Gait Assistance Details  Pt performed ambulation with cognitive verbal dual task. Pt with minor decrease in gait speed due to dual task. No LOB episodes.     Ambulation Distance (Feet)  350 Feet    Assistive device  None    Gait Pattern  Ataxic    Ambulation Surface  Level;Indoor      Knee/Hip Exercises: Standing   Heel Raises  Both;10 reps;3 seconds;1 set;Other (comment) At stairs with BUE support. Initial verbal cues only.     Step Down  Both;Right;Left;Step Height: 6";5 reps;2 sets    Step Down Limitations  Performed at stairs with BUE support on stairs rails: 2  sets x 5 reps bilaterally. Eccentric single leg step downs with contralateral stance leg on first 6 inch step. With a focus of slow and controlled eccentric and concentric motion. Verbal cue for soft landing required. Provided verbal and demo cues for new task.       Knee/Hip Exercises: Seated   Hamstring Curl  Strengthening;Right;Both;Left;1 set;Other (comment);15 reps Used blue theraband with SPTA holding band.    Hamstring Limitations  x 15 reps B, with 3-5 sec hold at end range and slow and controlled concentric motion. Provided verbal, and demo cues.        Balance Exercises - 01/24/18 1245      Balance Exercises: Standing   Standing Eyes Opened  Narrow base of support (BOS);Foam/compliant surface;30 secs;2 reps    Standing Eyes Closed  Narrow base of support (BOS);Wide (BOA);Foam/compliant surface;2 reps;30 secs    Tandem Stance   Eyes open;Foam/compliant surface;2 reps;10 secs    Rockerboard  Anterior/posterior;EO;10 reps;UE support;Intermittent UE support;EC;30 seconds;Lateral      Balance Exercises: Standing   Standing Eyes Opened Limitations  Standing on airex pad at corner, placed chair in front of pt and SPTA held on to gait belt providing min guard to min assistance for safety during all tasks. EO for 30 sec x 2 reps, EO with narrow BOS for 30 sec x 2, EO with staggered tandem for 30 sec x 2 bilaterally. Added UE flexion to 90 degrees x 10 reps to EO narrow BOS and tandem stance. During tandem, with cues to keep feet flat and even due to tendency of unequal weight distribution. Pt with swaying in various directions and phasic muscle shaking during tasks.     Standing Eyes Closed Time  Standing on airex pad for 30 sec x 2 reps each task, wide stance, narrow BOS,.    Tandem Stance Limitations  Standing on airex pad, performed only with EO. Pt required verbal and demo cues for proper foot placement, upright posture, and use of ankle strategy to correct balance.     Rebounder Limitations  Rockerboard medium size used for each task. Anterior/ posterior weightshifting , lateral side to side weight shifting x 10 reps with 5 sec holds each. Also, added standing EO and EC standing on rockerboard maintaining upright posture x2 reps for 30 secs. Provided minimal assistance and used gait belt for safety. Pt with mostly lateral swaying/leaning; which required occasional UE touch on parallel bars to regain upright balance.          PT Short Term Goals - 01/23/18 1400      PT SHORT TERM GOAL #1   Title  Increase Berg score from 41/56 to >/= 46/56 to reduce fall risk.    Baseline  41/56    Time  4    Period  Weeks    Status  New    Target Date  01/29/18 extend 1 week out due to pt not attending PT week of 01-14-18      PT SHORT TERM GOAL #2   Title  Improve TUG score from 14.6 secs to </= 12.0 secs without device for improved  functional mobility.    Baseline  14.6 secs with no device    Time  4    Period  Weeks    Status  New    Target Date  01/29/18      PT SHORT TERM GOAL #3   Title  Increase distance in 3" walk test by at least 100' with use of  RW to demo improved endurance/activity tolerance.    Baseline  To be assessed next session    Target Date  01/29/18      PT SHORT TERM GOAL #4   Title  Negotiate steps (4) with no rail using a step by step sequence with SBA.    Time  4    Period  Weeks    Status  New    Target Date  01/29/18      PT SHORT TERM GOAL #5   Title  Independent in HEP for balance and RLE strengthening.    Time  4    Period  Weeks    Status  New    Target Date  01/29/18        PT Long Term Goals - 12/26/17 1418      PT LONG TERM GOAL #1   Title  Increase Berg score from 41/56 to >/= 51/56 to decr. fall risk.    Baseline  41/56 on 12-25-17    Time  8    Period  Weeks    Status  New    Target Date  02/22/18      PT LONG TERM GOAL #2   Title  Pt will amb. 1000' without device modified independently on all surfaces.     Baseline  pt amb. with RW 100' with SBA    Time  8    Period  Weeks    Status  New    Target Date  02/22/18      PT LONG TERM GOAL #3   Title  Negotiate 4 steps without rail using a step over step sequence.    Time  8    Period  Weeks    Status  New    Target Date  02/22/18      PT LONG TERM GOAL #4   Title  Independent in updated HEP, including community fitness options for continued HEP.    Time  8    Period  Weeks    Status  New    Target Date  02/22/18      PT LONG TERM GOAL #5   Title  Increase gait velocity from 2.92 ft/sec without device to >/= 3.7 ft/sec without device for incr. gait efficiency.    Baseline  2.92 ft/sec with no device on 12-25-17    Time  8    Period  Weeks    Status  New    Target Date  02/22/18        Plan - 01/24/18 1253    Clinical Impression Statement  Today's session focused on gait training with static  balance drills on uneven surfaces, standing balance with vision removed task, gait with dual tasks, and bilateral lower extremity strengthening. Pt is making progress towards STG's and would benefit from continued PT towards unmet goals.     History and Personal Factors relevant to plan of care:  pt is self pay; has supportive spouse William Lowery); was self employed- owned [employer]; loop recorder implant during hospitalization    Clinical Presentation  Evolving    Clinical Presentation due to:  L PCA CVA - multi infarct    Clinical Decision Making  Moderate    Rehab Potential  Good    PT Frequency  2x / week    PT Treatment/Interventions  ADLs/Self Care Home Management;Stair training;Gait training;DME Instruction;Therapeutic activities;Therapeutic exercise;Balance training;Neuromuscular re-education;Patient/family education    PT Next Visit Plan  STG's due 01/29/18. Cont with dynamic  standing balance activities, coordination exercises/activities for RLE - try walking, kicking bean bags;     PT Home Exercise Plan  balance HEP and walking program    Consulted and Agree with Plan of Care  Patient;Family member/caregiver    Family Member Consulted  wife William Lowery       Patient will benefit from skilled therapeutic intervention in order to improve the following deficits and impairments:  Abnormal gait, Cardiopulmonary status limiting activity, Decreased activity tolerance, Decreased balance, Decreased cognition, Decreased coordination, Decreased endurance, Decreased strength  Visit Diagnosis: Other abnormalities of gait and mobility  Unsteadiness on feet  Other lack of coordination  Muscle weakness (generalized)  Hemiplegia and hemiparesis following cerebral infarction affecting right dominant side [facility])   Problem List Patient Active Problem List   Diagnosis Date Noted  . Moderate aortic stenosis 01/08/2018  . Ascending aorta dilatation (HCC) 01/08/2018  . Mixed hyperlipidemia  01/08/2018  . History of recent stroke 01/08/2018  . Hypoalbuminemia due to protein-calorie malnutrition ([place])   . Essential hypertension   . 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 Cerebrovascular accident (CVA) due to thrombosis of left posterior cerebral artery ([place])   . Benign essential HTN   . Morbid obesity ([place])   . OSA (obstructive sleep apnea)   . Enteritis due to Clostridium difficile   . Acute CVA (cerebrovascular accident) ([place]) 12/01/2017  . Acute arterial ischemic stroke, multifocal, posterior circulation ([place]) 12/01/2017    Carlena Sax, SPTA 01/24/2018, 1:00 PM  [facility] [address] [place], [place], [address] Phone: [phone]   Fax:  [phone]  Name: William Lowery MRN: [id number] Date of Birth: 09-18-1966

## 2018-01-25 ENCOUNTER — Other Ambulatory Visit: Payer: Self-pay | Admitting: Internal Medicine

## 2018-01-25 DIAGNOSIS — E041 Nontoxic single thyroid nodule: Secondary | ICD-10-CM

## 2018-01-28 ENCOUNTER — Encounter: Payer: Self-pay | Admitting: Occupational Therapy

## 2018-01-28 ENCOUNTER — Ambulatory Visit: Payer: PRIVATE HEALTH INSURANCE | Admitting: Physical Therapy

## 2018-01-28 ENCOUNTER — Ambulatory Visit: Payer: PRIVATE HEALTH INSURANCE | Admitting: Speech Pathology

## 2018-01-28 ENCOUNTER — Ambulatory Visit: Payer: PRIVATE HEALTH INSURANCE | Admitting: Occupational Therapy

## 2018-01-28 DIAGNOSIS — R2681 Unsteadiness on feet: Secondary | ICD-10-CM

## 2018-01-28 DIAGNOSIS — I69351 Hemiplegia and hemiparesis following cerebral infarction affecting right dominant side: Secondary | ICD-10-CM

## 2018-01-28 DIAGNOSIS — R41842 Visuospatial deficit: Secondary | ICD-10-CM

## 2018-01-28 DIAGNOSIS — M6281 Muscle weakness (generalized): Secondary | ICD-10-CM

## 2018-01-28 DIAGNOSIS — I69318 Other symptoms and signs involving cognitive functions following cerebral infarction: Secondary | ICD-10-CM

## 2018-01-28 DIAGNOSIS — R41841 Cognitive communication deficit: Secondary | ICD-10-CM

## 2018-01-28 DIAGNOSIS — R278 Other lack of coordination: Secondary | ICD-10-CM

## 2018-01-28 DIAGNOSIS — R2689 Other abnormalities of gait and mobility: Secondary | ICD-10-CM

## 2018-01-28 DIAGNOSIS — R4701 Aphasia: Secondary | ICD-10-CM

## 2018-01-28 NOTE — Therapy (Signed)
[facility] [address] [place], [place], [address] Phone: [phone]   Fax:  [phone]  Occupational Therapy Treatment  Patient Details  Name: William Lowery MRN: [id number] Date of Birth: 04-04-1966 Referring Provider: Dr. Alger Simons   Encounter Date: 01/28/2018  OT End of Session - 01/28/18 1713    Visit Number  5    Number of Visits  16    Date for OT Re-Evaluation  02/19/18    Authorization Type  self pay    OT Start Time  1446    OT Stop Time  1531    OT Time Calculation (min)  45 min    Activity Tolerance  Patient tolerated treatment well       Past Medical History:  Diagnosis Date  . Asthma    hx of  . Heart murmur    as an infant  . Hypertension   . Sleep apnea    wears CPAP    Past Surgical History:  Procedure Laterality Date  . bellybutton procedure     as a newborn  . IR ANGIO INTRA EXTRACRAN SEL COM CAROTID INNOMINATE BILAT MOD SED  12/01/2017  . IR ANGIO VERTEBRAL SEL VERTEBRAL UNI R MOD SED  12/01/2017  . IR CT HEAD LTD  12/01/2017  . IR INTRA CRAN STENT  12/01/2017  . IR PERCUTANEOUS ART THROMBECTOMY/INFUSION INTRACRANIAL INC DIAG ANGIO  12/01/2017  . RADIOLOGY WITH ANESTHESIA N/A 12/01/2017   Procedure: RADIOLOGY WITH ANESTHESIA CODE STROKE;  Surgeon: Radiologist, Medication, MD;  Location: [facility] [facility];  Service: Radiology;  Laterality: N/A;  . TEE WITHOUT CARDIOVERSION N/A 12/06/2017   Procedure: TRANSESOPHAGEAL ECHOCARDIOGRAM (TEE);  Surgeon: Pixie Casino, MD;  Location: [facility] ENDOSCOPY;  Service: Cardiovascular;  Laterality: N/A;    There were no vitals filed for this visit.  Subjective Assessment - 01/28/18 1451    Subjective   It really helped me to see the concrete progress last week in our session    Pertinent History  12/01/2017 L PCA CVA     Patient Stated Goals  I hope to get as much of my right hand working again as I can    Currently in Pain?  No/denies                    OT Treatments/Exercises (OP) - 01/28/18 0001      ADLs   Writing  Pt has been working on tracing activities at home with large marker - pt with some improvement in control.  Today addressed using brown foam with coban over regular pen and using start, stop,relax method in between making parts of letters.  Also addressed using proximal stablization at elbow, forearm and wrist to assist in decreasing ataxia and intentional tremor.  Pt also with increased control of R hand with intemittent heavy proprioception to back of hand and fingers.  Showed wife how to provide at home.  Pt able to legibly write first letter of name with this technique.  Pt to practice at home as possible.      ADL Comments  Pt reports he is now getting up alone in the morning, fixing his own breakfast, showering and dressing. Wife continues to assist pt with washing his feet as pt does not want to sit in shower and feels unsteady with strategy practice last session.  Had pt try leaning on wall and using small step stool and pt able to return demonstrate in clinic. Pt and wife to  try at home.  Pt would be mod I in shower if he sat however does not wish to do this.               OT Education - 01/28/18 1710    Education provided  Yes    Education Details  writing strategies to begin making single letters    Person(s) Educated  Patient;Spouse    Methods  Explanation;Demonstration;Verbal cues    Comprehension  Verbalized understanding;Returned demonstration       OT Short Term Goals - 01/28/18 1711      OT SHORT TERM GOAL #1   Title  Pt and wife will be mod I with HEP for coordination, RUE strength and cognition - due 01/22/2018    Status  Achieved      OT SHORT TERM GOAL #2   Title  Pt will demonstrate improved gross control of RUE as evidenced by increasing by at least 5 blocks on Box and Blocks to assist with basic ADL's    Status  Achieved 01/22/2018  23 blocks      OT SHORT TERM GOAL  #3   Title  Pt will be mod I with bathing at shower level using AE for back prn    Status  Partially Met wife still wishes to provide distant supevision      OT SHORT TERM GOAL #4   Title  Pt will be mod I with dressing    Status  Achieved      OT SHORT TERM GOAL #5   Title  Pt will be mod I with toilet hygiene    Status  Achieved      OT SHORT TERM GOAL #6   Title  Pt will demonstrate inmproved grip strength by at least 5 pounds to assist with basic ADL and IADL tasks (baseline =20 pounds)    Status  Achieved 01/22/2018  32 pounds      OT SHORT TERM GOAL #7   Title  Pt will use RUE as stabilizer during ADL and IADL tasks    Status  Achieved        OT Long Term Goals - 01/28/18 1711      OT LONG TERM GOAL #1   Title  Pt will be mod I with upgraded HEP for cognition and RUE funcitonal use - 02/19/2018    Status  On-going      OT LONG TERM GOAL #2   Title  Pt will demonstrate improved gross motor control of RUE as evidenced by improving by at least 20 blocks on BOx and blocks to assist with IADL's    Status  On-going      OT LONG TERM GOAL #3   Title  Pt will demonstrate improved grip strength by at least  20 pounds to assist with IADL tasks ( baseline= 20 pounds)    Status  On-going      OT LONG TERM GOAL #4   Title  Pt will demonstrate ability for basic problem solving and organization with familiar functional task.    Status  On-going      OT LONG TERM GOAL #5   Title  Pt will demonstrate ability to write name with 100% legibility    Status  On-going      OT LONG TERM GOAL #6   Title  Pt will demonstrate ability for environmental scanning during physical task with 90% accuracy.    Status  On-going      OT LONG  TERM GOAL #7   Title  Pt will demonstrate ability to complete functional task on outdoor uneven surfaces involving basic problem solving and organization.     Status  On-going            Plan - 01/28/18 1711    Clinical Impression Statement  Pt  progressing toward goals. Pt with slowly improving control of RUE    Occupational Profile and client history currently impacting functional performance  PMH: morbid obesity, HTN, sleep apnea, small remote R cerebellar CVA, asthma. Pt with loop recorder!!!    Occupational performance deficits (Please refer to evaluation for details):  ADL's;IADL's;Work;Leisure;Social Participation    Rehab Potential  Good    Current Impairments/barriers affecting progress:  apraxia, ataxia, intention tremor    OT Frequency  2x / week    OT Duration  8 weeks    OT Treatment/Interventions  Self-care/ADL training;DME and/or AE instruction;Neuromuscular education;Therapeutic exercise;Therapist, nutritional;Therapeutic activities;Cognitive remediation/compensation;Visual/perceptual remediation/compensation;Patient/family education;Balance training    Plan  NMR for RUE funtional use including weight bearing and closed chain activities, balance, incorporate cognition , functional use of R hand, writing, assess eating?    Consulted and Agree with Plan of Care  Patient;Family member/caregiver    Family Member Consulted  wife [name]       Patient will benefit from skilled therapeutic intervention in order to improve the following deficits and impairments:  Abnormal gait, Decreased activity tolerance, Decreased balance, Decreased coordination, Decreased cognition, Decreased knowledge of use of DME, Decreased mobility, Decreased safety awareness, Difficulty walking, Decreased strength, Impaired UE functional use, Impaired sensation, Impaired vision/preception, Obesity  Visit Diagnosis: Unsteadiness on feet  Other lack of coordination  Muscle weakness (generalized)  Hemiplegia and hemiparesis following cerebral infarction affecting right dominant side (HCC)  Other symptoms and signs involving cognitive functions following cerebral infarction  Visuospatial deficit    Problem List Patient Active Problem List    Diagnosis Date Noted  . Moderate aortic stenosis 01/08/2018  . Ascending aorta dilatation (HCC) 01/08/2018  . Mixed hyperlipidemia 01/08/2018  . History of recent stroke 01/08/2018  . Hypoalbuminemia due to protein-calorie malnutrition ([place])   . Essential hypertension   . Cerebrovascular accident (CVA) due to occlusion of left posterior communicating artery ([place]) 12/10/2017  . Cerebrovascular accident (CVA) due to thrombosis of left posterior cerebral artery ([place])   . Benign essential HTN   . Morbid obesity ([place])   . OSA (obstructive sleep apnea)   . Enteritis due to Clostridium difficile   . Acute CVA (cerebrovascular accident) ([place]) 12/01/2017  . Acute arterial ischemic stroke, multifocal, posterior circulation ([place]) 12/01/2017    Quay Burow, OTR/L 01/28/2018, 5:14 PM  [facility] [address] [place], [place], [address] Phone: [phone]   Fax:  [phone]  Name: William Lowery MRN: [id number] Date of Birth: 03/16/1966

## 2018-01-29 ENCOUNTER — Encounter: Payer: Self-pay | Admitting: Physical Therapy

## 2018-01-29 NOTE — Therapy (Signed)
[facility] [address] [place], [place], [address] Phone: [phone]   Fax:  [phone]  Physical Therapy Treatment  Patient Details  Name: William Lowery MRN: [id number] Date of Birth: 04-Nov-1966 Referring Provider: Dr. Alger Simons   Encounter Date: 01/28/2018  PT End of Session - 01/29/18 1636    Visit Number  7    Number of Visits  17    Date for PT Re-Evaluation  02/22/18    Authorization Type  Self Pay    PT Start Time  5732    PT Stop Time  1618    PT Time Calculation (min)  44 min       Past Medical History:  Diagnosis Date  . Asthma    hx of  . Heart murmur    as an infant  . Hypertension   . Sleep apnea    wears CPAP    Past Surgical History:  Procedure Laterality Date  . bellybutton procedure     as a newborn  . IR ANGIO INTRA EXTRACRAN SEL COM CAROTID INNOMINATE BILAT MOD SED  12/01/2017  . IR ANGIO VERTEBRAL SEL VERTEBRAL UNI R MOD SED  12/01/2017  . IR CT HEAD LTD  12/01/2017  . IR INTRA CRAN STENT  12/01/2017  . IR PERCUTANEOUS ART THROMBECTOMY/INFUSION INTRACRANIAL INC DIAG ANGIO  12/01/2017  . RADIOLOGY WITH ANESTHESIA N/A 12/01/2017   Procedure: RADIOLOGY WITH ANESTHESIA CODE STROKE;  Surgeon: Radiologist, Medication, MD;  Location: [facility];  Service: Radiology;  Laterality: N/A;  . TEE WITHOUT CARDIOVERSION N/A 12/06/2017   Procedure: TRANSESOPHAGEAL ECHOCARDIOGRAM (TEE);  Surgeon: Pixie Casino, MD;  Location: [facility] ENDOSCOPY;  Service: Cardiovascular;  Laterality: N/A;    There were no vitals filed for this visit.  Subjective Assessment - 01/29/18 1044    Subjective  Pt reports no problems - doing well - OT reports he was able to immediately use pen in Rt hand today without difficulty as he has been unable to do in previous sessions    Patient is accompained by:  Family member    Diagnostic tests  transesophageal echocardiogram 1-31-9    Patient Stated Goals  "I want to be able to  move regular"    Currently in Pain?  No/denies                       [facility] Adult PT Treatment/Exercise - 01/29/18 0001      Transfers   Transfers  Sit to Stand    Sit to Stand  6: Modified independent (Device/Increase time)    Number of Reps  Other reps (comment) 3 reps      Ambulation/Gait   Ambulation/Gait  Yes    Ambulation/Gait Assistance  5: Supervision;4: Min guard    Ambulation/Gait Assistance Details  Pt performed amb. with dual tasks incorporating use of  RUE  - including tossing ball while walking forward 50', backward 50' with CGA to min assist: making circles with ball and visually tracking for improved balance with head movement:  amb. while passing green medicine ball from one hand to the other hand in a half circle ; amb. carrying 6" height box 50' x 2 reps    Ambulation Distance (Feet)  300 Feet total distance    Assistive device  None    Gait Pattern  Ataxic    Ambulation Surface  Level;Indoor    Gait Comments  No major LOB occurred with any dual task gait  activities      Balance   Balance Assessed  Yes      Standardized Balance Assessment   Standardized Balance Assessment  Timed Up and Go Test      Timed Up and Go Test   TUG  Normal TUG    Normal TUG (seconds)  12.94      High Level Balance   High Level Balance Activities  Side stepping;Braiding;Tandem walking    High Level Balance Comments  pt also performed crossovers and stepping behind inside // bars - with RUE support progressing to no UE support inside // bars      Knee/Hip Exercises: Standing   Heel Raises  Both;1 set;10 reps RLE only heel raises 10 reps with UE support      NeuroRe-ed:  Pt performed SLS/coordination activity - touching balance bubbles standing on floor with CGA to min assist with LE crossing midline Pt performed cone taps standing on blue mat and also on red mat - touching the top of the cones, then tipping over and standing upright with  Min assist for LOB; pt had  no LOB with touching top of cone only - standing on red/blue mat         PT Short Term Goals - 01/28/18 1535      PT SHORT TERM GOAL #1   Title  Increase Berg score from 41/56 to >/= 46/56 to reduce fall risk.    Baseline  41/56    Time  4    Period  Weeks    Status  New      PT SHORT TERM GOAL #2   Title  Improve TUG score from 14.6 secs to </= 12.0 secs without device for improved functional mobility.    Baseline  14.6 secs with no device; 12.94 secs on 01-28-18    Status  Not Met      PT SHORT TERM GOAL #3   Title  Increase distance in 3" walk test by at least 100' with use of RW to demo improved endurance/activity tolerance.    Period  Days    Status  New      PT SHORT TERM GOAL #4   Title  Negotiate steps (4) with no rail using a step by step sequence with SBA.    Baseline  met 01-28-18    Status  Achieved      PT SHORT TERM GOAL #5   Title  Independent in HEP for balance and RLE strengthening.    Status  Achieved        PT Long Term Goals - 12/26/17 1418      PT LONG TERM GOAL #1   Title  Increase Berg score from 41/56 to >/= 51/56 to decr. fall risk.    Baseline  41/56 on 12-25-17    Time  8    Period  Weeks    Status  New    Target Date  02/22/18      PT LONG TERM GOAL #2   Title  Pt will amb. 1000' without device modified independently on all surfaces.     Baseline  pt amb. with RW 100' with SBA    Time  8    Period  Weeks    Status  New    Target Date  02/22/18      PT LONG TERM GOAL #3   Title  Negotiate 4 steps without rail using a step over step sequence.    Time  8    Period  Weeks    Status  New    Target Date  02/22/18      PT LONG TERM GOAL #4   Title  Independent in updated HEP, including community fitness options for continued HEP.    Time  8    Period  Weeks    Status  New    Target Date  02/22/18      PT LONG TERM GOAL #5   Title  Increase gait velocity from 2.92 ft/sec without device to >/= 3.7 ft/sec without device for incr.  gait efficiency.    Baseline  2.92 ft/sec with no device on 12-25-17    Time  8    Period  Weeks    Status  New    Target Date  02/22/18            Plan - 01/29/18 1056    Clinical Impression Statement  Pt's TUG score improved but not to fully stated STG:  pt has met STG's #4 and 5:  gait pattern is improving with less ataxia noted; balance is improving with pt able to perform dual task with RUE use without LOB    Rehab Potential  Good    PT Frequency  2x / week    PT Duration  8 weeks    PT Treatment/Interventions  ADLs/Self Care Home Management;Stair training;Gait training;DME Instruction;Therapeutic activities;Therapeutic exercise;Balance training;Neuromuscular re-education;Patient/family education    PT Next Visit Plan  finish checking STG's - #1 and 3 need to be assessed: continue with dynamic gait and coordination activities for RLE    PT Home Exercise Plan  balance HEP and walking program    Consulted and Agree with Plan of Care  Patient;Family member/caregiver    Family Member Consulted  wife [name]       Patient will benefit from skilled therapeutic intervention in order to improve the following deficits and impairments:  Abnormal gait, Cardiopulmonary status limiting activity, Decreased activity tolerance, Decreased balance, Decreased cognition, Decreased coordination, Decreased endurance, Decreased strength  Visit Diagnosis: Other abnormalities of gait and mobility  Other lack of coordination  Unsteadiness on feet     Problem List Patient Active Problem List   Diagnosis Date Noted  . Moderate aortic stenosis 01/08/2018  . Ascending aorta dilatation (HCC) 01/08/2018  . Mixed hyperlipidemia 01/08/2018  . History of recent stroke 01/08/2018  . Hypoalbuminemia due to protein-calorie malnutrition ([place])   . Essential hypertension   . Cerebrovascular accident (CVA) due to occlusion of left posterior communicating artery ([place]) 12/10/2017  . Cerebrovascular accident  (CVA) due to thrombosis of left posterior cerebral artery ([place])   . Benign essential HTN   . Morbid obesity ([place])   . OSA (obstructive sleep apnea)   . Enteritis due to Clostridium difficile   . Acute CVA (cerebrovascular accident) ([place]) 12/01/2017  . Acute arterial ischemic stroke, multifocal, posterior circulation ([place]) 12/01/2017    Amie Cowens, [place], PT 01/29/2018, 4:39 PM  [facility] [address] [place], [place], [address] Phone: [phone]   Fax:  [phone]  Name: Dainel Arcidiacono MRN: [id number] Date of Birth: Aug 18, 1966

## 2018-01-31 ENCOUNTER — Encounter: Payer: Self-pay | Admitting: Physical Therapy

## 2018-01-31 ENCOUNTER — Ambulatory Visit: Payer: PRIVATE HEALTH INSURANCE | Admitting: Occupational Therapy

## 2018-01-31 ENCOUNTER — Other Ambulatory Visit (HOSPITAL_COMMUNITY): Payer: Self-pay | Admitting: Interventional Radiology

## 2018-01-31 ENCOUNTER — Ambulatory Visit: Payer: PRIVATE HEALTH INSURANCE | Admitting: Speech Pathology

## 2018-01-31 ENCOUNTER — Ambulatory Visit: Payer: PRIVATE HEALTH INSURANCE | Admitting: Physical Therapy

## 2018-01-31 ENCOUNTER — Encounter: Payer: Self-pay | Admitting: Occupational Therapy

## 2018-01-31 ENCOUNTER — Encounter: Payer: Self-pay | Admitting: Speech Pathology

## 2018-01-31 DIAGNOSIS — R2681 Unsteadiness on feet: Secondary | ICD-10-CM

## 2018-01-31 DIAGNOSIS — I69351 Hemiplegia and hemiparesis following cerebral infarction affecting right dominant side: Secondary | ICD-10-CM

## 2018-01-31 DIAGNOSIS — R41842 Visuospatial deficit: Secondary | ICD-10-CM

## 2018-01-31 DIAGNOSIS — I639 Cerebral infarction, unspecified: Secondary | ICD-10-CM

## 2018-01-31 DIAGNOSIS — R41841 Cognitive communication deficit: Secondary | ICD-10-CM

## 2018-01-31 DIAGNOSIS — I69318 Other symptoms and signs involving cognitive functions following cerebral infarction: Secondary | ICD-10-CM

## 2018-01-31 DIAGNOSIS — R270 Ataxia, unspecified: Secondary | ICD-10-CM

## 2018-01-31 DIAGNOSIS — R278 Other lack of coordination: Secondary | ICD-10-CM

## 2018-01-31 DIAGNOSIS — M6281 Muscle weakness (generalized): Secondary | ICD-10-CM

## 2018-01-31 DIAGNOSIS — R2689 Other abnormalities of gait and mobility: Secondary | ICD-10-CM

## 2018-01-31 NOTE — Therapy (Signed)
[facility] [address] [place], [place], [address] Phone: [phone]   Fax:  [phone]  Speech Language Pathology Treatment  Patient Details  Name: William Lowery MRN: [id number] Date of Birth: July 04, 1966 Referring Provider: Dr. Alger Simons   Encounter Date: 01/28/2018    Past Medical History:  Diagnosis Date  . Asthma    hx of  . Heart murmur    as an infant  . Hypertension   . Sleep apnea    wears CPAP    Past Surgical History:  Procedure Laterality Date  . bellybutton procedure     as a newborn  . IR ANGIO INTRA EXTRACRAN SEL COM CAROTID INNOMINATE BILAT MOD SED  12/01/2017  . IR ANGIO VERTEBRAL SEL VERTEBRAL UNI R MOD SED  12/01/2017  . IR CT HEAD LTD  12/01/2017  . IR INTRA CRAN STENT  12/01/2017  . IR PERCUTANEOUS ART THROMBECTOMY/INFUSION INTRACRANIAL INC DIAG ANGIO  12/01/2017  . RADIOLOGY WITH ANESTHESIA N/A 12/01/2017   Procedure: RADIOLOGY WITH ANESTHESIA CODE STROKE;  Surgeon: Radiologist, Medication, MD;  Location: [facility];  Service: Radiology;  Laterality: N/A;  . TEE WITHOUT CARDIOVERSION N/A 12/06/2017   Procedure: TRANSESOPHAGEAL ECHOCARDIOGRAM (TEE);  Surgeon: Pixie Casino, MD;  Location: [facility] ENDOSCOPY;  Service: Cardiovascular;  Laterality: N/A;    There were no vitals filed for this visit.     01/28/18 1617  Symptoms/Limitations  Subjective "He only asked me for 'chocolate lotion' once last week." pt's wife  Patient is accompained by: Family member  Pain Assessment  Currently in Pain? No/denies       01/28/18 1617  General Information  Behavior/Cognition Alert;Pleasant mood;Cooperative  Patient Positioning Upright in chair  Treatment Provided  Treatment provided Cognitive-Linquistic  Cognitive-Linquistic Treatment  Treatment focused on Cognition;Patient/family/caregiver education  Skilled Treatment Administered CLQT for assessment of cognition. Education provided re: deficit  areas, specifically attention and impact on memory, higher-level functions.  Assessment / Recommendations / Plan  Plan Continue with current plan of care;Goals updated  Progression Toward Goals  Progression toward goals Progressing toward goals      01/28/18 1416  SLP Visits / Re-Eval  Visit Number 3  Number of Visits 17  Date for SLP Re-Evaluation 03/01/18  SLP Time Calculation  SLP Start Time 7253  SLP Stop Time  1700  SLP Time Calculation (min) 44 min  SLP - End of Session  Activity Tolerance Patient tolerated treatment well     01/28/18 1617  Education  Education provided Yes  PT Education  Education Details deficit areas, impact of attention on memory, other cognitive functions  Person(s) Educated Patient;Spouse  Methods Explanation;Demonstration;Verbal cues  Comprehension Verbalized understanding;Need further instruction      01/28/18 1617  SLP Assessment/Plan  Clinical Impression Statement Patient presents with continued mild expressive language deficits and mild-moderate cognitive communication impairment. CLQT administered today for standardized assessment of cognition; pt scored in mild range of impairment for attention, memory, executive functions and visuospatial skills, however SLP judges pt's severity to be more in the mild-moderate range. Pt demo'd emergent awareness, independently noting errors in symbol cancellation and trailmaking task, however he was unable to self-correct trailmaking errors due to impaired alternating attention; mod cues required. Story recall was impaired, suspect due to attention> memory. Goals for cognition added. Skilled ST remains necessary for pt's improvement in cognitive-communication skills as well as language skills.  Speech Therapy Frequency 2x / week  Treatment/Interventions Cognitive reorganization;Multimodal communcation approach;Compensatory strategies;Language facilitation;Cueing hierarchy;Internal/external aids;Functional  tasks;SLP instruction and feedback;Patient/family education  Potential to Achieve Goals Good  Consulted and Agree with Plan of Care Patient;Family member/caregiver         SLP Short Term Goals - 01/28/18 1617      SLP SHORT TERM GOAL #1   Title  pt will use compensations for anomia in 10 minutes mod complex conversation     Time  3    Period  Weeks    Status  On-going      SLP SHORT TERM GOAL #2   Title  pt will name 8 items in mod abstract/complex category with rare min A     Time  3    Period  Weeks    Status  On-going      SLP SHORT TERM GOAL #3   Title  pt will participate in further assessment of language (reading, writing) and cognition and goals added PRN    Time  1    Period  Weeks    Status  Achieved      SLP SHORT TERM GOAL #4   Title  pt will demo alternating attention on simple cognitive linguistic tasks for 15 minutes in quiet environment     Time  3    Period  Weeks    Status  New      SLP SHORT TERM GOAL #5   Title  Pt will perform min-mod complex organization/planning/sequencing/finance tasks with 85% accuracy and rare min A     Time  3    Period  Weeks    Status  New       SLP Long Term Goals - 01/28/18 1417      SLP LONG TERM GOAL #1   Title  pt will demo functional verbal output in 10 minutes complex conversation over three sessions    Time  7    Period  Weeks    Status  On-going      SLP LONG TERM GOAL #2   Time  7    Period  Weeks    Status  On-going      SLP LONG TERM GOAL #3   Title  pt will demo alternating attention on mod complex cognitive linguistic tasks for 20 minutes in a mildly distracting environment    Time  7    Period  Weeks    Status  New      SLP LONG TERM GOAL #4   Title  pt will complete mod complex-complex organization/planning/sequencing tasks with 90% success over three sessions    Time  7    Period  Weeks    Status  New      SLP LONG TERM GOAL #5   Title  pt will ID errors in cognitive linguistic tasks in  90% of opportunities.    Time  7    Period  Weeks    Status  New         Patient will benefit from skilled therapeutic intervention in order to improve the following deficits and impairments:   Cognitive communication deficit  Aphasia    Problem List Patient Active Problem List   Diagnosis Date Noted  . Moderate aortic stenosis 01/08/2018  . Ascending aorta dilatation (HCC) 01/08/2018  . Mixed hyperlipidemia 01/08/2018  . History of recent stroke 01/08/2018  . Hypoalbuminemia due to protein-calorie malnutrition ([place])   . Essential hypertension   . Cerebrovascular accident (CVA) due to occlusion of left posterior communicating artery ([place]) 12/10/2017  . Cerebrovascular  accident (CVA) due to thrombosis of left posterior cerebral artery ([place])   . Benign essential HTN   . Morbid obesity ([place])   . OSA (obstructive sleep apnea)   . Enteritis due to Clostridium difficile   . Acute CVA (cerebrovascular accident) ([place]) 12/01/2017  . Acute arterial ischemic stroke, multifocal, posterior circulation ([place]) 12/01/2017   Deneise Lever, [place], [place] 01/31/2018, 8:01 AM  [facility] [address] [place], [place], [address] Phone: [phone]   Fax:  [phone]   Name: William Lowery MRN: [id number] Date of Birth: [birth year]

## 2018-01-31 NOTE — Patient Instructions (Signed)
   Cognitive Activities you can do at home:   - [place] (easy level)  - [place]  - jig saws  - [place]   Tips to help facilitate better attention, concentration, focus   Do harder, longer tasks when you are most alert/awake  Break down larger tasks into small parts  Limit distractions of TV, radio, conversation, e mails/texts, appliance noise, etc - if a job is important, do it in a quiet room  Be aware of how you are functioning in high stimulation environments such as large stores, parties, restaurants - any place with lots of lights, noise, signs etc  Group conversations may be more difficult to process than one on one conversations  Give yourself extra time to process conversation, reading materials, directions or information from your healthcare providers  Organization is key - clutters of laundry, mail, paperwork, dirty dishes - all make it more difficult to concentrate  Before you start a task, have all the needed supplies, directions, recipes ready and organized. This way you don't have to go looking for something in the middle of a task and become distracted.   Be aware of fatigue - take rests or breaks when needed to re-group and re-  Gradually increase the amount of time you focus on a cognitive task

## 2018-01-31 NOTE — Therapy (Signed)
[facility] [address] [place], [place], [address] Phone: [phone]   Fax:  [phone]  Physical Therapy Treatment  Patient Details  Name: William Lowery MRN: [id number] Date of Birth: 1966/07/19 Referring Provider: Dr. Alger Simons   Encounter Date: 01/31/2018  PT End of Session - 01/31/18 1546    Visit Number  8    Number of Visits  17    Date for PT Re-Evaluation  02/22/18    Authorization Type  Self Pay    PT Start Time  0300    PT Stop Time  1359    PT Time Calculation (min)  42 min    Equipment Utilized During Treatment  Gait belt    Activity Tolerance  Patient tolerated treatment well    Behavior During Therapy  [facility] for tasks assessed/performed       Past Medical History:  Diagnosis Date  . Asthma    hx of  . Heart murmur    as an infant  . Hypertension   . Sleep apnea    wears CPAP    Past Surgical History:  Procedure Laterality Date  . bellybutton procedure     as a newborn  . IR ANGIO INTRA EXTRACRAN SEL COM CAROTID INNOMINATE BILAT MOD SED  12/01/2017  . IR ANGIO VERTEBRAL SEL VERTEBRAL UNI R MOD SED  12/01/2017  . IR CT HEAD LTD  12/01/2017  . IR INTRA CRAN STENT  12/01/2017  . IR PERCUTANEOUS ART THROMBECTOMY/INFUSION INTRACRANIAL INC DIAG ANGIO  12/01/2017  . RADIOLOGY WITH ANESTHESIA N/A 12/01/2017   Procedure: RADIOLOGY WITH ANESTHESIA CODE STROKE;  Surgeon: Radiologist, Medication, MD;  Location: [facility];  Service: Radiology;  Laterality: N/A;  . TEE WITHOUT CARDIOVERSION N/A 12/06/2017   Procedure: TRANSESOPHAGEAL ECHOCARDIOGRAM (TEE);  Surgeon: Pixie Casino, MD;  Location: [facility] ENDOSCOPY;  Service: Cardiovascular;  Laterality: N/A;    Subjective Assessment - 01/31/18 1318    Subjective  Pt reports no pain, no falls, and no changes in medications.     Patient is accompained by:  Family member    Diagnostic tests  transesophageal echocardiogram 1-31-9    Patient Stated Goals  "I want  to be able to move regular"    Currently in Pain?  No/denies         [facility] PT Assessment - 01/31/18 1325      6 Minute Walk- Baseline   6 Minute Walk- Baseline  yes      6 Minute walk- Post Test   6 Minute Walk Post Test  yes    BP (mmHg)  135/95  (Abnormal)  manual    HR (bpm)  77    02 Sat (%RA)  99 %    Modified Borg Scale for Dyspnea  0- Nothing at all    Perceived Rate of Exertion (Borg)  10-      6 minute walk test results    Aerobic Endurance Distance Walked  696    Endurance additional comments  3 minute walk test with no AD. min guard assist with no rest breaks taken.       Berg Balance Test   Sit to Stand  Able to stand without using hands and stabilize independently    Standing Unsupported  Able to stand safely 2 minutes    Sitting with Back Unsupported but Feet Supported on Floor or Stool  Able to sit safely and securely 2 minutes    Stand to Sit  Sits  safely with minimal use of hands    Transfers  Able to transfer safely, definite need of hands    Standing Unsupported with Eyes Closed  Able to stand 10 seconds safely    Standing Ubsupported with Feet Together  Able to place feet together independently and stand 1 minute safely    From Standing, Reach Forward with Outstretched Arm  Can reach confidently >25 cm (10") 11    From Standing Position, Pick up Object from Floor  Able to pick up shoe safely and easily    From Standing Position, Turn to Look Behind Over each Shoulder  Looks behind from both sides and weight shifts well    Turn 360 Degrees  Able to turn 360 degrees safely in 4 seconds or less    Standing Unsupported, Alternately Place Feet on Step/Stool  Able to stand independently and complete 8 steps >20 seconds    Standing Unsupported, One Foot in Front  Able to place foot tandem independently and hold 30 seconds    Standing on One Leg  Able to lift leg independently and hold 5-10 seconds    Total Score  53          [facility] Adult PT Treatment/Exercise -  01/31/18 1555      Transfers   Transfers  Sit to Stand    Sit to Stand  6: Modified independent (Device/Increase time)    Number of Reps  10 reps;Other reps (comment) During rest breaks.       Ambulation/Gait   Ambulation/Gait  Yes    Ambulation/Gait Assistance  5: Supervision;4: Min guard    Ambulation/Gait Assistance Details  Min guard to supervision to ensure safety.     Ambulation Distance (Feet)  696 Feet plus 40 feet x 2.     Assistive device  None    Gait Pattern  Ataxic    Ambulation Surface  Level;Indoor    Gait Comments  No LOB issues today.      High Level Balance   High Level Balance Activities  Marching forwards;Marching backwards;Tandem walking    High Level Balance Comments  pt performed forward and backwards walking inside // bars - with no support required. Performed tandem walking forward with bilateral two finger touch to two hand hold during swaying. Supervision assistance provided.       Knee/Hip Exercises: Standing   Lateral Step Up  Right;Left;Both;10 reps;Step Height: 6";Limitations    Lateral Step Up Limitations  Pt required verbal and demo cues for task. Pt used single UE support on stair rail during task.     Step Down  Both;Right;Left;Step Height: 6";5 reps;2 sets    Step Down Limitations  Performed at stairs with progressed to No support on stairs rails: 2 sets x 5 reps bilaterally. Eccentric single leg step downs with contralateral stance leg on first 6 inch step. With a focus of slow and controlled eccentric and concentric motion. Verbal cue for soft landing required. Provided verbal and demo cues for new task.         PT Short Term Goals - 01/31/18 1319      PT SHORT TERM GOAL #1   Title  Increase Berg score from 41/56 to >/= 46/56 to reduce fall risk.    Baseline  01/31/18: BERG score 52/56 today.    Status  Achieved    Target Date  --      PT SHORT TERM GOAL #2   Title  Improve TUG score from 14.6 secs  to </= 12.0 secs without device for improved  functional mobility.    Baseline  14.6 secs with no device; 12.94 secs on 01-28-18    Status  Not Met    Target Date  --      PT SHORT TERM GOAL #3   Title  Increase distance in 3" walk test by at least 100' with use of RW to demo improved endurance/activity tolerance.    Baseline  01/31/18: 696 Feet today without AD to demo improved endurance/activity tolerance. (494 feet at baseline)    Status  Achieved    Target Date  --      PT SHORT TERM GOAL #4   Title  Negotiate steps (4) with no rail using a step by step sequence with SBA.    Baseline  met 01-28-18    Status  Achieved    Target Date  --      PT SHORT TERM GOAL #5   Title  Independent in HEP for balance and RLE strengthening.    Baseline  01/31/2018: Pt verbalized independent in HEP for balance and RLE strengthening.     Status  Achieved    Target Date  --           PT Long Term Goals - 12/26/17 1418      PT LONG TERM GOAL #1   Title  Increase Berg score from 41/56 to >/= 51/56 to decr. fall risk.    Baseline  41/56 on 12-25-17    Time  8    Period  Weeks    Status  New    Target Date  02/22/18      PT LONG TERM GOAL #2   Title  Pt will amb. 1000' without device modified independently on all surfaces.     Baseline  pt amb. with RW 100' with SBA    Time  8    Period  Weeks    Status  New    Target Date  02/22/18      PT LONG TERM GOAL #3   Title  Negotiate 4 steps without rail using a step over step sequence.    Time  8    Period  Weeks    Status  New    Target Date  02/22/18      PT LONG TERM GOAL #4   Title  Independent in updated HEP, including community fitness options for continued HEP.    Time  8    Period  Weeks    Status  New    Target Date  02/22/18      PT LONG TERM GOAL #5   Title  Increase gait velocity from 2.92 ft/sec without device to >/= 3.7 ft/sec without device for incr. gait efficiency.    Baseline  2.92 ft/sec with no device on 12-25-17    Time  8    Period  Weeks    Status  New      Target Date  02/22/18            Plan - 01/31/18 1547    Clinical Impression Statement  Today's session focused on checking the remainer of STG's, dynamic walking on level surfaces, and bilateral lower extremity strengthening. Pt demonstrated the ability to pass STG's #1, #3, #5; Regarding BERG test for decreased fall risk, walking test to demonstrate improved endurance, and HEP independence. Pt is making steady progression and would benefit from continued PT services towards meeting PT LTG's.  History and Personal Factors relevant to plan of care:  pt is self pay; has supportive spouse [name]); was self employed- owned [employer]; loop recorder implant during hospitalization    Clinical Presentation  Evolving    Clinical Presentation due to:  L PCA CVA - multi infarct    Clinical Decision Making  Moderate    Rehab Potential  Good    PT Frequency  2x / week    PT Duration  8 weeks    PT Treatment/Interventions  ADLs/Self Care Home Management;Stair training;Gait training;DME Instruction;Therapeutic activities;Therapeutic exercise;Balance training;Neuromuscular re-education;Patient/family education    PT Next Visit Plan  Continue with dynamic gait, step downs, and coordination activities for RLE.    PT Home Exercise Plan  balance HEP and walking program    Recommended Other Services  pt is receiving OT    Consulted and Agree with Plan of Care  Patient;Family member/caregiver    Family Member Consulted  wife [name]       Patient will benefit from skilled therapeutic intervention in order to improve the following deficits and impairments:  Abnormal gait, Cardiopulmonary status limiting activity, Decreased activity tolerance, Decreased balance, Decreased cognition, Decreased coordination, Decreased endurance, Decreased strength  Visit Diagnosis: Other abnormalities of gait and mobility  Muscle weakness (generalized)  Unsteadiness on feet  Hemiplegia and hemiparesis  following cerebral infarction affecting right dominant side [facility])     Problem List Patient Active Problem List   Diagnosis Date Noted  . Moderate aortic stenosis 01/08/2018  . Ascending aorta dilatation (HCC) 01/08/2018  . Mixed hyperlipidemia 01/08/2018  . History of recent stroke 01/08/2018  . Hypoalbuminemia due to protein-calorie malnutrition ([place])   . Essential hypertension   . Cerebrovascular accident (CVA) due to occlusion of left posterior communicating artery ([place]) 12/10/2017  . Cerebrovascular accident (CVA) due to thrombosis of left posterior cerebral artery ([place])   . Benign essential HTN   . Morbid obesity ([place])   . OSA (obstructive sleep apnea)   . Enteritis due to Clostridium difficile   . Acute CVA (cerebrovascular accident) ([place]) 12/01/2017  . Acute arterial ischemic stroke, multifocal, posterior circulation ([place]) 12/01/2017    Carlena Sax, SPTA 01/31/2018, 4:05 PM  [facility] [address] [place], [place], [address] Phone: [phone]   Fax:  [phone]  Name: Rory Xiang MRN: [id number] Date of Birth: [birth year]

## 2018-01-31 NOTE — Therapy (Signed)
[facility] [address] [place], [place], [address] Phone: [phone]   Fax:  [phone]  Occupational Therapy Treatment  Patient Details  Name: William Lowery MRN: [id number] Date of Birth: Jul 07, 1966 Referring Provider: Dr. Alger Simons   Encounter Date: 01/31/2018  OT End of Session - 01/31/18 1703    Visit Number  6    Number of Visits  16    Date for OT Re-Evaluation  02/19/18    Authorization Type  self pay    OT Start Time  1447    OT Stop Time  1530    OT Time Calculation (min)  43 min    Activity Tolerance  Patient tolerated treatment well       Past Medical History:  Diagnosis Date  . Asthma    hx of  . Heart murmur    as an infant  . Hypertension   . Sleep apnea    wears CPAP    Past Surgical History:  Procedure Laterality Date  . bellybutton procedure     as a newborn  . IR ANGIO INTRA EXTRACRAN SEL COM CAROTID INNOMINATE BILAT MOD SED  12/01/2017  . IR ANGIO VERTEBRAL SEL VERTEBRAL UNI R MOD SED  12/01/2017  . IR CT HEAD LTD  12/01/2017  . IR INTRA CRAN STENT  12/01/2017  . IR PERCUTANEOUS ART THROMBECTOMY/INFUSION INTRACRANIAL INC DIAG ANGIO  12/01/2017  . RADIOLOGY WITH ANESTHESIA N/A 12/01/2017   Procedure: RADIOLOGY WITH ANESTHESIA CODE STROKE;  Surgeon: Radiologist, Medication, MD;  Location: [facility];  Service: Radiology;  Laterality: N/A;  . TEE WITHOUT CARDIOVERSION N/A 12/06/2017   Procedure: TRANSESOPHAGEAL ECHOCARDIOGRAM (TEE);  Surgeon: Pixie Casino, MD;  Location: [facility] ENDOSCOPY;  Service: Cardiovascular;  Laterality: N/A;    There were no vitals filed for this visit.  Subjective Assessment - 01/31/18 1450    Subjective   I have trouble using my right hand for the remote control and other things    Patient is accompained by:  Family member wife    Pertinent History  12/01/2017 L PCA CVA     Patient Stated Goals  I hope to get as much of my right hand working again as I can    Currently in Pain?  No/denies                   OT Treatments/Exercises (OP) - 01/31/18 1657      Neurological Re-education Exercises   Other Exercises 1  Neuro re ed to address various functional tasks including using remote control for tv, drinking from a cup, picking up utensils.  Pt with apraxia, ataxia, and intention tremor.  Also practiced using stabilization strategies, repetition and weight bearing to improve control of RUE.  Pt will need additonal practice and instruction.                OT Short Term Goals - 01/31/18 1701      OT SHORT TERM GOAL #1   Title  Pt and wife will be mod I with HEP for coordination, RUE strength and cognition - due 01/22/2018    Status  Achieved      OT SHORT TERM GOAL #2   Title  Pt will demonstrate improved gross control of RUE as evidenced by increasing by at least 5 blocks on Box and Blocks to assist with basic ADL's    Status  Achieved 01/22/2018  23 blocks      OT SHORT TERM  GOAL #3   Title  Pt will be mod I with bathing at shower level using AE for back prn    Status  Partially Met wife still wishes to provide distant supevision      OT SHORT TERM GOAL #4   Title  Pt will be mod I with dressing    Status  Achieved      OT SHORT TERM GOAL #5   Title  Pt will be mod I with toilet hygiene    Status  Achieved      OT SHORT TERM GOAL #6   Title  Pt will demonstrate inmproved grip strength by at least 5 pounds to assist with basic ADL and IADL tasks (baseline =20 pounds)    Status  Achieved 01/22/2018  32 pounds      OT SHORT TERM GOAL #7   Title  Pt will use RUE as stabilizer during ADL and IADL tasks    Status  Achieved        OT Long Term Goals - 01/31/18 1701      OT LONG TERM GOAL #1   Title  Pt will be mod I with upgraded HEP for cognition and RUE funcitonal use - 02/19/2018    Status  On-going      OT LONG TERM GOAL #2   Title  Pt will demonstrate improved gross motor control of RUE as evidenced by  improving by at least 20 blocks on BOx and blocks to assist with IADL's    Status  On-going      OT LONG TERM GOAL #3   Title  Pt will demonstrate improved grip strength by at least  20 pounds to assist with IADL tasks ( baseline= 20 pounds)    Status  On-going      OT LONG TERM GOAL #4   Title  Pt will demonstrate ability for basic problem solving and organization with familiar functional task.    Status  On-going      OT LONG TERM GOAL #5   Title  Pt will demonstrate ability to write name with 100% legibility    Status  On-going      OT LONG TERM GOAL #6   Title  Pt will demonstrate ability for environmental scanning during physical task with 90% accuracy.    Status  On-going      OT LONG TERM GOAL #7   Title  Pt will demonstrate ability to complete functional task on outdoor uneven surfaces involving basic problem solving and organization.     Status  On-going            Plan - 01/31/18 1702    Clinical Impression Statement  Pt progressing toward goals. Pt with more evident intention tremor as he is using RUE more and more functionally    Occupational Profile and client history currently impacting functional performance  PMH: morbid obesity, HTN, sleep apnea, small remote R cerebellar CVA, asthma. Pt with loop recorder!!!    Occupational performance deficits (Please refer to evaluation for details):  ADL's;IADL's;Work;Leisure;Social Participation    Rehab Potential  Good    Current Impairments/barriers affecting progress:  apraxia, ataxia, intention tremor    OT Frequency  2x / week    OT Duration  8 weeks    OT Treatment/Interventions  Self-care/ADL training;DME and/or AE instruction;Neuromuscular education;Therapeutic exercise;Therapist, nutritional;Therapeutic activities;Cognitive remediation/compensation;Visual/perceptual remediation/compensation;Patient/family education;Balance training    Plan  NMR for RUE funtional use including weight bearing and closed chain  activities,  balance, incorporate cognition , functional use of R hand, writing, assess eating?    Consulted and Agree with Plan of Care  Patient;Family member/caregiver    Family Member Consulted  wife [name]       Patient will benefit from skilled therapeutic intervention in order to improve the following deficits and impairments:  Abnormal gait, Decreased activity tolerance, Decreased balance, Decreased coordination, Decreased cognition, Decreased knowledge of use of DME, Decreased mobility, Decreased safety awareness, Difficulty walking, Decreased strength, Impaired UE functional use, Impaired sensation, Impaired vision/preception, Obesity  Visit Diagnosis: Other lack of coordination  Unsteadiness on feet  Muscle weakness (generalized)  Hemiplegia and hemiparesis following cerebral infarction affecting right dominant side (HCC)  Other symptoms and signs involving cognitive functions following cerebral infarction  Visuospatial deficit  Ataxia    Problem List Patient Active Problem List   Diagnosis Date Noted  . Moderate aortic stenosis 01/08/2018  . Ascending aorta dilatation (HCC) 01/08/2018  . Mixed hyperlipidemia 01/08/2018  . History of recent stroke 01/08/2018  . Hypoalbuminemia due to protein-calorie malnutrition ([place])   . Essential hypertension   . Cerebrovascular accident (CVA) due to occlusion of left posterior communicating artery ([place]) 12/10/2017  . Cerebrovascular accident (CVA) due to thrombosis of left posterior cerebral artery ([place])   . Benign essential HTN   . Morbid obesity ([place])   . OSA (obstructive sleep apnea)   . Enteritis due to Clostridium difficile   . Acute CVA (cerebrovascular accident) ([place]) 12/01/2017  . Acute arterial ischemic stroke, multifocal, posterior circulation ([place]) 12/01/2017    Quay Burow, OTR/L 01/31/2018, 5:08 PM  [facility] [address] [place],  [place], [address] Phone: [phone]   Fax:  [phone]  Name: William Lowery MRN: [id number] Date of Birth: 1965-12-06

## 2018-01-31 NOTE — Therapy (Signed)
[facility] [address] [place], [place], [address] Phone: [phone]   Fax:  [phone]  Speech Language Pathology Treatment  Patient Details  Name: William Lowery MRN: [id number] Date of Birth: 11/11/1965 Referring Provider: Dr. Alger Simons   Encounter Date: 01/31/2018  End of Session - 01/31/18 1458    Visit Number  4    Number of Visits  17    Date for SLP Re-Evaluation  03/29/18    SLP Start Time  9798    SLP Stop Time   1446    SLP Time Calculation (min)  43 min    Activity Tolerance  Patient tolerated treatment well       Past Medical History:  Diagnosis Date  . Asthma    hx of  . Heart murmur    as an infant  . Hypertension   . Sleep apnea    wears CPAP    Past Surgical History:  Procedure Laterality Date  . bellybutton procedure     as a newborn  . IR ANGIO INTRA EXTRACRAN SEL COM CAROTID INNOMINATE BILAT MOD SED  12/01/2017  . IR ANGIO VERTEBRAL SEL VERTEBRAL UNI R MOD SED  12/01/2017  . IR CT HEAD LTD  12/01/2017  . IR INTRA CRAN STENT  12/01/2017  . IR PERCUTANEOUS ART THROMBECTOMY/INFUSION INTRACRANIAL INC DIAG ANGIO  12/01/2017  . RADIOLOGY WITH ANESTHESIA N/A 12/01/2017   Procedure: RADIOLOGY WITH ANESTHESIA CODE STROKE;  Surgeon: Radiologist, Medication, MD;  Location: [facility];  Service: Radiology;  Laterality: N/A;  . TEE WITHOUT CARDIOVERSION N/A 12/06/2017   Procedure: TRANSESOPHAGEAL ECHOCARDIOGRAM (TEE);  Surgeon: Pixie Casino, MD;  Location: [facility] ENDOSCOPY;  Service: Cardiovascular;  Laterality: N/A;    There were no vitals filed for this visit.  Subjective Assessment - 01/31/18 1407    Subjective  "She said I had trouble telling the story - but she didn't let me read it:    Currently in Pain?  No/denies            ADULT SLP TREATMENT - 01/31/18 1408      General Information   Behavior/Cognition  Alert;Pleasant mood;Cooperative      Treatment Provided   Treatment  provided  Cognitive-Linquistic      Cognitive-Linquistic Treatment   Treatment focused on  Cognition;Patient/family/caregiver education    Skilled Treatment  Alternating attention on moderately complex 3 pile card sort with extened time and occasional min A to attend to cards in hand and all 3 piles/rules. As sort progress, pt improved with error awareness with occasional min A. Pt performed alternating attention task with cards for 20 minutes, however he did not respond to simple conversational questions interjected during sort, and pt required cues to attend to questions/conversation., Organization facilitated with functional math reasoning/problem solving of moderately complex task, however pt demonstrated frustration and difficulty of task was reduced with success.       Assessment / Recommendations / Plan   Plan  Continue with current plan of care;Goals updated      Progression Toward Goals   Progression toward goals  Progressing toward goals       SLP Education - 01/31/18 1455    Education provided  Yes    Education Details  attention strategies, cognitive activities to do at home    Person(s) Educated  Patient;Spouse    Methods  Explanation;Demonstration    Comprehension  Verbalized understanding;Returned demonstration       SLP Short Term Goals -  01/31/18 1458      SLP SHORT TERM GOAL #1   Title  pt will use compensations for anomia in 10 minutes mod complex conversation     Time  3    Period  Weeks    Status  On-going      SLP SHORT TERM GOAL #2   Title  pt will name 8 items in mod abstract/complex category with rare min A     Time  3    Period  Weeks    Status  On-going      SLP SHORT TERM GOAL #3   Title  pt will participate in further assessment of language (reading, writing) and cognition and goals added PRN    Time  1    Period  Weeks    Status  Achieved      SLP SHORT TERM GOAL #4   Title  pt will demo alternating attention on simple cognitive linguistic  tasks for 15 minutes in quiet environment     Time  3    Period  Weeks    Status  New      SLP SHORT TERM GOAL #5   Title  Pt will perform min-mod complex organization/planning/sequencing/finance tasks with 85% accuracy and rare min A     Time  3    Period  Weeks    Status  New       SLP Long Term Goals - 01/31/18 1458      SLP LONG TERM GOAL #1   Title  pt will demo functional verbal output in 10 minutes complex conversation over three sessions    Time  7    Period  Weeks    Status  On-going      SLP LONG TERM GOAL #2   Time  7    Period  Weeks    Status  On-going      SLP LONG TERM GOAL #3   Title  pt will demo alternating attention on mod complex cognitive linguistic tasks for 20 minutes in a mildly distracting environment    Time  7    Period  Weeks    Status  New      SLP LONG TERM GOAL #4   Title  pt will complete mod complex-complex organization/planning/sequencing tasks with 90% success over three sessions    Time  7    Period  Weeks    Status  New      SLP LONG TERM GOAL #5   Title  pt will ID errors in cognitive linguistic tasks in 90% of opportunities.    Time  7    Period  Weeks    Status  New       Plan - 01/31/18 1456    Clinical Impression Statement  Pt required extended time and min A for alternating attention today. Error awareness with min to mod A. 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 Continue skilled ST to maximize independence and QOL    Speech Therapy Frequency  2x / week    Treatment/Interventions  Cognitive reorganization;Multimodal communcation approach;Compensatory strategies;Language facilitation;Cueing hierarchy;Internal/external aids;Functional tasks;SLP instruction and feedback;Patient/family education    Potential to Achieve Goals  Good    Consulted and Agree with Plan of Care  Patient;Family member/caregiver       Patient will benefit from skilled therapeutic intervention in order  to improve the following deficits and impairments:   Cognitive communication deficit    Problem  List Patient Active Problem List   Diagnosis Date Noted  . Moderate aortic stenosis 01/08/2018  . Ascending aorta dilatation (HCC) 01/08/2018  . Mixed hyperlipidemia 01/08/2018  . History of recent stroke 01/08/2018  . Hypoalbuminemia due to protein-calorie malnutrition ([place])   . Essential hypertension   . Cerebrovascular accident (CVA) due to occlusion of left posterior communicating artery ([place]) 12/10/2017  . Cerebrovascular accident (CVA) due to thrombosis of left posterior cerebral artery ([place])   . Benign essential HTN   . Morbid obesity ([place])   . OSA (obstructive sleep apnea)   . Enteritis due to Clostridium difficile   . Acute CVA (cerebrovascular accident) ([place]) 12/01/2017  . Acute arterial ischemic stroke, multifocal, posterior circulation ([place]) 12/01/2017    Lovvorn, Annye Rusk MS, CCC-SLP 01/31/2018, 2:59 PM  [facility] [address] [place], [place], [address] Phone: [phone]   Fax:  [phone]   Name: Neythan Kozlov MRN: [id number] Date of Birth: 1965-12-28

## 2018-02-04 ENCOUNTER — Encounter: Payer: Self-pay | Admitting: Occupational Therapy

## 2018-02-04 ENCOUNTER — Ambulatory Visit: Payer: PRIVATE HEALTH INSURANCE | Admitting: Speech Pathology

## 2018-02-04 ENCOUNTER — Ambulatory Visit: Payer: PRIVATE HEALTH INSURANCE | Admitting: Physical Therapy

## 2018-02-04 ENCOUNTER — Ambulatory Visit: Payer: PRIVATE HEALTH INSURANCE | Attending: Physical Medicine & Rehabilitation | Admitting: Occupational Therapy

## 2018-02-04 DIAGNOSIS — I69351 Hemiplegia and hemiparesis following cerebral infarction affecting right dominant side: Secondary | ICD-10-CM | POA: Diagnosis present

## 2018-02-04 DIAGNOSIS — R41841 Cognitive communication deficit: Secondary | ICD-10-CM | POA: Diagnosis present

## 2018-02-04 DIAGNOSIS — R41842 Visuospatial deficit: Secondary | ICD-10-CM | POA: Diagnosis present

## 2018-02-04 DIAGNOSIS — I69318 Other symptoms and signs involving cognitive functions following cerebral infarction: Secondary | ICD-10-CM | POA: Insufficient documentation

## 2018-02-04 DIAGNOSIS — R278 Other lack of coordination: Secondary | ICD-10-CM

## 2018-02-04 DIAGNOSIS — R2681 Unsteadiness on feet: Secondary | ICD-10-CM | POA: Insufficient documentation

## 2018-02-04 DIAGNOSIS — M6281 Muscle weakness (generalized): Secondary | ICD-10-CM | POA: Diagnosis not present

## 2018-02-04 DIAGNOSIS — R2689 Other abnormalities of gait and mobility: Secondary | ICD-10-CM | POA: Diagnosis present

## 2018-02-04 NOTE — Therapy (Signed)
[facility] [address] [place], [place], [address] Phone: [phone]   Fax:  [phone]  Occupational Therapy Treatment  Patient Details  Name: William Lowery MRN: [id number] Date of Birth: 04-May-1966 Referring Provider: Dr. Alger Simons   Encounter Date: 02/04/2018  OT End of Session - 02/04/18 1646    Visit Number  7    Number of Visits  16    Date for OT Re-Evaluation  02/26/18 date adjusted as pt missed one week of therapy    Authorization Type  self pay    OT Start Time  1317    OT Stop Time  1400    OT Time Calculation (min)  43 min    Activity Tolerance  Patient tolerated treatment well       Past Medical History:  Diagnosis Date  . Asthma    hx of  . Heart murmur    as an infant  . Hypertension   . Sleep apnea    wears CPAP    Past Surgical History:  Procedure Laterality Date  . bellybutton procedure     as a newborn  . IR ANGIO INTRA EXTRACRAN SEL COM CAROTID INNOMINATE BILAT MOD SED  12/01/2017  . IR ANGIO VERTEBRAL SEL VERTEBRAL UNI R MOD SED  12/01/2017  . IR CT HEAD LTD  12/01/2017  . IR INTRA CRAN STENT  12/01/2017  . IR PERCUTANEOUS ART THROMBECTOMY/INFUSION INTRACRANIAL INC DIAG ANGIO  12/01/2017  . RADIOLOGY WITH ANESTHESIA N/A 12/01/2017   Procedure: RADIOLOGY WITH ANESTHESIA CODE STROKE;  Surgeon: Radiologist, Medication, MD;  Location: [facility];  Service: Radiology;  Laterality: N/A;  . TEE WITHOUT CARDIOVERSION N/A 12/06/2017   Procedure: TRANSESOPHAGEAL ECHOCARDIOGRAM (TEE);  Surgeon: Pixie Casino, MD;  Location: [facility] ENDOSCOPY;  Service: Cardiovascular;  Laterality: N/A;    There were no vitals filed for this visit.  Subjective Assessment - 02/04/18 1320    Subjective   You want me to do that with my right hand?    Patient is accompained by:  Family member wife    Pertinent History  12/01/2017 L PCA CVA     Patient Stated Goals  I hope to get as much of my right hand working again  as I can    Currently in Pain?  No/denies                   OT Treatments/Exercises (OP) - 02/04/18 0001      Neurological Re-education Exercises   Other Exercises 1  Utilized constraint induced approach today for simple cooking activity - initially pt continued to try and use L hand for majority of task however as session progressed pt began to use R hand more spontaneously. Due to apraxia, pt needs mod cues for hand orientation with tasks. Pt benefits from cues to look at his hand -"is that the way you would normally do that?"  WIth questioning cues, pt able to approrpriately orient hand 75% of the time. If pt is unable then can carry over after demonstration or one trial of hand over hand.  Educated both pt and wife on how functional tasks for RUE will help improve functional use of R hand as the task will dictate the demands as well as give immediate feedback on whether or not pt is successful. Pt issued ace wrap and instructed to use at least 2-3 hours per day at home when doing tasks.  Wife educated in questioning cues vs telling pt  how to do task or doing for the patient.  Both will need continued education.              OT Education - 02/04/18 1642    Education provided  Yes    Education Details  modified constraint induced approach to use at home 2-3 hours daily    Person(s) Educated  Patient;Spouse    Methods  Explanation;Demonstration    Comprehension  Verbalized understanding;Returned demonstration;Need further instruction       OT Short Term Goals - 02/04/18 1643      OT SHORT TERM GOAL #1   Title  Pt and wife will be mod I with HEP for coordination, RUE strength and cognition - due 01/22/2018    Status  Achieved      OT SHORT TERM GOAL #2   Title  Pt will demonstrate improved gross control of RUE as evidenced by increasing by at least 5 blocks on Box and Blocks to assist with basic ADL's    Status  Achieved 01/22/2018  23 blocks      OT SHORT TERM GOAL #3    Title  Pt will be mod I with bathing at shower level using AE for back prn    Status  Partially Met wife still wishes to provide distant supevision      OT SHORT TERM GOAL #4   Title  Pt will be mod I with dressing    Status  Achieved      OT SHORT TERM GOAL #5   Title  Pt will be mod I with toilet hygiene    Status  Achieved      OT SHORT TERM GOAL #6   Title  Pt will demonstrate inmproved grip strength by at least 5 pounds to assist with basic ADL and IADL tasks (baseline =20 pounds)    Status  Achieved 01/22/2018  32 pounds      OT SHORT TERM GOAL #7   Title  Pt will use RUE as stabilizer during ADL and IADL tasks    Status  Achieved        OT Long Term Goals - 02/04/18 1643      OT LONG TERM GOAL #1   Title  Pt will be mod I with upgraded HEP for cognition and RUE funcitonal use - 02/26/2018 (date adjusted as pt missed one week of therapy)    Status  On-going      OT LONG TERM GOAL #2   Title  Pt will demonstrate improved gross motor control of RUE as evidenced by improving by at least 20 blocks on BOx and blocks to assist with IADL's    Status  On-going      OT LONG TERM GOAL #3   Title  Pt will demonstrate improved grip strength by at least  20 pounds to assist with IADL tasks ( baseline= 20 pounds)    Status  On-going      OT LONG TERM GOAL #4   Title  Pt will demonstrate ability for basic problem solving and organization with familiar functional task.    Status  On-going      OT LONG TERM GOAL #5   Title  Pt will demonstrate ability to write name with 100% legibility    Status  On-going      OT LONG TERM GOAL #6   Title  Pt will demonstrate ability for environmental scanning during physical task with 90% accuracy.    Status  On-going  OT LONG TERM GOAL #7   Title  Pt will demonstrate ability to complete functional task on outdoor uneven surfaces involving basic problem solving and organization.     Status  On-going            Plan - 02/04/18  1645    Clinical Impression Statement  Pt progressing toward goals. Pt with improved functional use during session using modified constraint induced approach.     Occupational Profile and client history currently impacting functional performance  PMH: morbid obesity, HTN, sleep apnea, small remote R cerebellar CVA, asthma. Pt with loop recorder!!!    Occupational performance deficits (Please refer to evaluation for details):  ADL's;IADL's;Work;Leisure;Social Participation    Rehab Potential  Good    Current Impairments/barriers affecting progress:  apraxia, ataxia, intention tremor    OT Frequency  2x / week    OT Duration  8 weeks    OT Treatment/Interventions  Self-care/ADL training;DME and/or AE instruction;Neuromuscular education;Therapeutic exercise;Therapist, nutritional;Therapeutic activities;Cognitive remediation/compensation;Visual/perceptual remediation/compensation;Patient/family education;Balance training    Plan  check how modified constraint induced approach is working at home and incorporate into sessions, NMR for RUE funtional use including weight bearing and closed chain activities, balance, incorporate cognition , functional use of R hand, writing, assess eating?    Consulted and Agree with Plan of Care  Patient;Family member/caregiver    Family Member Consulted  wife [name]       Patient will benefit from skilled therapeutic intervention in order to improve the following deficits and impairments:  Abnormal gait, Decreased activity tolerance, Decreased balance, Decreased coordination, Decreased cognition, Decreased knowledge of use of DME, Decreased mobility, Decreased safety awareness, Difficulty walking, Decreased strength, Impaired UE functional use, Impaired sensation, Impaired vision/preception, Obesity  Visit Diagnosis: Muscle weakness (generalized)  Unsteadiness on feet  Hemiplegia and hemiparesis following cerebral infarction affecting right dominant side  (HCC)  Other lack of coordination  Other symptoms and signs involving cognitive functions following cerebral infarction  Visuospatial deficit    Problem List Patient Active Problem List   Diagnosis Date Noted  . Moderate aortic stenosis 01/08/2018  . Ascending aorta dilatation (HCC) 01/08/2018  . Mixed hyperlipidemia 01/08/2018  . History of recent stroke 01/08/2018  . Hypoalbuminemia due to protein-calorie malnutrition ([place])   . Essential hypertension   . Cerebrovascular accident (CVA) due to occlusion of left posterior communicating artery ([place]) 12/10/2017  . Cerebrovascular accident (CVA) due to thrombosis of left posterior cerebral artery ([place])   . Benign essential HTN   . Morbid obesity ([place])   . OSA (obstructive sleep apnea)   . Enteritis due to Clostridium difficile   . Acute CVA (cerebrovascular accident) ([place]) 12/01/2017  . Acute arterial ischemic stroke, multifocal, posterior circulation ([place]) 12/01/2017    Quay Burow, OTR/L 02/04/2018, 4:51 PM  [facility] [address] [place], [place], [address] Phone: [phone]   Fax:  [phone]  Name: Tarik Teixeira MRN: [id number] Date of Birth: 15-Nov-1965

## 2018-02-04 NOTE — Therapy (Signed)
[facility] [address] [place], [place], [address] Phone: [phone]   Fax:  [phone]  Speech Language Pathology Treatment  Patient Details  Name: William Lowery MRN: [id number] Date of Birth: 19-Dec-1965 Referring Provider: Dr. Alger Simons   Encounter Date: 02/04/2018  End of Session - 02/04/18 1741    Visit Number  5    Number of Visits  17    Date for SLP Re-Evaluation  03/29/18    SLP Start Time  1446    SLP Stop Time   1529    SLP Time Calculation (min)  43 min    Activity Tolerance  Patient tolerated treatment well       Past Medical History:  Diagnosis Date  . Asthma    hx of  . Heart murmur    as an infant  . Hypertension   . Sleep apnea    wears CPAP    Past Surgical History:  Procedure Laterality Date  . bellybutton procedure     as a newborn  . IR ANGIO INTRA EXTRACRAN SEL COM CAROTID INNOMINATE BILAT MOD SED  12/01/2017  . IR ANGIO VERTEBRAL SEL VERTEBRAL UNI R MOD SED  12/01/2017  . IR CT HEAD LTD  12/01/2017  . IR INTRA CRAN STENT  12/01/2017  . IR PERCUTANEOUS ART THROMBECTOMY/INFUSION INTRACRANIAL INC DIAG ANGIO  12/01/2017  . RADIOLOGY WITH ANESTHESIA N/A 12/01/2017   Procedure: RADIOLOGY WITH ANESTHESIA CODE STROKE;  Surgeon: Radiologist, Medication, MD;  Location: [facility];  Service: Radiology;  Laterality: N/A;  . TEE WITHOUT CARDIOVERSION N/A 12/06/2017   Procedure: TRANSESOPHAGEAL ECHOCARDIOGRAM (TEE);  Surgeon: Pixie Casino, MD;  Location: [facility] ENDOSCOPY;  Service: Cardiovascular;  Laterality: N/A;    There were no vitals filed for this visit.  Subjective Assessment - 02/04/18 1446    Subjective  "She gave me a really hard part of the math."    Currently in Pain?  No/denies            ADULT SLP TREATMENT - 02/04/18 1447      General Information   Behavior/Cognition  Alert;Pleasant mood;Cooperative    Patient Positioning  Upright in chair      Treatment Provided    Treatment provided  Cognitive-Linquistic      Pain Assessment   Pain Assessment  No/denies pain      Cognitive-Linquistic Treatment   Treatment focused on  Cognition;Patient/family/caregiver education    Skilled Treatment  Pt, wife requesting strategies pt can use to help him when he returns to work. SLP educated that at this time, pt is not yet ready for return to work due to deficits in attention, problem solving and organization. Encouraged focus on restorative therapy tasks and learning compensations for impairments, explained that as pt progresses we will continue to incorporate functional tasks at pt's level and work toward his goal of returning to work. Pt's conversational speech was functional during this 8 minutes simple conversation. SLP targeted alternating attention and organization during simple cognitive linguistic tasks. Extended time required, and particularly with multi-step tasks pt had several breakdowns in attention, requiring mod A. Noted mild frstration however pt persisted with task with some encouragement.       Assessment / Recommendations / Plan   Plan  Continue with current plan of care;Goals updated      Progression Toward Goals   Progression toward goals  Progressing toward goals       SLP Education - 02/04/18 1743  Education provided  Yes    Education Details  spend at least 30 minutes doing a cognitive activity each day at home; reduce environmental distractions to improve focus    Person(s) Educated  Patient;Spouse    Methods  Explanation    Comprehension  Verbalized understanding       SLP Short Term Goals - 02/04/18 1738      SLP SHORT TERM GOAL #1   Title  pt will use compensations for anomia in 10 minutes mod complex conversation     Time  2    Period  Weeks    Status  On-going      SLP SHORT TERM GOAL #2   Title  pt will name 8 items in mod abstract/complex category with rare min A     Time  2    Period  Weeks    Status  On-going      SLP  SHORT TERM GOAL #3   Title  pt will participate in further assessment of language (reading, writing) and cognition and goals added PRN    Status  Achieved      SLP SHORT TERM GOAL #4   Title  pt will demo alternating attention on simple cognitive linguistic tasks for 15 minutes in quiet environment     Time  2    Period  Weeks    Status  On-going      SLP SHORT TERM GOAL #5   Title  Pt will perform min-mod complex organization/planning/sequencing/finance tasks with 85% accuracy and rare min A     Time  2    Period  Weeks    Status  On-going       SLP Long Term Goals - 02/04/18 1740      SLP LONG TERM GOAL #1   Title  pt will demo functional verbal output in 10 minutes complex conversation over three sessions    Time  6    Period  Weeks    Status  On-going      SLP LONG TERM GOAL #2   Title  in 15 minutes mod complex/complex conversation pt will ask questions or comment appropriately to demo understanding, over 3 sessions    Time  6    Period  Weeks    Status  On-going      SLP LONG TERM GOAL #3   Title  pt will demo alternating attention on mod complex cognitive linguistic tasks for 20 minutes in a mildly distracting environment    Time  6    Period  Weeks    Status  On-going      SLP LONG TERM GOAL #4   Title  pt will complete mod complex-complex organization/planning/sequencing tasks with 90% success over three sessions    Time  6    Period  Weeks    Status  On-going      SLP LONG TERM GOAL #5   Title  pt will ID errors in cognitive linguistic tasks in 90% of opportunities.    Time  6    Period  Weeks    Status  On-going       Plan - 02/04/18 1741    Clinical Impression Statement  Pt required extended time and min A for alternating attention today. Conversational speech was functional in 8 minutes simple conversation. Error awareness with min to mod A. 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 Continue skilled ST to  maximize independence and QOL  Speech Therapy Frequency  2x / week    Treatment/Interventions  Cognitive reorganization;Multimodal communcation approach;Compensatory strategies;Language facilitation;Cueing hierarchy;Internal/external aids;Functional tasks;SLP instruction and feedback;Patient/family education    Potential to Achieve Goals  Good    Consulted and Agree with Plan of Care  Patient;Family member/caregiver       Patient will benefit from skilled therapeutic intervention in order to improve the following deficits and impairments:   Cognitive communication deficit    Problem List Patient Active Problem List   Diagnosis Date Noted  . Moderate aortic stenosis 01/08/2018  . Ascending aorta dilatation (HCC) 01/08/2018  . Mixed hyperlipidemia 01/08/2018  . History of recent stroke 01/08/2018  . Hypoalbuminemia due to protein-calorie malnutrition ([place])   . Essential hypertension   . Cerebrovascular accident (CVA) due to occlusion of left posterior communicating artery ([place]) 12/10/2017  . Cerebrovascular accident (CVA) due to thrombosis of left posterior cerebral artery ([place])   . Benign essential HTN   . Morbid obesity ([place])   . OSA (obstructive sleep apnea)   . Enteritis due to Clostridium difficile   . Acute CVA (cerebrovascular accident) ([place]) 12/01/2017  . Acute arterial ischemic stroke, multifocal, posterior circulation ([place]) 12/01/2017   Deneise Lever, [place], CCC-SLP Speech-Language Pathologist  Aliene Altes 02/04/2018, 5:57 PM  [facility] [address] [place], [place], [address] Phone: [phone]   Fax:  [phone]   Name: Armonie Staten MRN: [id number] Date of Birth: 1966/10/23

## 2018-02-05 ENCOUNTER — Ambulatory Visit
Admission: RE | Admit: 2018-02-05 | Discharge: 2018-02-05 | Disposition: A | Discharge status: Home / Self Care | Payer: No Typology Code available for payment source | Source: Ambulatory Visit | Attending: Internal Medicine | Admitting: Internal Medicine

## 2018-02-05 ENCOUNTER — Encounter: Payer: Self-pay | Admitting: Neurology

## 2018-02-05 ENCOUNTER — Encounter: Payer: Self-pay | Admitting: Physical Therapy

## 2018-02-05 ENCOUNTER — Ambulatory Visit (INDEPENDENT_AMBULATORY_CARE_PROVIDER_SITE_OTHER): Payer: No Typology Code available for payment source | Admitting: Neurology

## 2018-02-05 VITALS — BP 125/88 | HR 66 | Ht 70.0 in | Wt 283.0 lb

## 2018-02-05 DIAGNOSIS — I10 Essential (primary) hypertension: Secondary | ICD-10-CM

## 2018-02-05 DIAGNOSIS — E782 Mixed hyperlipidemia: Secondary | ICD-10-CM

## 2018-02-05 DIAGNOSIS — I639 Cerebral infarction, unspecified: Secondary | ICD-10-CM | POA: Diagnosis not present

## 2018-02-05 DIAGNOSIS — E041 Nontoxic single thyroid nodule: Secondary | ICD-10-CM

## 2018-02-05 DIAGNOSIS — G4733 Obstructive sleep apnea (adult) (pediatric): Secondary | ICD-10-CM | POA: Diagnosis not present

## 2018-02-05 DIAGNOSIS — I77810 Thoracic aortic ectasia: Secondary | ICD-10-CM

## 2018-02-05 DIAGNOSIS — I63332 Cerebral infarction due to thrombosis of left posterior cerebral artery: Secondary | ICD-10-CM

## 2018-02-05 DIAGNOSIS — I350 Nonrheumatic aortic (valve) stenosis: Secondary | ICD-10-CM

## 2018-02-05 NOTE — Patient Instructions (Addendum)
-   continue ASA and brilinta for stroke prevention and for intracranial stent - follow up with Dr. Estanislado Pandy next week and ask the duration of brilinta - follow up with Dr. Debara Pickett for repeat heart ultrasound and aorta aneurysm.  - follow up with PCP for right thyroid nodule - Follow up with your primary care physician for stroke risk factor modification. Recommend maintain blood pressure goal <130/80, diabetes with hemoglobin A1c goal below 7.0% and lipids with LDL cholesterol goal below 70 mg/dL.  - continue PT/OT/speech - continue follow up with Dr. Ranae Plumber for work and driving clearance.  - continue use CPAP for sleepy apnea.  - healthy diet and regular exercise and lose weight - continue to observe diarrhea and follow up with Dr. Shelia Media - I will do some research work on the occipital hair loss and let you know.  - follow up in 3 months with Janett Billow.

## 2018-02-05 NOTE — Progress Notes (Signed)
STROKE NEUROLOGY FOLLOW UP NOTE  NAME: William Lowery DOB: [birth year]  REASON FOR VISIT: stroke follow up HISTORY FROM: wife and pt and chart  Today we had the pleasure of seeing William Lowery in follow-up at our [facility]rology [facility]. Pt was accompanied by wife.   History Summary William Lowery is a 52 y.o. male with history of asthma, hypertension, and OSA on CPAP admitted on 12/01/17 for right-sided numbness. The patient received TPA. CTA head and neck showed left P1 occlusion. Had IR with TICI2b reperfusion and s/p left P1 stent. MRI showed left PCA, right SCA and b/l PICA infarcts, as well as remote right PICA infarct. MRA showed left PCA occlusion even after stenting, and right SCA occlusion. EF 55-60% on TTE. No DVT. Hypercoagulable work up neg. However, TEE showed AV calcified atheroma vs. Vegetation. CTA chest and aorta showed  4.3 ascending thoracic aortic aneurysm but AV atheroma or vegetation were not well seen. LDL 58 and A1C 5.7. Blood culture neg. Cardiology recommend 30 day cardiac event monitoring instead of loop recorder. Pt was discharged to CIR on ASA and brilinta as well as lipitor 10.   Pt also had C. Diff during hospitalization. ID consulted and put on po vancomycin for 2 weeks and bowel supplement with yogurt.   Interval History During the interval time, the patient has been doing well. Still has right sided ataxia and dexterity difficulty with right hand. Had outpt PT/OT/speech. Not able to back to work or driving yet. Follows with Dr. Ranae Plumber in [place]. He also followed with Dr. Debara Pickett and considered AV calcified atheroma and did not recommend further cardiac monitoring. Will repeat TTE in one year. Has not seen Dr. Estanislado Pandy yet. On ASA and brilinta without side effect. Will has thyroid US today to follow up with right thyroid nodule found on CTA neck. BP today 125/88.   REVIEW OF SYSTEMS: Full 14 system review of systems performed and notable only for those listed below and in  HPI above, all others are negative:  Constitutional:   Cardiovascular:  Ear/Nose/Throat:   Skin:  Eyes:   Respiratory:   Gastroitestinal:   Genitourinary:  Hematology/Lymphatic:   Endocrine:  Musculoskeletal:   Allergy/Immunology:   Neurological:   Psychiatric:  Sleep:   The following represents the patient's updated allergies and side effects list: No Known Allergies  The neurologically relevant items on the patient's problem list were reviewed on today's visit.  Neurologic Examination  A problem focused neurological exam (12 or more points of the single system neurologic examination, vital signs counts as 1 point, cranial nerves count for 8 points) was performed.  Blood pressure 125/88, pulse 66, height 5\' 10"  (1.778 m), weight 283 lb (128.4 kg).  General - Well nourished, well developed, in no apparent distress.  Ophthalmologic - Fundi not visualized due to eye movement.  Cardiovascular - Regular rate and rhythm  Head - alopicia at occipital region  Mental Status -  Level of arousal and orientation to time, place, and person were intact. Language including expression, naming, repetition, comprehension was assessed and found intact. Fund of Knowledge was assessed and was intact.  Cranial Nerves II - XII - II - Visual field intact except RUQ decreased visual acuity and simultagnosia. III, IV, VI - Extraocular movements intact. V - Facial sensation intact bilaterally. VII - Facial movement intact bilaterally. VIII - Hearing & vestibular intact bilaterally. X - Palate elevates symmetrically. XI - Chin turning & shoulder shrug intact bilaterally. XII - Tongue protrusion  intact.  Motor Strength - The patient's strength was normal in all extremities except decreased right hand dexterity and pronator drift was absent.  Bulk was normal and fasciculations were absent.   Motor Tone - Muscle tone was assessed at the neck and appendages and was normal.  Reflexes - The  patient's reflexes were 1+ in all extremities and he had no pathological reflexes.  Sensory - Light touch, temperature/pinprick were assessed and were normal.    Coordination - The patient had ataxia at right arm and leg, arm > leg.   Gait and Station - broad based gait   Functional score  mRS = 2   0 - No symptoms.   1 - No significant disability. Able to carry out all usual activities, despite some symptoms.   2 - Slight disability. Able to look after own affairs without assistance, but unable to carry out all previous activities.   3 - Moderate disability. Requires some help, but able to walk unassisted.   4 - Moderately severe disability. Unable to attend to own bodily needs without assistance, and unable to walk unassisted.   5 - Severe disability. Requires constant nursing care and attention, bedridden, incontinent.   6 - Dead.   NIH Stroke Scale   Level Of Consciousness 0=Alert; keenly responsive 1=Not alert, but arousable by minor stimulation 2=Not alert, requires repeated stimulation 3=Responds only with reflex movements 0  LOC Questions to Month and Age 72=Answers both questions correctly 1=Answers one question correctly 2=Answers neither question correctly 0  LOC Commands      -Open/Close eyes     -Open/close grip 0=Performs both tasks correctly 1=Performs one task correctly 2=Performs neighter task correctly 0  Best Gaze 0=Normal 1=Partial gaze palsy 2=Forced deviation, or total gaze paresis 0  Visual 0=No visual loss 1=Partial hemianopia 2=Complete hemianopia 3=Bilateral hemianopia (blind including cortical blindness) 1  Facial Palsy 0=Normal symmetrical movement 1=Minor paralysis (asymmetry) 2=Partial paralysis (lower face) 3=Complete paralysis (upper and lower face) 0  Motor  0=No drift, limb holds posture for full 10 seconds 1=Drift, limb holds posture, no drift to bed 2=Some antigravity effort, cannot maintain posture, drifts to bed 3=No  effort against gravity, limb falls 4=No movement Right Arm 0     Leg 0    Left Arm 0     Leg 0  Limb Ataxia 0=Absent 1=Present in one limb 2=Present in two limbs 2  Sensory 0=Normal 1=Mild to moderate sensory loss 2=Severe to total sensory loss 0  Best Language 0=No aphasia, normal 1=Mild to moderate aphasia 2=Mute, global aphasia 3=Mute, global aphasia 0  Dysarthria 0=Normal 1=Mild to moderate 2=Severe, unintelligible or mute/anarthric 0  Extinction/Neglect 0=No abnormality 1=Extinction to bilateral simultaneous stimulation 2=Profound neglect 1  Total   4     Data reviewed: I personally reviewed the images and agree with the radiology interpretations.  Ct Angio Head W Or Wo Contrast Ct Angio Neck W Or Wo Contrast  Ct Cerebral Perfusion W Contrast 12/01/2017 IMPRESSION:  1. 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 14 cc of left PCA distribution penumbra with no infarct by CTP.  2. 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 No embolic source identified in the head or neck. No atheromatous changes or chronic stenoses.  3. 2.5 cm right thyroid nodule, recommend sonographic follow-up.   Ct Head Wo Contrast 12/01/2017 IMPRESSION:  1. Subarachnoid hyperdensity within the posterior left temporal lobe and left occipital lobe, which could be contrast staining or petechial/subarachnoid hemorrhage.  2.  No herniation, hydrocephalus or intraparenchymal hematoma.   Ct Head Code Stroke Wo Contrast 12/01/2017 IMPRESSION:  1. Possible hyperdense left P1 segment.  2. Negative for hemorrhage or visible acute infarct.  3. Small remote right inferior cerebellar infarct.   Mri and Mra Head Wo Contrast 12/03/2017 IMPRESSION: 1. Multifocal acute ischemia within the posterior circulation territories, predominantly within the left PCA distribution, including the left occipital lobe and left thalamus. Numerous smaller foci of acute ischemia throughout both cerebellar  hemispheres. 2. Minimal susceptibility blooming in the left occipital lobe, likely indicating the presence of a minimal amount of petechial blood. 3. Stent within the P1 segment of the left PCA without flow related enhancement distal to the stent. The stent may be occluded. This could be further assessed with CTA of the head, if clinically warranted.   CT HEAD  1. Continued decrease in subarachnoid high-density along the posterior left cerebral hemisphere. No hydrocephalus. 2. Moderate acute left occipital infarct without detected progression. 3. Question small right superior cerebellar infarct.  Transthoracic Echocardiogram - Left ventricle: The cavity size was normal. Wall thickness was increased in a pattern of mild LVH. Systolic function was normal. The estimated ejection fraction was in the range of 55% to 60%. Wall motion was normal; there were no regional wall motion abnormalities. Left ventricular diastolic function parameters were normal. - Aortic valve: Moderately calcified annulus. Trileaflet; moderately thickened leaflets. There was moderate stenosis. There was mild regurgitation. Mean gradient (S): 22 mm Hg. Valve area (VTI): 1.36 cm^2. Valve area (Vmax): 1.17 cm^2. Valve area (Vmean): 1.25 cm^2. - Mitral valve: Mildly calcified annulus. Mildly thickened leaflets  Cerebral angiogram - Dr. Estanislado Pandy 12/01/2017 S/P bilateral common carotid and vertebral artery angiograms,followed By partial revascularization of Lt PCA with x 2 passes with solitaire 58mm x 40 mm retriever device ,x 1 pass with the embotrap retriever device And placement of intracranial stent in the Lt PCA for recurrent occlusion after retriever passes achieving a TICI 2b re pertfusion,followe by .75 mg og aggrastat IA into the basilar artery with slow revascularization of intrastent occlusion.due to acute platelet aggregation. Also loaded with brilinta 180 mg and aspirin 81 mg via orogastric  tube prior to stent placement  PORTABLE CHEST 1 VIEW   12/05/2017 COMPARISON: 12/03/2017 and earlier. IMPRESSION: 1. Extubated and enteric tube removed. 2. No acute cardiopulmonary abnormality.  Lower Venous Study 12/04/2017 at 1:21:55 PM. Final Interpretation: Right: There is no evidence of deep vein thrombosis in the lower extremity.There is no evidence of superficial venous thrombosis. No cystic structure found in the popliteal fossa. Left: There is no evidence of deep vein thrombosis in the lower extremity.There is no evidence of superficial venous thrombosis. No cystic structure found in the popliteal fossa.  TEE IMPRESSION:  8. No LAA thrombus 9. Negative for PFO 10. 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 11. Trileaflet, but thickened aortic valve with moderate stenosis - mean gradient 29 mmHg 12. Moderate to severe LVH 13. LVEF 65-70% 14. Small late right to left shunt, suggestive of intrapulmonary or intrahepatic shunting 15. Ascending aortic aneurysm, measuring at least 4.1 cm  CT ANGIOGRAPHY CHEST WITH CONTRAST 12/06/2017 21:01 IMPRESSION: 4.3 cm ascending thoracic aortic aneurysm.  Recommend annual imaging followup by CTA or MRA. Small bilateral pleural effusions with dependent atelectasis.  Component     Latest Ref Rng & Units 12/02/2017 12/04/2017 12/05/2017  Cholesterol     0 - 200 mg/dL  144    Triglycerides     <150 mg/dL 246 (H)    HDL Cholesterol     >40 mg/dL 37 (L)    Total CHOL/HDL Ratio     RATIO 3.9    VLDL     0 - 40 mg/dL 49 (H)    LDL (calc)     0 - 99 mg/dL 58    Alpha galactosidase, serum     28.0 - 80.0 nmol/hr/mg prt  34.5   Interpretation       Comment   Director Review       Comment   Methodology       Comment   DISCLAIMER       Comment   PTT Lupus Anticoagulant     0.0 - 51.9 sec  36.4   DRVVT     0.0 - 47.0 sec  34.9   Lupus Anticoag  Interp       Comment:   Beta-2 Glycoprotein I Ab, IgG     0 - 20 GPI IgG units  <9   Beta-2-Glycoprotein I IgM     0 - 32 GPI IgM units  <9   Beta-2-Glycoprotein I IgA     0 - 25 GPI IgA units  <9   Anticardiolipin Ab,IgG,Qn     0 - 14 GPL U/mL  <9   Anticardiolipin Ab,IgM,Qn     0 - 12 MPL U/mL  11   Anticardiolipin Ab,IgA,Qn     0 - 11 APL U/mL  <9   Hemoglobin A1C     4.8 - 5.6 % 5.7 (H)    Mean Plasma Glucose     mg/dL 116.89    HIV Screen 4th Generation wRfx     Non Reactive Non Reactive    Homocysteine     0.0 - 15.0 umol/L  3.9   Vitamin B12     180 - 914 pg/mL  1,057 (H)   TSH     0.350 - 4.500 uIU/mL  0.288 (L)   RPR     Non Reactive  Non Reactive   ANA Ab, IFA       Negative   ds DNA Ab     0 - 9 IU/mL  <1   T4,Free(Direct)     0.61 - 1.12 ng/dL   0.80    Assessment: As you may recall, he is a 52 y.o. Caucasian male with PMH of asthma, hypertension, and OSA on CPAP admitted on 12/01/17 for right-sided numbness. s/p TPA. CTA head and neck showed left P1 occlusion. Had IR with TICI2b reperfusion and s/p left P1 stent. MRI showed left PCA, right SCA and b/l PICA infarcts, as well as remote right PICA infarct. MRA showed left PCA occlusion even after stenting, and right SCA occlusion. EF 55-60% on TTE. No DVT. Hypercoagulable work up neg. However, TEE showed AV calcified atheroma vs. Vegetation. CTA chest and aorta showed 4.3 ascending thoracic aortic aneurysm but AV atheroma or vegetation were not well seen. LDL 58 and A1C 5.7. Blood culture neg. Cardiology recommend 30 day cardiac event monitoring instead of loop recorder. Pt was discharged to CIR on ASA and brilinta as well as lipitor 10. Pt has been doing well after discharge. Still has right sided ataxia and dexterity difficulty with right hand. Had outpt PT/OT/speech. Followed with Dr. Debara Pickett and considered AV calcified atheroma as the cause of current and prior strokes, and did not recommend further cardiac monitoring.  Recommend to repeat TTE  in one year. Has not seen Dr. Estanislado Pandy yet. On ASA and brilinta without side effect.   Plan:  - continue ASA and brilinta for stroke prevention and for intracranial stent - follow up with Dr. Estanislado Pandy next week and ask the duration of brilinta - follow up with Dr. Debara Pickett for repeat heart ultrasound and aorta aneurysm.  - follow up with PCP for right thyroid nodule - Follow up with your primary care physician for stroke risk factor modification. Recommend maintain blood pressure goal <130/80, diabetes with hemoglobin A1c goal below 7.0% and lipids with LDL cholesterol goal below 70 mg/dL.  - continue PT/OT/speech - continue follow up with Dr. Ranae Plumber for work and driving clearance.  - continue use CPAP for sleepy apnea.  - healthy diet and regular exercise and lose weight - continue to observe diarrhea and follow up with Dr. Shelia Media - I will do some research work on the occipital hair loss and let you know.  - follow up in 3 months with Janett Billow.   No orders of the defined types were placed in this encounter.   No orders of the defined types were placed in this encounter.   Patient Instructions  - continue ASA and brilinta for stroke prevention and for intracranial stent - follow up with Dr. Estanislado Pandy next week and ask the duration of brilinta - follow up with Dr. Debara Pickett for repeat heart ultrasound and aorta aneurysm.  - follow up with PCP for right thyroid nodule - Follow up with your primary care physician for stroke risk factor modification. Recommend maintain blood pressure goal <130/80, diabetes with hemoglobin A1c goal below 7.0% and lipids with LDL cholesterol goal below 70 mg/dL.  - continue PT/OT/speech - continue follow up with Dr. Ranae Plumber for work and driving clearance.  - continue use CPAP for sleepy apnea.  - healthy diet and regular exercise and lose weight - continue to observe diarrhea and follow up with Dr. Shelia Media - I will do some research work on  the occipital hair loss and let you know.  - follow up in 3 months with Janett Billow.    Rosalin Hawking, MD PhD [facility] [address], [place], [place] [address] [phone]

## 2018-02-05 NOTE — Therapy (Signed)
[facility] [address] [place], [place], [address] Phone: [phone]   Fax:  [phone]  Physical Therapy Treatment  Patient Details  Name: William Lowery MRN: [id number] Date of Birth: 05-01-1966 Referring Provider: Dr. Alger Simons   Encounter Date: 02/04/2018  PT End of Session - 02/05/18 2155    Visit Number  9    Number of Visits  17    Date for PT Re-Evaluation  02/22/18    Authorization Type  Self Pay    PT Start Time  0488    PT Stop Time  1446    PT Time Calculation (min)  44 min       Past Medical History:  Diagnosis Date  . Asthma    hx of  . Heart murmur    as an infant  . Hypertension   . OSA (obstructive sleep apnea)   . Sleep apnea    wears CPAP  . Stroke [facility])     Past Surgical History:  Procedure Laterality Date  . bellybutton procedure     as a newborn  . IR ANGIO INTRA EXTRACRAN SEL COM CAROTID INNOMINATE BILAT MOD SED  12/01/2017  . IR ANGIO VERTEBRAL SEL VERTEBRAL UNI R MOD SED  12/01/2017  . IR CT HEAD LTD  12/01/2017  . IR INTRA CRAN STENT  12/01/2017  . IR PERCUTANEOUS ART THROMBECTOMY/INFUSION INTRACRANIAL INC DIAG ANGIO  12/01/2017  . RADIOLOGY WITH ANESTHESIA N/A 12/01/2017   Procedure: RADIOLOGY WITH ANESTHESIA CODE STROKE;  Surgeon: Radiologist, Medication, MD;  Location: [place];  Service: Radiology;  Laterality: N/A;  . TEE WITHOUT CARDIOVERSION N/A 12/06/2017   Procedure: TRANSESOPHAGEAL ECHOCARDIOGRAM (TEE);  Surgeon: Pixie Casino, MD;  Location[facility] ENDOSCOPY;  Service: Cardiovascular;  Laterality: N/A;    There were no vitals filed for this visit.  Subjective Assessment - 02/05/18 2148    Subjective  Pt states he just cooked an egg in OT session (prior to PT session)    Patient is accompained by:  Family member    Diagnostic tests  transesophageal echocardiogram 1-31-9    Patient Stated Goals  "I want to be able to move regular"    Currently in Pain?  No/denies                        [facility] Adult PT Treatment/Exercise - 02/05/18 0001      Ambulation/Gait   Ambulation/Gait  Yes    Ambulation/Gait Assistance  5: Supervision    Ambulation Distance (Feet)  250 Feet    Assistive device  None    Gait Pattern  Within Functional Limits    Ambulation Surface  Level;Indoor    Stairs  Yes    Stair Management Technique  Alternating pattern;Two rails;Forwards    Number of Stairs  4    Height of Stairs  6    Gait Comments  Practiced controlled descent with RLE with step descension - pt used LUE support on hand rail      High Level Balance   High Level Balance Activities  Marching forwards;Marching backwards;Tandem walking    High Level Balance Comments  pt also performed crossovers and stepping behind inside // bars - with RUE support progressing to no UE support inside // bars      TherAct:   Pt performed jumping inside // bars with bil. UE support - 10 reps with feet clearing floor with UE support 1/2 jumping jacks x 5  reps  NeuroRe-ed:  Pt performed stepping over and back of balance beam - 5 reps each leg Touching balance bubbles (3) in 1/2 circle with each foot for improved coordination and SLS - with min assist      PT Short Term Goals - 01/31/18 1319      PT SHORT TERM GOAL #1   Title  Increase Berg score from 41/56 to >/= 46/56 to reduce fall risk.    Baseline  01/31/18: BERG score 52/56 today.    Status  Achieved    Target Date  --      PT SHORT TERM GOAL #2   Title  Improve TUG score from 14.6 secs to </= 12.0 secs without device for improved functional mobility.    Baseline  14.6 secs with no device; 12.94 secs on 01-28-18    Status  Not Met    Target Date  --      PT SHORT TERM GOAL #3   Title  Increase distance in 3" walk test by at least 100' with use of RW to demo improved endurance/activity tolerance.    Baseline  01/31/18: 696 Feet today without AD to demo improved endurance/activity tolerance. (494 feet at  baseline)    Status  Achieved    Target Date  --      PT SHORT TERM GOAL #4   Title  Negotiate steps (4) with no rail using a step by step sequence with SBA.    Baseline  met 01-28-18    Status  Achieved    Target Date  --      PT SHORT TERM GOAL #5   Title  Independent in HEP for balance and RLE strengthening.    Baseline  01/31/2018: Pt verbalized independent in HEP for balance and RLE strengthening.     Status  Achieved    Target Date  --        PT Long Term Goals - 02/01/18 1011      PT LONG TERM GOAL #1   Title  Increase Berg score from 41/56 to >/= 51/56 to decr. fall risk.    Baseline  41/56 on 12-25-17    Time  8    Period  Weeks    Status  New      PT LONG TERM GOAL #2   Title  Pt will amb. 1000' without device modified independently on all surfaces.     Baseline  pt amb. with RW 100' with SBA    Time  8    Period  Weeks    Status  New      PT LONG TERM GOAL #3   Title  Negotiate 4 steps without rail using a step over step sequence.    Time  8    Period  Weeks    Status  New      PT LONG TERM GOAL #4   Title  Independent in updated HEP, including community fitness options for continued HEP.    Time  8    Period  Weeks    Status  New      PT LONG TERM GOAL #5   Title  Increase gait velocity from 2.92 ft/sec without device to >/= 3.7 ft/sec without device for incr. gait efficiency.    Baseline  2.92 ft/sec with no device on 12-25-17    Time  8    Period  Weeks    Status  New  Plan - 02/05/18 2156    Clinical Impression Statement  Pt is progressing well with balance and gait; gait pattern is improving with less ataxia; RLE continues to have slightly decreased coordination compared to LLE    Rehab Potential  Good    PT Frequency  2x / week    PT Duration  8 weeks    PT Treatment/Interventions  ADLs/Self Care Home Management;Stair training;Gait training;DME Instruction;Therapeutic activities;Therapeutic exercise;Balance  training;Neuromuscular re-education;Patient/family education    PT Next Visit Plan  Continue with dynamic gait, step downs, and coordination activities for RLE.    PT Home Exercise Plan  balance HEP and walking program    Consulted and Agree with Plan of Care  Patient;Family member/caregiver    Family Member Consulted  wife [name]       Patient will benefit from skilled therapeutic intervention in order to improve the following deficits and impairments:  Abnormal gait, Cardiopulmonary status limiting activity, Decreased activity tolerance, Decreased balance, Decreased cognition, Decreased coordination, Decreased endurance, Decreased strength  Visit Diagnosis: Other abnormalities of gait and mobility  Other lack of coordination  Unsteadiness on feet     Problem List Patient Active Problem List   Diagnosis Date Noted  . Moderate aortic stenosis 01/08/2018  . Ascending aorta dilatation (HCC) 01/08/2018  . Mixed hyperlipidemia 01/08/2018  . History of recent stroke 01/08/2018  . Hypoalbuminemia due to protein-calorie malnutrition ([place])   . Essential hypertension   . Cerebrovascular accident (CVA) due to occlusion of left posterior communicating artery ([place]) 12/10/2017  . Cerebrovascular accident (CVA) due to thrombosis of left posterior cerebral artery ([place])   . Benign essential HTN   . Morbid obesity ([place])   . OSA (obstructive sleep apnea)   . Enteritis due to Clostridium difficile   . Acute CVA (cerebrovascular accident) ([place]) 12/01/2017  . Acute arterial ischemic stroke, multifocal, posterior circulation ([place]) 12/01/2017    Philopateer Strine, [place], PT 02/05/2018, 10:03 PM  [facility] [address] [place], [place], [address] Phone: [phone]   Fax:  [phone]  Name: William Lowery MRN: [id number] Date of Birth: 1966-02-06

## 2018-02-07 ENCOUNTER — Ambulatory Visit: Payer: PRIVATE HEALTH INSURANCE | Admitting: Physical Therapy

## 2018-02-07 ENCOUNTER — Ambulatory Visit: Payer: PRIVATE HEALTH INSURANCE | Admitting: Speech Pathology

## 2018-02-07 ENCOUNTER — Ambulatory Visit: Payer: PRIVATE HEALTH INSURANCE | Admitting: Occupational Therapy

## 2018-02-07 ENCOUNTER — Encounter: Payer: Self-pay | Admitting: Occupational Therapy

## 2018-02-07 DIAGNOSIS — R41842 Visuospatial deficit: Secondary | ICD-10-CM

## 2018-02-07 DIAGNOSIS — R2681 Unsteadiness on feet: Secondary | ICD-10-CM

## 2018-02-07 DIAGNOSIS — R2689 Other abnormalities of gait and mobility: Secondary | ICD-10-CM

## 2018-02-07 DIAGNOSIS — I69318 Other symptoms and signs involving cognitive functions following cerebral infarction: Secondary | ICD-10-CM

## 2018-02-07 DIAGNOSIS — M6281 Muscle weakness (generalized): Secondary | ICD-10-CM

## 2018-02-07 DIAGNOSIS — R278 Other lack of coordination: Secondary | ICD-10-CM

## 2018-02-07 DIAGNOSIS — I69351 Hemiplegia and hemiparesis following cerebral infarction affecting right dominant side: Secondary | ICD-10-CM

## 2018-02-07 NOTE — Therapy (Signed)
[facility] [address] [place], [place], [address] Phone: [phone]   Fax:  [phone]  Occupational Therapy Treatment  Patient Details  Name: William Lowery MRN: [id number] Date of Birth: 04-09-1966 Referring Provider: Dr. Alger Simons   Encounter Date: 02/07/2018  OT End of Session - 02/07/18 1649    Visit Number  8    Number of Visits  16    Date for OT Re-Evaluation  02/26/18    Authorization Type  self pay    OT Start Time  3546    OT Stop Time  1359    OT Time Calculation (min)  42 min    Activity Tolerance  Patient tolerated treatment well       Past Medical History:  Diagnosis Date  . Asthma    hx of  . Heart murmur    as an infant  . Hypertension   . OSA (obstructive sleep apnea)   . Sleep apnea    wears CPAP  . Stroke [facility])     Past Surgical History:  Procedure Laterality Date  . bellybutton procedure     as a newborn  . IR ANGIO INTRA EXTRACRAN SEL COM CAROTID INNOMINATE BILAT MOD SED  12/01/2017  . IR ANGIO VERTEBRAL SEL VERTEBRAL UNI R MOD SED  12/01/2017  . IR CT HEAD LTD  12/01/2017  . IR INTRA CRAN STENT  12/01/2017  . IR PERCUTANEOUS ART THROMBECTOMY/INFUSION INTRACRANIAL INC DIAG ANGIO  12/01/2017  . RADIOLOGY WITH ANESTHESIA N/A 12/01/2017   Procedure: RADIOLOGY WITH ANESTHESIA CODE STROKE;  Surgeon: Radiologist, Medication, MD;  Location: [facility];  Service: Radiology;  Laterality: N/A;  . TEE WITHOUT CARDIOVERSION N/A 12/06/2017   Procedure: TRANSESOPHAGEAL ECHOCARDIOGRAM (TEE);  Surgeon: Pixie Casino, MD;  Location: [facility] ENDOSCOPY;  Service: Cardiovascular;  Laterality: N/A;    There were no vitals filed for this visit.  Subjective Assessment - 02/07/18 1319    Patient is accompained by:  Family member wife    Pertinent History  12/01/2017 L PCA CVA     Patient Stated Goals  I hope to get as much of my right hand working again as I can    Currently in Pain?  No/denies                    OT Treatments/Exercises (OP) - 02/07/18 0001      ADLs   Driving  Reviewed driving with recommendation for neuro opthamology eval prior to driving eval.  Also reviewed driving eval  process in detail and provided pt and wife with referral information. Pt and wife able to verbalize understanding.        Neurological Re-education Exercises   Other Exercises 1  Neuro re ed using functional familiar bilateral tasks to address functional use of R dominant hand.  Focused on hand orientation, full use of R hand in task, questioning cues for pt to focus on performance of R hand, and signficant task repetition.                 OT Short Term Goals - 02/07/18 1646      OT SHORT TERM GOAL #1   Title  Pt and wife will be mod I with HEP for coordination, RUE strength and cognition - due 01/22/2018    Status  Achieved      OT SHORT TERM GOAL #2   Title  Pt will demonstrate improved gross control of RUE as evidenced by  increasing by at least 5 blocks on Box and Blocks to assist with basic ADL's    Status  Achieved 01/22/2018  23 blocks      OT SHORT TERM GOAL #3   Title  Pt will be mod I with bathing at shower level using AE for back prn    Status  Partially Met wife still wishes to provide distant supevision      OT SHORT TERM GOAL #4   Title  Pt will be mod I with dressing    Status  Achieved      OT SHORT TERM GOAL #5   Title  Pt will be mod I with toilet hygiene    Status  Achieved      OT SHORT TERM GOAL #6   Title  Pt will demonstrate inmproved grip strength by at least 5 pounds to assist with basic ADL and IADL tasks (baseline =20 pounds)    Status  Achieved 01/22/2018  32 pounds      OT SHORT TERM GOAL #7   Title  Pt will use RUE as stabilizer during ADL and IADL tasks    Status  Achieved        OT Long Term Goals - 02/07/18 1647      OT LONG TERM GOAL #1   Title  Pt will be mod I with upgraded HEP for cognition and RUE funcitonal use -  02/26/2018 (date adjusted as pt missed one week of therapy)    Status  On-going      OT LONG TERM GOAL #2   Title  Pt will demonstrate improved gross motor control of RUE as evidenced by improving by at least 20 blocks on BOx and blocks to assist with IADL's    Status  On-going      OT LONG TERM GOAL #3   Title  Pt will demonstrate improved grip strength by at least  20 pounds to assist with IADL tasks ( baseline= 20 pounds)    Status  On-going      OT LONG TERM GOAL #4   Title  Pt will demonstrate ability for basic problem solving and organization with familiar functional task.    Status  On-going      OT LONG TERM GOAL #5   Title  Pt will demonstrate ability to write name with 100% legibility    Status  On-going      OT LONG TERM GOAL #6   Title  Pt will demonstrate ability for environmental scanning during physical task with 90% accuracy.    Status  On-going      OT LONG TERM GOAL #7   Title  Pt will demonstrate ability to complete functional task on outdoor uneven surfaces involving basic problem solving and organization.     Status  On-going            Plan - 02/07/18 1648    Clinical Impression Statement  Pt progressing toward goals. Pt with improving functional use of R hand when cued to attend to hand and with repetition.     Occupational Profile and client history currently impacting functional performance  PMH: morbid obesity, HTN, sleep apnea, small remote R cerebellar CVA, asthma. Pt with loop recorder!!!    Occupational performance deficits (Please refer to evaluation for details):  ADL's;IADL's;Work;Leisure;Social Participation    Rehab Potential  Good    Current Impairments/barriers affecting progress:  apraxia, ataxia, intention tremor    OT Frequency  2x / week  OT Duration  8 weeks    OT Treatment/Interventions  Self-care/ADL training;DME and/or AE instruction;Neuromuscular education;Therapeutic exercise;Therapist, nutritional;Therapeutic  activities;Cognitive remediation/compensation;Visual/perceptual remediation/compensation;Patient/family education;Balance training    Plan  continue to encourage modified constraint induced approach is working at home and incorporate into sessions, NMR for RUE funtional use including weight bearing and closed chain activities, balance, incorporate cognition , functional use of R hand, writing, assess eating?    Consulted and Agree with Plan of Care  Patient;Family member/caregiver    Family Member Consulted  wife [name]       Patient will benefit from skilled therapeutic intervention in order to improve the following deficits and impairments:  Abnormal gait, Decreased activity tolerance, Decreased balance, Decreased coordination, Decreased cognition, Decreased knowledge of use of DME, Decreased mobility, Decreased safety awareness, Difficulty walking, Decreased strength, Impaired UE functional use, Impaired sensation, Impaired vision/preception, Obesity  Visit Diagnosis: Other lack of coordination  Unsteadiness on feet  Muscle weakness (generalized)  Hemiplegia and hemiparesis following cerebral infarction affecting right dominant side (HCC)  Other symptoms and signs involving cognitive functions following cerebral infarction  Visuospatial deficit    Problem List Patient Active Problem List   Diagnosis Date Noted  . Moderate aortic stenosis 01/08/2018  . Ascending aorta dilatation (HCC) 01/08/2018  . Mixed hyperlipidemia 01/08/2018  . History of recent stroke 01/08/2018  . Hypoalbuminemia due to protein-calorie malnutrition ([place])   . Essential hypertension   . Cerebrovascular accident (CVA) due to occlusion of left posterior communicating artery ([place]) 12/10/2017  . Cerebrovascular accident (CVA) due to thrombosis of left posterior cerebral artery ([place])   . Benign essential HTN   . Morbid obesity ([place])   . OSA (obstructive sleep apnea)   . Enteritis due to Clostridium difficile    . Acute CVA (cerebrovascular accident) ([place]) 12/01/2017  . Acute arterial ischemic stroke, multifocal, posterior circulation ([place]) 12/01/2017    Quay Burow, OTR/L 02/07/2018, 4:51 PM  [facility] [address] [place], [place], [address] Phone: [phone]   Fax:  [phone]  Name: Holland Nickson MRN: [id number] Date of Birth: Nov 21, 1965

## 2018-02-07 NOTE — Patient Instructions (Signed)
Local Driver Evaluation Programs: ° °Comprehensive Evaluation: includes clinical and in vehicle behind the wheel testing by OCCUPATIONAL THERAPIST. Programs have varying levels of adaptive controls available for trial.  ° °[facility] °[address] °[place], [place]  [address] °[phone] or [phone] °http://www.driver-rehab.com °Evaluator:  Cyndee Crompton, OT/CDRS/CDI/SCDCM/Low Vision Certification ° °[facility] °[address] °[place], [place] [address] °[phone] °https://www.[facility].org/home/services/rehabilitation.aspx °Evaluators:  Shannon Sheek, OT and Jill Tucker, OT ° °[facility] [place] [facility] (ONLY SERVES VETERANS!!) °[facility] °[address] °[place], [place]  [address] °[phone] [phone] °http://www.[place].va.gov/services/Physical_Medicine_Rehabilitation_Services.asp °Evaluators:  Eric Andrews, KT; Heidi Harris, KT;  Gary Whitaker, KT (KT=kiniesotherapist) ° ° °Clinical evaluations only:  Includes clinical testing, refers to other programs or local certified driving instructor for behind the wheel testing. ° °[facility] (outpatient Rehab) °[facility] °[address] °[place], [place] [address] °[phone] for scheduling °http://www.[facility].edu/Outpatient-Rehabilitation/Neurorehabilitation-Therapy.htm °Evaluators:  Kelly Lambeth, OT; Kate Phillips, OT ° °Other area clinical evaluators available upon request including [facility], [facility] and [facility]. ° ° °    Resource List °What is a Driver Evaluation: °Your Road Ahead - A Guide to Comprehensive Driving Evaluations °http://www.thehartford.com/resources/mature-market-excellence/publications-on-aging ° °Association for [employer] Rehabilitation Services - Disability and Driving Fact Sheets °http://www.aded.net/?page=510 ° °Driving after a Brain  Injury: °[employer] °http://www.biausa.org/tbims-abstracts/if-there-is-an-effective-way-to-determine-if-someone-is-ready-to-drive-after-tbi?A=SearchResult&SearchID=9495675&ObjectID=2758842&ObjectType=35 ° °Driving with Adaptive Equipment: °[facility] Process °http://www.driver-rehab.com/adaptive-equipment ° °[employer] Mob[employer] °http://www.nmeda.com/ ° ° ° ° ° ° °  °

## 2018-02-08 ENCOUNTER — Encounter: Payer: Self-pay | Admitting: Physical Therapy

## 2018-02-08 NOTE — Therapy (Signed)
[facility] [address] [place], [place], [address] Phone: [phone]   Fax:  [phone]  Physical Therapy Treatment  Patient Details  Name: William Lowery MRN: [id number] Date of Birth: 1966/10/09 Referring Provider: Dr. Alger Simons   Encounter Date: 02/07/2018  PT End of Session - 02/08/18 1418    Visit Number  10    Number of Visits  17    Date for PT Re-Evaluation  02/22/18    Authorization Type  Self Pay    PT Start Time  7412    PT Stop Time  8786    PT Time Calculation (min)  43 min       Past Medical History:  Diagnosis Date  . Asthma    hx of  . Heart murmur    as an infant  . Hypertension   . OSA (obstructive sleep apnea)   . Sleep apnea    wears CPAP  . Stroke [facility])     Past Surgical History:  Procedure Laterality Date  . bellybutton procedure     as a newborn  . IR ANGIO INTRA EXTRACRAN SEL COM CAROTID INNOMINATE BILAT MOD SED  12/01/2017  . IR ANGIO VERTEBRAL SEL VERTEBRAL UNI R MOD SED  12/01/2017  . IR CT HEAD LTD  12/01/2017  . IR INTRA CRAN STENT  12/01/2017  . IR PERCUTANEOUS ART THROMBECTOMY/INFUSION INTRACRANIAL INC DIAG ANGIO  12/01/2017  . RADIOLOGY WITH ANESTHESIA N/A 12/01/2017   Procedure: RADIOLOGY WITH ANESTHESIA CODE STROKE;  Surgeon: Radiologist, Medication, MD;  Location: [facility];  Service: Radiology;  Laterality: N/A;  . TEE WITHOUT CARDIOVERSION N/A 12/06/2017   Procedure: TRANSESOPHAGEAL ECHOCARDIOGRAM (TEE);  Surgeon: Pixie Casino, MD;  Location: [facility] ENDOSCOPY;  Service: Cardiovascular;  Laterality: N/A;    There were no vitals filed for this visit.  Subjective Assessment - 02/08/18 1412    Subjective  Pt reports he is doing well - no new problems or complaints - says no changes since previous session    Patient is accompained by:  Family member    Diagnostic tests  transesophageal echocardiogram 1-31-9    Patient Stated Goals  "I want to be able to move regular"     Currently in Pain?  No/denies                       [facility] Adult PT Treatment/Exercise - 02/08/18 0001      Ambulation/Gait   Ambulation/Gait  Yes    Ambulation/Gait Assistance  5: Supervision    Ambulation Distance (Feet)  100 Feet    Assistive device  None    Gait Pattern  Within Functional Limits    Ambulation Surface  Level;Indoor    Stairs  Yes    Stair Management Technique  Alternating pattern;Two rails;Forwards    Number of Stairs  4    Height of Stairs  6      High Level Balance   High Level Balance Comments  pt also performed crossovers and stepping behind inside // bars - with RUE support progressing to no UE support inside // bars braiding 10' x 4 reps with UE support to no UE support      Knee/Hip Exercises: Standing   Heel Raises  Both;1 set;10 reps    Other Standing Knee Exercises  Pt performed amb. on tip toes inside // bars with knees flexed 10' x 2 reps forward, 10' x 2 reps backwards  Balance Exercises - 02/08/18 1416      Balance Exercises: Standing   SLS  Eyes open;1 rep;10 secs on each foot - for 10 sec count with UE support prn    Rockerboard  Anterior/posterior;Lateral;EO;10 reps;Intermittent UE support       TherAct:  Pt performed jumping inside // bars - without UE support 5 reps; 1/2 jumping jacks x 5 reps with UE support- Progressing to no UE support; lunges x 5 reps with UE support, then 1 half jumping jack, 1 lunge x 5 reps  NeuroRe-ed; pt performed cone taps (4) with each foot, then tipping over and standing upright Pt performed coordination exercise with RLE - rolling blue medium sized ball up/back and laterally 5 reps each direction  With UE support 1 hand on // bar     PT Short Term Goals - 01/31/18 1319      PT SHORT TERM GOAL #1   Title  Increase Berg score from 41/56 to >/= 46/56 to reduce fall risk.    Baseline  01/31/18: BERG score 52/56 today.    Status  Achieved    Target Date  --      PT SHORT TERM  GOAL #2   Title  Improve TUG score from 14.6 secs to </= 12.0 secs without device for improved functional mobility.    Baseline  14.6 secs with no device; 12.94 secs on 01-28-18    Status  Not Met    Target Date  --      PT SHORT TERM GOAL #3   Title  Increase distance in 3" walk test by at least 100' with use of RW to demo improved endurance/activity tolerance.    Baseline  01/31/18: 696 Feet today without AD to demo improved endurance/activity tolerance. (494 feet at baseline)    Status  Achieved    Target Date  --      PT SHORT TERM GOAL #4   Title  Negotiate steps (4) with no rail using a step by step sequence with SBA.    Baseline  met 01-28-18    Status  Achieved    Target Date  --      PT SHORT TERM GOAL #5   Title  Independent in HEP for balance and RLE strengthening.    Baseline  01/31/2018: Pt verbalized independent in HEP for balance and RLE strengthening.     Status  Achieved    Target Date  --        PT Long Term Goals - 02/01/18 1011      PT LONG TERM GOAL #1   Title  Increase Berg score from 41/56 to >/= 51/56 to decr. fall risk.    Baseline  41/56 on 12-25-17    Time  8    Period  Weeks    Status  New      PT LONG TERM GOAL #2   Title  Pt will amb. 1000' without device modified independently on all surfaces.     Baseline  pt amb. with RW 100' with SBA    Time  8    Period  Weeks    Status  New      PT LONG TERM GOAL #3   Title  Negotiate 4 steps without rail using a step over step sequence.    Time  8    Period  Weeks    Status  New      PT LONG TERM GOAL #4   Title  Independent in updated HEP, including community fitness options for continued HEP.    Time  8    Period  Weeks    Status  New      PT LONG TERM GOAL #5   Title  Increase gait velocity from 2.92 ft/sec without device to >/= 3.7 ft/sec without device for incr. gait efficiency.    Baseline  2.92 ft/sec with no device on 12-25-17    Time  8    Period  Weeks    Status  New             Plan - 02/08/18 1419    Clinical Impression Statement  Pt continues to progress well towards goals;  continues to have slightly decreased coordination and strength in RLE compared to LLE; gait is improved with less ataxia noted    Rehab Potential  Good    PT Frequency  2x / week    PT Duration  8 weeks    PT Treatment/Interventions  ADLs/Self Care Home Management;Stair training;Gait training;DME Instruction;Therapeutic activities;Therapeutic exercise;Balance training;Neuromuscular re-education;Patient/family education    PT Next Visit Plan  Continue with dynamic gait, step downs, and coordination activities for RLE.    PT Home Exercise Plan  balance HEP and walking program    Consulted and Agree with Plan of Care  Patient;Family member/caregiver    Family Member Consulted  wife [name]       Patient will benefit from skilled therapeutic intervention in order to improve the following deficits and impairments:  Abnormal gait, Cardiopulmonary status limiting activity, Decreased activity tolerance, Decreased balance, Decreased cognition, Decreased coordination, Decreased endurance, Decreased strength  Visit Diagnosis: Other lack of coordination  Other abnormalities of gait and mobility  Muscle weakness (generalized)     Problem List Patient Active Problem List   Diagnosis Date Noted  . Moderate aortic stenosis 01/08/2018  . Ascending aorta dilatation (HCC) 01/08/2018  . Mixed hyperlipidemia 01/08/2018  . History of recent stroke 01/08/2018  . Hypoalbuminemia due to protein-calorie malnutrition ([place])   . Essential hypertension   . Cerebrovascular accident (CVA) due to occlusion of left posterior communicating artery ([place]) 12/10/2017  . Cerebrovascular accident (CVA) due to thrombosis of left posterior cerebral artery ([place])   . Benign essential HTN   . Morbid obesity ([place])   . OSA (obstructive sleep apnea)   . Enteritis due to Clostridium difficile   . Acute CVA  (cerebrovascular accident) ([place]) 12/01/2017  . Acute arterial ischemic stroke, multifocal, posterior circulation ([place]) 12/01/2017    William Lowery, [place], PT 02/08/2018, 2:23 PM  [facility] [address] [place], [place], [address] Phone: [phone]   Fax:  [phone]  Name: William Lowery MRN: [id number] Date of Birth: 04-19-1966

## 2018-02-11 ENCOUNTER — Encounter: Payer: Self-pay | Admitting: Occupational Therapy

## 2018-02-11 ENCOUNTER — Ambulatory Visit: Payer: PRIVATE HEALTH INSURANCE | Admitting: Speech Pathology

## 2018-02-11 ENCOUNTER — Ambulatory Visit: Payer: PRIVATE HEALTH INSURANCE | Admitting: Occupational Therapy

## 2018-02-11 ENCOUNTER — Ambulatory Visit: Payer: PRIVATE HEALTH INSURANCE | Admitting: Physical Therapy

## 2018-02-11 DIAGNOSIS — R2689 Other abnormalities of gait and mobility: Secondary | ICD-10-CM

## 2018-02-11 DIAGNOSIS — R41841 Cognitive communication deficit: Secondary | ICD-10-CM

## 2018-02-11 DIAGNOSIS — M6281 Muscle weakness (generalized): Secondary | ICD-10-CM

## 2018-02-11 DIAGNOSIS — I69351 Hemiplegia and hemiparesis following cerebral infarction affecting right dominant side: Secondary | ICD-10-CM

## 2018-02-11 DIAGNOSIS — I69318 Other symptoms and signs involving cognitive functions following cerebral infarction: Secondary | ICD-10-CM

## 2018-02-11 DIAGNOSIS — R41842 Visuospatial deficit: Secondary | ICD-10-CM

## 2018-02-11 DIAGNOSIS — R278 Other lack of coordination: Secondary | ICD-10-CM

## 2018-02-11 DIAGNOSIS — R2681 Unsteadiness on feet: Secondary | ICD-10-CM

## 2018-02-11 NOTE — Therapy (Signed)
[facility] [address] [place], [place], [address] Phone: [phone]   Fax:  [phone]  Speech Language Pathology Treatment  Patient Details  Name: William Lowery MRN: [id number] Date of Birth: 1966/10/02 Referring Provider: Dr. Alger Simons   Encounter Date: 02/11/2018  End of Session - 02/11/18 1714    Visit Number  6    Number of Visits  17    Date for SLP Re-Evaluation  03/29/18    SLP Start Time  1017    SLP Stop Time   1058    SLP Time Calculation (min)  41 min    Activity Tolerance  Patient tolerated treatment well       Past Medical History:  Diagnosis Date  . Asthma    hx of  . Heart murmur    as an infant  . Hypertension   . OSA (obstructive sleep apnea)   . Sleep apnea    wears CPAP  . Stroke [facility])     Past Surgical History:  Procedure Laterality Date  . bellybutton procedure     as a newborn  . IR ANGIO INTRA EXTRACRAN SEL COM CAROTID INNOMINATE BILAT MOD SED  12/01/2017  . IR ANGIO VERTEBRAL SEL VERTEBRAL UNI R MOD SED  12/01/2017  . IR CT HEAD LTD  12/01/2017  . IR INTRA CRAN STENT  12/01/2017  . IR PERCUTANEOUS ART THROMBECTOMY/INFUSION INTRACRANIAL INC DIAG ANGIO  12/01/2017  . RADIOLOGY WITH ANESTHESIA N/A 12/01/2017   Procedure: RADIOLOGY WITH ANESTHESIA CODE STROKE;  Surgeon: Radiologist, Medication, MD;  Location: [facility];  Service: Radiology;  Laterality: N/A;  . TEE WITHOUT CARDIOVERSION N/A 12/06/2017   Procedure: TRANSESOPHAGEAL ECHOCARDIOGRAM (TEE);  Surgeon: Pixie Casino, MD;  Location: [facility] ENDOSCOPY;  Service: Cardiovascular;  Laterality: N/A;    There were no vitals filed for this visit.  Subjective Assessment - 02/11/18 1023    Subjective  "I wasn't sure if I could use a calculator."    Patient is accompained by:  Family member    Currently in Pain?  No/denies            ADULT SLP TREATMENT - 02/11/18 1017      General Information   Behavior/Cognition   Alert;Pleasant mood;Cooperative    Patient Positioning  Upright in chair      Treatment Provided   Treatment provided  Cognitive-Linquistic      Pain Assessment   Pain Assessment  No/denies pain      Cognitive-Linquistic Treatment   Treatment focused on  Cognition;Patient/family/caregiver education    Skilled Treatment  Pt returns with homework partially completed; wife reported pt did not complete as he was feeling frustrated after completing 2 other sheets (1 partially complete). Planning/navigation task was incomplete; pt reattempted, however extended time required and pt noted to re-read instructions several times. SLP encouraged pt to break down instructions into components, and use visual cues to mark his place. Using this strategy, pt completed task accurately and within reasonable amount of time. Pt demo'd selective attention in moderate noise (voice therapy next door) for 12 minutes on min-mod complex organization/problem solving task, occasional min A for use of strategies for attention; pt distracted by interruption from wife asking SLP why pt is able to calculate tip and gas mileage quickly but has difficulty with therapy tasks. Pt with tangential speech, discussing areas to buy cheap gas in [place]. SLP redirected, and explained pt's decreased attention impacting his ability to process instructions and organize in  novel tasks. Educated that while pt may be able to make quick simple calculations, he is more likely to make mistakes, particularly if interrupted as frequently occurs in his work environment. SLP encouraged pt/wife to bring some of pt's work-related paperwork for SLP to incorporate functional tasks into alternating attention exercises.      Assessment / Recommendations / Plan   Plan  Continue with current plan of care;Goals updated      Progression Toward Goals   Progression toward goals  Progressing toward goals       SLP Education - 02/11/18 1713    Education  provided  Yes    Education Details  decreased attention impacting pt's ability to process and follow simple instructions    Person(s) Educated  Patient;Spouse    Methods  Explanation;Demonstration;Verbal cues    Comprehension  Verbalized understanding       SLP Short Term Goals - 02/11/18 1701      SLP SHORT TERM GOAL #1   Title  pt will use compensations for anomia in 10 minutes mod complex conversation     Time  2    Period  Weeks    Status  On-going      SLP SHORT TERM GOAL #2   Title  pt will name 8 items in mod abstract/complex category with rare min A     Time  2    Period  Weeks    Status  On-going      SLP SHORT TERM GOAL #3   Title  pt will participate in further assessment of language (reading, writing) and cognition and goals added PRN    Status  Achieved      SLP SHORT TERM GOAL #4   Title  pt will demo alternating attention on simple cognitive linguistic tasks for 15 minutes in quiet environment     Time  2    Period  Weeks    Status  On-going      SLP SHORT TERM GOAL #5   Title  Pt will perform min-mod complex organization/planning/sequencing/finance tasks with 85% accuracy and rare min A     Time  2    Period  Weeks    Status  On-going       SLP Long Term Goals - 02/11/18 1729      SLP LONG TERM GOAL #1   Title  pt will demo functional verbal output in 10 minutes complex conversation over three sessions    Time  6    Period  Weeks    Status  On-going      SLP LONG TERM GOAL #2   Title  in 15 minutes mod complex/complex conversation pt will ask questions or comment appropriately to demo understanding, over 3 sessions    Time  6    Period  Weeks    Status  On-going      SLP LONG TERM GOAL #3   Title  pt will demo alternating attention on mod complex cognitive linguistic tasks for 20 minutes in a mildly distracting environment    Time  6    Period  Weeks    Status  On-going      SLP LONG TERM GOAL #4   Title  pt will complete mod  complex-complex organization/planning/sequencing tasks with 90% success over three sessions    Time  6    Period  Weeks    Status  On-going      SLP LONG TERM GOAL #5  Title  pt will ID errors in cognitive linguistic tasks in 90% of opportunities.    Time  6    Period  Weeks    Status  On-going       Plan - 02/11/18 1729    Clinical Impression Statement   Pt continues to demonstrate mild aphasia, impaired attention, memory and executive function. Min cues required for selective attention in moderately distracting environment today, extended time for min-mod complex executive function tasks. Continue skilled ST to maximize independence and QOL.    Speech Therapy Frequency  2x / week    Potential to Achieve Goals  Good    Consulted and Agree with Plan of Care  Patient;Family member/caregiver       Patient will benefit from skilled therapeutic intervention in order to improve the following deficits and impairments:   Cognitive communication deficit    Problem List Patient Active Problem List   Diagnosis Date Noted  . Moderate aortic stenosis 01/08/2018  . Ascending aorta dilatation (HCC) 01/08/2018  . Mixed hyperlipidemia 01/08/2018  . History of recent stroke 01/08/2018  . Hypoalbuminemia due to protein-calorie malnutrition ([place])   . Essential hypertension   . Cerebrovascular accident (CVA) due to occlusion of left posterior communicating artery ([place]) 12/10/2017  . Cerebrovascular accident (CVA) due to thrombosis of left posterior cerebral artery ([place])   . Benign essential HTN   . Morbid obesity ([place])   . OSA (obstructive sleep apnea)   . Enteritis due to Clostridium difficile   . Acute CVA (cerebrovascular accident) ([place]) 12/01/2017  . Acute arterial ischemic stroke, multifocal, posterior circulation ([place]) 12/01/2017   Deneise Lever, MS, CCC-SLP Speech-Language Pathologist   Aliene Altes 02/11/2018, 5:30 PM  [facility] [address] [place], [place], [address] Phone: [phone]   Fax:  [phone]   Name: Granite Godman MRN: [id number] Date of Birth: 05-07-1966

## 2018-02-11 NOTE — Therapy (Signed)
[facility] [address] [place], [place], [address] Phone: [phone]   Fax:  [phone]  Occupational Therapy Treatment  Patient Details  Name: William Lowery MRN: [id number] Date of Birth: [birth year] Referring Provider: Dr. Alger Simons   Encounter Date: 02/11/2018  OT End of Session - 02/11/18 1313    Visit Number  9    Number of Visits  16    Date for OT Re-Evaluation  02/26/18    Authorization Type  self pay    OT Start Time  1148    OT Stop Time  1230    OT Time Calculation (min)  42 min    Activity Tolerance  Patient tolerated treatment well       Past Medical History:  Diagnosis Date  . Asthma    hx of  . Heart murmur    as an infant  . Hypertension   . OSA (obstructive sleep apnea)   . Sleep apnea    wears CPAP  . Stroke [facility])     Past Surgical History:  Procedure Laterality Date  . bellybutton procedure     as a newborn  . IR ANGIO INTRA EXTRACRAN SEL COM CAROTID INNOMINATE BILAT MOD SED  12/01/2017  . IR ANGIO VERTEBRAL SEL VERTEBRAL UNI R MOD SED  12/01/2017  . IR CT HEAD LTD  12/01/2017  . IR INTRA CRAN STENT  12/01/2017  . IR PERCUTANEOUS ART THROMBECTOMY/INFUSION INTRACRANIAL INC DIAG ANGIO  12/01/2017  . RADIOLOGY WITH ANESTHESIA N/A 12/01/2017   Procedure: RADIOLOGY WITH ANESTHESIA CODE STROKE;  Surgeon: Radiologist, Medication, MD;  Location: [facility];  Service: Radiology;  Laterality: N/A;  . TEE WITHOUT CARDIOVERSION N/A 12/06/2017   Procedure: TRANSESOPHAGEAL ECHOCARDIOGRAM (TEE);  Surgeon: Pixie Casino, MD;  Location: [facility] ENDOSCOPY;  Service: Cardiovascular;  Laterality: N/A;    There were no vitals filed for this visit.  Subjective Assessment - 02/11/18 1148    Subjective   This tremor really gets in the way    Patient is accompained by:  Family member wife    Pertinent History  12/01/2017 L PCA CVA     Patient Stated Goals  I hope to get as much of my right hand working again as  I can    Currently in Pain?  No/denies                   OT Treatments/Exercises (OP) - 02/11/18 0001      ADLs   ADL Comments  Checked goals - see goals for updated. Pt has had signficant improvement in grip strength and has improved some in Box and Blocks.  Pt also has improve motor planning.        Neurological Re-education Exercises   Other Exercises 1  Upgraded HEP for grip strength - see pt instruction.  Also reinforced use of modified constraint induced approach at home  vs just "trying not to use my left hand as much."  Wife reports that pt "gets upset with me when I tell him to use his L hand." Discussed that approach would elimnate need for wife to cue pt so much. Both verbalized understanding.  Tremor remains significant issue for functional use - note sent to physiatry as last note states he may mediate for tremor and pt now open to possibility. Wife to call physiatry in next 24 hours.               OT Education - 02/11/18 1310  Education provided  Yes    Education Details  Upgraded HEP for grip strength    Person(s) Educated  Patient;Spouse    Methods  Explanation;Demonstration;Handout    Comprehension  Verbalized understanding;Returned demonstration       OT Short Term Goals - 02/11/18 1311      OT SHORT TERM GOAL #1   Title  Pt and wife will be mod I with HEP for coordination, RUE strength and cognition - due 01/22/2018    Status  Achieved      OT SHORT TERM GOAL #2   Title  Pt will demonstrate improved gross control of RUE as evidenced by increasing by at least 5 blocks on Box and Blocks to assist with basic ADL's    Status  Achieved 01/22/2018  23 blocks      OT SHORT TERM GOAL #3   Title  Pt will be mod I with bathing at shower level using AE for back prn    Status  Partially Met wife still wishes to provide distant supevision      OT SHORT TERM GOAL #4   Title  Pt will be mod I with dressing    Status  Achieved      OT SHORT TERM GOAL #5    Title  Pt will be mod I with toilet hygiene    Status  Achieved      OT SHORT TERM GOAL #6   Title  Pt will demonstrate inmproved grip strength by at least 5 pounds to assist with basic ADL and IADL tasks (baseline =20 pounds)    Status  Achieved 01/22/2018  32 pounds      OT SHORT TERM GOAL #7   Title  Pt will use RUE as stabilizer during ADL and IADL tasks    Status  Achieved        OT Long Term Goals - 02/11/18 1311      OT LONG TERM GOAL #1   Title  Pt will be mod I with upgraded HEP for cognition and RUE funcitonal use - 02/26/2018 (date adjusted as pt missed one week of therapy)    Status  On-going      OT LONG TERM GOAL #2   Title  Pt will demonstrate improved gross motor control of RUE as evidenced by improving by at least 20 blocks on BOx and blocks to assist with IADL's (baseline = 12)    Status  On-going 02/11/2018  24 blocks      OT LONG TERM GOAL #3   Title  Pt will demonstrate improved grip strength by at least  20 pounds to assist with IADL tasks ( baseline= 20 pounds)    Status  Achieved 02/11/2018  56 pounds      OT LONG TERM GOAL #4   Title  Pt will demonstrate ability for basic problem solving and organization with familiar functional task.    Status  On-going      OT LONG TERM GOAL #5   Title  Pt will demonstrate ability to write name with 100% legibility    Status  On-going      OT LONG TERM GOAL #6   Title  Pt will demonstrate ability for environmental scanning during physical task with 90% accuracy.    Status  On-going      OT LONG TERM GOAL #7   Title  Pt will demonstrate ability to complete functional task on outdoor uneven surfaces involving basic problem solving and organization.  Status  On-going            Plan - 02/11/18 1312    Clinical Impression Statement  Pt progressing toward goals. Pt reports that he is engaging in several home mgmt tasks that require both hands.     Occupational Profile and client history currently impacting  functional performance  PMH: morbid obesity, HTN, sleep apnea, small remote R cerebellar CVA, asthma. Pt with loop recorder!!!    Occupational performance deficits (Please refer to evaluation for details):  ADL's;IADL's;Work;Leisure;Social Participation    Rehab Potential  Good    Current Impairments/barriers affecting progress:  apraxia, ataxia, intention tremor    OT Frequency  2x / week    OT Duration  8 weeks    OT Treatment/Interventions  Self-care/ADL training;DME and/or AE instruction;Neuromuscular education;Therapeutic exercise;Therapist, nutritional;Therapeutic activities;Cognitive remediation/compensation;Visual/perceptual remediation/compensation;Patient/family education;Balance training    Plan  continue to encourage modified constraint induced approach is working at home and incorporate into sessions, NMR for RUE funtional use including weight bearing and closed chain activities, balance, incorporate cognition , functional use of R hand, writing, assess eating?    Consulted and Agree with Plan of Care  Patient;Family member/caregiver    Family Member Consulted  wife [name]       Patient will benefit from skilled therapeutic intervention in order to improve the following deficits and impairments:  Abnormal gait, Decreased activity tolerance, Decreased balance, Decreased coordination, Decreased cognition, Decreased knowledge of use of DME, Decreased mobility, Decreased safety awareness, Difficulty walking, Decreased strength, Impaired UE functional use, Impaired sensation, Impaired vision/preception, Obesity  Visit Diagnosis: Other lack of coordination  Muscle weakness (generalized)  Unsteadiness on feet  Hemiplegia and hemiparesis following cerebral infarction affecting right dominant side (HCC)  Other symptoms and signs involving cognitive functions following cerebral infarction  Visuospatial deficit    Problem List Patient Active Problem List   Diagnosis Date Noted   . Moderate aortic stenosis 01/08/2018  . Ascending aorta dilatation (HCC) 01/08/2018  . Mixed hyperlipidemia 01/08/2018  . History of recent stroke 01/08/2018  . Hypoalbuminemia due to protein-calorie malnutrition ([place])   . Essential hypertension   . Cerebrovascular accident (CVA) due to occlusion of left posterior communicating artery ([place]) 12/10/2017  . Cerebrovascular accident (CVA) due to thrombosis of left posterior cerebral artery ([place])   . Benign essential HTN   . Morbid obesity ([place])   . OSA (obstructive sleep apnea)   . Enteritis due to Clostridium difficile   . Acute CVA (cerebrovascular accident) ([place]) 12/01/2017  . Acute arterial ischemic stroke, multifocal, posterior circulation ([place]) 12/01/2017    Quay Burow, OTR/L 02/11/2018, 1:14 PM  [facility] [address] [place], [place], [address] Phone: [phone]   Fax:  [phone]  Name: Bernard Slayden MRN: [id number] Date of Birth: 28-May-1966

## 2018-02-11 NOTE — Patient Instructions (Signed)
1. Grip Strengthening (Resistive Putty)  Transition to green putty   Squeeze putty using thumb and all fingers. Repeat _20___ times. Do __2__ sessions per day.   This week do 10 with green and 10 with red. Do 2 sessions per day. Next week do 15 with green , 5 with red. Do 2 sessions per day Following week do all with the green and ONLY USE RED FOR PINCHING!!   2. Roll putty into tube on table and pinch between each finger and thumb x 10 reps each. (can do ring and small finger together)  Use red putty.     Copyright  [employer]HI. All rights reserved.

## 2018-02-12 ENCOUNTER — Encounter: Payer: Self-pay | Admitting: Physical Therapy

## 2018-02-12 ENCOUNTER — Ambulatory Visit (HOSPITAL_COMMUNITY)
Admission: RE | Admit: 2018-02-12 | Discharge: 2018-02-12 | Disposition: A | Discharge status: Home / Self Care | Payer: Self-pay | Source: Ambulatory Visit | Attending: Interventional Radiology | Admitting: Interventional Radiology

## 2018-02-12 ENCOUNTER — Telehealth: Payer: Self-pay | Admitting: *Deleted

## 2018-02-12 DIAGNOSIS — I639 Cerebral infarction, unspecified: Secondary | ICD-10-CM

## 2018-02-12 HISTORY — PX: IR RADIOLOGIST EVAL & MGMT: IMG5224

## 2018-02-12 NOTE — Telephone Encounter (Signed)
Patient's wife called concerning a medication that may help with patient's 'intentional' tremors.  Patient was seen by Forde Radon on 02/11/2018 and they were told by the therapist that occupational therapy cannot help the tremors per se, but she informed that Dr. Charm Barges last clinic note indicated a discussion or plan to possibly medicate the tremors. If it is decided to prescribe, patient's wife requests that the medication

## 2018-02-12 NOTE — Telephone Encounter (Signed)
Patient's wife called concerning a medication that may help with patient's 'intentional' tremors.  Patient was seen by Forde Radon on 02/11/2018 and they were told by the therapist that occupational therapy cannot help the tremors per se, but she informed that Dr. Charm Barges last clinic note indicated a discussion or plan to possibly medicate the tremors.

## 2018-02-12 NOTE — Therapy (Signed)
[facility] [address] [place], [place], [address] Phone: [phone]   Fax:  [phone]  Physical Therapy Treatment  Patient Details  Name: William Lowery MRN: [id number] Date of Birth: 19-Oct-1966 Referring Provider: Dr. Alger Simons   Encounter Date: 02/11/2018  PT End of Session - 02/12/18 1729    Visit Number  11    Number of Visits  17    Date for PT Re-Evaluation  02/22/18    Authorization Type  Self Pay    PT Start Time  1103    PT Stop Time  1145    PT Time Calculation (min)  42 min       Past Medical History:  Diagnosis Date  . Asthma    hx of  . Heart murmur    as an infant  . Hypertension   . OSA (obstructive sleep apnea)   . Sleep apnea    wears CPAP  . Stroke [facility])     Past Surgical History:  Procedure Laterality Date  . bellybutton procedure     as a newborn  . IR ANGIO INTRA EXTRACRAN SEL COM CAROTID INNOMINATE BILAT MOD SED  12/01/2017  . IR ANGIO VERTEBRAL SEL VERTEBRAL UNI R MOD SED  12/01/2017  . IR CT HEAD LTD  12/01/2017  . IR INTRA CRAN STENT  12/01/2017  . IR PERCUTANEOUS ART THROMBECTOMY/INFUSION INTRACRANIAL INC DIAG ANGIO  12/01/2017  . IR RADIOLOGIST EVAL & MGMT  02/12/2018  . RADIOLOGY WITH ANESTHESIA N/A 12/01/2017   Procedure: RADIOLOGY WITH ANESTHESIA CODE STROKE;  Surgeon: Radiologist, Medication, MD;  Location: [facility];  Service: Radiology;  Laterality: N/A;  . TEE WITHOUT CARDIOVERSION N/A 12/06/2017   Procedure: TRANSESOPHAGEAL ECHOCARDIOGRAM (TEE);  Surgeon: Pixie Casino, MD;  Location: [facility] ENDOSCOPY;  Service: Cardiovascular;  Laterality: N/A;    There were no vitals filed for this visit.  Subjective Assessment - 02/12/18 1719    Subjective  Pt reports no new problems - states he is doing well with balance and walking - wife agrees    Patient is accompained by:  Family member wife, [name]    Diagnostic tests  transesophageal echocardiogram 1-31-9    Patient Stated  Goals  "I want to be able to move regular"    Currently in Pain?  No/denies                       [facility] Adult PT Treatment/Exercise - 02/12/18 0001      Ambulation/Gait   Ambulation/Gait  Yes    Ambulation/Gait Assistance  5: Supervision    Ambulation/Gait Assistance Details  increased Rt arm swing noted in gait    Ambulation Distance (Feet)  230 Feet    Assistive device  None    Gait Pattern  Within Functional Limits    Ambulation Surface  Level;Indoor    Stairs  Yes    Stair Management Technique  Alternating pattern;Forwards    Number of Stairs  4    Height of Stairs  6      High Level Balance   High Level Balance Activities  Side stepping;Braiding;Backward walking;Tandem walking;Marching forwards;Marching backwards      Knee/Hip Exercises: Standing   Heel Raises  Both;1 set;10 reps RLE only 10 reps           Balance Exercises - 02/12/18 1723      Balance Exercises: Standing   Rockerboard  Anterior/posterior;Lateral;EO;10 reps;Intermittent UE support    Other  Standing Exercises  Pt performed Rt hip and knee flexion - Rt knee extension to flexion back to stance position - 5 reps with UE support prn;  stepping over and back of balance beam with each foot 5 reps each standing on floor      Pt performed amb. Forward and backward 10'x 2 reps each direction  - inside // bars for gastroc strenghtening and for  Improved dynamic balance (no UE support used)  TherAct:  Pt performed jumping x 5 reps without UE support; 1/2 jumping jacks x 5 reps; lunges x 5 reps without UE support    PT Short Term Goals - 01/31/18 1319      PT SHORT TERM GOAL #1   Title  Increase Berg score from 41/56 to >/= 46/56 to reduce fall risk.    Baseline  01/31/18: BERG score 52/56 today.    Status  Achieved    Target Date  --      PT SHORT TERM GOAL #2   Title  Improve TUG score from 14.6 secs to </= 12.0 secs without device for improved functional mobility.    Baseline  14.6 secs  with no device; 12.94 secs on 01-28-18    Status  Not Met    Target Date  --      PT SHORT TERM GOAL #3   Title  Increase distance in 3" walk test by at least 100' with use of RW to demo improved endurance/activity tolerance.    Baseline  01/31/18: 696 Feet today without AD to demo improved endurance/activity tolerance. (494 feet at baseline)    Status  Achieved    Target Date  --      PT SHORT TERM GOAL #4   Title  Negotiate steps (4) with no rail using a step by step sequence with SBA.    Baseline  met 01-28-18    Status  Achieved    Target Date  --      PT SHORT TERM GOAL #5   Title  Independent in HEP for balance and RLE strengthening.    Baseline  01/31/2018: Pt verbalized independent in HEP for balance and RLE strengthening.     Status  Achieved    Target Date  --        PT Long Term Goals - 02/01/18 1011      PT LONG TERM GOAL #1   Title  Increase Berg score from 41/56 to >/= 51/56 to decr. fall risk.    Baseline  41/56 on 12-25-17    Time  8    Period  Weeks    Status  New      PT LONG TERM GOAL #2   Title  Pt will amb. 1000' without device modified independently on all surfaces.     Baseline  pt amb. with RW 100' with SBA    Time  8    Period  Weeks    Status  New      PT LONG TERM GOAL #3   Title  Negotiate 4 steps without rail using a step over step sequence.    Time  8    Period  Weeks    Status  New      PT LONG TERM GOAL #4   Title  Independent in updated HEP, including community fitness options for continued HEP.    Time  8    Period  Weeks    Status  New  PT LONG TERM GOAL #5   Title  Increase gait velocity from 2.92 ft/sec without device to >/= 3.7 ft/sec without device for incr. gait efficiency.    Baseline  2.92 ft/sec with no device on 12-25-17    Time  8    Period  Weeks    Status  New            Plan - 02/12/18 1730    Clinical Impression Statement  Pt is progressing well towards goals;  coordination RLE is improving with less  ataxia noted; plan D/C from PT next session - pt and wife pleased with pt's progress in PT and feel that pt is ready to D/C from PT    Rehab Potential  Good    PT Frequency  2x / week    PT Duration  8 weeks    PT Treatment/Interventions  ADLs/Self Care Home Management;Stair training;Gait training;DME Instruction;Therapeutic activities;Therapeutic exercise;Balance training;Neuromuscular re-education;Patient/family education    PT Next Visit Plan  check LTG's - D/C    PT Home Exercise Plan  balance HEP and walking program    Consulted and Agree with Plan of Care  Patient;Family member/caregiver    Family Member Consulted  wife [name]       Patient will benefit from skilled therapeutic intervention in order to improve the following deficits and impairments:  Abnormal gait, Cardiopulmonary status limiting activity, Decreased activity tolerance, Decreased balance, Decreased cognition, Decreased coordination, Decreased endurance, Decreased strength  Visit Diagnosis: Other lack of coordination  Unsteadiness on feet  Other abnormalities of gait and mobility     Problem List Patient Active Problem List   Diagnosis Date Noted  . Moderate aortic stenosis 01/08/2018  . Ascending aorta dilatation (HCC) 01/08/2018  . Mixed hyperlipidemia 01/08/2018  . History of recent stroke 01/08/2018  . Hypoalbuminemia due to protein-calorie malnutrition ([place])   . Essential hypertension   . Cerebrovascular accident (CVA) due to occlusion of left posterior communicating artery ([place]) 12/10/2017  . Cerebrovascular accident (CVA) due to thrombosis of left posterior cerebral artery ([place])   . Benign essential HTN   . Morbid obesity ([place])   . OSA (obstructive sleep apnea)   . Enteritis due to Clostridium difficile   . Acute CVA (cerebrovascular accident) ([place]) 12/01/2017  . Acute arterial ischemic stroke, multifocal, posterior circulation ([place]) 12/01/2017    William Lowery, [place], PT 02/12/2018, 5:36  PM  [facility] [address] [place], [place], [address] Phone: [phone]   Fax:  [phone]  Name: William Lowery MRN: [id number] Date of Birth: Nov 16, 1965

## 2018-02-14 ENCOUNTER — Ambulatory Visit: Payer: PRIVATE HEALTH INSURANCE | Admitting: Speech Pathology

## 2018-02-14 ENCOUNTER — Ambulatory Visit: Payer: PRIVATE HEALTH INSURANCE | Admitting: Occupational Therapy

## 2018-02-14 ENCOUNTER — Encounter: Payer: Self-pay | Admitting: Occupational Therapy

## 2018-02-14 ENCOUNTER — Other Ambulatory Visit: Payer: Self-pay | Admitting: Internal Medicine

## 2018-02-14 ENCOUNTER — Ambulatory Visit: Payer: PRIVATE HEALTH INSURANCE | Admitting: Physical Therapy

## 2018-02-14 DIAGNOSIS — R2681 Unsteadiness on feet: Secondary | ICD-10-CM

## 2018-02-14 DIAGNOSIS — I69351 Hemiplegia and hemiparesis following cerebral infarction affecting right dominant side: Secondary | ICD-10-CM

## 2018-02-14 DIAGNOSIS — I69318 Other symptoms and signs involving cognitive functions following cerebral infarction: Secondary | ICD-10-CM

## 2018-02-14 DIAGNOSIS — R278 Other lack of coordination: Secondary | ICD-10-CM

## 2018-02-14 DIAGNOSIS — R41841 Cognitive communication deficit: Secondary | ICD-10-CM

## 2018-02-14 DIAGNOSIS — M6281 Muscle weakness (generalized): Secondary | ICD-10-CM

## 2018-02-14 DIAGNOSIS — R2689 Other abnormalities of gait and mobility: Secondary | ICD-10-CM

## 2018-02-14 DIAGNOSIS — E042 Nontoxic multinodular goiter: Secondary | ICD-10-CM

## 2018-02-14 DIAGNOSIS — R41842 Visuospatial deficit: Secondary | ICD-10-CM

## 2018-02-14 MED ORDER — PROPRANOLOL HCL 10 MG PO TABS: 10.0000 mg | ORAL_TABLET | Freq: Three times a day (TID) | ORAL | 4 refills | Status: DC

## 2018-02-14 NOTE — Telephone Encounter (Signed)
Called to let pt know Propanolol was sent to his pharmacy and wife is aware.

## 2018-02-14 NOTE — Therapy (Signed)
[facility] [address] [place], [place], [address] Phone: [phone]   Fax:  [phone]  Occupational Therapy Treatment  Patient Details  Name: William Lowery MRN: [id number] Date of Birth: [birth year] Referring Provider: Dr. Alger Simons   Encounter Date: 02/14/2018  OT End of Session - 02/14/18 1230    Visit Number  10    Number of Visits  16    Date for OT Re-Evaluation  02/26/18    Authorization Type  self pay    OT Start Time  1017    OT Stop Time  1100    OT Time Calculation (min)  43 min    Activity Tolerance  Patient tolerated treatment well       Past Medical History:  Diagnosis Date  . Asthma    hx of  . Heart murmur    as an infant  . Hypertension   . OSA (obstructive sleep apnea)   . Sleep apnea    wears CPAP  . Stroke [facility])     Past Surgical History:  Procedure Laterality Date  . bellybutton procedure     as a newborn  . IR ANGIO INTRA EXTRACRAN SEL COM CAROTID INNOMINATE BILAT MOD SED  12/01/2017  . IR ANGIO VERTEBRAL SEL VERTEBRAL UNI R MOD SED  12/01/2017  . IR CT HEAD LTD  12/01/2017  . IR INTRA CRAN STENT  12/01/2017  . IR PERCUTANEOUS ART THROMBECTOMY/INFUSION INTRACRANIAL INC DIAG ANGIO  12/01/2017  . IR RADIOLOGIST EVAL & MGMT  02/12/2018  . RADIOLOGY WITH ANESTHESIA N/A 12/01/2017   Procedure: RADIOLOGY WITH ANESTHESIA CODE STROKE;  Surgeon: Radiologist, Medication, MD;  Location: [facility];  Service: Radiology;  Laterality: N/A;  . TEE WITHOUT CARDIOVERSION N/A 12/06/2017   Procedure: TRANSESOPHAGEAL ECHOCARDIOGRAM (TEE);  Surgeon: Pixie Casino, MD;  Location: [facility] ENDOSCOPY;  Service: Cardiovascular;  Laterality: N/A;    There were no vitals filed for this visit.  Subjective Assessment - 02/14/18 1022    Subjective   I guess I will the medication for the tremor    Patient is accompained by:  Family member wife    Pertinent History  12/01/2017 L PCA CVA     Patient Stated Goals  I  hope to get as much of my right hand working again as I can    Currently in Pain?  No/denies                   OT Treatments/Exercises (OP) - 02/14/18 0001      ADLs   Writing  Addresed pre writing skills with tracing shapes with marker with coban - pt with improvement since last assessed and pt's performance also improves with distraction. MD has ordered medication for tremor and pt agreeable to trial.        Neurological Re-education Exercises   Other Exercises 1  Neuro re ed to address multi system tasking including walking on outdoor uneven surface with UE demand as well a multi step cognitive demand. Pt able to complete with supevision for balance and only min difficulty for LUE control with gross motor bilateral task.                 OT Short Term Goals - 02/14/18 1228      OT SHORT TERM GOAL #1   Title  Pt and wife will be mod I with HEP for coordination, RUE strength and cognition - due 01/22/2018    Status  Achieved  OT SHORT TERM GOAL #2   Title  Pt will demonstrate improved gross control of RUE as evidenced by increasing by at least 5 blocks on Box and Blocks to assist with basic ADL's    Status  Achieved 01/22/2018  23 blocks      OT SHORT TERM GOAL #3   Title  Pt will be mod I with bathing at shower level using AE for back prn    Status  Partially Met wife still wishes to provide distant supevision      OT SHORT TERM GOAL #4   Title  Pt will be mod I with dressing    Status  Achieved      OT SHORT TERM GOAL #5   Title  Pt will be mod I with toilet hygiene    Status  Achieved      OT SHORT TERM GOAL #6   Title  Pt will demonstrate inmproved grip strength by at least 5 pounds to assist with basic ADL and IADL tasks (baseline =20 pounds)    Status  Achieved 01/22/2018  32 pounds      OT SHORT TERM GOAL #7   Title  Pt will use RUE as stabilizer during ADL and IADL tasks    Status  Achieved        OT Long Term Goals - 02/14/18 1228       OT LONG TERM GOAL #1   Title  Pt will be mod I with upgraded HEP for cognition and RUE funcitonal use - 02/26/2018 (date adjusted as pt missed one week of therapy)    Status  On-going      OT LONG TERM GOAL #2   Title  Pt will demonstrate improved gross motor control of RUE as evidenced by improving by at least 20 blocks on BOx and blocks to assist with IADL's (baseline = 12)    Status  On-going 02/11/2018  24 blocks      OT LONG TERM GOAL #3   Title  Pt will demonstrate improved grip strength by at least  20 pounds to assist with IADL tasks ( baseline= 20 pounds)    Status  Achieved 02/11/2018  56 pounds      OT LONG TERM GOAL #4   Title  Pt will demonstrate ability for basic problem solving and organization with familiar functional task.    Status  Achieved      OT LONG TERM GOAL #5   Title  Pt will demonstrate ability to write name with 100% legibility    Status  On-going      OT LONG TERM GOAL #6   Title  Pt will demonstrate ability for environmental scanning during physical task with 90% accuracy.    Status  Achieved      OT LONG TERM GOAL #7   Title  Pt will demonstrate ability to complete functional task on outdoor uneven surfaces involving basic problem solving and organization.     Status  Achieved            Plan - 02/14/18 1229    Clinical Impression Statement  Pt progressing toward goals. Pt with improved balance, UE functional use and multi tasking    Occupational Profile and client history currently impacting functional performance  PMH: morbid obesity, HTN, sleep apnea, small remote R cerebellar CVA, asthma. Pt with loop recorder!!!    Occupational performance deficits (Please refer to evaluation for details):  ADL's;IADL's;Work;Leisure;Social Participation    Rehab Potential  Good    Current Impairments/barriers affecting progress:  apraxia, ataxia, intention tremor    OT Frequency  2x / week    OT Duration  8 weeks    OT Treatment/Interventions  Self-care/ADL  training;DME and/or AE instruction;Neuromuscular education;Therapeutic exercise;Therapist, nutritional;Therapeutic activities;Cognitive remediation/compensation;Visual/perceptual remediation/compensation;Patient/family education;Balance training    Plan  continue to encourage modified constraint induced approach is working at home and incorporate into sessions, NMR for RUE funtional use including weight bearing and closed chain activities, balance, incorporate cognition , functional use of R hand, writing, assess eating?    Consulted and Agree with Plan of Care  Patient;Family member/caregiver    Family Member Consulted  wife [name]       Patient will benefit from skilled therapeutic intervention in order to improve the following deficits and impairments:  Abnormal gait, Decreased activity tolerance, Decreased balance, Decreased coordination, Decreased cognition, Decreased knowledge of use of DME, Decreased mobility, Decreased safety awareness, Difficulty walking, Decreased strength, Impaired UE functional use, Impaired sensation, Impaired vision/preception, Obesity  Visit Diagnosis: Other lack of coordination  Unsteadiness on feet  Muscle weakness (generalized)  Hemiplegia and hemiparesis following cerebral infarction affecting right dominant side (HCC)  Other symptoms and signs involving cognitive functions following cerebral infarction  Visuospatial deficit    Problem List Patient Active Problem List   Diagnosis Date Noted  . Moderate aortic stenosis 01/08/2018  . Ascending aorta dilatation (HCC) 01/08/2018  . Mixed hyperlipidemia 01/08/2018  . History of recent stroke 01/08/2018  . Hypoalbuminemia due to protein-calorie malnutrition ([place])   . Essential hypertension   . Cerebrovascular accident (CVA) due to occlusion of left posterior communicating artery ([place]) 12/10/2017  . Cerebrovascular accident (CVA) due to thrombosis of left posterior cerebral artery ([place])   . Benign  essential HTN   . Morbid obesity ([place])   . OSA (obstructive sleep apnea)   . Enteritis due to Clostridium difficile   . Acute CVA (cerebrovascular accident) ([place]) 12/01/2017  . Acute arterial ischemic stroke, multifocal, posterior circulation ([place]) 12/01/2017    Quay Burow, OTR/L 02/14/2018, 12:31 PM  [facility] [address] [place], [place], [address] Phone: [phone]   Fax:  [phone]  Name: Nthony Lefferts MRN: [id number] Date of Birth: 19-Jul-1966

## 2018-02-14 NOTE — Therapy (Signed)
[facility] [address] [place], [place], [address] Phone: [phone]   Fax:  [phone]  Speech Language Pathology Treatment  Patient Details  Name: William Lowery MRN: [id number] Date of Birth: [birth year] Referring Provider: Dr. Alger Simons   Encounter Date: 02/14/2018  End of Session - 02/14/18 1006    Visit Number  7    Number of Visits  17    Date for SLP Re-Evaluation  03/29/18    SLP Start Time  0933    SLP Stop Time   3557    SLP Time Calculation (min)  42 min    Activity Tolerance  Patient tolerated treatment well       Past Medical History:  Diagnosis Date  . Asthma    hx of  . Heart murmur    as an infant  . Hypertension   . OSA (obstructive sleep apnea)   . Sleep apnea    wears CPAP  . Stroke [facility])     Past Surgical History:  Procedure Laterality Date  . bellybutton procedure     as a newborn  . IR ANGIO INTRA EXTRACRAN SEL COM CAROTID INNOMINATE BILAT MOD SED  12/01/2017  . IR ANGIO VERTEBRAL SEL VERTEBRAL UNI R MOD SED  12/01/2017  . IR CT HEAD LTD  12/01/2017  . IR INTRA CRAN STENT  12/01/2017  . IR PERCUTANEOUS ART THROMBECTOMY/INFUSION INTRACRANIAL INC DIAG ANGIO  12/01/2017  . IR RADIOLOGIST EVAL & MGMT  02/12/2018  . RADIOLOGY WITH ANESTHESIA N/A 12/01/2017   Procedure: RADIOLOGY WITH ANESTHESIA CODE STROKE;  Surgeon: Radiologist, Medication, MD;  Location: [facility];  Service: Radiology;  Laterality: N/A;  . TEE WITHOUT CARDIOVERSION N/A 12/06/2017   Procedure: TRANSESOPHAGEAL ECHOCARDIOGRAM (TEE);  Surgeon: Pixie Casino, MD;  Location: [facility] ENDOSCOPY;  Service: Cardiovascular;  Laterality: N/A;    There were no vitals filed for this visit.  Subjective Assessment - 02/14/18 0936    Currently in Pain?  No/denies            ADULT SLP TREATMENT - 02/14/18 0935      General Information   Behavior/Cognition  Alert;Pleasant mood;Cooperative    Patient Positioning  Upright in chair       Treatment Provided   Treatment provided  Cognitive-Linquistic      Pain Assessment   Pain Assessment  No/denies pain      Cognitive-Linquistic Treatment   Treatment focused on  Cognition;Patient/family/caregiver education    Skilled Treatment  Pt with inappropriate comments at beginning of session, remarking on smell of prior pt treated in the room. SLP facilitated alternating attention between simple finance and problem solving tasks; reinstruction required initially as pt had difficulty understanding the task instructions. Pt demo'd alternating attention for 20 minutes, mildly extended time with 100% , 90% accuracy on tasks, respectively. Cues required to double check work; pt ID'd errors independently with double checking. Pt, wife report he is getting frustrated discussing business with his mom over the phone. Wife showed SLP a paper she has been using to write a to-do list for pt relating to business tasks. SLP educated re: compensations for memory/attention and encouraged pt to take a more active role in creating to-do lists. Pt to acquire a planner and begin keeping a daily to-do list.       Assessment / Recommendations / [place] with current plan of care;Goals updated      Progression Toward Goals   Progression  toward goals  Progressing toward goals       SLP Education - 02/14/18 1225    Education provided  Yes    Education Details  compensations for attention/memory, specifically keeping daily to-do list, compensations for anomia    Person(s) Educated  Patient;Spouse    Methods  Handout    Comprehension  Verbalized understanding       SLP Short Term Goals - 02/14/18 0953      SLP SHORT TERM GOAL #1   Title  pt will use compensations for anomia in 10 minutes mod complex conversation     Time  2    Period  Weeks    Status  On-going      SLP SHORT TERM GOAL #2   Title  pt will name 8 items in mod abstract/complex category with rare min A     Time  2     Period  Weeks    Status  On-going      SLP SHORT TERM GOAL #3   Title  pt will participate in further assessment of language (reading, writing) and cognition and goals added PRN    Status  Achieved      SLP SHORT TERM GOAL #4   Title  pt will demo alternating attention on simple cognitive linguistic tasks for 15 minutes in quiet environment     Time  2    Period  Weeks    Status  Achieved      SLP SHORT TERM GOAL #5   Title  Pt will perform min-mod complex organization/planning/sequencing/finance tasks with 85% accuracy and rare min A     Time  2    Period  Weeks    Status  On-going       SLP Long Term Goals - 02/14/18 1230      SLP LONG TERM GOAL #1   Title  pt will demo functional verbal output in 10 minutes complex conversation over three sessions    Time  6    Period  Weeks    Status  On-going      SLP LONG TERM GOAL #2   Title  in 15 minutes mod complex/complex conversation pt will ask questions or comment appropriately to demo understanding, over 3 sessions    Time  6    Period  Weeks    Status  On-going      SLP LONG TERM GOAL #3   Title  pt will demo alternating attention on mod complex cognitive linguistic tasks for 20 minutes in a mildly distracting environment    Time  6    Period  Weeks    Status  On-going      SLP LONG TERM GOAL #4   Title  pt will complete mod complex-complex organization/planning/sequencing tasks with 90% success over three sessions    Time  6    Period  Weeks    Status  On-going      SLP LONG TERM GOAL #5   Title  pt will ID errors in cognitive linguistic tasks in 90% of opportunities.    Time  6    Period  Weeks    Status  On-going       Plan - 02/14/18 1225    Clinical Impression Statement  Pt continues to demonstrate mild aphasia, impaired attention, memory and executive function. Conversational speech was [facility] today, however pt reports frustration at home not being able to "get my words out." Pt maintained alternating  attention in quiet environment for 20 minutes today. Continue skilled ST to maximize independence and QOL.    Speech Therapy Frequency  2x / week    Treatment/Interventions  Cognitive reorganization;Multimodal communcation approach;Compensatory strategies;Language facilitation;Cueing hierarchy;Internal/external aids;Functional tasks;SLP instruction and feedback;Patient/family education    Potential to Achieve Goals  Good    Consulted and Agree with Plan of Care  Patient;Family member/caregiver       Patient will benefit from skilled therapeutic intervention in order to improve the following deficits and impairments:   Cognitive communication deficit    Problem List Patient Active Problem List   Diagnosis Date Noted  . Moderate aortic stenosis 01/08/2018  . Ascending aorta dilatation (HCC) 01/08/2018  . Mixed hyperlipidemia 01/08/2018  . History of recent stroke 01/08/2018  . Hypoalbuminemia due to protein-calorie malnutrition ([place])   . Essential hypertension   . Cerebrovascular accident (CVA) due to occlusion of left posterior communicating artery ([place]) 12/10/2017  . Cerebrovascular accident (CVA) due to thrombosis of left posterior cerebral artery ([place])   . Benign essential HTN   . Morbid obesity ([place])   . OSA (obstructive sleep apnea)   . Enteritis due to Clostridium difficile   . Acute CVA (cerebrovascular accident) ([place]) 12/01/2017  . Acute arterial ischemic stroke, multifocal, posterior circulation ([place]) 12/01/2017   Deneise Lever, [place], CCC-SLP Speech-Language Pathologist  Aliene Altes 02/14/2018, 12:31 PM  [facility] [address] [place], [place], [address] Phone: [phone]   Fax:  [phone]   Name: William Lowery MRN: [id number] Date of Birth: 1966/03/19

## 2018-02-14 NOTE — Telephone Encounter (Signed)
rx for propranolol sent to [facility] pharmacy

## 2018-02-14 NOTE — Patient Instructions (Signed)
Get a notebook/day planner and start making your own to do lists. Record a to-do list for every day.    Tips for Talking with People who have Aphasia  . Say one thing at a time . Don't  rush - slow down, be patient . Talk face to face . Reduce background noise . Relax - be natural . Use pen and paper . Write down key words . Draw diagrams or pictures . Don't pretend you understand . Ask what helps . Recap - check you both understand . Be a partner, not a therapist   Aphasia does not affect intelligence, only language. The person with aphasia can still: make decisions, have opinions, and socialize.   Describing words  What group does it belong to?  What do I use it for?  Where can I find it?  What does it LOOK like?  What other words go with it?  What is the 1st sound of the word?     Many Ways to Communicate  Describe it Write it Draw it Gesture it Use related words  There's an App for that: Family Feud, Heads up, Stop-fun categories, What if, [employer]

## 2018-02-15 ENCOUNTER — Encounter: Payer: Self-pay | Admitting: Physical Therapy

## 2018-02-15 NOTE — Therapy (Signed)
[facility] [address] [place], [place], [address] Phone: [phone]   Fax:  [phone]  Physical Therapy Treatment  Patient Details  Name: William Lowery MRN: [id number] Date of Birth: [birth year] Referring Provider: Dr. Alger Simons   Encounter Date: 02/14/2018  PT End of Session - 02/15/18 1801    Visit Number  12    Number of Visits  17    Date for PT Re-Evaluation  02/22/18    Authorization Type  Self Pay    PT Start Time  8366    PT Stop Time  1130 pt D/C'd - session ended early    PT Time Calculation (min)  28 min       Past Medical History:  Diagnosis Date  . Asthma    hx of  . Heart murmur    as an infant  . Hypertension   . OSA (obstructive sleep apnea)   . Sleep apnea    wears CPAP  . Stroke [facility])     Past Surgical History:  Procedure Laterality Date  . bellybutton procedure     as a newborn  . IR ANGIO INTRA EXTRACRAN SEL COM CAROTID INNOMINATE BILAT MOD SED  12/01/2017  . IR ANGIO VERTEBRAL SEL VERTEBRAL UNI R MOD SED  12/01/2017  . IR CT HEAD LTD  12/01/2017  . IR INTRA CRAN STENT  12/01/2017  . IR PERCUTANEOUS ART THROMBECTOMY/INFUSION INTRACRANIAL INC DIAG ANGIO  12/01/2017  . IR RADIOLOGIST EVAL & MGMT  02/12/2018  . RADIOLOGY WITH ANESTHESIA N/A 12/01/2017   Procedure: RADIOLOGY WITH ANESTHESIA CODE STROKE;  Surgeon: Radiologist, Medication, MD;  Location: [facility];  Service: Radiology;  Laterality: N/A;  . TEE WITHOUT CARDIOVERSION N/A 12/06/2017   Procedure: TRANSESOPHAGEAL ECHOCARDIOGRAM (TEE);  Surgeon: Pixie Casino, MD;  Location: [facility] ENDOSCOPY;  Service: Cardiovascular;  Laterality: N/A;    There were no vitals filed for this visit.  Subjective Assessment - 02/15/18 1756    Subjective  Pt states he is doing good with mobility and balance - says he is ready for D/C from PT - is going to the beach next week    Patient is accompained by:  Family member    Diagnostic tests   transesophageal echocardiogram 1-31-9    Patient Stated Goals  "I want to be able to move regular"         [facility] PT Assessment - 02/15/18 0001      Berg Balance Test   Sit to Stand  Able to stand without using hands and stabilize independently    Standing Unsupported  Able to stand safely 2 minutes    Sitting with Back Unsupported but Feet Supported on Floor or Stool  Able to sit safely and securely 2 minutes    Stand to Sit  Sits safely with minimal use of hands    Transfers  Able to transfer safely, minor use of hands    Standing Unsupported with Eyes Closed  Able to stand 10 seconds safely    Standing Ubsupported with Feet Together  Able to place feet together independently and stand 1 minute safely    From Standing, Reach Forward with Outstretched Arm  Can reach confidently >25 cm (10")    From Standing Position, Pick up Object from Floor  Able to pick up shoe safely and easily    From Standing Position, Turn to Look Behind Over each Shoulder  Looks behind from both sides and weight shifts well  Turn 360 Degrees  Able to turn 360 degrees safely in 4 seconds or less    Standing Unsupported, Alternately Place Feet on Step/Stool  Able to stand independently and safely and complete 8 steps in 20 seconds    Standing Unsupported, One Foot in Front  Able to place foot tandem independently and hold 30 seconds    Standing on One Leg  Able to lift leg independently and hold > 10 seconds 10.25 on LLE:  8.22 on RLE    Total Score  56                   [facility] Adult PT Treatment/Exercise - 02/15/18 0001      Ambulation/Gait   Ambulation/Gait  Yes    Ambulation/Gait Assistance  5: Supervision    Ambulation/Gait Assistance Details  decreased RUE arm swing during gait    Ambulation Distance (Feet)  200 Feet    Assistive device  None    Gait Pattern  Within Functional Limits    Ambulation Surface  Level;Indoor    Stairs  Yes    Stair Management Technique  Alternating  pattern;Forwards    Number of Stairs  4    Height of Stairs  6               PT Short Term Goals - 02/15/18 1816      PT SHORT TERM GOAL #1   Title  Increase Berg score from 41/56 to >/= 46/56 to reduce fall risk.    Status  Achieved      PT SHORT TERM GOAL #2   Title  Improve TUG score from 14.6 secs to </= 12.0 secs without device for improved functional mobility.    Baseline  14.6 secs with no device; 12.94 secs on 01-28-18    Status  Not Met      PT SHORT TERM GOAL #3   Title  Increase distance in 3" walk test by at least 100' with use of RW to demo improved endurance/activity tolerance.    Baseline  01/31/18: 696 Feet today without AD to demo improved endurance/activity tolerance. (494 feet at baseline)    Status  Achieved      PT SHORT TERM GOAL #4   Title  Negotiate steps (4) with no rail using a step by step sequence with SBA.    Status  Achieved      PT SHORT TERM GOAL #5   Title  Independent in HEP for balance and RLE strengthening.    Baseline  01/31/2018: Pt verbalized independent in HEP for balance and RLE strengthening.     Status  Achieved        PT Long Term Goals - 02/15/18 1817      PT LONG TERM GOAL #1   Title  Increase Berg score from 41/56 to >/= 51/56 to decr. fall risk.    Baseline  41/56 on 12-25-17; 56/56 on 02-14-18    Status  Achieved      PT LONG TERM GOAL #2   Title  Pt will amb. 1000' without device modified independently on all surfaces.     Baseline  met 02-14-18 per pt report    Status  Achieved      PT LONG TERM GOAL #3   Title  Negotiate 4 steps without rail using a step over step sequence.    Baseline  met 02-14-18    Status  Achieved      PT LONG TERM GOAL #  4   Title  Independent in updated HEP, including community fitness options for continued HEP.    Baseline  met 02-14-18    Status  Achieved      PT LONG TERM GOAL #5   Title  Increase gait velocity from 2.92 ft/sec without device to >/= 3.7 ft/sec without device for  incr. gait efficiency.    Baseline  2.92 ft/sec with no device on 12-25-17; 9.10 secs = 3.61 ft/sec - closely approximated goal - 02-14-18    Status  Partially Met              Patient will benefit from skilled therapeutic intervention in order to improve the following deficits and impairments:     Visit Diagnosis: Other abnormalities of gait and mobility  Unsteadiness on feet  Other lack of coordination     Problem List Patient Active Problem List   Diagnosis Date Noted  . Moderate aortic stenosis 01/08/2018  . Ascending aorta dilatation (HCC) 01/08/2018  . Mixed hyperlipidemia 01/08/2018  . History of recent stroke 01/08/2018  . Hypoalbuminemia due to protein-calorie malnutrition ([place])   . Essential hypertension   . Cerebrovascular accident (CVA) due to occlusion of left posterior communicating artery ([place]) 12/10/2017  . Cerebrovascular accident (CVA) due to thrombosis of left posterior cerebral artery ([place])   . Benign essential HTN   . Morbid obesity ([place])   . OSA (obstructive sleep apnea)   . Enteritis due to Clostridium difficile   . Acute CVA (cerebrovascular accident) ([place]) 12/01/2017  . Acute arterial ischemic stroke, multifocal, posterior circulation ([place]) 12/01/2017    PHYSICAL THERAPY DISCHARGE SUMMARY  Visits from Start of Care: 12  Current functional level related to goals / functional outcomes: See above for progress towards goals - all goals met or closely approximated   Remaining deficits: Mildly decreased high level balance skills; mildly decreased coordination RLE but not significantly impacting function   Education / Equipment: Pt has been instructed in balance HEP  Plan: Patient agrees to discharge.  Patient goals were met. Patient is being discharged due to meeting the stated rehab goals.  ?????   Pt states he is pleased with his current functional status and feels that he is ready for discharge from PT.         Alda Lea, PT 02/15/2018, 6:22 PM  [facility] [address] [place], [place], [address] Phone: [phone]   Fax:  [phone]  Name: Arie Powell MRN: [id number] Date of Birth: 13-Nov-1965

## 2018-02-18 ENCOUNTER — Other Ambulatory Visit (HOSPITAL_COMMUNITY): Payer: Self-pay | Admitting: Interventional Radiology

## 2018-02-18 DIAGNOSIS — I639 Cerebral infarction, unspecified: Secondary | ICD-10-CM

## 2018-02-19 ENCOUNTER — Encounter: Payer: Self-pay | Admitting: Occupational Therapy

## 2018-02-19 ENCOUNTER — Ambulatory Visit: Payer: Self-pay | Admitting: Physical Therapy

## 2018-02-19 ENCOUNTER — Ambulatory Visit: Payer: PRIVATE HEALTH INSURANCE | Admitting: Occupational Therapy

## 2018-02-19 ENCOUNTER — Encounter: Payer: Self-pay | Admitting: Speech Pathology

## 2018-02-19 ENCOUNTER — Ambulatory Visit: Payer: PRIVATE HEALTH INSURANCE | Admitting: Speech Pathology

## 2018-02-19 DIAGNOSIS — R41841 Cognitive communication deficit: Secondary | ICD-10-CM

## 2018-02-19 DIAGNOSIS — M6281 Muscle weakness (generalized): Secondary | ICD-10-CM | POA: Diagnosis not present

## 2018-02-19 DIAGNOSIS — I69351 Hemiplegia and hemiparesis following cerebral infarction affecting right dominant side: Secondary | ICD-10-CM

## 2018-02-19 DIAGNOSIS — R2681 Unsteadiness on feet: Secondary | ICD-10-CM

## 2018-02-19 DIAGNOSIS — R278 Other lack of coordination: Secondary | ICD-10-CM

## 2018-02-19 NOTE — Patient Instructions (Signed)
   Keep a list in the same place  Get in a habit of checking the list throughout the day  We are trying to have [name] make connections - Let's look at the garbage, or Let's look around the kitchen - what needs to be done? OK. Add that to your list  Include cognitive activities and exercises on your list - you have do to the cognitive activities daily- even though you don't like them  [name] is doing a good job trying to modify your environment at work and home to reduce noise, distractions

## 2018-02-19 NOTE — Therapy (Signed)
[facility] [address] [place], [place], [address] Phone: [phone]   Fax:  [phone]  Occupational Therapy Treatment  Patient Details  Name: William Lowery MRN: [id number] Date of Birth: Mar 12, 1966 Referring Provider: Dr. Alger Simons    Encounter Date: 02/19/2018  OT End of Session - 02/19/18 1522    Visit Number  11    Number of Visits  16    Date for OT Re-Evaluation  02/26/18    Authorization Type  self pay    OT Start Time  1146    OT Stop Time  1231    OT Time Calculation (min)  45 min    Activity Tolerance  Patient tolerated treatment well       Past Medical History:  Diagnosis Date  . Asthma    hx of  . Heart murmur    as an infant  . Hypertension   . OSA (obstructive sleep apnea)   . Sleep apnea    wears CPAP  . Stroke [facility])     Past Surgical History:  Procedure Laterality Date  . bellybutton procedure     as a newborn  . IR ANGIO INTRA EXTRACRAN SEL COM CAROTID INNOMINATE BILAT MOD SED  12/01/2017  . IR ANGIO VERTEBRAL SEL VERTEBRAL UNI R MOD SED  12/01/2017  . IR CT HEAD LTD  12/01/2017  . IR INTRA CRAN STENT  12/01/2017  . IR PERCUTANEOUS ART THROMBECTOMY/INFUSION INTRACRANIAL INC DIAG ANGIO  12/01/2017  . IR RADIOLOGIST EVAL & MGMT  02/12/2018  . RADIOLOGY WITH ANESTHESIA N/A 12/01/2017   Procedure: RADIOLOGY WITH ANESTHESIA CODE STROKE;  Surgeon: Radiologist, Medication, MD;  Location: [facility];  Service: Radiology;  Laterality: N/A;  . TEE WITHOUT CARDIOVERSION N/A 12/06/2017   Procedure: TRANSESOPHAGEAL ECHOCARDIOGRAM (TEE);  Surgeon: Pixie Casino, MD;  Location: [facility] ENDOSCOPY;  Service: Cardiovascular;  Laterality: N/A;    There were no vitals filed for this visit.  Subjective Assessment - 02/19/18 1154    Subjective   Wife states pt gets upset if she cues him to use his R hand more    Patient is accompained by:  Family member wife William Lowery    Pertinent History  12/01/2017 L PCA CVA      Patient Stated Goals  I hope to get as much of my right hand working again as I can    Currently in Pain?  No/denies                   OT Treatments/Exercises (OP) - 02/19/18 0001      ADLs   Writing  Assessed pre writing skills again today as pt has started meds for tremor.  Tremor has improved however tremor remains signifcant enough for writing to not be functional at this time. Pt does show improvement in tracing and was able to reduce down from brown foam to coban wrap on pen.  Issued worksheets for pt to work on at home.     ADL Comments  Long discussion with pt and wife regarding how much wife is cueing pt to use his R hand functionally at home. Discussed that at this point pt has the awareness and enough skill in R hand that he can use hand functionally - however pt has to decide that using his R hand is a goal for him vs using L hand because it is easier and quicker.  Stressed to pt and wife that this is the pt's choice and  that there are consequences with either decision.  Wife and pt in agreement by end of session.  Pt to be placed on hold at this time to work on remaining goals for a period of time and to address some outstanding medical issues. Wife to call and reschedule when appropriate.Pt and wife in agreement with plan.               OT Short Term Goals - 02/19/18 1521      OT SHORT TERM GOAL #1   Title  Pt and wife will be mod I with HEP for coordination, RUE strength and cognition - due 01/22/2018    Status  Achieved      OT SHORT TERM GOAL #2   Title  Pt will demonstrate improved gross control of RUE as evidenced by increasing by at least 5 blocks on Box and Blocks to assist with basic ADL's    Status  Achieved 01/22/2018  23 blocks      OT SHORT TERM GOAL #3   Title  Pt will be mod I with bathing at shower level using AE for back prn    Status  Partially Met wife still wishes to provide distant supevision      OT SHORT TERM GOAL #4   Title  Pt  will be mod I with dressing    Status  Achieved      OT SHORT TERM GOAL #5   Title  Pt will be mod I with toilet hygiene    Status  Achieved      OT SHORT TERM GOAL #6   Title  Pt will demonstrate inmproved grip strength by at least 5 pounds to assist with basic ADL and IADL tasks (baseline =20 pounds)    Status  Achieved 01/22/2018  32 pounds      OT SHORT TERM GOAL #7   Title  Pt will use RUE as stabilizer during ADL and IADL tasks    Status  Achieved        OT Long Term Goals - 02/19/18 1521      OT LONG TERM GOAL #1   Title  Pt will be mod I with upgraded HEP for cognition and RUE funcitonal use - 02/26/2018 (date adjusted as pt missed one week of therapy)    Status  Achieved      OT LONG TERM GOAL #2   Title  Pt will demonstrate improved gross motor control of RUE as evidenced by improving by at least 20 blocks on BOx and blocks to assist with IADL's (baseline = 12)    Status  On-going 02/11/2018  24 blocks      OT LONG TERM GOAL #3   Title  Pt will demonstrate improved grip strength by at least  20 pounds to assist with IADL tasks ( baseline= 20 pounds)    Status  Achieved 02/11/2018  56 pounds      OT LONG TERM GOAL #4   Title  Pt will demonstrate ability for basic problem solving and organization with familiar functional task.    Status  Achieved      OT LONG TERM GOAL #5   Title  Pt will demonstrate ability to write name with 100% legibility    Status  On-going      OT LONG TERM GOAL #6   Title  Pt will demonstrate ability for environmental scanning during physical task with 90% accuracy.    Status  Achieved  OT LONG TERM GOAL #7   Title  Pt will demonstrate ability to complete functional task on outdoor uneven surfaces involving basic problem solving and organization.     Status  Achieved            Plan - 02/19/18 1522    Clinical Impression Statement  Pt has made good progress toward goals. Pt to be placed on hold at this time work on remaining goals  and to address some outstanding medical issues.     Occupational Profile and client history currently impacting functional performance  PMH: morbid obesity, HTN, sleep apnea, small remote R cerebellar CVA, asthma. Pt with loop recorder!!!    Occupational performance deficits (Please refer to evaluation for details):  ADL's;IADL's;Work;Leisure;Social Participation    Rehab Potential  Good    Current Impairments/barriers affecting progress:  apraxia, ataxia, intention tremor    OT Frequency  2x / week    OT Duration  8 weeks    OT Treatment/Interventions  Self-care/ADL training;DME and/or AE instruction;Neuromuscular education;Therapeutic exercise;Therapist, nutritional;Therapeutic activities;Cognitive remediation/compensation;Visual/perceptual remediation/compensation;Patient/family education;Balance training    Plan  continue to encourage modified constraint induced approach is working at home and incorporate into sessions, NMR for RUE funtional use including weight bearing and closed chain activities, balance, incorporate cognition , functional use of R hand, writing, assess eating?    Consulted and Agree with Plan of Care  Patient;Family member/caregiver    Family Member Consulted  wife William Lowery       Patient will benefit from skilled therapeutic intervention in order to improve the following deficits and impairments:  Abnormal gait, Decreased activity tolerance, Decreased balance, Decreased coordination, Decreased cognition, Decreased knowledge of use of DME, Decreased mobility, Decreased safety awareness, Difficulty walking, Decreased strength, Impaired UE functional use, Impaired sensation, Impaired vision/preception, Obesity  Visit Diagnosis: Unsteadiness on feet  Other lack of coordination  Muscle weakness (generalized)  Hemiplegia and hemiparesis following cerebral infarction affecting right dominant side [facility])    Problem List Patient Active Problem List   Diagnosis Date Noted   . Moderate aortic stenosis 01/08/2018  . Ascending aorta dilatation (HCC) 01/08/2018  . Mixed hyperlipidemia 01/08/2018  . History of recent stroke 01/08/2018  . Hypoalbuminemia due to protein-calorie malnutrition ([place])   . Essential hypertension   . Cerebrovascular accident (CVA) due to occlusion of left posterior communicating artery ([place]) 12/10/2017  . Cerebrovascular accident (CVA) due to thrombosis of left posterior cerebral artery ([place])   . Benign essential HTN   . Morbid obesity ([place])   . OSA (obstructive sleep apnea)   . Enteritis due to Clostridium difficile   . Acute CVA (cerebrovascular accident) ([place]) 12/01/2017  . Acute arterial ischemic stroke, multifocal, posterior circulation ([place]) 12/01/2017    Quay Burow, OTR/L 02/19/2018, 3:23 PM  [facility] [address] [place], [place], [address] Phone: [phone]   Fax:  [phone]  Name: William Lowery MRN: [id number] Date of Birth: 10/09/1966

## 2018-02-19 NOTE — Therapy (Signed)
[employer] [address] [place], [place], [address] Phone: [phone]   Fax:  [phone]  Speech Language Pathology Treatment  Patient Details  Name: Danell Verno MRN: [id number] Date of Birth: 06-13-1966 Referring Provider: Dr. Alger Simons   Encounter Date: 02/19/2018  End of Session - 02/19/18 1210    Visit Number  8    Number of Visits  17    Date for SLP Re-Evaluation  03/29/18    SLP Start Time  1103    SLP Stop Time   1148    SLP Time Calculation (min)  45 min    Activity Tolerance  Patient tolerated treatment well       Past Medical History:  Diagnosis Date  . Asthma    hx of  . Heart murmur    as an infant  . Hypertension   . OSA (obstructive sleep apnea)   . Sleep apnea    wears CPAP  . Stroke [facility])     Past Surgical History:  Procedure Laterality Date  . bellybutton procedure     as a newborn  . IR ANGIO INTRA EXTRACRAN SEL COM CAROTID INNOMINATE BILAT MOD SED  12/01/2017  . IR ANGIO VERTEBRAL SEL VERTEBRAL UNI R MOD SED  12/01/2017  . IR CT HEAD LTD  12/01/2017  . IR INTRA CRAN STENT  12/01/2017  . IR PERCUTANEOUS ART THROMBECTOMY/INFUSION INTRACRANIAL INC DIAG ANGIO  12/01/2017  . IR RADIOLOGIST EVAL & MGMT  02/12/2018  . RADIOLOGY WITH ANESTHESIA N/A 12/01/2017   Procedure: RADIOLOGY WITH ANESTHESIA CODE STROKE;  Surgeon: Radiologist, Medication, MD;  Location: [place];  Service: Radiology;  Laterality: N/A;  . TEE WITHOUT CARDIOVERSION N/A 12/06/2017   Procedure: TRANSESOPHAGEAL ECHOCARDIOGRAM (TEE);  Surgeon: Pixie Casino, MD;  Location: [facility] ENDOSCOPY;  Service: Cardiovascular;  Laterality: N/A;    There were no vitals filed for this visit.  Subjective Assessment - 02/19/18 1109    Subjective  "I don't remember what she said"    Patient is accompained by:  Family member    Currently in Pain?  No/denies            ADULT SLP TREATMENT - 02/19/18 1111      General Information    Behavior/Cognition  Alert;Pleasant mood;Cooperative      Treatment Provided   Treatment provided  Cognitive-Linquistic      Cognitive-Linquistic Treatment   Treatment focused on  Cognition;Patient/family/caregiver education    Skilled Treatment  Spouse entering with questions re: getting [name] to use a list and how to get him to follow through with tasks at home. Educated spouse to begin my making the list with him, ask him questioning cues and guided observations around the house to assist pt in making connections and problem solving about daily chores. Also trained spouse that she may need to cue him during the day to re check his list until it becomes a habit. Also instructed spouse and pt to put OT and cognitive activities on the daily list. Moderately complex math reasoning required written cues by ptt, however, pt was timely and accurate in organizing, analyzing and solving problems with min a.       Assessment / Recommendations / Plan   Plan  Continue with current plan of care;Goals updated      Progression Toward Goals   Progression toward goals  Progressing toward goals       SLP Education - 02/19/18 1208    Education provided  Yes    Education Details  onging training for carryover of daily to do list    Person(s) Educated  Patient;Spouse    Methods  Handout    Comprehension  Verbalized understanding       SLP Short Term Goals - 02/19/18 1209      SLP SHORT TERM GOAL #1   Title  pt will use compensations for anomia in 10 minutes mod complex conversation     Time  1    Period  Weeks    Status  On-going      SLP SHORT TERM GOAL #2   Title  pt will name 8 items in mod abstract/complex category with rare min A     Time  1    Period  Weeks    Status  On-going      SLP SHORT TERM GOAL #3   Title  pt will participate in further assessment of language (reading, writing) and cognition and goals added PRN    Status  Achieved      SLP SHORT TERM GOAL #4   Title  pt will  demo alternating attention on simple cognitive linguistic tasks for 15 minutes in quiet environment     Time  2    Period  Weeks    Status  Achieved      SLP SHORT TERM GOAL #5   Title  Pt will perform min-mod complex organization/planning/sequencing/finance tasks with 85% accuracy and rare min A     Time  1    Period  Weeks    Status  On-going       SLP Long Term Goals - 02/19/18 1209      SLP LONG TERM GOAL #1   Title  pt will demo functional verbal output in 10 minutes complex conversation over three sessions    Time  5    Period  Weeks    Status  On-going      SLP LONG TERM GOAL #2   Title  in 15 minutes mod complex/complex conversation pt will ask questions or comment appropriately to demo understanding, over 3 sessions    Time  5    Period  Weeks    Status  On-going      SLP LONG TERM GOAL #3   Title  pt will demo alternating attention on mod complex cognitive linguistic tasks for 20 minutes in a mildly distracting environment    Time  5    Period  Weeks    Status  On-going      SLP LONG TERM GOAL #4   Title  pt will complete mod complex-complex organization/planning/sequencing tasks with 90% success over three sessions    Time  5    Period  Weeks    Status  On-going      SLP LONG TERM GOAL #5   Title  pt will ID errors in cognitive linguistic tasks in 90% of opportunities.    Time  5    Period  Weeks    Status  On-going       Plan - 02/19/18 1208    Clinical Impression Statement  Pt continues to demonstrate mild aphasia, impaired attention, memory and executive function. Conversational speech was [facility] today, however pt reports frustration at home not being able to "get my words out." Pt maintained alternating attention in quiet environment for 20 minutes today. Continue skilled ST to maximize independence and QOL.    Speech Therapy Frequency  2x / week  Treatment/Interventions  Cognitive reorganization;Multimodal communcation approach;Compensatory  strategies;Language facilitation;Cueing hierarchy;Internal/external aids;Functional tasks;SLP instruction and feedback;Patient/family education    Potential to Achieve Goals  Good    Consulted and Agree with Plan of Care  Patient;Family member/caregiver       Patient will benefit from skilled therapeutic intervention in order to improve the following deficits and impairments:   Cognitive communication deficit    Problem List Patient Active Problem List   Diagnosis Date Noted  . Moderate aortic stenosis 01/08/2018  . Ascending aorta dilatation (HCC) 01/08/2018  . Mixed hyperlipidemia 01/08/2018  . History of recent stroke 01/08/2018  . Hypoalbuminemia due to protein-calorie malnutrition ([place])   . Essential hypertension   . 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 Cerebrovascular accident (CVA) due to thrombosis of left posterior cerebral artery ([place])   . Benign essential HTN   . Morbid obesity ([place])   . OSA (obstructive sleep apnea)   . Enteritis due to Clostridium difficile   . Acute CVA (cerebrovascular accident) ([place]) 12/01/2017  . Acute arterial ischemic stroke, multifocal, posterior circulation ([place]) 12/01/2017    Gianina Olinde, Annye Rusk MS, [place] SLP 02/19/2018, 12:11 PM  [facility] [address] [place], [place], [address] Phone: [phone]   Fax:  [phone]   Name: Trayton Szabo MRN: [id number] Date of Birth: 11-Sep-1966

## 2018-02-21 ENCOUNTER — Encounter: Payer: Self-pay | Admitting: Occupational Therapy

## 2018-02-21 ENCOUNTER — Encounter: Payer: Self-pay | Admitting: Speech Pathology

## 2018-02-21 ENCOUNTER — Ambulatory Visit: Payer: Self-pay

## 2018-02-25 ENCOUNTER — Encounter: Payer: Self-pay | Admitting: Occupational Therapy

## 2018-02-25 ENCOUNTER — Encounter: Payer: Self-pay | Admitting: Speech Pathology

## 2018-02-27 ENCOUNTER — Encounter: Payer: Self-pay | Admitting: Occupational Therapy

## 2018-02-28 ENCOUNTER — Encounter (HOSPITAL_COMMUNITY): Payer: Self-pay

## 2018-02-28 ENCOUNTER — Ambulatory Visit (HOSPITAL_COMMUNITY)
Admission: RE | Admit: 2018-02-28 | Discharge: 2018-02-28 | Disposition: A | Discharge status: Home / Self Care | Payer: PRIVATE HEALTH INSURANCE | Source: Ambulatory Visit | Attending: Interventional Radiology | Admitting: Interventional Radiology

## 2018-02-28 DIAGNOSIS — I639 Cerebral infarction, unspecified: Secondary | ICD-10-CM | POA: Insufficient documentation

## 2018-02-28 LAB — CREATININE, SERUM
CREATININE: 0.77 mg/dL (ref 0.61–1.24)
GFR calc Af Amer: >60 mL/min (ref >60)
GFR calc non Af Amer: >60 mL/min (ref >60)

## 2018-03-01 ENCOUNTER — Other Ambulatory Visit: Payer: Self-pay | Admitting: Student

## 2018-03-01 ENCOUNTER — Telehealth: Payer: Self-pay | Admitting: Student

## 2018-03-01 NOTE — Telephone Encounter (Addendum)
Received message from patient regarding MRI/MRA and thyroid biopsy questions. 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 Estanislado Pandy. Patient is currently taking Brilinta 90 mg twice daily and Aspirin 81 mg once daily.  Spoke with patient's wife.  States patient was scheduled for MRI/MRA yesterday, but could not complete because he had a panic attack. She asks if Dr. Estanislado Pandy could give her husband something for anxiety. Informed wife that Dr. Estanislado Pandy will prescribe Xanax 0.25 mg PO for MRI/MRA, will put order in computer today. Informed wife that the MRI/MRA scan will need to be rescheduled with our schedulers. Wife states she will call office Monday morning.  Also states patient is scheduled for a thyroid biopsy and is curious if her husband can come off Brilinta for this. Informed patient, per Dr. Estanislado Pandy, that the physician ordering the thyroid biopsy needs to send a formal written request to Dr. Estanislado Pandy for the patient to come off Brilinta, including procedure name and indication.  Informed wife that patient is to continue taking Brilinta 90 mg twice daily and Aspirin 81 mg once daily.  All questions answered and concerns addressed. Patient's wife conveys understanding and agrees with plan.  Bea Graff Louk, PA-C 03/01/2018, 4:18 PM

## 2018-03-04 ENCOUNTER — Ambulatory Visit: Payer: PRIVATE HEALTH INSURANCE | Admitting: Speech Pathology

## 2018-03-04 ENCOUNTER — Other Ambulatory Visit: Payer: Self-pay

## 2018-03-04 ENCOUNTER — Other Ambulatory Visit: Payer: Self-pay | Admitting: Student

## 2018-03-04 ENCOUNTER — Telehealth: Payer: Self-pay | Admitting: *Deleted

## 2018-03-04 DIAGNOSIS — M6281 Muscle weakness (generalized): Secondary | ICD-10-CM | POA: Diagnosis not present

## 2018-03-04 DIAGNOSIS — R41841 Cognitive communication deficit: Secondary | ICD-10-CM

## 2018-03-04 MED ORDER — PROPRANOLOL HCL 20 MG PO TABS: 20.0000 mg | ORAL_TABLET | Freq: Three times a day (TID) | ORAL | 0 refills | Status: DC

## 2018-03-04 NOTE — Telephone Encounter (Signed)
Patient's wife called  And left a message stating that the intentional tremor medication is not working.  I called back to clarify.  The 10 milligram propranolol TID is not working.  I consulted Dr. Naaman Plummer face to face.  He stated  20mg  propranolol TID.  Medication ordered, Patient's wife notified

## 2018-03-04 NOTE — Patient Outreach (Signed)
Telephone outreach to patient to obtain mRS was successfully completed. mRS = 3 

## 2018-03-04 NOTE — Therapy (Signed)
[facility] [address] [place], [place], [address] Phone: [phone]   Fax:  [phone]  Speech Language Pathology Treatment  Patient Details  Name: William Lowery MRN: [id number] Date of Birth: May 25, 1966 Referring Provider: Dr. Alger Simons   Encounter Date: 03/04/2018  End of Session - 03/04/18 1727    Visit Number  9    Number of Visits  17    Date for SLP Re-Evaluation  03/29/18    SLP Start Time  1535 pt 5 min late    SLP Stop Time   1620    SLP Time Calculation (min)  45 min    Activity Tolerance  Patient tolerated treatment well       Past Medical History:  Diagnosis Date  . Asthma    hx of  . Heart murmur    as an infant  . Hypertension   . OSA (obstructive sleep apnea)   . Sleep apnea    wears CPAP  . Stroke [facility])     Past Surgical History:  Procedure Laterality Date  . bellybutton procedure     as a newborn  . IR ANGIO INTRA EXTRACRAN SEL COM CAROTID INNOMINATE BILAT MOD SED  12/01/2017  . IR ANGIO VERTEBRAL SEL VERTEBRAL UNI R MOD SED  12/01/2017  . IR CT HEAD LTD  12/01/2017  . IR INTRA CRAN STENT  12/01/2017  . IR PERCUTANEOUS ART THROMBECTOMY/INFUSION INTRACRANIAL INC DIAG ANGIO  12/01/2017  . IR RADIOLOGIST EVAL & MGMT  02/12/2018  . RADIOLOGY WITH ANESTHESIA N/A 12/01/2017   Procedure: RADIOLOGY WITH ANESTHESIA CODE STROKE;  Surgeon: Radiologist, Medication, MD;  Location: [place];  Service: Radiology;  Laterality: N/A;  . TEE WITHOUT CARDIOVERSION N/A 12/06/2017   Procedure: TRANSESOPHAGEAL ECHOCARDIOGRAM (TEE);  Surgeon: Pixie Casino, MD;  Location: [facility] ENDOSCOPY;  Service: Cardiovascular;  Laterality: N/A;    There were no vitals filed for this visit.  Subjective Assessment - 03/04/18 1731    Subjective  Pt with flat greeting to SLP.            ADULT SLP TREATMENT - 03/04/18 1335      General Information   Behavior/Cognition  Alert;Pleasant mood;Cooperative    Patient  Positioning  Upright in chair      Treatment Provided   Treatment provided  Cognitive-Linquistic      Pain Assessment   Pain Assessment  No/denies pain      Cognitive-Linquistic Treatment   Treatment focused on  Cognition;Patient/family/caregiver education    Skilled Treatment  SLP targeted alternating attention in mildly distracting environment with finance billing task. Pt maintained alternating attention, 20 min x2, refocusing independently after conversational distractions. Extended time required; pt spontaneously checked his work for errors and 100% accuracy on mod complex task. 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       Assessment / Recommendations / Plan   Plan  Continue with current plan of care;Goals updated      Progression Toward Goals   Progression toward goals  Progressing toward goals       SLP Education - 03/04/18 1730    Education provided  Yes    Education Details  do cognitive activities every day    Person(s) Educated  Patient;Spouse    Methods  Explanation    Comprehension  Verbalized understanding       SLP Short Term Goals -  03/04/18 1728      SLP SHORT TERM GOAL #1   Title  pt will use compensations for anomia in 10 minutes mod complex conversation     Time  1    Period  Weeks    Status  Deferred      SLP SHORT TERM GOAL #2   Title  pt will name 8 items in mod abstract/complex category with rare min A     Time  1    Period  Weeks    Status  Deferred      SLP SHORT TERM GOAL #3   Title  pt will participate in further assessment of language (reading, writing) and cognition and goals added PRN    Status  Achieved      SLP SHORT TERM GOAL #4   Title  pt will demo alternating attention on simple cognitive linguistic tasks for 15 minutes in quiet environment     Period  Weeks    Status  Achieved      SLP SHORT TERM GOAL #5   Title  Pt will perform min-mod  complex organization/planning/sequencing/finance tasks with 85% accuracy and rare min A     Time  1    Period  Weeks    Status  Achieved       SLP Long Term Goals - 03/04/18 1729      SLP LONG TERM GOAL #1   Title  pt will demo functional verbal output in 10 minutes complex conversation over three sessions    Time  4    Period  Weeks    Status  On-going      SLP LONG TERM GOAL #2   Title  in 15 minutes mod complex/complex conversation pt will ask questions or comment appropriately to demo understanding, over 3 sessions    Time  4    Period  Weeks    Status  On-going      SLP LONG TERM GOAL #3   Title  pt will demo alternating attention on mod complex cognitive linguistic tasks for 20 minutes in a mildly distracting environment    Baseline  03/04/18    Time  4    Period  Weeks    Status  On-going      SLP LONG TERM GOAL #4   Title  pt will complete mod complex-complex organization/planning/sequencing tasks with 90% success over three sessions    Baseline  03/04/18    Time  4    Period  Weeks    Status  On-going      SLP LONG TERM GOAL #5   Title  pt will ID errors in cognitive linguistic tasks in 90% of opportunities.    Baseline  03/04/18    Time  4    Period  Weeks    Status  On-going       Plan - 03/04/18 1728    Clinical Impression Statement  Pt continues to demonstrate mild aphasia, impaired attention, memory and executive function. Conversational speech was [facility] today, however pt reports frustration at home not being able to "get my words out." Pt maintained alternating attention in mildly distracting environment for 20 minutes x2 today. Continue skilled ST to maximize independence and QOL.    Speech Therapy Frequency  2x / week    Treatment/Interventions  Cognitive reorganization;Multimodal communcation approach;Compensatory strategies;Language facilitation;Cueing hierarchy;Internal/external aids;Functional tasks;SLP instruction and feedback;Patient/family education     Potential to Achieve Goals  Good    Consulted and  Agree with Plan of Care  Patient;Family member/caregiver       Patient will benefit from skilled therapeutic intervention in order to improve the following deficits and impairments:   Cognitive communication deficit    Problem List Patient Active Problem List   Diagnosis Date Noted  . Moderate aortic stenosis 01/08/2018  . Ascending aorta dilatation (HCC) 01/08/2018  . Mixed hyperlipidemia 01/08/2018  . History of recent stroke 01/08/2018  . Hypoalbuminemia due to protein-calorie malnutrition ([place])   . Essential hypertension   . 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 Cerebrovascular accident (CVA) due to thrombosis of left posterior cerebral artery ([place])   . Benign essential HTN   . Morbid obesity ([place])   . OSA (obstructive sleep apnea)   . Enteritis due to Clostridium difficile   . Acute CVA (cerebrovascular accident) ([place]) 12/01/2017  . Acute arterial ischemic stroke, multifocal, posterior circulation ([place]) 12/01/2017   Deneise Lever, [place], [place] 03/04/2018, 5:31 PM  [facility] [address] [place] [address], [place], [address] Phone: [phone]   Fax:  [phone]   Name: William Lowery MRN: [id number] Date of Birth: 1966/06/17

## 2018-03-06 ENCOUNTER — Ambulatory Visit: Payer: PRIVATE HEALTH INSURANCE | Admitting: Occupational Therapy

## 2018-03-06 ENCOUNTER — Ambulatory Visit: Payer: Self-pay | Admitting: Physical Therapy

## 2018-03-07 ENCOUNTER — Ambulatory Visit (HOSPITAL_COMMUNITY): Payer: No Typology Code available for payment source

## 2018-03-07 ENCOUNTER — Encounter: Payer: No Typology Code available for payment source | Admitting: Speech Pathology

## 2018-03-07 ENCOUNTER — Encounter: Payer: Self-pay | Admitting: Occupational Therapy

## 2018-03-08 ENCOUNTER — Ambulatory Visit: Payer: Self-pay | Admitting: Physical Therapy

## 2018-03-14 ENCOUNTER — Encounter: Payer: No Typology Code available for payment source | Admitting: Speech Pathology

## 2018-03-14 ENCOUNTER — Other Ambulatory Visit: Payer: Self-pay | Admitting: Physical Medicine & Rehabilitation

## 2018-03-18 ENCOUNTER — Ambulatory Visit: Payer: PRIVATE HEALTH INSURANCE | Attending: Physical Medicine & Rehabilitation | Admitting: Speech Pathology

## 2018-03-18 DIAGNOSIS — R4701 Aphasia: Secondary | ICD-10-CM | POA: Diagnosis present

## 2018-03-18 DIAGNOSIS — R41841 Cognitive communication deficit: Secondary | ICD-10-CM | POA: Insufficient documentation

## 2018-03-18 NOTE — Patient Instructions (Signed)
Come up with a system to record the details of what you do at work (you can use the notes app on your phone or handwrite a list, keep a notebook or calendar)  Look at using "speech to text" or dictation software to speed things up if you want  Make a to-do list for every day at work and record details of other things that happen at work.   Think of a few "low-stakes" tasks that you could attempt. Write down what they are and the steps involved.

## 2018-03-18 NOTE — Therapy (Signed)
[facility] [address] [place], [place], [address] Phone: [phone]   Fax:  [phone]  Speech Language Pathology Treatment  Patient Details  Name: Fredrich Cory MRN: [id number] Date of Birth: December 07, 1965 Referring Provider: Dr. Alger Simons   Encounter Date: 03/18/2018  End of Session - 03/18/18 1753    Visit Number  10    Number of Visits  17    Date for SLP Re-Evaluation  03/29/18    SLP Start Time  1618    SLP Stop Time   1700    SLP Time Calculation (min)  42 min    Activity Tolerance  Patient tolerated treatment well       Past Medical History:  Diagnosis Date  . Asthma    hx of  . Heart murmur    as an infant  . Hypertension   . OSA (obstructive sleep apnea)   . Sleep apnea    wears CPAP  . Stroke [facility])     Past Surgical History:  Procedure Laterality Date  . bellybutton procedure     as a newborn  . IR ANGIO INTRA EXTRACRAN SEL COM CAROTID INNOMINATE BILAT MOD SED  12/01/2017  . IR ANGIO VERTEBRAL SEL VERTEBRAL UNI R MOD SED  12/01/2017  . IR CT HEAD LTD  12/01/2017  . IR INTRA CRAN STENT  12/01/2017  . IR PERCUTANEOUS ART THROMBECTOMY/INFUSION INTRACRANIAL INC DIAG ANGIO  12/01/2017  . IR RADIOLOGIST EVAL & MGMT  02/12/2018  . RADIOLOGY WITH ANESTHESIA N/A 12/01/2017   Procedure: RADIOLOGY WITH ANESTHESIA CODE STROKE;  Surgeon: Radiologist, Medication, MD;  Location: [place];  Service: Radiology;  Laterality: N/A;  . TEE WITHOUT CARDIOVERSION N/A 12/06/2017   Procedure: TRANSESOPHAGEAL ECHOCARDIOGRAM (TEE);  Surgeon: Pixie Casino, MD;  Location: [facility] ENDOSCOPY;  Service: Cardiovascular;  Laterality: N/A;    There were no vitals filed for this visit.  Subjective Assessment - 03/18/18 1751    Subjective  Pt reports he started keeping notebook but did not keep up with it. Did not bring with him today.    Patient is accompained by:  Family member    Currently in Pain?  No/denies             ADULT SLP TREATMENT - 03/18/18 1418      General Information   Behavior/Cognition  Alert;Pleasant mood;Cooperative    Patient Positioning  Upright in chair      Treatment Provided   Treatment provided  Cognitive-Linquistic      Pain Assessment   Pain Assessment  No/denies pain      Cognitive-Linquistic Treatment   Treatment focused on  Cognition;Patient/family/caregiver education    Skilled Treatment  In 15 minute conversation re: his work activities, pt's verbal output was functional and he demo'd appropriate understanding, commenting and asking questions as SLP provided education re: strategies to improve organization and functional recall at work. Pt, wife report pt gets frustrated when he is unable to think of a word (reports he was unable to think of 2 singers names over the weekend). SLP worked with pt, cuing compensations for anomia (use of description, related words) to demonstrate how pt could have worked around this breakdown. Wife with many questions re: pt's progress and prognosis for recovery. SLP provided education re: way to continue to facilitate pt's cognitive recovery by working on cognitive tasks outside of [place] room. Encouraged pt to keep daily to-do list and a log of his activities and conversations when he  does go in to work (has been going 2-3 hours at a time with wife). Wife has been recording details of pt's work related conversations; SLP advised that pt should do this himself to improve independence, adjust to using compensatory strategies to improve his recall. Pt now has access to his phone; SLP encouraged pt and wife to come up with a system for recall (written or electronic) that pt uses every day. Facilitated organization in functional task; pt ID'd a simple task he could complete at work and generated a checklist of steps to complete the task.       Assessment / Recommendations / Plan   Plan  Continue with current plan of care;Goals updated       Progression Toward Goals   Progression toward goals  Progressing toward goals       SLP Education - 03/18/18 1808    Education provided  Yes    Education Details  use a system (electronic or written) to keep a daily to-do list and log    Person(s) Educated  Patient;Spouse    Methods  Explanation    Comprehension  Verbalized understanding       SLP Short Term Goals - 03/18/18 1754      SLP SHORT TERM GOAL #1   Title  pt will use compensations for anomia in 10 minutes mod complex conversation     Status  Deferred      SLP [place] #2   Title  pt will name 8 items in mod abstract/complex category with rare min A     Status  Deferred      SLP SHORT TERM GOAL #3   Title  pt will participate in further assessment of language (reading, writing) and cognition and goals added PRN    Status  Achieved      SLP SHORT TERM GOAL #4   Title  pt will demo alternating attention on simple cognitive linguistic tasks for 15 minutes in quiet environment     Status  Achieved      SLP SHORT TERM GOAL #5   Title  Pt will perform min-mod complex organization/planning/sequencing/finance tasks with 85% accuracy and rare min A     Status  Achieved       SLP Long Term Goals - 03/18/18 1754      SLP LONG TERM GOAL #1   Title  pt will demo functional verbal output in 10 minutes complex conversation over three sessions    Baseline  03/18/18    Time  3    Period  Weeks    Status  On-going      SLP LONG TERM GOAL #2   Title  in 15 minutes mod complex/complex conversation pt will ask questions or comment appropriately to demo understanding, over 3 sessions    Baseline  03/18/18    Time  3    Period  Weeks    Status  On-going      SLP LONG TERM GOAL #3   Title  pt will demo alternating attention on mod complex cognitive linguistic tasks for 20 minutes in a mildly distracting environment    Baseline  03/04/18    Time  3    Period  Weeks    Status  On-going      SLP LONG TERM GOAL #4    Title  pt will complete mod complex-complex organization/planning/sequencing tasks with 90% success over three sessions    Baseline  03/04/18, 03/18/18  Time  3    Period  Weeks    Status  On-going      SLP LONG TERM GOAL #5   Title  pt will ID errors in cognitive linguistic tasks in 90% of opportunities.    Baseline  03/04/18    Time  3    Period  Weeks    Status  On-going       Plan - 03/18/18 1808    Clinical Impression Statement  Pt continues to demonstrate mild aphasia, impaired attention, memory and executive function. Conversational speech was [facility] today, however pt reports frustration at home not being able to "get my words out." Pt with decreased motivation to complete cognitive tasks at home; SLP has focused on working with pt and wife to brainstorm functional cognitive activities relating to his job. Continue skilled ST to maximize independence and QOL.    Speech Therapy Frequency  2x / week    Treatment/Interventions  Cognitive reorganization;Multimodal communcation approach;Compensatory strategies;Language facilitation;Cueing hierarchy;Internal/external aids;Functional tasks;SLP instruction and feedback;Patient/family education    Potential to Achieve Goals  Good    Consulted and Agree with Plan of Care  Patient;Family member/caregiver       Patient will benefit from skilled therapeutic intervention in order to improve the following deficits and impairments:   Cognitive communication deficit  Aphasia    Problem List Patient Active Problem List   Diagnosis Date Noted  . Moderate aortic stenosis 01/08/2018  . Ascending aorta dilatation (HCC) 01/08/2018  . Mixed hyperlipidemia 01/08/2018  . History of recent stroke 01/08/2018  . Hypoalbuminemia due to protein-calorie malnutrition ([place])   . Essential hypertension   . 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 Cerebrovascular accident (CVA) due to  thrombosis of left posterior cerebral artery ([place])   . Benign essential HTN   . Morbid obesity ([place])   . OSA (obstructive sleep apnea)   . Enteritis due to Clostridium difficile   . Acute CVA (cerebrovascular accident) ([place]) 12/01/2017  . Acute arterial ischemic stroke, multifocal, posterior circulation ([place]) 12/01/2017   Deneise Lever, MS, [place] 03/18/2018, 6:10 PM  [facility] [address] [place], [place], [address] Phone: [phone]   Fax:  [phone]   Name: Tamarius Rosenfield MRN: [id number] Date of Birth: 1966-09-25

## 2018-03-21 ENCOUNTER — Ambulatory Visit: Payer: PRIVATE HEALTH INSURANCE | Admitting: Speech Pathology

## 2018-03-21 DIAGNOSIS — R41841 Cognitive communication deficit: Secondary | ICD-10-CM | POA: Diagnosis not present

## 2018-03-21 DIAGNOSIS — R4701 Aphasia: Secondary | ICD-10-CM

## 2018-03-21 NOTE — Therapy (Signed)
[facility] [address] [place], [place], [address] Phone: [phone]   Fax:  [phone]  Speech Language Pathology Treatment  Patient Details  Name: William Lowery MRN: [id number] Date of Birth: Jul 18, 1966 Referring Provider: Dr. Alger Simons   Encounter Date: 03/21/2018  End of Session - 03/21/18 1836    Visit Number  11    Number of Visits  17    Date for SLP Re-Evaluation  03/29/18    SLP Start Time  2536    SLP Stop Time   6440    SLP Time Calculation (min)  45 min       Past Medical History:  Diagnosis Date  . Asthma    hx of  . Heart murmur    as an infant  . Hypertension   . OSA (obstructive sleep apnea)   . Sleep apnea    wears CPAP  . Stroke [facility])     Past Surgical History:  Procedure Laterality Date  . bellybutton procedure     as a newborn  . IR ANGIO INTRA EXTRACRAN SEL COM CAROTID INNOMINATE BILAT MOD SED  12/01/2017  . IR ANGIO VERTEBRAL SEL VERTEBRAL UNI R MOD SED  12/01/2017  . IR CT HEAD LTD  12/01/2017  . IR INTRA CRAN STENT  12/01/2017  . IR PERCUTANEOUS ART THROMBECTOMY/INFUSION INTRACRANIAL INC DIAG ANGIO  12/01/2017  . IR RADIOLOGIST EVAL & MGMT  02/12/2018  . RADIOLOGY WITH ANESTHESIA N/A 12/01/2017   Procedure: RADIOLOGY WITH ANESTHESIA CODE STROKE;  Surgeon: Radiologist, Medication, MD;  Location: [facility];  Service: Radiology;  Laterality: N/A;  . TEE WITHOUT CARDIOVERSION N/A 12/06/2017   Procedure: TRANSESOPHAGEAL ECHOCARDIOGRAM (TEE);  Surgeon: Pixie Casino, MD;  Location: [facility] ENDOSCOPY;  Service: Cardiovascular;  Laterality: N/A;    There were no vitals filed for this visit.  Subjective Assessment - 03/21/18 1823    Subjective  Pt reports keeping to-do lists at work this week.    Patient is accompained by:  Family member    Currently in Pain?  No/denies            ADULT SLP TREATMENT - 03/21/18 1400      General Information   Behavior/Cognition  Alert;Pleasant  mood;Cooperative    Patient Positioning  Upright in chair      Treatment Provided   Treatment provided  Cognitive-Linquistic      Pain Assessment   Pain Assessment  No/denies pain      Cognitive-Linquistic Treatment   Treatment focused on  Cognition;Patient/family/caregiver education    Skilled Treatment  SLP targeted planning, reasoning and organization in mod complex task in mildly distracting environment; pt maintained alternating attention between the task and conversation with SLP for 20 minutes. Accuracy was 85%; pt ID'd error with min verbal cue and corrected. SLP worked with pt on functional problem solving for strategies to address potential difficulties at work. Pt demo'd understanding of mod-complex conversation, asking appropriate questions and generating suggestions for ways to break larger tasks into smaller, more manageable ones.       Assessment / Recommendations / Plan   Plan  Continue with current plan of care;Goals updated      Progression Toward Goals   Progression toward goals  Progressing toward goals       SLP Education - 03/21/18 1835    Education provided  Yes    Education Details  break down larger tasks into smaller ones    Person(s) Educated  Patient;Spouse  Methods  Explanation;Handout    Comprehension  Verbalized understanding       SLP Short Term Goals - 03/21/18 1424      SLP SHORT TERM GOAL #1   Title  pt will use compensations for anomia in 10 minutes mod complex conversation     Status  Deferred      SLP SHORT TERM GOAL #2   Title  pt will name 8 items in mod abstract/complex category with rare min A     Status  Deferred      SLP SHORT TERM GOAL #3   Title  pt will participate in further assessment of language (reading, writing) and cognition and goals added PRN    Status  Achieved      SLP SHORT TERM GOAL #4   Title  pt will demo alternating attention on simple cognitive linguistic tasks for 15 minutes in quiet environment     Status   Achieved      SLP [place] #5   Title  Pt will perform min-mod complex organization/planning/sequencing/finance tasks with 85% accuracy and rare min A     Status  Achieved       SLP Long Term Goals - 03/21/18 1425      SLP LONG TERM GOAL #1   Title  pt will demo functional verbal output in 10 minutes complex conversation over three sessions    Baseline  03/18/18, 03/21/18    Time  3    Period  Weeks    Status  On-going      SLP LONG TERM GOAL #2   Title  in 15 minutes mod complex/complex conversation pt will ask questions or comment appropriately to demo understanding, over 3 sessions    Baseline  03/18/18, 03/21/18    Time  3    Period  Weeks    Status  On-going      SLP LONG TERM GOAL #3   Title  pt will demo alternating attention on mod complex cognitive linguistic tasks for 20 minutes in a mildly distracting environment    Baseline  03/04/18    Time  3    Period  Weeks    Status  On-going      SLP LONG TERM GOAL #4   Title  pt will complete mod complex-complex organization/planning/sequencing tasks with 90% success over three sessions    Baseline  03/04/18, 03/18/18    Time  3    Period  Weeks    Status  On-going      SLP LONG TERM GOAL #5   Title  pt will ID errors in cognitive linguistic tasks in 90% of opportunities.    Baseline  03/04/18    Time  3    Period  Weeks    Status  On-going       Plan - 03/21/18 1836    Clinical Impression Statement  Pt continues to demonstrate mild aphasia, impaired attention, memory and executive function. Conversational speech was [facility] today, however pt reports frustration at home not being able to "get my words out." Pt with decreased motivation to complete cognitive tasks at home; SLP has focused on working with pt and wife to brainstorm functional cognitive activities relating to his job. Continue skilled ST to maximize independence and QOL.       Patient will benefit from skilled therapeutic intervention in order to improve  the following deficits and impairments:   Cognitive communication deficit  Aphasia    Problem  List Patient Active Problem List   Diagnosis Date Noted  . Moderate aortic stenosis 01/08/2018  . Ascending aorta dilatation (HCC) 01/08/2018  . Mixed hyperlipidemia 01/08/2018  . History of recent stroke 01/08/2018  . Hypoalbuminemia due to protein-calorie malnutrition ([place])   . Essential hypertension   . 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 Cerebrovascular accident (CVA) due to thrombosis of left posterior cerebral artery ([place])   . Benign essential HTN   . Morbid obesity ([place])   . OSA (obstructive sleep apnea)   . Enteritis due to Clostridium difficile   . Acute CVA (cerebrovascular accident) ([place]) 12/01/2017  . Acute arterial ischemic stroke, multifocal, posterior circulation ([place]) 12/01/2017   Deneise Lever, MS, [place] 03/21/2018, 6:37 PM  [facility] [address] [place], [place], [address] Phone: [phone]   Fax:  [phone]   Name: Jaquavious Mercer MRN: [id number] Date of Birth: [birth year]

## 2018-03-21 NOTE — Patient Instructions (Addendum)
   Come up with some contracts and pricing for some clients and potential clients. Bring with you to next session.   Come up with a plan for phone calls: what calls do you need to accept, which ones can be fielded by someone else?  Plan for emails; break organization down into smaller tasks, schedule them and tackle them one by one (eg. Delete junk mail, file "recurring emails" etc)

## 2018-03-25 ENCOUNTER — Encounter: Payer: Self-pay | Admitting: Physical Medicine & Rehabilitation

## 2018-03-25 ENCOUNTER — Telehealth: Payer: Self-pay | Admitting: Physical Medicine & Rehabilitation

## 2018-03-25 ENCOUNTER — Ambulatory Visit: Payer: PRIVATE HEALTH INSURANCE | Admitting: Speech Pathology

## 2018-03-25 ENCOUNTER — Encounter: Payer: PRIVATE HEALTH INSURANCE | Attending: Registered Nurse | Admitting: Physical Medicine & Rehabilitation

## 2018-03-25 ENCOUNTER — Other Ambulatory Visit: Payer: Self-pay

## 2018-03-25 VITALS — BP 130/87 | HR 61 | Ht 70.0 in | Wt 283.6 lb

## 2018-03-25 DIAGNOSIS — G4730 Sleep apnea, unspecified: Secondary | ICD-10-CM | POA: Diagnosis not present

## 2018-03-25 DIAGNOSIS — R4701 Aphasia: Secondary | ICD-10-CM

## 2018-03-25 DIAGNOSIS — I63332 Cerebral infarction due to thrombosis of left posterior cerebral artery: Secondary | ICD-10-CM

## 2018-03-25 DIAGNOSIS — Z86718 Personal history of other venous thrombosis and embolism: Secondary | ICD-10-CM | POA: Insufficient documentation

## 2018-03-25 DIAGNOSIS — I10 Essential (primary) hypertension: Secondary | ICD-10-CM | POA: Insufficient documentation

## 2018-03-25 DIAGNOSIS — R41841 Cognitive communication deficit: Secondary | ICD-10-CM

## 2018-03-25 DIAGNOSIS — I6930 Unspecified sequelae of cerebral infarction: Secondary | ICD-10-CM | POA: Insufficient documentation

## 2018-03-25 NOTE — Progress Notes (Signed)
Subjective:    Patient ID: William Lowery, male    DOB: 1966/02/13, 53 y.o.   MRN: [id number]  HPI   William Lowery is here in follow up of his left PCA. He continues with SLP working on complex problem solving and higher level cognitive skills.  He feels that its "a waste of time".  He went back to work for 5 hours this past Friday and it was too much for him.  He lost balance when going down the stairs.  Likely his significant other was there to help him.  She has noticed that he does much better cognitively when at work but struggles when at home.  He becomes more emotional at times at home when he is unable to use his right hand like he wants to.  They have purchased some weighted utensils and devices to help with his coordination.  He does seem to shy away from using the right hand for activities at home however.  He denies being depressed but can become tearful at times when he struggles with something at home.  They feel that others do not always appreciate what is going on because on the outside he looks completely normal.  He denies any pain.  Pain Inventory Average Pain 0 Pain Right Now 0 My pain is tingling  In the last 24 hours, has pain interfered with the following? General activity 0 Relation with others 0 Enjoyment of life 0 What TIME of day is your pain at its worst? no pain Sleep (in general) Good  Pain is worse with: some activites Pain improves with: no pain Relief from Meds: no pain  Mobility Do you have any goals in this area?  no  Function employed # of hrs/week 0  Neuro/Psych numbness tingling  Prior Studies Any changes since last visit?  no  Physicians involved in your care Any changes since last visit?  no   Family History  Problem Relation Age of Onset  . COPD Mother   . Stroke Paternal Uncle   . Colon cancer Neg Hx   . Esophageal cancer Neg Hx   . Rectal cancer Neg Hx   . Stomach cancer Neg Hx    Social History   Socioeconomic History  .  Marital status: Married    Spouse name: Not on file  . Number of children: Not on file  . Years of education: Not on file  . Highest education level: Not on file  Occupational History  . Not on file  Social Needs  . Financial resource strain: Not on file  . Food insecurity:    Worry: Not on file    Inability: Not on file  . Transportation needs:    Medical: Not on file    Non-medical: Not on file  Tobacco Use  . Smoking status: Never Smoker  . Smokeless tobacco: Former Systems developer    Types: Snuff  Substance and Sexual Activity  . Alcohol use: Yes    Alcohol/week: 0.0 oz    Comment: socially  . Drug use: No  . Sexual activity: Not on file  Lifestyle  . Physical activity:    Days per week: Not on file    Minutes per session: Not on file  . Stress: Not on file  Relationships  . Social connections:    Talks on phone: Not on file    Gets together: Not on file    Attends religious service: Not on file    Active member of club  or organization: Not on file    Attends meetings of clubs or organizations: Not on file    Relationship status: Not on file  Other Topics Concern  . Not on file  Social History Narrative  . Not on file   Past Surgical History:  Procedure Laterality Date  . bellybutton procedure     as a newborn  . IR ANGIO INTRA EXTRACRAN SEL COM CAROTID INNOMINATE BILAT MOD SED  12/01/2017  . IR ANGIO VERTEBRAL SEL VERTEBRAL UNI R MOD SED  12/01/2017  . IR CT HEAD LTD  12/01/2017  . IR INTRA CRAN STENT  12/01/2017  . IR PERCUTANEOUS ART THROMBECTOMY/INFUSION INTRACRANIAL INC DIAG ANGIO  12/01/2017  . IR RADIOLOGIST EVAL & MGMT  02/12/2018  . RADIOLOGY WITH ANESTHESIA N/A 12/01/2017   Procedure: RADIOLOGY WITH ANESTHESIA CODE STROKE;  Surgeon: Radiologist, Medication, MD;  Location: [facility];  Service: Radiology;  Laterality: N/A;  . TEE WITHOUT CARDIOVERSION N/A 12/06/2017   Procedure: TRANSESOPHAGEAL ECHOCARDIOGRAM (TEE);  Surgeon: Pixie Casino, MD;  Location: [facility] ENDOSCOPY;   Service: Cardiovascular;  Laterality: N/A;   Past Medical History:  Diagnosis Date  . Asthma    hx of  . Heart murmur    as an infant  . Hypertension   . OSA (obstructive sleep apnea)   . Sleep apnea    wears CPAP  . Stroke ([place])    BP 130/87   Pulse 61   Ht 5\' 10"  (1.778 m)   Wt 283 lb 9.6 oz (128.6 kg)   SpO2 96%   BMI 40.69 kg/m   Opioid Risk Score:   Fall Risk Score:  `1  Depression screen PHQ 2/9  Depression screen [facility] 2/9 03/25/2018 09/07/2016 11/05/2015  Decreased Interest 0 0 0  Down, Depressed, Hopeless 0 0 0  PHQ - 2 Score 0 0 0    Review of Systems  Constitutional: Negative.   HENT: Negative.   Eyes: Negative.   Respiratory: Negative.   Cardiovascular: Negative.   Gastrointestinal: Negative.   Endocrine: Negative.   Musculoskeletal: Negative.   Skin: Negative.   Allergic/Immunologic: Negative.   Neurological: Positive for tremors and numbness.  Hematological: Negative.   Psychiatric/Behavioral: Negative.   All other systems reviewed and are negative.      Objective:   Physical Exam  General: No acute distress HEENT: EOMI, oral membranes moist Cards: reg rate  Chest: normal effort Abdomen: Soft, NT, ND Skin: dry, intact Extremities: no edema Musculoskeletal: No peripheral edema Neurological: He is alert.   Motor: RUE 4+/5.  Right lower extremity   4+ out of 5.  Patient with decreased fine motor coordination of the right arm during finger to nose testing.  May have mild intentional tremor in addition ataxia.  he struggled bringing a bottle of water to his mouth. He ambulatd fairly well. Tandem gait caused loss of balance to right. . Confrontation normal. Improved insight and awareness.  Skin: Skin is warm and dry.  Vascular changes b/l LE Psychiatric: Pleasant and appropriate         Assessment & Plan:  1. 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 Gave him guidelines for returning to work. I would recommend  no more than 2-3 hour days initially. 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   -he is not allowed to drive 2.  Right-sided ataxia/tremor.  Needs to continue addressing use and coordination at home. I'm not convinced medication will treat this given the ataxic component. If he continues to struggle, I would consider a trial of propranolol.  I agree with the weighted utensils and equipment. 3. Mood:  Sttill some reactive mood changes likely born out of frustration and fatigue at certain moments. I don't feel he requires an antidepressant at this point 7. Hx of c diff, completed vancomycin.              -continue probiotic 8.History of hypertension. Cozaar 50 mg daily -per primary 9.  OSA. continueCPAP at home   Thirty minutes of face to face patient care time were spent during this visit. All questions were encouraged and answered.   Greater than 50% of the time during this encounter was spent counseling the patient and family in regards to his recovery and potential for return to work.Marland Kitchen

## 2018-03-25 NOTE — Telephone Encounter (Signed)
Wife would like to know when patient will be able to drive again?  They forgot to ask him about it today when they were in office.  Please call wife.

## 2018-03-25 NOTE — Patient Instructions (Signed)
RECOMMEND WORKING 2-3 HOUR DAYS INITIALLY FOR THE NEXT 1-2 WEEKS THEN INCREASE BY ONE HOUR PER DAY PER WEEK OR TWO WEEKS AS YOU TOLERATE   KEEP YOURSELF ORGANIZED. KEEP A QUIET ENVIRONMENT. ASK THAT OTHER RESPECT YOUR NEEDS.

## 2018-03-25 NOTE — Patient Instructions (Signed)
Work on the list Dr. Naaman Plummer asked you to make

## 2018-03-25 NOTE — Therapy (Signed)
[facility] [address] [place], [place], [address] Phone: [phone]   Fax:  [phone]  Speech Language Pathology Treatment  Patient Details  Name: William Lowery MRN: [id number] Date of Birth: Dec 08, 1965 Referring Provider: Dr. Alger Simons   Encounter Date: 03/25/2018  End of Session - 03/25/18 1816    Visit Number  12    Number of Visits  17    Date for SLP Re-Evaluation  03/29/18    SLP Start Time  7619    SLP Stop Time   1415    SLP Time Calculation (min)  1365 min    Activity Tolerance  Patient tolerated treatment well       Past Medical History:  Diagnosis Date  . Asthma    hx of  . Heart murmur    as an infant  . Hypertension   . OSA (obstructive sleep apnea)   . Sleep apnea    wears CPAP  . Stroke [facility])     Past Surgical History:  Procedure Laterality Date  . bellybutton procedure     as a newborn  . IR ANGIO INTRA EXTRACRAN SEL COM CAROTID INNOMINATE BILAT MOD SED  12/01/2017  . IR ANGIO VERTEBRAL SEL VERTEBRAL UNI R MOD SED  12/01/2017  . IR CT HEAD LTD  12/01/2017  . IR INTRA CRAN STENT  12/01/2017  . IR PERCUTANEOUS ART THROMBECTOMY/INFUSION INTRACRANIAL INC DIAG ANGIO  12/01/2017  . IR RADIOLOGIST EVAL & MGMT  02/12/2018  . RADIOLOGY WITH ANESTHESIA N/A 12/01/2017   Procedure: RADIOLOGY WITH ANESTHESIA CODE STROKE;  Surgeon: Radiologist, Medication, MD;  Location: [facility];  Service: Radiology;  Laterality: N/A;  . TEE WITHOUT CARDIOVERSION N/A 12/06/2017   Procedure: TRANSESOPHAGEAL ECHOCARDIOGRAM (TEE);  Surgeon: Pixie Casino, MD;  Location: [facility] ENDOSCOPY;  Service: Cardiovascular;  Laterality: N/A;    There were no vitals filed for this visit.  Subjective Assessment - 03/25/18 1802    Subjective  Pt reports he went to work for 5 hours on Friday: "It was too much."    Patient is accompained by:  Family member    Currently in Pain?  No/denies            ADULT SLP TREATMENT -  03/25/18 1530      General Information   Behavior/Cognition  Alert;Pleasant mood;Cooperative    Patient Positioning  Upright in chair      Treatment Provided   Treatment provided  Cognitive-Linquistic      Pain Assessment   Pain Assessment  No/denies pain      Cognitive-Linquistic Treatment   Treatment focused on  Cognition;Patient/family/caregiver education    Skilled Treatment  Pt did not complete work-related assigned tasks (planning/setting boundaries for work and ID'ing tasks he should not yet be responsible for). Pt, wife with questions re: driving after meeting with Dr. Naaman Plummer today. I explained that driving is not just a physical but a cognitive activity, and that given pt's deficits in higher level attention, driving is not recommended at this time; they verbalized understanding. They did confirm they received information re: driving evaluation from OT but are not sure they want to pursue at this time. SLP provided counseling re: cognitive impacts of pt's stroke. Pt stated he felt overwhelmed and distracted by interruptions at the office; I reinforced Dr. 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 I encouraged him to create a daily schedule  to adhere to when he is in the office, setting himself up for success by taking on small tasks one at a time, taking breaks when needed. I encouraged his wife to support pt in setting boundaries but also encouraged her to let pt set boundaries vs. "protecting" him from coworkers. Pt wrote a 6-item potential schedule for a 2-3 hour day at work.       Assessment / Recommendations / Plan   Plan  Continue with current plan of care;Goals updated      Progression Toward Goals   Progression toward goals  Progressing toward goals       SLP Education - 03/25/18 1815    Education provided  Yes    Education Details  use of routines, schedules at work, create boundaries  to reduce interruptions and distractions    Person(s) Educated  Patient;Spouse    Methods  Explanation    Comprehension  Verbalized understanding       SLP Short Term Goals - 03/25/18 1818      SLP SHORT TERM GOAL #1   Title  pt will use compensations for anomia in 10 minutes mod complex conversation     Status  Deferred      SLP [place] #2   Title  pt will name 8 items in mod abstract/complex category with rare min A     Status  Deferred      SLP SHORT TERM GOAL #3   Title  pt will participate in further assessment of language (reading, writing) and cognition and goals added PRN    Status  Achieved      SLP SHORT TERM GOAL #4   Title  pt will demo alternating attention on simple cognitive linguistic tasks for 15 minutes in quiet environment     Status  Achieved      SLP [place] #5   Title  Pt will perform min-mod complex organization/planning/sequencing/finance tasks with 85% accuracy and rare min A     Status  Achieved       SLP Long Term Goals - 03/25/18 1819      SLP LONG TERM GOAL #1   Title  pt will demo functional verbal output in 10 minutes complex conversation over three sessions    Baseline  03/18/18, 03/21/18, 03/25/18    Time  2    Period  Weeks    Status  Achieved      SLP LONG TERM GOAL #2   Title  in 15 minutes mod complex/complex conversation pt will ask questions or comment appropriately to demo understanding, over 3 sessions    Baseline  03/18/18, 03/21/18, 03/25/18    Time  2    Period  Weeks    Status  Achieved      SLP LONG TERM GOAL #3   Title  pt will demo alternating attention on mod complex cognitive linguistic tasks for 20 minutes in a mildly distracting environment    Time  2    Period  Weeks    Status  On-going      SLP LONG TERM GOAL #4   Title  pt will complete mod complex-complex organization/planning/sequencing tasks with 90% success over three sessions    Baseline  03/04/18, 03/18/18    Time  2    Period  Weeks     Status  On-going      SLP LONG TERM GOAL #5   Title  pt will ID errors in cognitive linguistic  tasks in 90% of opportunities.    Time  2    Period  Weeks    Status  On-going       Plan - 03/25/18 1825    Clinical Impression Statement  Pt continues to display impairments in higher level attention, memory and executive function. Conversational speech was [facility] today. Pt with decreased motivation to complete cognitive tasks at home. SLP has focused on working with pt and wife to brainstorm functional cognitive activities relating to his job, however pt's completion of these tasks has been inconsistent. Discussed pt's progress with pt and wife over last 2 sessions and they are in agreement with d/c next session. Continue skilled ST to maximize independence and QOL.    Speech Therapy Frequency  2x / week    Treatment/Interventions  Cognitive reorganization;Multimodal communcation approach;Compensatory strategies;Language facilitation;Cueing hierarchy;Internal/external aids;Functional tasks;SLP instruction and feedback;Patient/family education    Potential to Achieve Goals  Good    Potential Considerations  Cooperation/participation level    Consulted and Agree with Plan of Care  Patient;Family member/caregiver       Patient will benefit from skilled therapeutic intervention in order to improve the following deficits and impairments:   Cognitive communication deficit  Aphasia    Problem List Patient Active Problem List   Diagnosis Date Noted  . Moderate aortic stenosis 01/08/2018  . Ascending aorta dilatation (HCC) 01/08/2018  . Mixed hyperlipidemia 01/08/2018  . History of recent stroke 01/08/2018  . Hypoalbuminemia due to protein-calorie malnutrition ([place])   . Essential hypertension   . 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 Cerebrovascular accident (CVA) due to thrombosis of left posterior cerebral artery ([place])   .  Benign essential HTN   . Morbid obesity ([place])   . OSA (obstructive sleep apnea)   . Enteritis due to Clostridium difficile   . Acute CVA (cerebrovascular accident) ([place]) 12/01/2017  . Acute arterial ischemic stroke, multifocal, posterior circulation ([place]) 12/01/2017   Deneise Lever, MS, [place] 03/25/2018, 6:27 PM  [facility] [address] [place], [place], [address] Phone: [phone]   Fax:  [phone]   Name: Kao Conry MRN: [id number] Date of Birth: August 25, 1966

## 2018-03-28 ENCOUNTER — Ambulatory Visit: Payer: PRIVATE HEALTH INSURANCE | Admitting: Speech Pathology

## 2018-03-28 DIAGNOSIS — R4701 Aphasia: Secondary | ICD-10-CM

## 2018-03-28 DIAGNOSIS — R41841 Cognitive communication deficit: Secondary | ICD-10-CM | POA: Diagnosis not present

## 2018-03-28 NOTE — Therapy (Signed)
[facility] [address] [place], [place], [address] Phone: [phone]   Fax:  [phone]  Speech Language Pathology Treatment and Discharge Summary  Patient Details  Name: William Lowery MRN: [id number] Date of Birth: [birth year] Referring Provider: Dr. Alger Simons   Encounter Date: 03/28/2018  End of Session - 03/28/18 1901    Visit Number  13    Number of Visits  17    Date for SLP Re-Evaluation  03/29/18    SLP Start Time  1321 pt arrived 6 min late    SLP Stop Time   1400    SLP Time Calculation (min)  39 min    Activity Tolerance  Patient tolerated treatment well       Past Medical History:  Diagnosis Date  . Asthma    hx of  . Heart murmur    as an infant  . Hypertension   . OSA (obstructive sleep apnea)   . Sleep apnea    wears CPAP  . Stroke [facility])     Past Surgical History:  Procedure Laterality Date  . bellybutton procedure     as a newborn  . IR ANGIO INTRA EXTRACRAN SEL COM CAROTID INNOMINATE BILAT MOD SED  12/01/2017  . IR ANGIO VERTEBRAL SEL VERTEBRAL UNI R MOD SED  12/01/2017  . IR CT HEAD LTD  12/01/2017  . IR INTRA CRAN STENT  12/01/2017  . IR PERCUTANEOUS ART THROMBECTOMY/INFUSION INTRACRANIAL INC DIAG ANGIO  12/01/2017  . IR RADIOLOGIST EVAL & MGMT  02/12/2018  . RADIOLOGY WITH ANESTHESIA N/A 12/01/2017   Procedure: RADIOLOGY WITH ANESTHESIA CODE STROKE;  Surgeon: Radiologist, Medication, MD;  Location: [facility];  Service: Radiology;  Laterality: N/A;  . TEE WITHOUT CARDIOVERSION N/A 12/06/2017   Procedure: TRANSESOPHAGEAL ECHOCARDIOGRAM (TEE);  Surgeon: Pixie Casino, MD;  Location: [facility] ENDOSCOPY;  Service: Cardiovascular;  Laterality: N/A;    There were no vitals filed for this visit.  Subjective Assessment - 03/28/18 1853    Subjective  Pt reports being at work for several hours today    Patient is accompained by:  Family member    Currently in Pain?  No/denies             ADULT SLP TREATMENT - 03/28/18 1321      General Information   Behavior/Cognition  Alert;Cooperative flat affect      Treatment Provided   Treatment provided  Cognitive-Linquistic      Pain Assessment   Pain Assessment  No/denies pain      Cognitive-Linquistic Treatment   Treatment focused on  Cognition;Patient/family/caregiver education      Assessment / Recommendations / Plan   Plan  Discharge SLP treatment due to (comment)      Progression Toward Goals   Progression toward goals  -- goals partially met, education completed, pt discharged        SLP Education - 03/28/18 1901    Education provided  Yes    Education Details  routines, schedules at work, strategies to improve attention and memory    Person(s) Educated  Patient;Spouse    Methods  Explanation;Handout    Comprehension  Verbalized understanding       SLP Short Term Goals - 03/28/18 1913      SLP SHORT TERM GOAL #1   Title  pt will use compensations for anomia in 10 minutes mod complex conversation     Status  Deferred      SLP SHORT TERM GOAL #  2   Title  pt will name 8 items in mod abstract/complex category with rare min A     Status  Deferred      SLP SHORT TERM GOAL #3   Title  pt will participate in further assessment of language (reading, writing) and cognition and goals added PRN    Status  Achieved      SLP SHORT TERM GOAL #4   Title  pt will demo alternating attention on simple cognitive linguistic tasks for 15 minutes in quiet environment     Status  Achieved       SLP Long Term Goals - 03/28/18 [place] #1   Title  pt will demo functional verbal output in 10 minutes complex conversation over three sessions    Status  Achieved      SLP LONG TERM GOAL #2   Title  in 15 minutes mod complex/complex conversation pt will ask questions or comment appropriately to demo understanding, over 3 sessions    Status  Achieved      SLP LONG TERM GOAL #3   Title  pt  will demo alternating attention on mod complex cognitive linguistic tasks for 20 minutes in a mildly distracting environment    Time  1    Status  Partially Met      SLP LONG TERM GOAL #4   Title  pt will complete mod complex-complex organization/planning/sequencing tasks with 90% success over three sessions    Time  1    Period  Weeks    Status  Partially Met      SLP LONG TERM GOAL #5   Title  pt will ID errors in cognitive linguistic tasks in 90% of opportunities.    Time  1    Period  Weeks    Status  Achieved       Plan - 03/28/18 1902    Clinical Impression Statement  Pt continues to display impairments in higher level attention, memory and executive function. Conversational speech was [facility] today. Pt with decreased motivation to complete cognitive tasks outside of ST. SLP has focused on working with pt and wife to brainstorm functional cognitive activities relating to his job, however pt's completion of these tasks has been inconsistent. While pt has made progress overall in therapy, progress has slowed in recent visits as pt has shown decreased participation/willingness to implement cognitive strategies/complete functional cognitive activities outside of ST. I do not feel renewal of skilled ST warranted; pt and wife are in agreement with d/c at this time.     Speech Therapy Frequency  -- d/c    Treatment/Interventions  Cognitive reorganization;Multimodal communcation approach;Compensatory strategies;Language facilitation;Cueing hierarchy;Internal/external aids;Functional tasks;SLP instruction and feedback;Patient/family education    Potential to Achieve Goals  Fair    Potential Considerations  Cooperation/participation level    Consulted and Agree with Plan of Care  Patient;Family member/caregiver       Patient will benefit from skilled therapeutic intervention in order to improve the following deficits and impairments:   Cognitive communication deficit  Aphasia    Problem  List Patient Active Problem List   Diagnosis Date Noted  . Moderate aortic stenosis 01/08/2018  . Ascending aorta dilatation (HCC) 01/08/2018  . Mixed hyperlipidemia 01/08/2018  . History of recent stroke 01/08/2018  . Hypoalbuminemia due to protein-calorie malnutrition ([place])   . Essential hypertension   . 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  Cerebrovascular accident (CVA) due to thrombosis of left posterior cerebral artery ([place])   . Benign essential HTN   . Morbid obesity ([place])   . OSA (obstructive sleep apnea)   . Enteritis due to Clostridium difficile   . Acute CVA (cerebrovascular accident) ([place]) 12/01/2017  . Acute arterial ischemic stroke, multifocal, posterior circulation ([place]) 12/01/2017   SPEECH THERAPY DISCHARGE SUMMARY  Visits from Start of Care: 13  Current functional level related to goals / functional outcomes: Conversational speech WFL; pt continues to require cues to implement compensatory strategies for attention, memory. See goals above.    Remaining deficits: Deficits persist in higher level attention, memory and executive function.    Education / Equipment: Strategies for attention, memory.   Plan: Patient agrees to discharge.  Patient goals were partially met. Patient is being discharged due to lack of progress.  ?????     Deneise Lever, [place], CCC-SLP Speech-Language Pathologist   Aliene Altes 03/28/2018, 7:14 PM  [facility] [address] [place], [place], [address] Phone: [phone]   Fax:  [phone]   Name: William Lowery MRN: [id number] Date of Birth: 08-Feb-1966

## 2018-03-28 NOTE — Patient Instructions (Signed)
   Routines/Schedules  - Make a plan before you start your day - Establish a daily routine or schedule and stick to it  - Build in time to address priorities that come up - Think about having "office hours" where if someone has questions for you, they can come at a specific time to address those issues - At the end of the day, review your list/schedule, check off what you did, if there are new priorities that come up or things you didn't get, start a new list for tomorrow  Boundaries  - Decide what is a priority for you to handle.  - Decide what is not a priority for you to handle (make a list and designate who will be responsible for those things) Make sure people in your office know - Communicate what you need from people (signs on your door, etc)  Rest  - Stick to Dr. Charm Barges recommendation for how long you should spend working - Recognize the signs when you are getting tired or frustrated and TAKE A BREAK

## 2018-04-04 ENCOUNTER — Other Ambulatory Visit (HOSPITAL_COMMUNITY): Payer: Self-pay | Admitting: Interventional Radiology

## 2018-04-04 DIAGNOSIS — I639 Cerebral infarction, unspecified: Secondary | ICD-10-CM

## 2018-04-06 ENCOUNTER — Other Ambulatory Visit: Payer: Self-pay | Admitting: Physical Medicine & Rehabilitation

## 2018-04-11 ENCOUNTER — Ambulatory Visit (HOSPITAL_COMMUNITY)
Admission: RE | Admit: 2018-04-11 | Discharge: 2018-04-11 | Disposition: A | Discharge status: Home / Self Care | Payer: No Typology Code available for payment source | Source: Ambulatory Visit | Attending: Interventional Radiology | Admitting: Interventional Radiology

## 2018-04-11 DIAGNOSIS — I639 Cerebral infarction, unspecified: Secondary | ICD-10-CM

## 2018-04-11 HISTORY — PX: IR RADIOLOGIST EVAL & MGMT: IMG5224

## 2018-04-16 ENCOUNTER — Encounter (HOSPITAL_COMMUNITY): Payer: Self-pay | Admitting: Interventional Radiology

## 2018-05-06 NOTE — Progress Notes (Deleted)
STROKE NEUROLOGY FOLLOW UP NOTE  NAME: William Lowery DOB: 1966-01-19  REASON FOR VISIT: stroke follow up HISTORY FROM: wife and pt and chart  Today we had the pleasure of seeing William Lowery in follow-up at our [facility]rology [facility]. Pt was accompanied by wife.   History Summary William Lowery is a 52 y.o. male with history of asthma, hypertension, and OSA on CPAP admitted on 12/01/17 for right-sided numbness. The patient received TPA. CTA head and neck showed left P1 occlusion. Had IR with TICI2b reperfusion and s/p left P1 stent. MRI showed left PCA, right SCA and b/l PICA infarcts, as well as remote right PICA infarct. MRA showed left PCA occlusion even after stenting, and right SCA occlusion. EF 55-60% on TTE. No DVT. Hypercoagulable work up neg. However, TEE showed AV calcified atheroma vs. Vegetation. CTA chest and aorta showed  4.3 ascending thoracic aortic aneurysm but AV atheroma or vegetation were not well seen. LDL 58 and A1C 5.7. Blood culture neg. Cardiology recommend 30 day cardiac event monitoring instead of loop recorder. Pt was discharged to CIR on ASA and brilinta as well as lipitor 10.   Pt also had C. Diff during hospitalization. ID consulted and put on po vancomycin for 2 weeks and bowel supplement with yogurt.   02/05/2018 visit JX: During the interval time, the patient has been doing well. Still has right sided ataxia and dexterity difficulty with right hand. Had outpt PT/OT/speech. Not able to back to work or driving yet. Follows with Dr. Ranae Plumber in [place]. He also followed with Dr. Debara Pickett and considered AV calcified atheroma and did not recommend further cardiac monitoring. Will repeat TTE in one year. Has not seen Dr. Estanislado Pandy yet. On ASA and brilinta without side effect. Will has thyroid US today to follow up with right thyroid nodule found on CTA neck. BP today 125/88.   05/07/2018 update:  REVIEW OF SYSTEMS: Full 14 system review of systems performed and notable only for  those listed below and in HPI above, all others are negative:  Constitutional:   Cardiovascular:  Ear/Nose/Throat:   Skin:  Eyes:   Respiratory:   Gastroitestinal:   Genitourinary:  Hematology/Lymphatic:   Endocrine:  Musculoskeletal:   Allergy/Immunology:   Neurological:   Psychiatric:  Sleep:   The following represents the patient's updated allergies and side effects list: No Known Allergies  The neurologically relevant items on the patient's problem list were reviewed on today's visit.  Neurologic Examination  A problem focused neurological exam (12 or more points of the single system neurologic examination, vital signs counts as 1 point, cranial nerves count for 8 points) was performed.  There were no vitals taken for this visit.  General - Well nourished, well developed, in no apparent distress.  Ophthalmologic - Fundi not visualized due to eye movement.  Cardiovascular - Regular rate and rhythm  Head - alopicia at occipital region  Mental Status -  Level of arousal and orientation to time, place, and person were intact. Language including expression, naming, repetition, comprehension was assessed and found intact. Fund of Knowledge was assessed and was intact.  Cranial Nerves II - XII - II - Visual field intact except RUQ decreased visual acuity and simultagnosia. III, IV, VI - Extraocular movements intact. V - Facial sensation intact bilaterally. VII - Facial movement intact bilaterally. VIII - Hearing & vestibular intact bilaterally. X - Palate elevates symmetrically. XI - Chin turning & shoulder shrug intact bilaterally. XII - Tongue protrusion intact.  Motor  Strength - The patient's strength was normal in all extremities except decreased right hand dexterity and pronator drift was absent.  Bulk was normal and fasciculations were absent.   Motor Tone - Muscle tone was assessed at the neck and appendages and was normal.  Reflexes - The patient's reflexes  were 1+ in all extremities and he had no pathological reflexes.  Sensory - Light touch, temperature/pinprick were assessed and were normal.    Coordination - The patient had ataxia at right arm and leg, arm > leg.   Gait and Station - broad based gait   Functional score  mRS = 2   0 - No symptoms.   1 - No significant disability. Able to carry out all usual activities, despite some symptoms.   2 - Slight disability. Able to look after own affairs without assistance, but unable to carry out all previous activities.   3 - Moderate disability. Requires some help, but able to walk unassisted.   4 - Moderately severe disability. Unable to attend to own bodily needs without assistance, and unable to walk unassisted.   5 - Severe disability. Requires constant nursing care and attention, bedridden, incontinent.   6 - Dead.   NIH Stroke Scale   Level Of Consciousness 0=Alert; keenly responsive 1=Not alert, but arousable by minor stimulation 2=Not alert, requires repeated stimulation 3=Responds only with reflex movements 0  LOC Questions to Month and Age 56=Answers both questions correctly 1=Answers one question correctly 2=Answers neither question correctly 0  LOC Commands      -Open/Close eyes     -Open/close grip 0=Performs both tasks correctly 1=Performs one task correctly 2=Performs neighter task correctly 0  Best Gaze 0=Normal 1=Partial gaze palsy 2=Forced deviation, or total gaze paresis 0  Visual 0=No visual loss 1=Partial hemianopia 2=Complete hemianopia 3=Bilateral hemianopia (blind including cortical blindness) 1  Facial Palsy 0=Normal symmetrical movement 1=Minor paralysis (asymmetry) 2=Partial paralysis (lower face) 3=Complete paralysis (upper and lower face) 0  Motor  0=No drift, limb holds posture for full 10 seconds 1=Drift, limb holds posture, no drift to bed 2=Some antigravity effort, cannot maintain posture, drifts to bed 3=No effort against gravity,  limb falls 4=No movement Right Arm 0     Leg 0    Left Arm 0     Leg 0  Limb Ataxia 0=Absent 1=Present in one limb 2=Present in two limbs 2  Sensory 0=Normal 1=Mild to moderate sensory loss 2=Severe to total sensory loss 0  Best Language 0=No aphasia, normal 1=Mild to moderate aphasia 2=Mute, global aphasia 3=Mute, global aphasia 0  Dysarthria 0=Normal 1=Mild to moderate 2=Severe, unintelligible or mute/anarthric 0  Extinction/Neglect 0=No abnormality 1=Extinction to bilateral simultaneous stimulation 2=Profound neglect 1  Total   4     Data reviewed: I personally reviewed the images and agree with the radiology interpretations.  Ct Angio Head W Or Wo Contrast Ct Angio Neck W Or Wo Contrast  Ct Cerebral Perfusion W Contrast 12/01/2017 IMPRESSION:  1. 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 14 cc of left PCA distribution penumbra with no infarct by CTP.  2. 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 No embolic source identified in the head or neck. No atheromatous changes or chronic stenoses.  3. 2.5 cm right thyroid nodule, recommend sonographic follow-up.   Ct Head Wo Contrast 12/01/2017 IMPRESSION:  1. Subarachnoid hyperdensity within the posterior left temporal lobe and left occipital lobe, which could be contrast staining or petechial/subarachnoid hemorrhage.  2. No herniation, hydrocephalus  or intraparenchymal hematoma.   Ct Head Code Stroke Wo Contrast 12/01/2017 IMPRESSION:  1. Possible hyperdense left P1 segment.  2. Negative for hemorrhage or visible acute infarct.  3. Small remote right inferior cerebellar infarct.   Mri and Mra Head Wo Contrast 12/03/2017 IMPRESSION: 1. Multifocal acute ischemia within the posterior circulation territories, predominantly within the left PCA distribution, including the left occipital lobe and left thalamus. Numerous smaller foci of acute ischemia throughout both cerebellar hemispheres. 2. Minimal  susceptibility blooming in the left occipital lobe, likely indicating the presence of a minimal amount of petechial blood. 3. Stent within the P1 segment of the left PCA without flow related enhancement distal to the stent. The stent may be occluded. This could be further assessed with CTA of the head, if clinically warranted.   CT HEAD  1. Continued decrease in subarachnoid high-density along the posterior left cerebral hemisphere. No hydrocephalus. 2. Moderate acute left occipital infarct without detected progression. 3. Question small right superior cerebellar infarct.  Transthoracic Echocardiogram - Left ventricle: The cavity size was normal. Wall thickness was increased in a pattern of mild LVH. Systolic function was normal. The estimated ejection fraction was in the range of 55% to 60%. Wall motion was normal; there were no regional wall motion abnormalities. Left ventricular diastolic function parameters were normal. - Aortic valve: Moderately calcified annulus. Trileaflet; moderately thickened leaflets. There was moderate stenosis. There was mild regurgitation. Mean gradient (S): 22 mm Hg. Valve area (VTI): 1.36 cm^2. Valve area (Vmax): 1.17 cm^2. Valve area (Vmean): 1.25 cm^2. - Mitral valve: Mildly calcified annulus. Mildly thickened leaflets  Cerebral angiogram - Dr. Estanislado Pandy 12/01/2017 S/P bilateral common carotid and vertebral artery angiograms,followed By partial revascularization of Lt PCA with x 2 passes with solitaire 34mm x 40 mm retriever device ,x 1 pass with the embotrap retriever device And placement of intracranial stent in the Lt PCA for recurrent occlusion after retriever passes achieving a TICI 2b re pertfusion,followe by .75 mg og aggrastat IA into the basilar artery with slow revascularization of intrastent occlusion.due to acute platelet aggregation. Also loaded with brilinta 180 mg and aspirin 81 mg via orogastric tube prior to stent  placement  PORTABLE CHEST 1 VIEW   12/05/2017 COMPARISON: 12/03/2017 and earlier. IMPRESSION: 1. Extubated and enteric tube removed. 2. No acute cardiopulmonary abnormality.  Lower Venous Study 12/04/2017 at 1:21:55 PM. Final Interpretation: Right: There is no evidence of deep vein thrombosis in the lower extremity.There is no evidence of superficial venous thrombosis. No cystic structure found in the popliteal fossa. Left: There is no evidence of deep vein thrombosis in the lower extremity.There is no evidence of superficial venous thrombosis. No cystic structure found in the popliteal fossa.  TEE IMPRESSION:  8. No LAA thrombus 9. Negative for PFO 10. 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 11. Trileaflet, but thickened aortic valve with moderate stenosis - mean gradient 29 mmHg 12. Moderate to severe LVH 13. LVEF 65-70% 14. Small late right to left shunt, suggestive of intrapulmonary or intrahepatic shunting 15. Ascending aortic aneurysm, measuring at least 4.1 cm  CT ANGIOGRAPHY CHEST WITH CONTRAST 12/06/2017 21:01 IMPRESSION: 4.3 cm ascending thoracic aortic aneurysm.  Recommend annual imaging followup by CTA or MRA. Small bilateral pleural effusions with dependent atelectasis.  Component     Latest Ref Rng & Units 12/02/2017 12/04/2017 12/05/2017  Cholesterol     0 - 200 mg/dL 144  Triglycerides     <150 mg/dL 246 (H)    HDL Cholesterol     >40 mg/dL 37 (L)    Total CHOL/HDL Ratio     RATIO 3.9    VLDL     0 - 40 mg/dL 49 (H)    LDL (calc)     0 - 99 mg/dL 58    Alpha galactosidase, serum     28.0 - 80.0 nmol/hr/mg prt  34.5   Interpretation       Comment   Director Review       Comment   Methodology       Comment   DISCLAIMER       Comment   PTT Lupus Anticoagulant     0.0 - 51.9 sec  36.4   DRVVT     0.0 - 47.0 sec  34.9   Lupus Anticoag Interp       Comment:     Beta-2 Glycoprotein I Ab, IgG     0 - 20 GPI IgG units  <9   Beta-2-Glycoprotein I IgM     0 - 32 GPI IgM units  <9   Beta-2-Glycoprotein I IgA     0 - 25 GPI IgA units  <9   Anticardiolipin Ab,IgG,Qn     0 - 14 GPL U/mL  <9   Anticardiolipin Ab,IgM,Qn     0 - 12 MPL U/mL  11   Anticardiolipin Ab,IgA,Qn     0 - 11 APL U/mL  <9   Hemoglobin A1C     4.8 - 5.6 % 5.7 (H)    Mean Plasma Glucose     mg/dL 116.89    HIV Screen 4th Generation wRfx     Non Reactive Non Reactive    Homocysteine     0.0 - 15.0 umol/L  3.9   Vitamin B12     180 - 914 pg/mL  1,057 (H)   TSH     0.350 - 4.500 uIU/mL  0.288 (L)   RPR     Non Reactive  Non Reactive   ANA Ab, IFA       Negative   ds DNA Ab     0 - 9 IU/mL  <1   T4,Free(Direct)     0.61 - 1.12 ng/dL   0.80    Assessment: As you may recall, he is a 52 y.o. Caucasian male with PMH of asthma, hypertension, and OSA on CPAP admitted on 12/01/17 for right-sided numbness. s/p TPA. CTA head and neck showed left P1 occlusion. Had IR with TICI2b reperfusion and s/p left P1 stent. MRI showed left PCA, right SCA and b/l PICA infarcts, as well as remote right PICA infarct. MRA showed left PCA occlusion even after stenting, and right SCA occlusion. EF 55-60% on TTE. No DVT. Hypercoagulable work up neg. However, TEE showed AV calcified atheroma vs. Vegetation. CTA chest and aorta showed 4.3 ascending thoracic aortic aneurysm but AV atheroma or vegetation were not well seen. LDL 58 and A1C 5.7. Blood culture neg. Cardiology recommend 30 day cardiac event monitoring instead of loop recorder. Pt was discharged to CIR on ASA and brilinta as well as lipitor 10. Pt has been doing well after discharge. Still has right sided ataxia and dexterity difficulty with right hand. Had outpt PT/OT/speech. Followed with Dr. Debara Pickett and considered AV calcified atheroma as the cause of current and prior strokes, and did not recommend further cardiac monitoring. Recommend to repeat TTE  in one year.  Has not seen Dr. Estanislado Pandy yet. On ASA and brilinta without side effect.   Plan:  - continue ASA and brilinta for stroke prevention and for intracranial stent - follow up with Dr. Estanislado Pandy next week and ask the duration of brilinta - follow up with Dr. Debara Pickett for repeat heart ultrasound and aorta aneurysm.  - follow up with PCP for right thyroid nodule - Follow up with your primary care physician for stroke risk factor modification. Recommend maintain blood pressure goal <130/80, diabetes with hemoglobin A1c goal below 7.0% and lipids with LDL cholesterol goal below 70 mg/dL.  - continue PT/OT/speech - continue follow up with Dr. Ranae Plumber for work and driving clearance.  - continue use CPAP for sleepy apnea.  - healthy diet and regular exercise and lose weight - continue to observe diarrhea and follow up with Dr. Shelia Media - I will do some research work on the occipital hair loss and let you know.  - follow up in 3 months with Janett Billow.   Greater than 50% of time during this 25 minute visit was spent on counseling,explanation of diagnosis of ***, reviewing risk factor management of ***, planning of further management, discussion with patient and family and coordination of care  Venancio Poisson, [facility]  [facility] [address] [place], [place] [address]  Phone [phone] Fax [phone] Note: This document was prepared with digital dictation and possible smart phrase technology. Any transcriptional errors that result from this process are unintentional.

## 2018-05-07 ENCOUNTER — Encounter: Payer: Self-pay | Admitting: Adult Health

## 2018-05-07 ENCOUNTER — Other Ambulatory Visit: Payer: Self-pay | Admitting: Physical Medicine & Rehabilitation

## 2018-05-07 ENCOUNTER — Ambulatory Visit: Payer: No Typology Code available for payment source | Admitting: Adult Health

## 2018-05-07 ENCOUNTER — Telehealth: Payer: Self-pay

## 2018-05-07 NOTE — Telephone Encounter (Signed)
Patient no show for appt today. 

## 2018-05-22 ENCOUNTER — Telehealth: Payer: Self-pay | Admitting: Internal Medicine

## 2018-05-22 NOTE — Telephone Encounter (Signed)
New Message:      Wife said pt was seen  twice by Dr Debara Pickett in the hospital and here in the office 01-08-18. She needs a letter stating this for his insurance company. The insurance company is asking for records from 2015 until now. Pt was never seen here until 01-08-18.

## 2018-06-03 ENCOUNTER — Encounter
Payer: No Typology Code available for payment source | Attending: Registered Nurse | Admitting: Physical Medicine & Rehabilitation

## 2018-06-03 ENCOUNTER — Encounter: Payer: Self-pay | Admitting: Physical Medicine & Rehabilitation

## 2018-06-03 ENCOUNTER — Other Ambulatory Visit: Payer: Self-pay | Admitting: Physical Medicine & Rehabilitation

## 2018-06-03 ENCOUNTER — Encounter: Payer: Self-pay | Admitting: Internal Medicine

## 2018-06-03 VITALS — BP 137/88 | HR 60 | Ht 70.0 in | Wt 278.0 lb

## 2018-06-03 DIAGNOSIS — I63332 Cerebral infarction due to thrombosis of left posterior cerebral artery: Secondary | ICD-10-CM

## 2018-06-03 DIAGNOSIS — G4730 Sleep apnea, unspecified: Secondary | ICD-10-CM | POA: Diagnosis not present

## 2018-06-03 DIAGNOSIS — I6930 Unspecified sequelae of cerebral infarction: Secondary | ICD-10-CM | POA: Diagnosis not present

## 2018-06-03 DIAGNOSIS — I10 Essential (primary) hypertension: Secondary | ICD-10-CM | POA: Diagnosis not present

## 2018-06-03 DIAGNOSIS — Z86718 Personal history of other venous thrombosis and embolism: Secondary | ICD-10-CM | POA: Diagnosis not present

## 2018-06-03 MED ORDER — ASPIRIN 81 MG PO TBEC: 325.0000 mg | DELAYED_RELEASE_TABLET | Freq: Every day | ORAL | Status: DC

## 2018-06-03 MED ORDER — CLOPIDOGREL BISULFATE 75 MG PO TABS: 75.0000 mg | ORAL_TABLET | Freq: Every day | ORAL | 3 refills | Status: AC

## 2018-06-03 NOTE — Telephone Encounter (Signed)
LM for patient's wife that letter addressing dates MD has seen patient - first encounter in hospital Jan 2019 and office visit March 2019. Notified her in message that letter will be at front desk and can be picked up anytime M-F from 8a-5p

## 2018-06-03 NOTE — Progress Notes (Signed)
Subjective:    Patient ID: William Lowery, male    DOB: 1966/01/17, 52 y.o.   MRN: [id number]  HPI   Mr. Trabucco is here in follow-up of his left PCA infarct.  He has been doing well for the most part.  He has returned to work and is working 4-1/2-hour days.  He has not started driving yet.  He tried to increase his work time to 7-hour days but could not tolerate it due to to excessive fatigue.  When he works 4-1/2 hours he will come home and feel quite tired.  His sleep is improved overall although he does have anxiety at nighttime when he goes to bed regarding whether he will wake up the next morning or not.  He still is frustrated with his right upper extremity.  At work he is able to use a keyboard quite a bit so that is not a big problem but handwriting remains difficult.  He does have adaptive tools to use but seems to shy away from using them.  He does seemed generally shy away from using his right hand much on his own at home.  Pain Inventory Average Pain 0 Pain Right Now 0 My pain is tingling  In the last 24 hours, has pain interfered with the following? General activity 0 Relation with others 0 Enjoyment of life 0 What TIME of day is your pain at its worst? na Sleep (in general) Fair  Pain is worse with: na Pain improves with: na Relief from Meds: na  Mobility walk without assistance  Function employed # of hrs/week 3-4  Neuro/Psych numbness tremor tingling  Prior Studies Any changes since last visit?  no  Physicians involved in your care Any changes since last visit?  no   Family History  Problem Relation Age of Onset  . COPD Mother   . Stroke Paternal Uncle   . Colon cancer Neg Hx   . Esophageal cancer Neg Hx   . Rectal cancer Neg Hx   . Stomach cancer Neg Hx    Social History   Socioeconomic History  . Marital status: Married    Spouse name: Not on file  . Number of children: Not on file  . Years of education: Not on file  . Highest education  level: Not on file  Occupational History  . Not on file  Social Needs  . Financial resource strain: Not on file  . Food insecurity:    Worry: Not on file    Inability: Not on file  . Transportation needs:    Medical: Not on file    Non-medical: Not on file  Tobacco Use  . Smoking status: Never Smoker  . Smokeless tobacco: Former Systems developer    Types: Snuff  Substance and Sexual Activity  . Alcohol use: Yes    Alcohol/week: 0.0 oz    Comment: socially  . Drug use: No  . Sexual activity: Not on file  Lifestyle  . Physical activity:    Days per week: Not on file    Minutes per session: Not on file  . Stress: Not on file  Relationships  . Social connections:    Talks on phone: Not on file    Gets together: Not on file    Attends religious service: Not on file    Active member of club or organization: Not on file    Attends meetings of clubs or organizations: Not on file    Relationship status: Not on file  Other Topics Concern  . Not on file  Social History Narrative  . Not on file   Past Surgical History:  Procedure Laterality Date  . bellybutton procedure     as a newborn  . IR ANGIO INTRA EXTRACRAN SEL COM CAROTID INNOMINATE BILAT MOD SED  12/01/2017  . IR ANGIO VERTEBRAL SEL VERTEBRAL UNI R MOD SED  12/01/2017  . IR CT HEAD LTD  12/01/2017  . IR INTRA CRAN STENT  12/01/2017  . IR PERCUTANEOUS ART THROMBECTOMY/INFUSION INTRACRANIAL INC DIAG ANGIO  12/01/2017  . IR RADIOLOGIST EVAL & MGMT  02/12/2018  . IR RADIOLOGIST EVAL & MGMT  04/11/2018  . RADIOLOGY WITH ANESTHESIA N/A 12/01/2017   Procedure: RADIOLOGY WITH ANESTHESIA CODE STROKE;  Surgeon: Radiologist, Medication, MD;  Location: [facility];  Service: Radiology;  Laterality: N/A;  . TEE WITHOUT CARDIOVERSION N/A 12/06/2017   Procedure: TRANSESOPHAGEAL ECHOCARDIOGRAM (TEE);  Surgeon: Pixie Casino, MD;  Location: [facility] ENDOSCOPY;  Service: Cardiovascular;  Laterality: N/A;   Past Medical History:  Diagnosis Date  . Asthma    hx  of  . Heart murmur    as an infant  . Hypertension   . OSA (obstructive sleep apnea)   . Sleep apnea    wears CPAP  . Stroke [facility])    There were no vitals taken for this visit.  Opioid Risk Score:   Fall Risk Score:  `1  Depression screen PHQ 2/9  Depression screen [facility] 2/9 03/25/2018 09/07/2016 11/05/2015  Decreased Interest 0 0 0  Down, Depressed, Hopeless 0 0 0  PHQ - 2 Score 0 0 0     Review of Systems  Constitutional: Negative.   HENT: Negative.   Eyes: Negative.   Respiratory: Negative.   Cardiovascular: Negative.   Gastrointestinal: Negative.   Endocrine: Negative.   Genitourinary: Negative.   Musculoskeletal: Negative.   Skin: Negative.   Allergic/Immunologic: Negative.   Neurological: Positive for tremors and numbness.  Hematological: Negative.   Psychiatric/Behavioral: Negative.   All other systems reviewed and are negative.      Objective:   Physical Exam General: No acute distress HEENT: EOMI, oral membranes moist Cards: reg rate  Chest: normal effort Abdomen: Soft, NT, ND Skin: dry, intact Extremities: no edema Musculoskeletal: Minimal right hand edema Neurological: He is alert.  Motor:RUE 5/5.Right lower extremity 5/ 5. Improving dexterity of the right upper extremity.  Still struggles somewhat with finger-to-nose and finger to finger tasks.  He wrote his name for me and struggled with legibility.   Skin: Skin is warm and dry.  Vascular changes b/l LE Psychiatric: Pleasant and appropriate.  No signs of impulsivity or irritability       Assessment & Plan:  1. 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 Perhaps increase by 30 minutes per month.  2.Right-sided ataxia/tremor.  weighted utencils, and repetition.  The only way he will see improved dexterity is with use and repetition.   Discussed constraint-induced therapy as well 3. Mood:For the most part he has demonstrated ongoing improvement.   -Occasional frustration at times 7.Hx of c diff, completed vancomycin.  -continue probiotic 8. History of hypertension. Cozaar 50 mg daily -per primary 9. OSA.continueCPAP at home   15 minutes of face to face patient care time were spent during this visit. All questions were encouraged and answered.  Follow-up with me in 6 months.

## 2018-06-03 NOTE — Patient Instructions (Addendum)
RETURN TO DRIVING PLAN:  WITH THE SUPERVISION OF A LICENSED DRIVER, PLEASE DRIVE IN AN EMPTY PARKING LOT FOR AT LEAST 2-3 TRIALS TO TEST REACTION TIME, VISION, USE OF EQUIPMENT IN CAR, ETC.  IF SUCCESSFUL WITH THE PARKING LOT DRIVING, PROCEED TO SUPERVISED DRIVING TRIALS IN YOUR NEIGHBORHOOD STREETS AT LOW TRAFFIC TIMES TO TEST OBSERVATION TO TRAFFIC SIGNALS, REACTION TIME, ETC. PLEASE ATTEMPT AT LEAST 2-3 TRIALS IN YOUR NEIGHBORHOOD.  IF NEIGHBORHOOD DRIVING IS SUCCESSFUL, YOU MAY PROCEED TO DRIVING IN BUSIER AREAS IN YOUR COMMUNITY WITH SUPERVISION OF A LICENSED DRIVER. PLEASE ATTEMPT AT LEAST 4-5 TRIALS.  IF COMMUNITY DRIVING IS SUCCESSFUL, YOU MAY PROCEED TO DRIVING ALONE, DURING THE DAY TIME, IN NON-PEAK TRAFFIC TIMES. YOU SHOULD DRIVE NO FURTHER THAN 20 MINUTES IN ONE DIRECTION. PLEASE DO NOT DRIVE IF YOU FEEL FATIGUED OR UNDER THE INFLUENCE OF MEDICATION.

## 2018-08-13 ENCOUNTER — Other Ambulatory Visit: Payer: Self-pay | Admitting: Physical Medicine & Rehabilitation

## 2018-09-12 ENCOUNTER — Other Ambulatory Visit: Payer: Self-pay | Admitting: Physical Medicine & Rehabilitation

## 2018-09-12 ENCOUNTER — Telehealth: Payer: Self-pay | Admitting: Physician Assistant

## 2018-09-12 ENCOUNTER — Telehealth (HOSPITAL_COMMUNITY): Payer: Self-pay

## 2018-09-12 NOTE — Telephone Encounter (Signed)
Called to schedule f/u cta head/neck. Gave pt's wife the number to the pre-service center so she can find out the cost and she will call me back to schedule once she finds out. AW

## 2018-09-12 NOTE — Telephone Encounter (Signed)
  Refill request for Plavix 75 mg Daily.  Chart reviewed. Patient not taking Brilinta due to cost and changed to Plavix and 325 ASA per April 11, 2018 note.  Also per that note, patient needs CT angio of head and neck scheduled soon.  I have informed Caryl Pina this needs to be schedule.  WENDY S BLAIR PA-C 09/12/2018 10:45 AM

## 2018-09-13 ENCOUNTER — Encounter (HOSPITAL_COMMUNITY): Payer: Self-pay | Admitting: Internal Medicine

## 2018-09-17 ENCOUNTER — Telehealth: Payer: Self-pay | Admitting: Student

## 2018-09-17 NOTE — Telephone Encounter (Signed)
Received refill request for Plavix 75 mg taken once daily.  Chart reviewed. Patient to continue Plavix as above.   Refill request filled and faxed by Anderson Malta to [facility] [phone]).  Bea Graff Antavia Tandy, PA-C 09/17/2018, 9:46 AM

## 2018-09-19 NOTE — Telephone Encounter (Signed)
Patients wife called 09/19/2018 asking for a refill on propranolol.  Electronic refill request was sent to Dr. Naaman Plummer initially on 09/12/2018

## 2018-10-12 ENCOUNTER — Other Ambulatory Visit: Payer: Self-pay | Admitting: Physical Medicine & Rehabilitation

## 2018-11-14 ENCOUNTER — Other Ambulatory Visit: Payer: Self-pay | Admitting: Physical Medicine & Rehabilitation

## 2018-12-10 ENCOUNTER — Other Ambulatory Visit: Payer: Self-pay | Admitting: Physical Medicine & Rehabilitation

## 2018-12-10 ENCOUNTER — Encounter: Payer: Self-pay | Admitting: Occupational Therapy

## 2018-12-10 DIAGNOSIS — I69151 Hemiplegia and hemiparesis following nontraumatic intracerebral hemorrhage affecting right dominant side: Secondary | ICD-10-CM

## 2018-12-10 NOTE — Therapy (Signed)
[facility] [address] [place], [place], [address] Phone: [phone]   Fax:  [phone]  Occupational Therapy Treatment  Patient Details  Name: William Lowery MRN: [id number] Date of Birth: Oct 02, 1966 No data recorded  Encounter Date: 12/10/2018    Past Medical History:  Diagnosis Date  . Asthma    hx of  . Heart murmur    as an infant  . Hypertension   . OSA (obstructive sleep apnea)   . Sleep apnea    wears CPAP  . Stroke [facility])     Past Surgical History:  Procedure Laterality Date  . bellybutton procedure     as a newborn  . IR ANGIO INTRA EXTRACRAN SEL COM CAROTID INNOMINATE BILAT MOD SED  12/01/2017  . IR ANGIO VERTEBRAL SEL VERTEBRAL UNI R MOD SED  12/01/2017  . IR CT HEAD LTD  12/01/2017  . IR INTRA CRAN STENT  12/01/2017  . IR PERCUTANEOUS ART THROMBECTOMY/INFUSION INTRACRANIAL INC DIAG ANGIO  12/01/2017  . IR RADIOLOGIST EVAL & MGMT  02/12/2018  . IR RADIOLOGIST EVAL & MGMT  04/11/2018  . RADIOLOGY WITH ANESTHESIA N/A 12/01/2017   Procedure: RADIOLOGY WITH ANESTHESIA CODE STROKE;  Surgeon: Radiologist, Medication, MD;  Location: [facility];  Service: Radiology;  Laterality: N/A;  . TEE WITHOUT CARDIOVERSION N/A 12/06/2017   Procedure: TRANSESOPHAGEAL ECHOCARDIOGRAM (TEE);  Surgeon: Pixie Casino, MD;  Location: [facility] ENDOSCOPY;  Service: Cardiovascular;  Laterality: N/A;    There were no vitals filed for this visit.                          OT Short Term Goals - 02/19/18 1521      OT SHORT TERM GOAL #1   Title  Pt and wife will be mod I with HEP for coordination, RUE strength and cognition - due 01/22/2018    Status  Achieved      OT SHORT TERM GOAL #2   Title  Pt will demonstrate improved gross control of RUE as evidenced by increasing by at least 5 blocks on Box and Blocks to assist with basic ADL's    Status  Achieved   01/22/2018  23 blocks     OT SHORT TERM GOAL #3   Title  Pt  will be mod I with bathing at shower level using AE for back prn    Status  Partially Met   wife still wishes to provide distant supevision     OT SHORT TERM GOAL #4   Title  Pt will be mod I with dressing    Status  Achieved      OT SHORT TERM GOAL #5   Title  Pt will be mod I with toilet hygiene    Status  Achieved      OT SHORT TERM GOAL #6   Title  Pt will demonstrate inmproved grip strength by at least 5 pounds to assist with basic ADL and IADL tasks (baseline =20 pounds)    Status  Achieved   01/22/2018  32 pounds     OT SHORT TERM GOAL #7   Title  Pt will use RUE as stabilizer during ADL and IADL tasks    Status  Achieved        OT Long Term Goals - 02/19/18 1521      OT LONG TERM GOAL #1   Title  Pt will be mod I with upgraded HEP for cognition and  RUE funcitonal use - 02/26/2018 (date adjusted as pt missed one week of therapy)    Status  Achieved      OT LONG TERM GOAL #2   Title  Pt will demonstrate improved gross motor control of RUE as evidenced by improving by at least 20 blocks on BOx and blocks to assist with IADL's (baseline = 12)    Status  On-going   02/11/2018  24 blocks     OT LONG TERM GOAL #3   Title  Pt will demonstrate improved grip strength by at least  20 pounds to assist with IADL tasks ( baseline= 20 pounds)    Status  Achieved   02/11/2018  56 pounds     OT LONG TERM GOAL #4   Title  Pt will demonstrate ability for basic problem solving and organization with familiar functional task.    Status  Achieved      OT LONG TERM GOAL #5   Title  Pt will demonstrate ability to write name with 100% legibility    Status  On-going      OT LONG TERM GOAL #6   Title  Pt will demonstrate ability for environmental scanning during physical task with 90% accuracy.    Status  Achieved      OT LONG TERM GOAL #7   Title  Pt will demonstrate ability to complete functional task on outdoor uneven surfaces involving basic problem solving and organization.      Status  Achieved              Patient will benefit from skilled therapeutic intervention in order to improve the following deficits and impairments:     Visit Diagnosis: Hemiplegia and hemiparesis following nontraumatic intracerebral hemorrhage affecting right dominant side [facility])    Problem List Patient Active Problem List   Diagnosis Date Noted  . Moderate aortic stenosis 01/08/2018  . Ascending aorta dilatation (HCC) 01/08/2018  . Mixed hyperlipidemia 01/08/2018  . History of recent stroke 01/08/2018  . Hypoalbuminemia due to protein-calorie malnutrition ([place])   . Essential hypertension   . Cerebrovascular accident (CVA) due to occlusion of left posterior communicating artery ([place]) 12/10/2017  . Cerebrovascular accident (CVA) due to thrombosis of left posterior cerebral artery ([place])   . Benign essential HTN   . Morbid obesity ([place])   . OSA (obstructive sleep apnea)   . Enteritis due to Clostridium difficile   . Acute CVA (cerebrovascular accident) ([place]) 12/01/2017  . Acute arterial ischemic stroke, multifocal, posterior circulation ([place]) 12/01/2017   OCCUPATIONAL THERAPY DISCHARGE SUMMARY  Visits from Start of Care: 11  Current functional level related to goals / functional outcomes: See above unable to assess remaining goals as pt did not return to therapy   Remaining deficits: See PN's   Education / Equipment: See PN's Plan: Patient agrees to discharge.  Patient goals were partially met. Patient is being discharged due to not returning since the last visit.  ?????       Quay Burow, OTR/L 12/10/2018, 4:23 PM  [facility] [address] [place], [place], [address] Phone: [phone]   Fax:  [phone]  Name: William Lowery MRN: [id number] Date of Birth: [birth year]

## 2019-01-05 ENCOUNTER — Other Ambulatory Visit: Payer: Self-pay | Admitting: Physical Medicine & Rehabilitation

## 2019-01-16 ENCOUNTER — Telehealth: Payer: Self-pay

## 2019-01-16 MED ORDER — PROPRANOLOL HCL 20 MG PO TABS: 20.0000 mg | ORAL_TABLET | Freq: Three times a day (TID) | ORAL | 0 refills | Status: DC

## 2019-01-16 NOTE — Telephone Encounter (Signed)
Pharmacy faxed request. One refill sent. Patient needs to make appt.

## 2019-02-13 ENCOUNTER — Other Ambulatory Visit: Payer: Self-pay | Admitting: Physical Medicine & Rehabilitation

## 2019-02-19 ENCOUNTER — Other Ambulatory Visit: Payer: Self-pay | Admitting: Physical Medicine & Rehabilitation

## 2019-02-21 ENCOUNTER — Telehealth: Payer: Self-pay | Admitting: Physical Medicine & Rehabilitation

## 2019-02-21 MED ORDER — PROPRANOLOL HCL 20 MG PO TABS: 20.0000 mg | ORAL_TABLET | Freq: Three times a day (TID) | ORAL | 0 refills | Status: DC

## 2019-02-21 NOTE — Telephone Encounter (Signed)
Patient is out of Propranolol HCl 20 MG. Can we send a refill? He is scheduled for 03/11/19. Dr. Naaman Plummer does not have anything sooner.

## 2019-03-11 ENCOUNTER — Encounter: Payer: Self-pay | Attending: Physical Medicine & Rehabilitation | Admitting: Physical Medicine & Rehabilitation

## 2019-03-11 ENCOUNTER — Other Ambulatory Visit: Payer: Self-pay

## 2019-03-11 ENCOUNTER — Encounter: Payer: Self-pay | Admitting: Physical Medicine & Rehabilitation

## 2019-03-11 VITALS — Temp 98.3°F | Ht 70.0 in | Wt 326.0 lb

## 2019-03-11 DIAGNOSIS — L03115 Cellulitis of right lower limb: Secondary | ICD-10-CM | POA: Insufficient documentation

## 2019-03-11 DIAGNOSIS — I63332 Cerebral infarction due to thrombosis of left posterior cerebral artery: Secondary | ICD-10-CM | POA: Insufficient documentation

## 2019-03-11 DIAGNOSIS — I10 Essential (primary) hypertension: Secondary | ICD-10-CM | POA: Insufficient documentation

## 2019-03-11 NOTE — Progress Notes (Signed)
Subjective:    Patient ID: William Lowery, male    DOB: [birth year], 53 y.o.   MRN: [id number]  HPI   Due to national recommendations of social distancing because of COVID 45, an audio/video tele-health visit is felt to be the most appropriate encounter for this patient at this time. See MyChart message from today for the patient's consent to a tele-health encounter with [facility]. This is a follow up telephone visit for the patient who is at home. MD is at office.    I am meeting with the patient today regarding his left PCA infarction. He states that he's been doing fairly well at home. He continues to work on his coordination and balance. His right hand is his biggest challenge. He feels that it's not getting any better. Work can be challenging with his writing. Emotionally it's a challenge too at times, but he seems to be doing better from that standpoint  Otherwise his health has been good. He has put on some weight but that's been diet and activity related.     Pain Inventory Average Pain 0 Pain Right Now 0 My pain is na  In the last 24 hours, has pain interfered with the following? General activity 0 Relation with others 0 Enjoyment of life 0 What TIME of day is your pain at its worst? na Sleep (in general) na  Pain is worse with: na Pain improves with: na Relief from Meds: na  Mobility walk without assistance ability to climb steps?  yes do you drive?  yes  Function employed # of hrs/week 40  Neuro/Psych weakness numbness tremor tingling  Prior Studies Any changes since last visit?  no  Physicians involved in your care Any changes since last visit?  no   Family History  Problem Relation Age of Onset  . COPD Mother   . Stroke Paternal Uncle   . Colon cancer Neg Hx   . Esophageal cancer Neg Hx   . Rectal cancer Neg Hx   . Stomach cancer Neg Hx    Social History   Socioeconomic History  . Marital status: Married     Spouse name: Not on file  . Number of children: Not on file  . Years of education: Not on file  . Highest education level: Not on file  Occupational History  . Not on file  Social Needs  . Financial resource strain: Not on file  . Food insecurity:    Worry: Not on file    Inability: Not on file  . Transportation needs:    Medical: Not on file    Non-medical: Not on file  Tobacco Use  . Smoking status: Never Smoker  . Smokeless tobacco: Former Systems developer    Types: Snuff  Substance and Sexual Activity  . Alcohol use: Yes    Alcohol/week: 0.0 standard drinks    Comment: socially  . Drug use: No  . Sexual activity: Not on file  Lifestyle  . Physical activity:    Days per week: Not on file    Minutes per session: Not on file  . Stress: Not on file  Relationships  . Social connections:    Talks on phone: Not on file    Gets together: Not on file    Attends religious service: Not on file    Active member of club or organization: Not on file    Attends meetings of clubs or organizations: Not on file    Relationship  status: Not on file  Other Topics Concern  . Not on file  Social History Narrative  . Not on file   Past Surgical History:  Procedure Laterality Date  . bellybutton procedure     as a newborn  . IR ANGIO INTRA EXTRACRAN SEL COM CAROTID INNOMINATE BILAT MOD SED  12/01/2017  . IR ANGIO VERTEBRAL SEL VERTEBRAL UNI R MOD SED  12/01/2017  . IR CT HEAD LTD  12/01/2017  . IR INTRA CRAN STENT  12/01/2017  . IR PERCUTANEOUS ART THROMBECTOMY/INFUSION INTRACRANIAL INC DIAG ANGIO  12/01/2017  . IR RADIOLOGIST EVAL & MGMT  02/12/2018  . IR RADIOLOGIST EVAL & MGMT  04/11/2018  . RADIOLOGY WITH ANESTHESIA N/A 12/01/2017   Procedure: RADIOLOGY WITH ANESTHESIA CODE STROKE;  Surgeon: Radiologist, Medication, MD;  Location: [facility];  Service: Radiology;  Laterality: N/A;  . TEE WITHOUT CARDIOVERSION N/A 12/06/2017   Procedure: TRANSESOPHAGEAL ECHOCARDIOGRAM (TEE);  Surgeon: Pixie Casino,  MD;  Location: [facility] ENDOSCOPY;  Service: Cardiovascular;  Laterality: N/A;   Past Medical History:  Diagnosis Date  . Asthma    hx of  . Heart murmur    as an infant  . Hypertension   . OSA (obstructive sleep apnea)   . Sleep apnea    wears CPAP  . Stroke [facility])    There were no vitals taken for this visit.  Opioid Risk Score:   Fall Risk Score:  `1  Depression screen PHQ 2/9  Depression screen [facility] 2/9 03/25/2018 09/07/2016 11/05/2015  Decreased Interest 0 0 0  Down, Depressed, Hopeless 0 0 0  PHQ - 2 Score 0 0 0     Review of Systems  Constitutional: Negative.   HENT: Negative.   Eyes: Negative.   Respiratory: Negative.   Cardiovascular: Negative.   Gastrointestinal: Negative.   Endocrine: Negative.   Genitourinary: Negative.   Musculoskeletal: Negative.   Skin: Negative.   Allergic/Immunologic: Negative.   Neurological: Positive for tremors, weakness and numbness.  Hematological: Negative.   Psychiatric/Behavioral: Negative.   All other systems reviewed and are negative.        Assessment & Plan:  1. Gait disorder with visual disturbance secondary to left PCA infarction.PCA occlusion status post thrombectomy and placement of loop recorder           -continue with HEP  -maintain working as he's able. Working 30-40 hrs week.   2.Right-sidedataxia/tremor. weighted utencils, and repetition.    -he may not experience a lot of physical recovery at this point, but I encouraged him to keep trying different tools and strategies.  3. Mood:For the most part he has demonstrated ongoing improvement.            -appears to have reasonable expectations moving forward. 7.Hx of c diff, completed vancomycin.  -continue probiotic 8. History of hypertension. Cozaar 50 mg daily -per primary 9. OSA.continueCPAP at home   7 minutes of tele time. Follow up prn

## 2019-03-18 ENCOUNTER — Other Ambulatory Visit: Payer: Self-pay | Admitting: Physical Medicine & Rehabilitation

## 2019-03-27 ENCOUNTER — Telehealth: Payer: Self-pay

## 2019-03-27 MED ORDER — PROPRANOLOL HCL 20 MG PO TABS: 20.0000 mg | ORAL_TABLET | Freq: Three times a day (TID) | ORAL | 0 refills | Status: AC

## 2019-03-27 NOTE — Telephone Encounter (Signed)
Refill of medication

## 2019-04-04 ENCOUNTER — Encounter (HOSPITAL_BASED_OUTPATIENT_CLINIC_OR_DEPARTMENT_OTHER): Payer: Self-pay | Admitting: Registered Nurse

## 2019-04-04 ENCOUNTER — Encounter: Payer: Self-pay | Admitting: Registered Nurse

## 2019-04-04 ENCOUNTER — Telehealth: Payer: Self-pay | Admitting: *Deleted

## 2019-04-04 ENCOUNTER — Other Ambulatory Visit: Payer: Self-pay

## 2019-04-04 VITALS — BP 148/98 | HR 83 | Temp 99.9°F | Ht 70.0 in | Wt 318.0 lb

## 2019-04-04 DIAGNOSIS — I63332 Cerebral infarction due to thrombosis of left posterior cerebral artery: Secondary | ICD-10-CM

## 2019-04-04 DIAGNOSIS — L03115 Cellulitis of right lower limb: Secondary | ICD-10-CM

## 2019-04-04 NOTE — Telephone Encounter (Signed)
Patient seen in office today. Wife was persistent.  Patients calf muscle is swollen like a melon with red and yellow coloration. Zella Ball can give an update once she is done with the visit
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
## 2019-04-04 NOTE — Telephone Encounter (Signed)
If you can put him on eunice's schedule on Tuesday when I'm in the office, I can pop in.

## 2019-04-05 ENCOUNTER — Emergency Department (HOSPITAL_COMMUNITY): Payer: PRIVATE HEALTH INSURANCE

## 2019-04-05 ENCOUNTER — Other Ambulatory Visit: Payer: Self-pay

## 2019-04-05 ENCOUNTER — Emergency Department (HOSPITAL_BASED_OUTPATIENT_CLINIC_OR_DEPARTMENT_OTHER): Payer: PRIVATE HEALTH INSURANCE

## 2019-04-05 ENCOUNTER — Emergency Department (HOSPITAL_COMMUNITY)
Admission: EM | Admit: 2019-04-05 | Discharge: 2019-04-05 | Disposition: A | Discharge status: Home / Self Care | Payer: PRIVATE HEALTH INSURANCE | Attending: Emergency Medicine | Admitting: Emergency Medicine

## 2019-04-05 DIAGNOSIS — L0390 Cellulitis, unspecified: Secondary | ICD-10-CM | POA: Diagnosis not present

## 2019-04-05 DIAGNOSIS — R609 Edema, unspecified: Secondary | ICD-10-CM

## 2019-04-05 DIAGNOSIS — Z8673 Personal history of transient ischemic attack (TIA), and cerebral infarction without residual deficits: Secondary | ICD-10-CM | POA: Diagnosis not present

## 2019-04-05 DIAGNOSIS — I10 Essential (primary) hypertension: Secondary | ICD-10-CM | POA: Diagnosis not present

## 2019-04-05 DIAGNOSIS — J45909 Unspecified asthma, uncomplicated: Secondary | ICD-10-CM | POA: Insufficient documentation

## 2019-04-05 DIAGNOSIS — L03115 Cellulitis of right lower limb: Secondary | ICD-10-CM | POA: Diagnosis not present

## 2019-04-05 DIAGNOSIS — Z87891 Personal history of nicotine dependence: Secondary | ICD-10-CM | POA: Diagnosis not present

## 2019-04-05 DIAGNOSIS — L538 Other specified erythematous conditions: Secondary | ICD-10-CM | POA: Diagnosis not present

## 2019-04-05 DIAGNOSIS — Z79899 Other long term (current) drug therapy: Secondary | ICD-10-CM | POA: Insufficient documentation

## 2019-04-05 DIAGNOSIS — M79661 Pain in right lower leg: Secondary | ICD-10-CM | POA: Insufficient documentation

## 2019-04-05 DIAGNOSIS — R2241 Localized swelling, mass and lump, right lower limb: Secondary | ICD-10-CM | POA: Diagnosis present

## 2019-04-05 DIAGNOSIS — Z7982 Long term (current) use of aspirin: Secondary | ICD-10-CM | POA: Diagnosis not present

## 2019-04-05 MED ORDER — CEPHALEXIN 500 MG PO CAPS: 500.0000 mg | ORAL_CAPSULE | Freq: Four times a day (QID) | ORAL | 0 refills | Status: DC

## 2019-04-05 MED ORDER — HYDROCODONE-ACETAMINOPHEN 5-325 MG PO TABS: 1.0000 | ORAL_TABLET | Freq: Four times a day (QID) | ORAL | 0 refills | Status: DC | PRN

## 2019-04-05 NOTE — ED Notes (Signed)
Pt back from X-ray.  

## 2019-04-05 NOTE — Progress Notes (Signed)
VASCULAR LAB PRELIMINARY  PRELIMINARY  PRELIMINARY  PRELIMINARY  Right lower extremity venous duplex completed.    Preliminary report:  See CV proc for preliminary results.   Gave Dr. Alvino Chapel report  Mauro Kaufmann, Claris Pech, RVT 04/05/2019, 1:13 PM

## 2019-04-05 NOTE — ED Provider Notes (Signed)
[place] EMERGENCY DEPARTMENT Provider Note   CSN: [id number] Arrival date & time: 04/05/19  1124    History   Chief Complaint Chief Complaint  Patient presents with   Leg Pain   Leg Swelling    HPI William Lowery is a 53 y.o. male.     HPI Patient presents with right calf pain and swelling.  A week and a half ago he fell off a porch and landed on his right side.  Did hit his head and arm and leg.  States there is more pain and swelling in the leg but has since become more swollen and more red.  Saw PCP and was worried about cellulitis.  No chest pain.  No trouble breathing.  No confusion.  Had previous stroke and is on Plavix.  No confusion.  No fevers or chills.  States he does bruise easily. Past Medical History:  Diagnosis Date   Asthma    hx of   Heart murmur    as an infant   Hypertension    OSA (obstructive sleep apnea)    Sleep apnea    wears CPAP   Stroke [facility])     Patient Active Problem List   Diagnosis Date Noted   Moderate aortic stenosis 01/08/2018   Ascending aorta dilatation ([place]) 01/08/2018   Mixed hyperlipidemia 01/08/2018   History of recent stroke 01/08/2018   Hypoalbuminemia due to protein-calorie malnutrition [facility])    Essential hypertension    Cerebrovascular accident (CVA) due to occlusion of left posterior communicating artery ([place]) 12/10/2017   Cerebrovascular accident (CVA) due to thrombosis of left posterior cerebral artery (HCC)    Benign essential HTN    Morbid obesity (HCC)    OSA (obstructive sleep apnea)    Enteritis due to Clostridium difficile    Acute CVA (cerebrovascular accident) ([place]) 12/01/2017   Acute arterial ischemic stroke, multifocal, posterior circulation ([place]) 12/01/2017    Past Surgical History:  Procedure Laterality Date   bellybutton procedure     as a newborn   IR ANGIO INTRA EXTRACRAN SEL COM CAROTID INNOMINATE BILAT MOD SED  12/01/2017   IR ANGIO VERTEBRAL SEL  VERTEBRAL UNI R MOD SED  12/01/2017   IR CT HEAD LTD  12/01/2017   IR INTRA CRAN STENT  12/01/2017   IR PERCUTANEOUS ART THROMBECTOMY/INFUSION INTRACRANIAL INC DIAG ANGIO  12/01/2017   IR RADIOLOGIST EVAL & MGMT  02/12/2018   IR RADIOLOGIST EVAL & MGMT  04/11/2018   RADIOLOGY WITH ANESTHESIA N/A 12/01/2017   Procedure: RADIOLOGY WITH ANESTHESIA CODE STROKE;  Surgeon: Radiologist, Medication, MD;  Location: [place];  Service: Radiology;  Laterality: N/A;   TEE WITHOUT CARDIOVERSION N/A 12/06/2017   Procedure: TRANSESOPHAGEAL ECHOCARDIOGRAM (TEE);  Surgeon: Pixie Casino, MD;  Location: [facility] ENDOSCOPY;  Service: Cardiovascular;  Laterality: N/A;        Home Medications    Prior to Admission medications   Medication Sig Start Date End Date Taking? Authorizing Provider  aspirin 325 MG tablet Take 325 mg by mouth daily.   Yes [provider]  atorvastatin (LIPITOR) 10 MG tablet Take 1 tablet (10 mg total) by mouth daily at 6 PM. 12/19/17  Yes Angiulli, Lavon Paganini, PA-C  B Complex Vitamins (B-COMPLEX/B-12) TABS Take 1 tablet by mouth daily.   Yes [provider]  Cholecalciferol (VITAMIN D PO) Take by mouth daily.   Yes [provider]  clopidogrel (PLAVIX) 75 MG tablet Take 1 tablet (75 mg total) by mouth  daily. 06/03/18  Yes Meredith Staggers, MD  Multiple Vitamins-Minerals (CENTRUM MEN) TABS Take by mouth.   Yes [provider]  propranolol (INDERAL) 20 MG tablet Take 1 tablet (20 mg total) by mouth 3 (three) times daily. 03/27/19  Yes Meredith Staggers, MD  cephALEXin (KEFLEX) 500 MG capsule Take 1 capsule (500 mg total) by mouth 4 (four) times daily. 04/05/19   Davonna Belling, MD  HYDROcodone-acetaminophen (NORCO/VICODIN) 5-325 MG tablet Take 1-2 tablets by mouth every 6 (six) hours as needed. 04/05/19   Davonna Belling, MD  ticagrelor (BRILINTA) 90 MG TABS tablet Take 1 tablet (90 mg total) by mouth 2 (two) times daily. Patient not taking: Reported on  04/04/2019 12/19/17   Angiulli, Lavon Paganini, PA-C    Family History Family History  Problem Relation Age of Onset   COPD Mother    Stroke Paternal Uncle    Colon cancer Neg Hx    Esophageal cancer Neg Hx    Rectal cancer Neg Hx    Stomach cancer Neg Hx     Social History Social History   Tobacco Use   Smoking status: Never Smoker   Smokeless tobacco: Former Systems developer    Types: Snuff  Substance Use Topics   Alcohol use: Yes    Alcohol/week: 0.0 standard drinks    Comment: socially   Drug use: No     Allergies   Patient has no known allergies.   Review of Systems Review of Systems  Constitutional: Negative for appetite change.  Respiratory: Negative for shortness of breath.   Cardiovascular: Positive for leg swelling. Negative for chest pain.  Gastrointestinal: Negative for abdominal pain.  Musculoskeletal: Negative for gait problem and neck pain.  Skin: Positive for color change and wound.  Neurological: Negative for weakness.  Hematological: Bruises/bleeds easily.     Physical Exam Updated Vital Signs BP (!) 131/109    Pulse 62    Temp 99 F (37.2 C) (Oral)    Resp 17    SpO2 98%   Physical Exam Vitals signs and nursing note reviewed.  Eyes:     Extraocular Movements: Extraocular movements intact.  Neck:     Musculoskeletal: Neck supple.  Abdominal:     Tenderness: There is no abdominal tenderness.  Musculoskeletal:     Right lower leg: Edema present.     Comments: Ecchymosis and swelling of right lower leg.  Also somewhat on medial right ankle.  Some tenderness over inferior lower leg.  Has some induration and skin changes over lower half the leg and more healing ecchymosis over upper half of lower leg.  Good flexion and extension at the ankle.  Sensation intact distally.  Skin:    Capillary Refill: Capillary refill takes less than 2 seconds.  Neurological:     Mental Status: He is alert. Mental status is at baseline.      ED Treatments / Results   Labs (all labs ordered are listed, but only abnormal results are displayed) Labs Reviewed - No data to display  EKG None  Radiology Dg Tibia/fibula Right  Result Date: 04/05/2019 CLINICAL DATA:  Right lower leg pain, redness and swelling since a fall from a porch 03/26/2019. Initial encounter. EXAM: RIGHT TIBIA AND FIBULA - 2 VIEW COMPARISON:  None. FINDINGS: There is no acute bony or joint abnormality. No soft tissue gas or foreign body. There is infiltration of subcutaneous fat the lower leg. IMPRESSION: Infiltration of subcutaneous fat could be due to cellulitis or dependent change. The  exam is otherwise negative. Electronically Signed   By: Inge Rise M.D.   On: 04/05/2019 12:27   Vas [place] Lower Extremity Venous (dvt) (only [facility])  Result Date: 04/05/2019  Lower Venous Study Indications: Edema, Erythema, and cellulitis.  Limitations: Body habitus and significant edema. Comparison Study: Prior negative study from 12/04/17 Performing Technologist: Sharion Dove RVS  Examination Guidelines: A complete evaluation includes B-mode imaging, spectral Doppler, color Doppler, and power Doppler as needed of all accessible portions of each vessel. Bilateral testing is considered an integral part of a complete examination. Limited examinations for reoccurring indications may be performed as noted.  +---------+---------------+---------+-----------+----------+-------------------+  RIGHT     Compressibility Phasicity Spontaneity Properties Summary              +---------+---------------+---------+-----------+----------+-------------------+  CFV       Full            Yes       Yes                                         +---------+---------------+---------+-----------+----------+-------------------+  SFJ       Full                                                                  +---------+---------------+---------+-----------+----------+-------------------+  FV Prox   Full                                                                   +---------+---------------+---------+-----------+----------+-------------------+  FV Mid    Full                                                                  +---------+---------------+---------+-----------+----------+-------------------+  FV Distal Full                                                                  +---------+---------------+---------+-----------+----------+-------------------+  PFV       Full                                                                  +---------+---------------+---------+-----------+----------+-------------------+  POP       Full            Yes       Yes                                         +---------+---------------+---------+-----------+----------+-------------------+  PTV       Full                                                                  +---------+---------------+---------+-----------+----------+-------------------+  PERO                                                       Not all segments                                                                 visualized           +---------+---------------+---------+-----------+----------+-------------------+   +----+---------------+---------+-----------+----------+-------+  LEFT Compressibility Phasicity Spontaneity Properties Summary  +----+---------------+---------+-----------+----------+-------+  CFV  Full            Yes       Yes                             +----+---------------+---------+-----------+----------+-------+     Summary: Right: There is no evidence of deep vein thrombosis in the lower extremity. However, portions of this examination were limited- see technologist comments above. Left: No evidence of common femoral vein obstruction.  *See table(s) above for measurements and observations.    Preliminary     Procedures Procedures (including critical care time)  Medications Ordered in ED Medications - No data to display   Initial Impression / Assessment  and Plan / ED Course  I have reviewed the triage vital signs and the nursing notes.  Pertinent labs & imaging results that were available during my care of the patient were reviewed by me and considered in my medical decision making (see chart for details).        Patient with pain and swelling in right lower leg.  Erythema.  Doppler although somewhat limited was negative.  X-ray reassuring.  I think likely combination of hematoma from fall and potentially a cellulitis on top of this.  Doubt abscess at this time.  Will give antibiotics and some pain medicine.  Follow-up as an outpatient.  Final Clinical Impressions(s) / ED Diagnoses   Final diagnoses:  Cellulitis of right lower extremity    ED Discharge Orders         Ordered    cephALEXin (KEFLEX) 500 MG capsule  4 times daily     04/05/19 1352    HYDROcodone-acetaminophen (NORCO/VICODIN) 5-325 MG tablet  Every 6 hours PRN     04/05/19 1352           Davonna Belling, MD 04/05/19 1402

## 2019-04-05 NOTE — ED Triage Notes (Signed)
Patient complaining of leg redness, pain and swelling since last Wednesday after a fall off of a porch on his back.

## 2019-04-05 NOTE — ED Notes (Signed)
Patient verbalizes understanding of discharge instructions. Opportunity for questioning and answers were provided. Armband removed by staff, pt discharged from ED.  

## 2019-04-05 NOTE — ED Notes (Signed)
Patient transported to X-ray 

## 2019-04-18 ENCOUNTER — Other Ambulatory Visit: Payer: Self-pay | Admitting: Physical Medicine & Rehabilitation

## 2019-05-20 ENCOUNTER — Telehealth: Payer: Self-pay | Admitting: Student

## 2019-05-20 NOTE — Telephone Encounter (Signed)
Received electronic request from pharmacy for Plavix refill. Patient is currently taking Plavix 75 mg once daily.  Faxed request to [facility] pharmacy in [place], [place] [phone]) at [phone] to fill prescription- Plavix 75 mg tablets, take one tablet by mouth once daily, dispense 90 tablets with 1 refill.   Bea Graff Jourdin Connors, PA-C 05/20/2019, 10:36 AM

## 2019-08-20 ENCOUNTER — Telehealth: Payer: Self-pay | Admitting: Student

## 2019-08-20 NOTE — Telephone Encounter (Signed)
NIR.  Received fax prescription refill request for Plavix. Faxed filled prescription request to [facility] in [place], [place] [phone]) at [phone] to fill prescription- Plavix 75 mg tablets, take 1/2 tablet by mouth once daily, dispense 90 tablets with 1 refill.   Bea Graff Attila Mccarthy, PA-C 08/20/2019, 11:57 AM

## 2020-01-01 IMAGING — CT CT HEAD CODE STROKE
3 series · 14 of 47 positions shown, 16 images · non-contrast
Comparison: None.

CLINICAL DATA: Code stroke. Right-sided weakness beginning [DATE]
minutes ago at the patient's desk.

EXAM:
CT HEAD WITHOUT CONTRAST
TECHNIQUE: Contiguous axial images were obtained from the base of the skull
through the vertex without intravenous contrast.

[Series 3: head 5.0 st · axial · 0.49mm/px · z∈[-145,+15]mm · 8 of 38 slices shown, 10 images]
[im 3/38  brain]
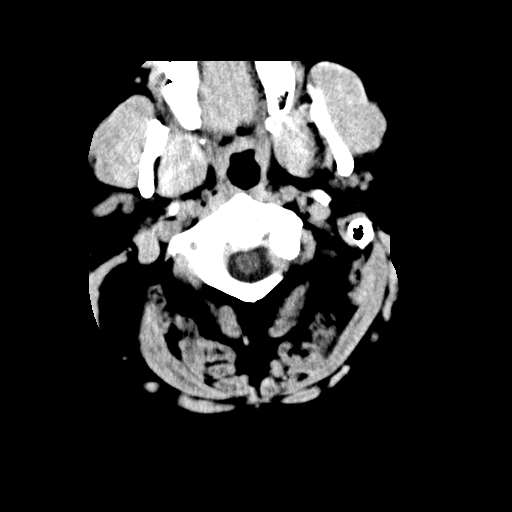
[im 3/38  bone]
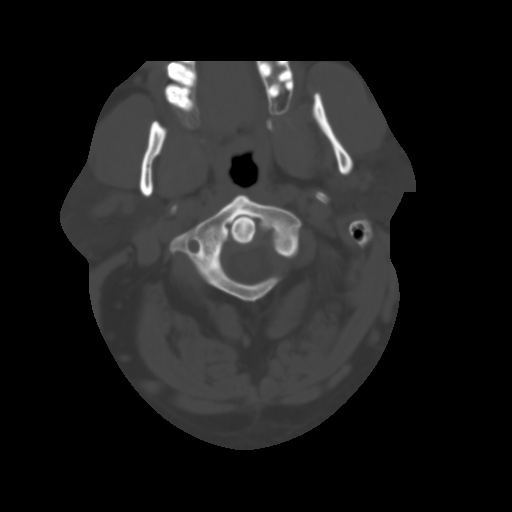
[im 8/38  brain]
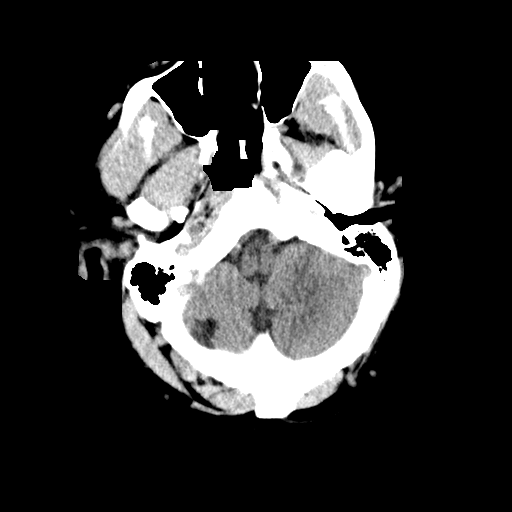
[im 12/38  brain]
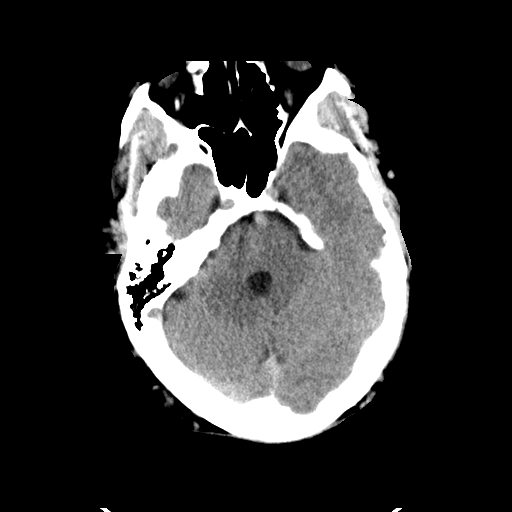
[im 17/38  brain]
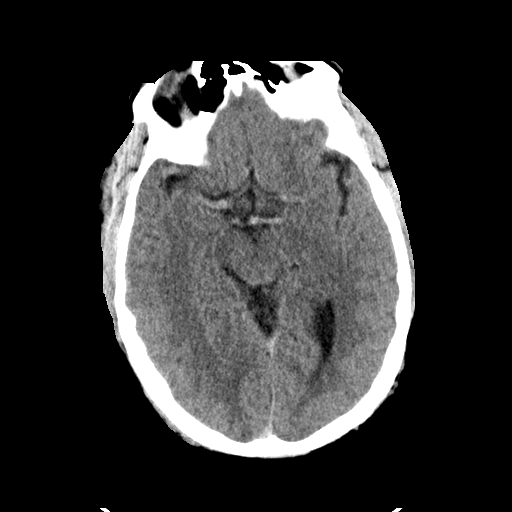
[im 21/38  brain]
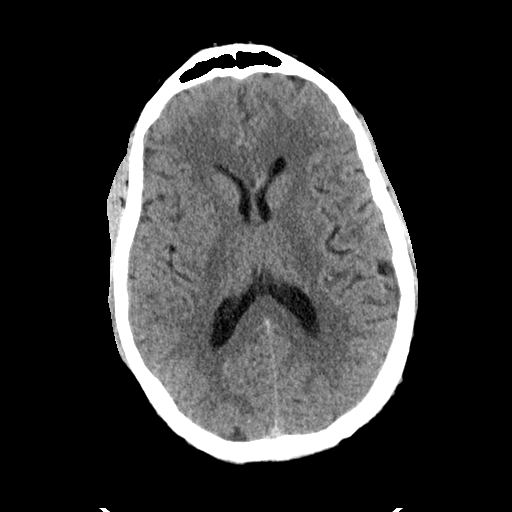
[im 21/38  bone]
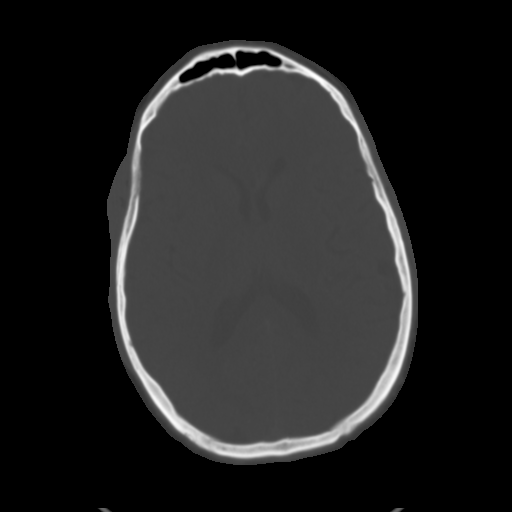
[im 26/38  brain]
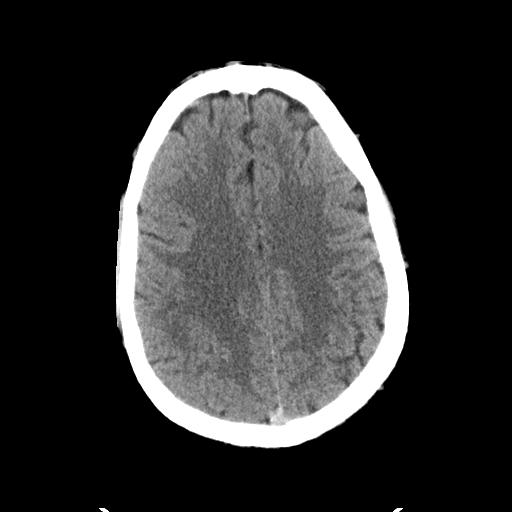
[im 30/38  brain]
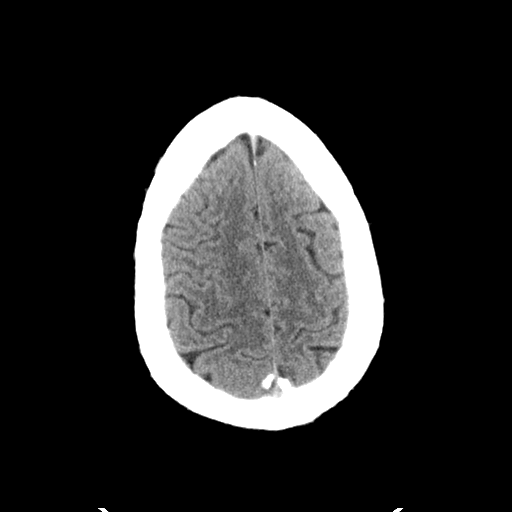
[im 35/38  brain]
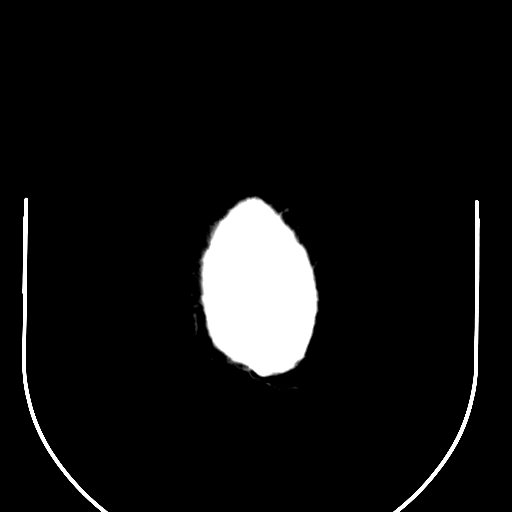

[Series 5: head 3.0 cor st · coronal · 0.37mm/px · 3 of 75 slices shown]
[im 25/75  brain]
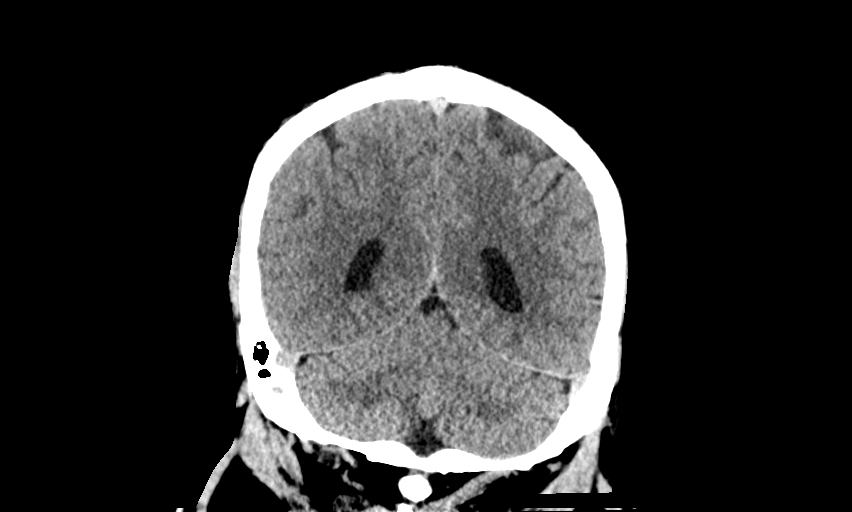
[im 33/75  brain]
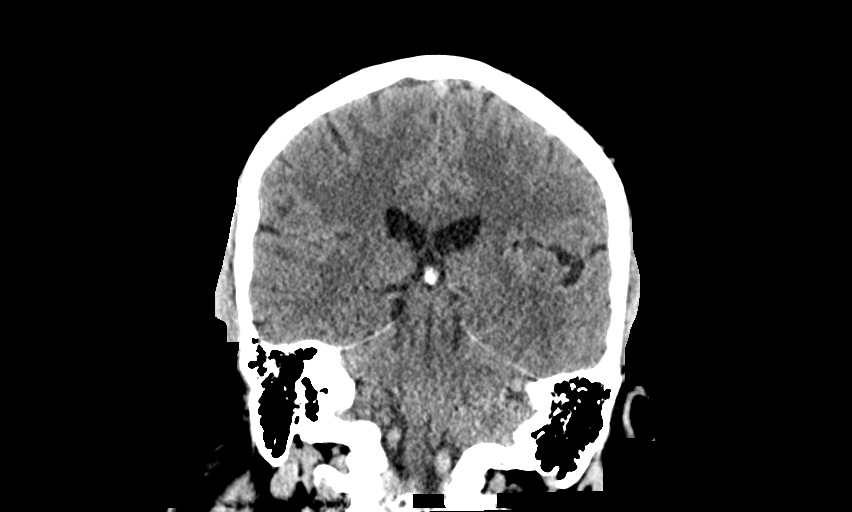
[im 42/75  brain]
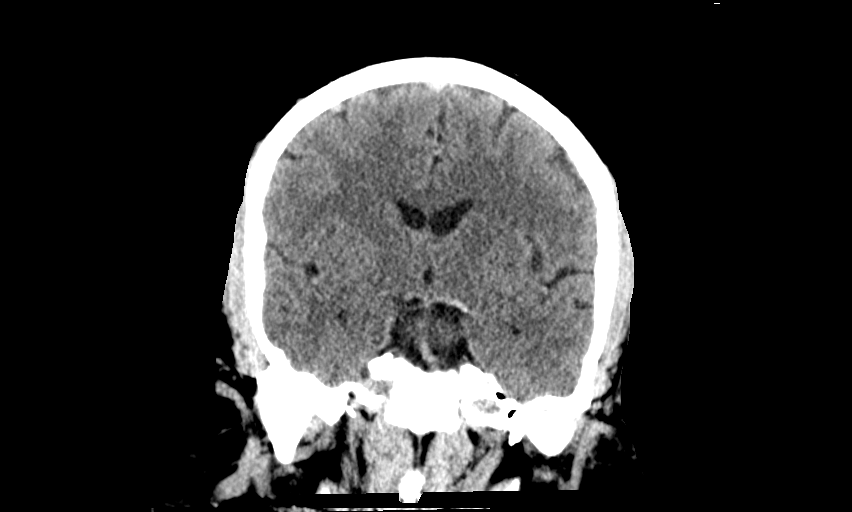

[Series 6: head 3.0 sag st · sagittal · 0.41mm/px · 3 of 67 slices shown]
[im 23/67  brain]
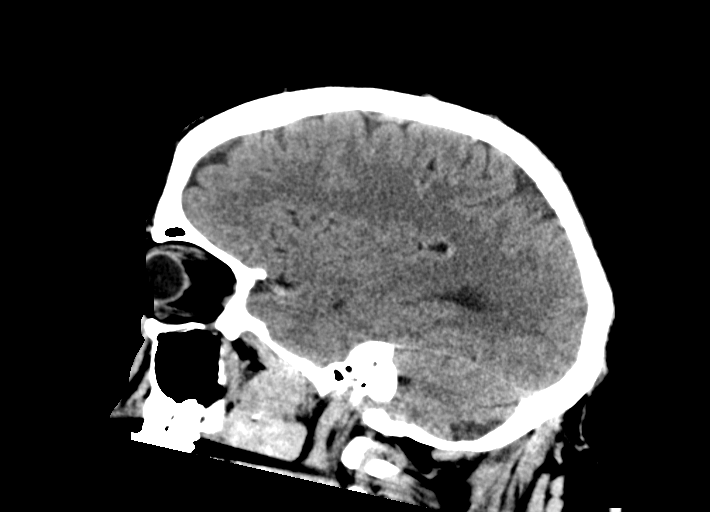
[im 34/67  brain]
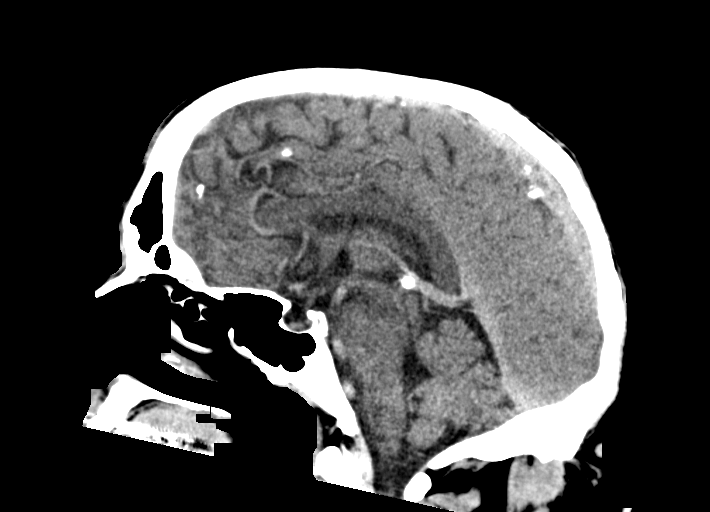
[im 45/67  brain]
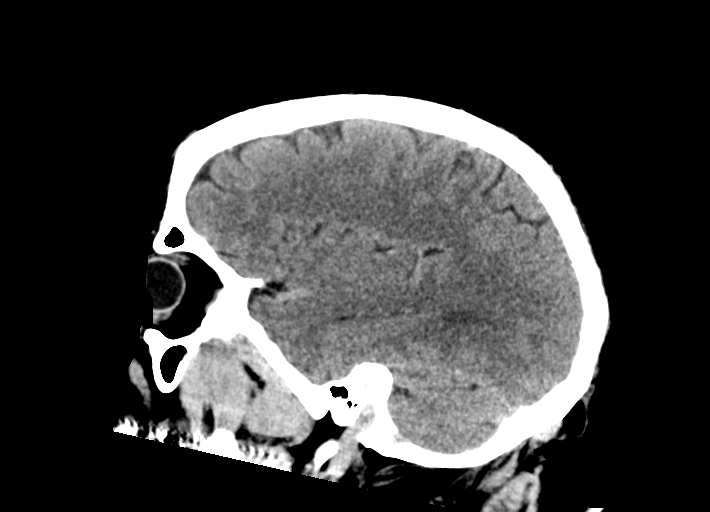

[14 of 47 positions shown; findings below may reference images not displayed]

FINDINGS: Brain: Small remote right cerebellar infarct inferiorly. No visible
acute infarct. No hemorrhage, masslike finding, or hydrocephalus.

Vascular: Concern for hyperdense left P1 segment, see sagittal
reformats. No hyperdensity of MCAs.

Skull: Negative

Sinuses/Orbits: Negative

Other: Case reviewed in person with Dr. Juud.  CTA pending

ASPECTS ([place]ta Stroke Program Early CT Score) left hemisphere

- Ganglionic level infarction (caudate, lentiform nuclei, internal
capsule, insula, M1-M3 cortex): 7

- Supraganglionic infarction (M4-M6 cortex): 3

Total score (0-10 with 10 being normal): 10
IMPRESSION: 1. Possible hyperdense left P1 segment.
2. Negative for hemorrhage or visible acute infarct.
3. Small remote right inferior cerebellar infarct.

## 2020-02-27 ENCOUNTER — Encounter: Payer: Self-pay | Admitting: Internal Medicine

## 2020-02-27 ENCOUNTER — Other Ambulatory Visit: Payer: Self-pay

## 2020-02-27 ENCOUNTER — Ambulatory Visit (INDEPENDENT_AMBULATORY_CARE_PROVIDER_SITE_OTHER): Payer: PRIVATE HEALTH INSURANCE | Admitting: Internal Medicine

## 2020-02-27 VITALS — BP 142/88 | HR 82 | Temp 97.7°F | Ht 70.0 in | Wt 335.0 lb

## 2020-02-27 DIAGNOSIS — I712 Thoracic aortic aneurysm, without rupture, unspecified: Secondary | ICD-10-CM

## 2020-02-27 DIAGNOSIS — Z01812 Encounter for preprocedural laboratory examination: Secondary | ICD-10-CM

## 2020-02-27 DIAGNOSIS — I10 Essential (primary) hypertension: Secondary | ICD-10-CM | POA: Diagnosis not present

## 2020-02-27 DIAGNOSIS — E782 Mixed hyperlipidemia: Secondary | ICD-10-CM

## 2020-02-27 DIAGNOSIS — I350 Nonrheumatic aortic (valve) stenosis: Secondary | ICD-10-CM

## 2020-02-27 NOTE — Patient Instructions (Signed)
Medication Instructions:  DECREASE aspirin to 81mg  daily Continue other current medications  *If you need a refill on your cardiac medications before your next appointment, please call your pharmacy*   Lab Work: BMET & lipid apnel (fasting) to be completed prior to CT test   If you have labs (blood work) drawn today and your tests are completely normal, you will receive your results only by: Marland Kitchen MyChart Message (if you have MyChart) OR . A paper copy in the mail If you have any lab test that is abnormal or we need to change your treatment, we will call you to review the results.   Testing/Procedures: ECHOCARDIOGRAM and CT ANGIOGRAM @ [address] 3rd Floor   Follow-Up: At [facility], you and your health needs are our priority.  As part of our continuing mission to provide you with exceptional heart care, we have created designated Provider Care Teams.  These Care Teams include your primary Cardiologist (physician) and Advanced Practice Providers (APPs -  Physician Assistants and Nurse Practitioners) who all work together to provide you with the care you need, when you need it.  We recommend signing up for the patient portal called "MyChart".  Sign up information is provided on this After Visit Summary.  MyChart is used to connect with patients for Virtual Visits (Telemedicine).  Patients are able to view lab/test results, encounter notes, upcoming appointments, etc.  Non-urgent messages can be sent to your provider as well.   To learn more about what you can do with MyChart, go to [email or web address].    Your next appointment:   12 month(s)  The format for your next appointment:   In Person  Provider:   You may see Dr. Debara Pickett or one of the following Advanced Practice Providers on your designated Care Team:    Almyra Deforest, PA-C  Fabian Sharp, [place] or   Roby Lofts, [place]    Other Instructions

## 2020-02-27 NOTE — Progress Notes (Signed)
OFFICE NOTE  Chief Complaint:  Follow-up  Primary Care Physician: Deland Pretty, MD  HPI:  William Lowery is a 54 y.o. male with a past medial history significant for attention, obstructive sleep apnea and recent presentation in January 2019 of right face, arm and leg numbness consistent with stroke.  He is a new patient to cardiology.  I had seen him in the hospital, but only to perform her procedure.  On presentation he had noted headache with right eye blurriness that resolved.  Afterwards he developed this significant numbness and presented for evaluation.  He was found to have acute stroke of the left posterior cerebral artery which was felt to be ischemic.  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  I felt the time it is feasible that he could have had some embolization of calcified material from this valve that led to his stroke.  He was seen by infectious disease, however felt that this mass was not likely vegetation.  Blood cultures were drawn however resulted in no growth.  He had no infective symptoms.  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  He does have a history of sleep apnea on CPAP but has been compliant with that for years.  While this may increase his risk of atrial fibrillation, there is no evidence that he has had any A. fib and I did not feel strongly about monitoring for it.  02/27/2020  William Lowery returns today for follow-up.  He still reports some residual fine motor loss in the right hand.  He says his handwriting is not as good as it could be.  He has been on low-dose propranolol to help with some tremor.  He was noted to have moderate aortic stenosis with some calcification of the aortic valve which needed  follow-up.  He is overdue for an echo.  In addition we did a CT angio of the aorta which confirmed a 4.3 cm ascending thoracic aortic aneurysm.  Follow-up was recommended annually but he is overdue for that.  He is also not had any recent labs.  He is seen by Dr. Shelia Media.  He denies any chest pain or worsening shortness of breath.  There has been some weight gain over the past 2 years which is significant up to 50 pounds.  PMHx:  Past Medical History:  Diagnosis Date  . Asthma    hx of  . Heart murmur    as an infant  . Hypertension   . OSA (obstructive sleep apnea)   . Sleep apnea    wears CPAP  . Stroke [facility])     Past Surgical History:  Procedure Laterality Date  . bellybutton procedure     as a newborn  . IR ANGIO INTRA EXTRACRAN SEL COM CAROTID INNOMINATE BILAT MOD SED  12/01/2017  . IR ANGIO VERTEBRAL SEL VERTEBRAL UNI R MOD SED  12/01/2017  . IR CT HEAD LTD  12/01/2017  . IR INTRA CRAN STENT  12/01/2017  . IR PERCUTANEOUS ART THROMBECTOMY/INFUSION INTRACRANIAL INC DIAG ANGIO  12/01/2017  . IR RADIOLOGIST EVAL & MGMT  02/12/2018  . IR RADIOLOGIST EVAL & MGMT  04/11/2018  . RADIOLOGY WITH ANESTHESIA N/A 12/01/2017   Procedure: RADIOLOGY WITH ANESTHESIA CODE STROKE;  Surgeon: Radiologist, Medication,  MD;  Location: [place];  Service: Radiology;  Laterality: N/A;  . TEE WITHOUT CARDIOVERSION N/A 12/06/2017   Procedure: TRANSESOPHAGEAL ECHOCARDIOGRAM (TEE);  Surgeon: Pixie Casino, MD;  Location: [facility] ENDOSCOPY;  Service: Cardiovascular;  Laterality: N/A;    FAMHx:  Family History  Problem Relation Age of Onset  . COPD Mother   . Stroke Paternal Uncle   . Colon cancer Neg Hx   . Esophageal cancer Neg Hx   . Rectal cancer Neg Hx   . Stomach cancer Neg Hx     SOCHx:   reports that he has never smoked. He has quit using smokeless tobacco.  His smokeless tobacco use included snuff. He reports current alcohol use. He reports that he does not use drugs.  ALLERGIES:  No Known  Allergies  ROS: Pertinent items noted in HPI and remainder of comprehensive ROS otherwise negative.  HOME MEDS: Current Outpatient Medications on File Prior to Visit  Medication Sig Dispense Refill  . aspirin 325 MG tablet Take 325 mg by mouth daily.    Marland Kitchen atorvastatin (LIPITOR) 10 MG tablet Take 1 tablet (10 mg total) by mouth daily at 6 PM. 30 tablet 1  . B Complex Vitamins (B-COMPLEX/B-12) TABS Take 1 tablet by mouth daily.    . cephALEXin (KEFLEX) 500 MG capsule Take 1 capsule (500 mg total) by mouth 4 (four) times daily. 28 capsule 0  . Cholecalciferol (VITAMIN D PO) Take by mouth daily.    . clopidogrel (PLAVIX) 75 MG tablet Take 1 tablet (75 mg total) by mouth daily. 30 tablet 3  . HYDROcodone-acetaminophen (NORCO/VICODIN) 5-325 MG tablet Take 1-2 tablets by mouth every 6 (six) hours as needed. 6 tablet 0  . Multiple Vitamins-Minerals (CENTRUM MEN) TABS Take by mouth.    . propranolol (INDERAL) 20 MG tablet Take 1 tablet (20 mg total) by mouth 3 (three) times daily. 90 tablet 0  . ticagrelor (BRILINTA) 90 MG TABS tablet Take 1 tablet (90 mg total) by mouth 2 (two) times daily. 60 tablet 1   No current facility-administered medications on file prior to visit.    LABS/IMAGING: No results found for this or any previous visit (from the past 48 hour(s)). No results found.  LIPID PANEL:    Component Value Date/Time   CHOL 144 12/02/2017 0535   TRIG 246 (H) 12/02/2017 0535   TRIG 244 (H) 12/02/2017 0535   HDL 37 (L) 12/02/2017 0535   CHOLHDL 3.9 12/02/2017 0535   VLDL 49 (H) 12/02/2017 0535   LDLCALC 58 12/02/2017 0535     WEIGHTS: Wt Readings from Last 3 Encounters:  02/27/20 (!) 335 lb (152 kg)  04/05/19 (!) 318 lb (144.2 kg)  04/04/19 (!) 318 lb (144.2 kg)    VITALS: BP (!) 142/88   Pulse 82   Temp 97.7 F (36.5 C)   Ht 5\' 10"  (1.778 m)   Wt (!) 335 lb (152 kg)   SpO2 93%   BMI 48.07 kg/m   EXAM: General appearance: alert and no distress Neck: no carotid  bruit, no JVD and thyroid not enlarged, symmetric, no tenderness/mass/nodules Lungs: clear to auscultation bilaterally Heart: regular rate and rhythm, S1, S2 normal and systolic murmur: systolic ejection 3/6, crescendo at 2nd right intercostal space Abdomen: soft, non-tender; bowel sounds normal; no masses,  no organomegaly Extremities: extremities normal, atraumatic, no cyanosis or edema Pulses: 2+ and symmetric Skin: Skin color, texture, turgor normal. No rashes or lesions Neurologic: Grossly normal Psych: Pleasant  EKG: Normal sinus  rhythm at 82, minute voltage criteria for LVH-personally reviewed  ASSESSMENT: 1. 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 Stroke with cerebral stent (2019) 3. OSA on CPAP 4. Hypertension 5. Morbid obesity 6. Ascending aortic aneurysm -4.3 cm (2019)  PLAN: 1.   William Lowery he is overdue for follow-up.  He will need a repeat CT angiogram of the aorta to follow-up his aortic aneurysm.  He also had moderate aortic stenosis and will need a repeat echo.  Will get labs including a metabolic profile and fasting lipid profile.  Blood pressure reasonable today.  There is been significant weight gain and he will work on reducing that.  We will contact him with results and plan follow-up otherwise annually or sooner if necessary.  Pixie Casino, MD, [facility], [place] Director of the Advanced Lipid Disorders &  Cardiovascular Risk Reduction [facility] Diplomate of the American Board of Clinical Lipidology Attending Cardiologist  Direct Dial: [phone]  Fax: [phone]  Website:  [email or web address] 02/27/2020, 4:35 PM

## 2020-03-03 ENCOUNTER — Telehealth: Payer: Self-pay | Admitting: Student

## 2020-03-03 NOTE — Telephone Encounter (Signed)
NIR.  Received fax prescription refill request from pharmacy for Plavix. Faxed filled prescription to [id number] [phone]) at [phone]- Plavix 75 mg tablets, take one tablet by mouth once daily, dispense 90 tablets with 1 refill.   Bea Graff Jamaal Bernasconi, PA-C 03/03/2020, 10:12 AM

## 2020-03-10 ENCOUNTER — Telehealth: Payer: Self-pay | Admitting: Internal Medicine

## 2020-03-10 NOTE — Telephone Encounter (Signed)
Returned the call to the patient's wife, per the dpr. She stated that the patient had severe claustrophobia and would not be able to do the Ct of the Aorta even if it was the doughnut shaped.  She stated that he would need an open spaced test.

## 2020-03-10 NOTE — Telephone Encounter (Signed)
Patient's wife is calling in regards to CT scheduled for 03/23/20. She states she has concerns regarding the patient entering the tube during CT. Please call.

## 2020-03-10 NOTE — Telephone Encounter (Signed)
There is not an "open" CT - -only his chest goes in the machine, it is not close to his face. Ok to order 1 mg ativan on call for the procedure if he is willing to try.  Dr Lemmie Evens

## 2020-03-11 NOTE — Telephone Encounter (Signed)
The patient's wife has been made aware of the scan and will talk to her husband to see if he is willing to move forward with it. She will call the provider's nurse back with the decision.

## 2020-03-17 LAB — BASIC METABOLIC PANEL
BUN/Creatinine Ratio: 19 (ref 9–20)
BUN: 15 mg/dL (ref 6–24)
CO2: 28 mmol/L (ref 20–29)
Calcium: 9.2 mg/dL (ref 8.7–10.2)
Chloride: 103 mmol/L (ref 96–106)
Creatinine, Ser: 0.79 mg/dL (ref 0.76–1.27)
GFR calc Af Amer: 118 mL/min/{1.73_m2} (ref >59)
GFR calc non Af Amer: 102 mL/min/{1.73_m2} (ref >59)
Glucose: 93 mg/dL (ref 65–99)
Potassium: 4.4 mmol/L (ref 3.5–5.2)
Sodium: 143 mmol/L (ref 134–144)

## 2020-03-17 LAB — LIPID PANEL
Chol/HDL Ratio: 2.5 ratio (ref 0.0–5.0)
Cholesterol, Total: 136 mg/dL (ref 100–199)
HDL: 54 mg/dL (ref >39)
LDL Chol Calc (NIH): 64 mg/dL (ref 0–99)
Triglycerides: 99 mg/dL (ref 0–149)
VLDL Cholesterol Cal: 18 mg/dL (ref 5–40)

## 2020-03-23 ENCOUNTER — Other Ambulatory Visit: Payer: Self-pay

## 2020-03-23 ENCOUNTER — Ambulatory Visit (HOSPITAL_COMMUNITY): Payer: Self-pay | Attending: Cardiovascular Disease

## 2020-03-23 ENCOUNTER — Ambulatory Visit (INDEPENDENT_AMBULATORY_CARE_PROVIDER_SITE_OTHER)
Admission: RE | Admit: 2020-03-23 | Discharge: 2020-03-23 | Disposition: A | Discharge status: Home / Self Care | Payer: Self-pay | Source: Ambulatory Visit | Attending: Internal Medicine | Admitting: Internal Medicine

## 2020-03-23 DIAGNOSIS — I712 Thoracic aortic aneurysm, without rupture, unspecified: Secondary | ICD-10-CM

## 2020-03-23 DIAGNOSIS — I350 Nonrheumatic aortic (valve) stenosis: Secondary | ICD-10-CM

## 2020-03-23 MED ORDER — IOHEXOL 350 MG/ML SOLN
100.0000 mL | Freq: Once | INTRAVENOUS | Status: AC | PRN
  Administered 2020-03-23: 100 mL via INTRAVENOUS

## 2020-03-23 MED ORDER — PERFLUTREN LIPID MICROSPHERE
1.0000 mL | INTRAVENOUS | Status: AC | PRN
  Administered 2020-03-23: 3 mL via INTRAVENOUS

## 2020-03-24 ENCOUNTER — Other Ambulatory Visit: Payer: Self-pay | Admitting: *Deleted

## 2020-03-24 DIAGNOSIS — I350 Nonrheumatic aortic (valve) stenosis: Secondary | ICD-10-CM

## 2020-03-24 DIAGNOSIS — I712 Thoracic aortic aneurysm, without rupture, unspecified: Secondary | ICD-10-CM

## 2021-03-10 ENCOUNTER — Telehealth: Payer: Self-pay | Admitting: Internal Medicine

## 2021-03-10 NOTE — Telephone Encounter (Signed)
Left message for patient regarding the Monday 03/28/21 9:30 am CTA chest aorta appointment at [facility]--[address], [address]---arrival time is 9:15 am for check in---liquids only 4 hours prior to study-----will mail information to patient and it is available in My Chart---requested patient call with questions or concerns.

## 2021-03-11 ENCOUNTER — Other Ambulatory Visit: Payer: Self-pay | Admitting: Internal Medicine

## 2021-03-11 DIAGNOSIS — I712 Thoracic aortic aneurysm, without rupture, unspecified: Secondary | ICD-10-CM

## 2021-03-11 DIAGNOSIS — I10 Essential (primary) hypertension: Secondary | ICD-10-CM

## 2021-03-11 DIAGNOSIS — Z01812 Encounter for preprocedural laboratory examination: Secondary | ICD-10-CM

## 2021-03-28 ENCOUNTER — Inpatient Hospital Stay: Admission: RE | Admit: 2021-03-28 | Payer: Self-pay | Source: Ambulatory Visit

## 2021-05-13 LAB — BASIC METABOLIC PANEL
BUN/Creatinine Ratio: 14 (ref 9–20)
BUN: 12 mg/dL (ref 6–24)
CO2: 24 mmol/L (ref 20–29)
Calcium: 9 mg/dL (ref 8.7–10.2)
Chloride: 99 mmol/L (ref 96–106)
Creatinine, Ser: 0.83 mg/dL (ref 0.76–1.27)
Glucose: 105 mg/dL — ABNORMAL HIGH (ref 65–99)
Potassium: 3.8 mmol/L (ref 3.5–5.2)
Sodium: 141 mmol/L (ref 134–144)
eGFR: 103 mL/min/{1.73_m2} (ref >59)

## 2021-05-17 ENCOUNTER — Ambulatory Visit (INDEPENDENT_AMBULATORY_CARE_PROVIDER_SITE_OTHER)
Admission: RE | Admit: 2021-05-17 | Discharge: 2021-05-17 | Disposition: A | Discharge status: Home / Self Care | Payer: Self-pay | Source: Ambulatory Visit | Attending: Internal Medicine | Admitting: Internal Medicine

## 2021-05-17 ENCOUNTER — Other Ambulatory Visit: Payer: Self-pay

## 2021-05-17 DIAGNOSIS — I712 Thoracic aortic aneurysm, without rupture, unspecified: Secondary | ICD-10-CM

## 2021-05-17 DIAGNOSIS — I350 Nonrheumatic aortic (valve) stenosis: Secondary | ICD-10-CM

## 2021-05-17 MED ORDER — IOHEXOL 350 MG/ML SOLN
100.0000 mL | Freq: Once | INTRAVENOUS | Status: AC | PRN
  Administered 2021-05-17: 100 mL via INTRAVENOUS

## 2021-06-02 ENCOUNTER — Other Ambulatory Visit: Payer: Self-pay | Admitting: Internal Medicine

## 2021-06-02 DIAGNOSIS — I712 Thoracic aortic aneurysm, without rupture, unspecified: Secondary | ICD-10-CM

## 2021-07-14 NOTE — Telephone Encounter (Signed)
error 

## 2022-03-23 ENCOUNTER — Encounter: Payer: Self-pay | Admitting: Internal Medicine

## 2022-03-23 ENCOUNTER — Ambulatory Visit (INDEPENDENT_AMBULATORY_CARE_PROVIDER_SITE_OTHER): Payer: Self-pay | Admitting: Internal Medicine

## 2022-03-23 ENCOUNTER — Telehealth: Payer: Self-pay | Admitting: Licensed Clinical Social Worker

## 2022-03-23 VITALS — BP 131/84 | HR 70 | Ht 70.0 in | Wt 343.0 lb

## 2022-03-23 DIAGNOSIS — I10 Essential (primary) hypertension: Secondary | ICD-10-CM

## 2022-03-23 DIAGNOSIS — E782 Mixed hyperlipidemia: Secondary | ICD-10-CM

## 2022-03-23 DIAGNOSIS — I350 Nonrheumatic aortic (valve) stenosis: Secondary | ICD-10-CM

## 2022-03-23 DIAGNOSIS — I7121 Aneurysm of the ascending aorta, without rupture: Secondary | ICD-10-CM

## 2022-03-23 NOTE — Progress Notes (Signed)
OFFICE NOTE  Chief Complaint:  Follow-up  Primary Care Physician: William Pretty, MD  HPI:  William Lowery is a 56 y.o. male with a past medial history significant for attention, obstructive sleep apnea and recent presentation in January 2019 of right face, arm and leg numbness consistent with stroke.  He is a new patient to cardiology.  I had seen him in the hospital, but only to perform her procedure.  On presentation he had noted headache with right eye blurriness that resolved.  Afterwards he developed this significant numbness and presented for evaluation.  He was found to have acute stroke of the left posterior cerebral artery which was felt to be ischemic.  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  I felt the time it is feasible that he could have had some embolization of calcified material from this valve that led to his stroke.  He was seen by infectious disease, however felt that this mass was not likely vegetation.  Blood cultures were drawn however resulted in no growth.  He had no infective symptoms.  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  He does have a history of sleep apnea on CPAP but has been compliant with that for years.  While this may increase his risk of atrial fibrillation, there is no evidence that he has had any A. fib and I did not feel strongly about monitoring for it.  02/27/2020  William Lowery returns today for follow-up.  He still reports some residual fine motor loss in the right hand.  He says his handwriting is not as good as it could be.  He has been on low-dose propranolol to help with some tremor.  He was noted to have moderate aortic stenosis with some calcification of the aortic valve which needed  follow-up.  He is overdue for an echo.  In addition we did a CT angio of the aorta which confirmed a 4.3 cm ascending thoracic aortic aneurysm.  Follow-up was recommended annually but he is overdue for that.  He is also not had any recent labs.  He is seen by Dr. Shelia Media.  He denies any chest pain or worsening shortness of breath.  There has been some weight gain over the past 2 years which is significant up to 50 pounds.  03/23/2022  William Lowery returns today for follow-up.  Overall he says he is feeling well.  Denies any chest pain or worsening shortness of breath.  Blood pressure is well controlled.  Recent lipids in April showed good cholesterol control.  EKG today shows a normal sinus rhythm.  He set up for follow-up CT angio of the chest and aorta on July 12.  This is to reassess an ascending aortic aneurysm.  PMHx:  Past Medical History:  Diagnosis Date   Asthma    hx of   Heart murmur    as an infant   Hypertension    OSA (obstructive sleep apnea)    Sleep apnea    wears CPAP   Stroke [facility])     Past Surgical History:  Procedure Laterality Date   bellybutton procedure     as a newborn   IR ANGIO INTRA EXTRACRAN SEL COM CAROTID INNOMINATE BILAT MOD SED  12/01/2017   IR  ANGIO VERTEBRAL SEL VERTEBRAL UNI R MOD SED  12/01/2017   IR CT HEAD LTD  12/01/2017   IR INTRA CRAN STENT  12/01/2017   IR PERCUTANEOUS ART THROMBECTOMY/INFUSION INTRACRANIAL INC DIAG ANGIO  12/01/2017   IR RADIOLOGIST EVAL & MGMT  02/12/2018   IR RADIOLOGIST EVAL & MGMT  04/11/2018   RADIOLOGY WITH ANESTHESIA N/A 12/01/2017   Procedure: RADIOLOGY WITH ANESTHESIA CODE STROKE;  Surgeon: Radiologist, Medication, MD;  Location: [place];  Service: Radiology;  Laterality: N/A;   TEE WITHOUT CARDIOVERSION N/A 12/06/2017   Procedure: TRANSESOPHAGEAL ECHOCARDIOGRAM (TEE);  Surgeon: Pixie Casino, MD;  Location: [facility] ENDOSCOPY;  Service: Cardiovascular;  Laterality: N/A;    FAMHx:  Family History  Problem Relation Age of Onset    COPD Mother    Stroke Paternal Uncle    Colon cancer Neg Hx    Esophageal cancer Neg Hx    Rectal cancer Neg Hx    Stomach cancer Neg Hx     SOCHx:   reports that he has never smoked. He has quit using smokeless tobacco.  His smokeless tobacco use included snuff. He reports current alcohol use. He reports that he does not use drugs.  ALLERGIES:  Allergies  Allergen Reactions   Belviq [Lorcaserin]     Other reaction(s): DIZZINESS    ROS: Pertinent items noted in HPI and remainder of comprehensive ROS otherwise negative.  HOME MEDS: Current Outpatient Medications on File Prior to Visit  Medication Sig Dispense Refill   aspirin 325 MG tablet 1 tablet     atorvastatin (LIPITOR) 10 MG tablet Take 1 tablet (10 mg total) by mouth daily at 6 PM. 30 tablet 1   Cholecalciferol (VITAMIN D PO) Take by mouth daily.     clopidogrel (PLAVIX) 75 MG tablet Take 1 tablet (75 mg total) by mouth daily. 30 tablet 3   hydrochlorothiazide (MICROZIDE) 12.5 MG capsule Take 12.5 mg by mouth every morning.     Multiple Vitamins-Minerals (CENTRUM MEN) TABS Take by mouth.     olmesartan (BENICAR) 40 MG tablet Take 40 mg by mouth daily.     propranolol (INDERAL) 20 MG tablet Take 1 tablet (20 mg total) by mouth 3 (three) times daily. 90 tablet 0   No current facility-administered medications on file prior to visit.    LABS/IMAGING: No results found for this or any previous visit (from the past 48 hour(s)). No results found.  LIPID PANEL:    Component Value Date/Time   CHOL 136 03/17/2020 1117   TRIG 99 03/17/2020 1117   HDL 54 03/17/2020 1117   CHOLHDL 2.5 03/17/2020 1117   CHOLHDL 3.9 12/02/2017 0535   VLDL 49 (H) 12/02/2017 0535   LDLCALC 64 03/17/2020 1117     WEIGHTS: Wt Readings from Last 3 Encounters:  03/23/22 (!) 343 lb (155.6 kg)  02/27/20 (!) 335 lb (152 kg)  04/05/19 (!) 318 lb (144.2 kg)    VITALS: BP 131/84   Pulse 70   Ht '5\' 10"'$  (1.778 m)   Wt (!) 343 lb (155.6 kg)    SpO2 96%   BMI 49.22 kg/m   EXAM: General appearance: alert and no distress Neck: no carotid bruit, no JVD and thyroid not enlarged, symmetric, no tenderness/mass/nodules Lungs: clear to auscultation bilaterally Heart: regular rate and rhythm, S1, S2 normal and systolic murmur: systolic ejection 3/6, crescendo at 2nd right intercostal space Abdomen: soft, non-tender; bowel sounds normal; no masses,  no organomegaly Extremities: extremities normal, atraumatic, no cyanosis or  edema Pulses: 2+ and symmetric Skin: Skin color, texture, turgor normal. No rashes or lesions Neurologic: Grossly normal Psych: Pleasant  EKG: Normal sinus rhythm at 70, nonspecific T wave changes-personally reviewed  ASSESSMENT: Moderate aortic stenosis with calcified mass of the right coronary cusp Stroke with cerebral stent (2019) OSA on CPAP Hypertension Morbid obesity Ascending aortic aneurysm -4.3 cm (2019)  PLAN: 1.   Mr. Dowell says he continues to feel well.  He had at least moderate aortic stenosis based on his last echo and is overdue for repeat echo.  We will order that now.  On exam the murmur is late peaking and sounds moderate to severe.  He also is scheduled to have a repeat CT to assess his aortic aneurysm which was last measured at 4.3 cm.  I will follow-up with him on those results.  Blood pressure and cholesterol.  Well controlled.  He is compliant with CPAP.  Unfortunately weight continues to be an issue but he has plans to work on that.  Follow-up with me annually or sooner as necessary.  Pixie Casino, MD, [facility], [place] Director of the Advanced Lipid Disorders &  Cardiovascular Risk Reduction Clinic Diplomate of the American Board of Clinical Lipidology Attending Cardiologist  Direct Dial: [phone]  Fax: [phone]  Website:  [email or web address] 03/23/2022, 9:41 AM

## 2022-03-23 NOTE — Patient Instructions (Signed)
Medication Instructions:  Your physician recommends that you continue on your current medications as directed. Please refer to the Current Medication list given to you today.  *If you need a refill on your cardiac medications before your next appointment, please call your pharmacy*  Testing/Procedures: Your physician has requested that you have an echocardiogram. Echocardiography is a painless test that uses sound waves to create images of your heart. It provides your doctor with information about the size and shape of your heart and how well your heart's chambers and valves are working. This procedure takes approximately one hour. There are no restrictions for this procedure. -- done at [place] ([address]) or [employer] ([address] [place], [place] [address])   Follow-Up: At [facility], you and your health needs are our priority.  As part of our continuing mission to provide you with exceptional heart care, we have created designated Provider Care Teams.  These Care Teams include your primary Cardiologist (physician) and Advanced Practice Providers (APPs -  Physician Assistants and Nurse Practitioners) who all work together to provide you with the care you need, when you need it.  We recommend signing up for the patient portal called "MyChart".  Sign up information is provided on this After Visit Summary.  MyChart is used to connect with patients for Virtual Visits (Telemedicine).  Patients are able to view lab/test results, encounter notes, upcoming appointments, etc.  Non-urgent messages can be sent to your provider as well.   To learn more about what you can do with MyChart, go to [email or web address].    Your next appointment:   12 months with Dr. Debara Pickett  Provider:   Lyman Bishop MD

## 2022-03-24 NOTE — Progress Notes (Signed)
Heart and Vascular Care Navigation  03/24/2022  William Lowery 05-Dec-1965 [id number]  Reason for Referral:  No insurance Engaged with patient by telephone for initial visit for Heart and Vascular Care Coordination.                                                                                                   Assessment: LCSW completed brief assessment w/ pt via telephone at [phone]. Introduced self, role, and reason for call. Pt confirmed home address, PCP, and emergency contact. He lives with his wife, no minor children reported in the home. 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 LCSW shared that without knowing his income etc that he may be eligible for at least a partial discount to his medical bills with [employer] , likely not eligible for [employer]. Pt interested in any assistance and understands there may be limitations. Gave permission for me to mail the [employer]  and [employer] application to his home address. No additional concerns for transportation or other costs of living noted at this time.                                     HRT/VAS Care Coordination     Patients Home Cardiology Office [place] Team Social Worker   Lives with: Spouse   Patient Current Insurance Coverage Self-Pay   Patient Has Concern With Paying Medical Bills Yes   Patient Concerns With Medical Bills employed but no insurance   Medical Bill Referrals: CAFA   Does Patient Have Prescription Coverage? No   Patient Prescription Assistance Programs [place] [facility]   [place] [facility] Medications mailed application   Home Assistive Devices/Equipment None       Social History:                                                                             SDOH Screenings   Alcohol Screen: Not on file  Depression (PHQ2-9): Not on file  Financial Resource Strain: Low Risk    Difficulty of Paying Living Expenses: Not  very hard  Food Insecurity: No Food Insecurity   Worried About Running Out of Food in the Last Year: Never true   Ran Out of Food in the Last Year: Never true  Housing: Low Risk    Last Housing Risk Score: 0  Physical Activity: Not on file  Social Connections: Not on file  Stress: Not on file  Tobacco Use: Medium Risk   Smoking Tobacco Use: Never   Smokeless Tobacco Use: Former   Passive Exposure: Not on file  Transportation Needs: No Transportation Needs   Lack of Transportation (Medical): No   Lack of Transportation (Non-Medical):  No    SDOH Interventions: Financial Resources:  Financial Strain Interventions: Other (Comment) ([employer] and [employer] application sent) Financial Counseling for [employer] Program  Food Insecurity:  Food Insecurity Interventions: Intervention Not Indicated  Housing Insecurity:  Housing Interventions: Intervention Not Indicated  Transportation:   Transportation Interventions: Intervention Not Indicated    Other Care Navigation Interventions:     Provided Pharmacy assistance resources [place] [facility]   Follow-up plan:   Mailed my card, CAFA and [employer] application, pt aware I will be out of the office but if the applications arrive and he has questions within the next few weeks that my number will go to another SW on the team who can assist further.

## 2022-03-28 ENCOUNTER — Encounter: Payer: Self-pay | Admitting: Gastroenterology

## 2022-04-17 ENCOUNTER — Telehealth: Payer: Self-pay | Admitting: Licensed Clinical Social Worker

## 2022-04-17 NOTE — Telephone Encounter (Signed)
Pt contacted at [phone]. Able to connect w/ pt; re-introduced self, role, reason for call. Pt shares that his and his wife's income makes them ineligible for assistance applications sent. I encouraged him to retain them just in case he finds himself facing a high bill or emergency situation. Pt aware, and understands he can contact us as needed moving forward. I remain available as needed.   Westley Hummer, MSW, [place]  [phone]- work cell phone (preferred) [phone]- desk phone

## 2022-04-18 ENCOUNTER — Ambulatory Visit (HOSPITAL_COMMUNITY): Payer: Self-pay | Attending: Cardiology

## 2022-04-18 DIAGNOSIS — I350 Nonrheumatic aortic (valve) stenosis: Secondary | ICD-10-CM

## 2022-04-18 LAB — ECHOCARDIOGRAM COMPLETE
AR max vel: 1.82 cm2
AV Area VTI: 2.05 cm2
AV Area mean vel: 1.85 cm2
AV Mean grad: 26 mmHg
AV Peak grad: 45.8 mmHg
Ao pk vel: 3.38 m/s
Area-P 1/2: 3.44 cm2
S' Lateral: 2.5 cm

## 2022-04-19 ENCOUNTER — Other Ambulatory Visit: Payer: Self-pay | Admitting: Internal Medicine

## 2022-04-19 DIAGNOSIS — I7121 Aneurysm of the ascending aorta, without rupture: Secondary | ICD-10-CM

## 2022-04-19 DIAGNOSIS — I350 Nonrheumatic aortic (valve) stenosis: Secondary | ICD-10-CM

## 2022-05-15 ENCOUNTER — Encounter: Payer: Self-pay | Admitting: Internal Medicine

## 2022-05-17 ENCOUNTER — Inpatient Hospital Stay: Admission: RE | Admit: 2022-05-17 | Payer: Self-pay | Source: Ambulatory Visit

## 2022-10-27 ENCOUNTER — Encounter: Payer: Self-pay | Admitting: Internal Medicine

## 2022-10-27 ENCOUNTER — Ambulatory Visit: Payer: Self-pay | Attending: Internal Medicine | Admitting: Internal Medicine

## 2022-10-27 VITALS — BP 134/85 | HR 71 | Ht 70.0 in | Wt 338.8 lb

## 2022-10-27 DIAGNOSIS — I7121 Aneurysm of the ascending aorta, without rupture: Secondary | ICD-10-CM

## 2022-10-27 DIAGNOSIS — I10 Essential (primary) hypertension: Secondary | ICD-10-CM

## 2022-10-27 DIAGNOSIS — I350 Nonrheumatic aortic (valve) stenosis: Secondary | ICD-10-CM

## 2022-10-27 DIAGNOSIS — E782 Mixed hyperlipidemia: Secondary | ICD-10-CM

## 2022-10-27 MED ORDER — ASPIRIN 81 MG PO TBEC: 81.0000 mg | DELAYED_RELEASE_TABLET | Freq: Every day | ORAL | 12 refills | Status: AC

## 2022-10-27 NOTE — Patient Instructions (Signed)
Medication Instructions:  Your physician has recommended you make the following change in your medication:   -Decrease aspirin EC to '81mg'$  once daily.   *If you need a refill on your cardiac medications before your next appointment, please call your pharmacy*   Testing/Procedures: Your physician has requested that you have an echocardiogram. Echocardiography is a painless test that uses sound waves to create images of your heart. It provides your doctor with information about the size and shape of your heart and how well your heart's chambers and valves are working. This procedure takes approximately one hour. There are no restrictions for this procedure. Please do NOT wear cologne, perfume, aftershave, or lotions (deodorant is allowed). Please arrive 15 minutes prior to your appointment time. To do in June 2024. This procedure will be done at [address]   Follow-Up: At [facility], you and your health needs are our priority.  As part of our continuing mission to provide you with exceptional heart care, we have created designated Provider Care Teams.  These Care Teams include your primary Cardiologist (physician) and Advanced Practice Providers (APPs -  Physician Assistants and Nurse Practitioners) who all work together to provide you with the care you need, when you need it.  We recommend signing up for the patient portal called "MyChart".  Sign up information is provided on this After Visit Summary.  MyChart is used to connect with patients for Virtual Visits (Telemedicine).  Patients are able to view lab/test results, encounter notes, upcoming appointments, etc.  Non-urgent messages can be sent to your provider as well.   To learn more about what you can do with MyChart, go to [email or web address].    Your next appointment:   6 month(s)  The format for your next appointment:   In Person  Provider:   K. Mali Hilty, MD    Other Instructions Dr. Debara Pickett  recommends wearing compression socks.

## 2022-10-27 NOTE — Progress Notes (Signed)
OFFICE NOTE  Chief Complaint:  Follow-up  Primary Care Physician: Deland Pretty, MD  HPI:  William Lowery is a 56 y.o. male with a past medial history significant for attention, obstructive sleep apnea and recent presentation in January 2019 of right face, arm and leg numbness consistent with stroke.  He is a new patient to cardiology.  I had seen him in the hospital, but only to perform her procedure.  On presentation he had noted headache with right eye blurriness that resolved.  Afterwards he developed this significant numbness and presented for evaluation.  He was found to have acute stroke of the left posterior cerebral artery which was felt to be ischemic.  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  I felt the time it is feasible that he could have had some embolization of calcified material from this valve that led to his stroke.  He was seen by infectious disease, however felt that this mass was not likely vegetation.  Blood cultures were drawn however resulted in no growth.  He had no infective symptoms.  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  He does have a history of sleep apnea on CPAP but has been compliant with that for years.  While this may increase his risk of atrial fibrillation, there is no evidence that he has had any A. fib and I did not feel strongly about monitoring for it.  02/27/2020  [name] returns today for follow-up.  He still reports some residual fine motor loss in the right hand.  He says his handwriting is not as good as it could be.  He has been on low-dose propranolol to help with some tremor.  He was noted to have moderate aortic stenosis with some calcification of the aortic valve which needed  follow-up.  He is overdue for an echo.  In addition we did a CT angio of the aorta which confirmed a 4.3 cm ascending thoracic aortic aneurysm.  Follow-up was recommended annually but he is overdue for that.  He is also not had any recent labs.  He is seen by Dr. Shelia Media.  He denies any chest pain or worsening shortness of breath.  There has been some weight gain over the past 2 years which is significant up to 50 pounds.  03/23/2022  [name] returns today for follow-up.  Overall he says he is feeling well.  Denies any chest pain or worsening shortness of breath.  Blood pressure is well controlled.  Recent lipids in April showed good cholesterol control.  EKG today shows a normal sinus rhythm.  He set up for follow-up CT angio of the chest and aorta on July 12.  This is to reassess an ascending aortic aneurysm.  10/27/2022  [name] is seen today in follow-up.  Overall he says he is feeling pretty well.  He denies any chest pain or shortness of breath.  EKG today shows a normal sinus rhythm with nonspecific T wave changes.  He has been losing some weight recently started on Ozempic.  Blood pressure was reasonably well-controlled today.  His lipids in April showed an LDL that was at goal less than 70.  He has been maintained on aspirin and clopidogrel, but full dose aspirin.  This  is likely related to prior stenting and stroke as well as cardiovascular disease.  PMHx:  Past Medical History:  Diagnosis Date   Asthma    hx of   Heart murmur    as an infant   Hypertension    OSA (obstructive sleep apnea)    Sleep apnea    wears CPAP   Stroke [facility])     Past Surgical History:  Procedure Laterality Date   bellybutton procedure     as a newborn   IR ANGIO INTRA EXTRACRAN SEL COM CAROTID INNOMINATE BILAT MOD SED  12/01/2017   IR ANGIO VERTEBRAL SEL VERTEBRAL UNI R MOD SED  12/01/2017   IR CT HEAD LTD  12/01/2017   IR INTRA CRAN STENT  12/01/2017   IR PERCUTANEOUS ART THROMBECTOMY/INFUSION  INTRACRANIAL INC DIAG ANGIO  12/01/2017   IR RADIOLOGIST EVAL & MGMT  02/12/2018   IR RADIOLOGIST EVAL & MGMT  04/11/2018   RADIOLOGY WITH ANESTHESIA N/A 12/01/2017   Procedure: RADIOLOGY WITH ANESTHESIA CODE STROKE;  Surgeon: Radiologist, Medication, MD;  Location: [place];  Service: Radiology;  Laterality: N/A;   TEE WITHOUT CARDIOVERSION N/A 12/06/2017   Procedure: TRANSESOPHAGEAL ECHOCARDIOGRAM (TEE);  Surgeon: Pixie Casino, MD;  Location: [facility] ENDOSCOPY;  Service: Cardiovascular;  Laterality: N/A;    FAMHx:  Family History  Problem Relation Age of Onset   COPD Mother    Stroke Paternal Uncle    Colon cancer Neg Hx    Esophageal cancer Neg Hx    Rectal cancer Neg Hx    Stomach cancer Neg Hx     SOCHx:   reports that he has never smoked. He has quit using smokeless tobacco.  His smokeless tobacco use included snuff. He reports current alcohol use. He reports that he does not use drugs.  ALLERGIES:  Allergies  Allergen Reactions   Belviq [Lorcaserin]     Other reaction(s): DIZZINESS    ROS: Pertinent items noted in HPI and remainder of comprehensive ROS otherwise negative.  HOME MEDS: Current Outpatient Medications on File Prior to Visit  Medication Sig Dispense Refill   aspirin 325 MG tablet 1 tablet     atorvastatin (LIPITOR) 10 MG tablet Take 1 tablet (10 mg total) by mouth daily at 6 PM. 30 tablet 1   Cholecalciferol (VITAMIN D PO) Take by mouth daily.     clopidogrel (PLAVIX) 75 MG tablet Take 1 tablet (75 mg total) by mouth daily. 30 tablet 3   hydrochlorothiazide (MICROZIDE) 12.5 MG capsule Take 12.5 mg by mouth every morning.     Multiple Vitamins-Minerals (CENTRUM MEN) TABS Take by mouth.     olmesartan (BENICAR) 40 MG tablet Take 40 mg by mouth daily.     propranolol (INDERAL) 20 MG tablet Take 1 tablet (20 mg total) by mouth 3 (three) times daily. 90 tablet 0   Semaglutide,0.25 or 0.'5MG'$ /DOS, (OZEMPIC, 0.25 OR 0.5 MG/DOSE,) 2 MG/3ML SOPN 0.25 mg.     No current  facility-administered medications on file prior to visit.    LABS/IMAGING: No results found for this or any previous visit (from the past 48 hour(s)). No results found.  LIPID PANEL:    Component Value Date/Time   CHOL 136 03/17/2020 1117   TRIG 99 03/17/2020 1117   HDL 54 03/17/2020 1117   CHOLHDL 2.5 03/17/2020 1117   CHOLHDL 3.9 12/02/2017 0535   VLDL 49 (H) 12/02/2017 0535   LDLCALC 64 03/17/2020 1117     WEIGHTS: Wt Readings from Last 3  Encounters:  10/27/22 (!) 338 lb 12.8 oz (153.7 kg)  03/23/22 (!) 343 lb (155.6 kg)  02/27/20 (!) 335 lb (152 kg)    VITALS: BP 134/85 (BP Location: Left Arm, Patient Position: Sitting, Cuff Size: Large)   Pulse 71   Ht '5\' 10"'$  (1.778 m)   Wt (!) 338 lb 12.8 oz (153.7 kg)   SpO2 97%   BMI 48.61 kg/m   EXAM: General appearance: alert and no distress Neck: no carotid bruit, no JVD and thyroid not enlarged, symmetric, no tenderness/mass/nodules Lungs: clear to auscultation bilaterally Heart: regular rate and rhythm, S1, S2 normal and systolic murmur: systolic ejection 3/6, crescendo at 2nd right intercostal space Abdomen: soft, non-tender; bowel sounds normal; no masses,  no organomegaly Extremities: extremities normal, atraumatic, no cyanosis or edema Pulses: 2+ and symmetric Skin: Skin color, texture, turgor normal. No rashes or lesions Neurologic: Grossly normal Psych: Pleasant  EKG: Normal sinus rhythm at 71, nonspecific T wave changes-personally reviewed  ASSESSMENT: Moderate aortic stenosis with calcified mass of the right coronary cusp Stroke with cerebral stent (2019) OSA on CPAP Hypertension Morbid obesity Ascending aortic aneurysm -4.3 cm (2019), up to 4.6 cm (2023)  PLAN: 1.   Mr. Olesen is doing well and remains asymptomatic.  He is working on weight loss and now is on Ozempic.  I encouraged more physical activity and dietary changes which she is working on.  His last echo showed moderate aortic stenosis however  there was an increase in his aortic aneurysm up to 4.6 cm.  Not sure if this is an actual increase or just related to the measurements but we will repeat an echo next summer to reassess his aortic valve and ascending aorta.  He has some chronic venous insufficiency and should consider wearing compression stockings when on his feet a lot.  Follow-up with me in 6 months or sooner as necessary.  Pixie Casino, MD, [facility], [place] Director of the Advanced Lipid Disorders &  Cardiovascular Risk Reduction Clinic Diplomate of the American Board of Clinical Lipidology Attending Cardiologist  Direct Dial: [phone]  Fax: [phone]  Website:  [email or web address] 10/27/2022, 11:10 AM

## 2023-04-05 LAB — LAB REPORT - SCANNED
A1c: 6.2
EGFR: 104

## 2023-04-12 ENCOUNTER — Ambulatory Visit (HOSPITAL_COMMUNITY): Payer: Self-pay | Attending: Internal Medicine

## 2023-04-12 DIAGNOSIS — I7121 Aneurysm of the ascending aorta, without rupture: Secondary | ICD-10-CM | POA: Insufficient documentation

## 2023-04-12 DIAGNOSIS — I350 Nonrheumatic aortic (valve) stenosis: Secondary | ICD-10-CM | POA: Insufficient documentation

## 2023-04-12 LAB — ECHOCARDIOGRAM COMPLETE
AR max vel: 1.5 cm2
AV Area VTI: 1.62 cm2
AV Area mean vel: 1.49 cm2
AV Mean grad: 32 mmHg
AV Peak grad: 55.5 mmHg
Ao pk vel: 3.73 m/s
Area-P 1/2: 3.95 cm2
P 1/2 time: 563 msec
S' Lateral: 3.2 cm

## 2024-07-10 LAB — LAB REPORT - SCANNED: EGFR: 100

## 2024-09-15 ENCOUNTER — Ambulatory Visit: Payer: Self-pay | Attending: Internal Medicine | Admitting: Internal Medicine

## 2024-09-15 ENCOUNTER — Encounter: Payer: Self-pay | Admitting: Internal Medicine

## 2024-09-15 VITALS — BP 140/92 | HR 77 | Ht 70.0 in | Wt 351.0 lb

## 2024-09-15 DIAGNOSIS — I7121 Aneurysm of the ascending aorta, without rupture: Secondary | ICD-10-CM

## 2024-09-15 DIAGNOSIS — I350 Nonrheumatic aortic (valve) stenosis: Secondary | ICD-10-CM

## 2024-09-15 DIAGNOSIS — E782 Mixed hyperlipidemia: Secondary | ICD-10-CM

## 2024-09-15 DIAGNOSIS — I10 Essential (primary) hypertension: Secondary | ICD-10-CM

## 2024-09-15 NOTE — Patient Instructions (Signed)
 Medication Instructions:   NO CHANGES  *If you need a refill on your cardiac medications before your next appointment, please call your pharmacy*  Testing/Procedures:  Your physician has requested that you have an echocardiogram. Echocardiography is a painless test that uses sound waves to create images of your heart. It provides your doctor with information about the size and shape of your heart and how well your heart's chambers and valves are working. This procedure takes approximately one hour. There are no restrictions for this procedure. Please do NOT wear cologne, perfume, aftershave, or lotions (deodorant is allowed). Please arrive 15 minutes prior to your appointment time.  Please note: We ask at that you not bring children with you during ultrasound (echo/ vascular) testing. Due to room size and safety concerns, children are not allowed in the ultrasound rooms during exams. Our front office staff cannot provide observation of children in our lobby area while testing is being conducted. An adult accompanying a patient to their appointment will only be allowed in the ultrasound room at the discretion of the ultrasound technician under special circumstances. We apologize for any inconvenience.   Follow-Up: At [facility], you and your health needs are our priority.  As part of our continuing mission to provide you with exceptional heart care, our providers are all part of one team.  This team includes your primary Cardiologist (physician) and Advanced Practice Providers or APPs (Physician Assistants and Nurse Practitioners) who all work together to provide you with the care you need, when you need it.  Your next appointment:    6 months with Dr. Mona or NP/PA  We recommend signing up for the patient portal called MyChart.  Sign up information is provided on this After Visit Summary.  MyChart is used to connect with patients for Virtual Visits (Telemedicine).  Patients are  able to view lab/test results, encounter notes, upcoming appointments, etc.  Non-urgent messages can be sent to your provider as well.   To learn more about what you can do with MyChart, go to [email or web address].   Other Instructions

## 2024-09-15 NOTE — Progress Notes (Signed)
 OFFICE NOTE  Chief Complaint:  Follow-up  Primary Care Physician: Clarice Nottingham, MD  HPI:  William Lowery is a 58 y.o. male with a past medial history significant for attention, obstructive sleep apnea and recent presentation in January 2019 of right face, arm and leg numbness consistent with stroke.  He is a new patient to cardiology.  I had seen him in the hospital, but only to perform her procedure.  On presentation he had noted headache with right eye blurriness that resolved.  Afterwards he developed this significant numbness and presented for evaluation.  He was found to have acute stroke of the left posterior cerebral artery which was felt to be ischemic.  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  I felt the time it is feasible that he could have had some embolization of calcified material from this valve that led to his stroke.  He was seen by infectious disease, however felt that this mass was not likely vegetation.  Blood cultures were drawn however resulted in no growth.  He had no infective symptoms.  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  He does have a history of sleep apnea on CPAP but has been compliant with that for years.  While this may increase his risk of atrial fibrillation, there is no evidence that he has had any A. fib and I did not feel strongly about monitoring for it.  02/27/2020  William Lowery returns today for follow-up.  He still reports some residual fine motor loss in the right hand.  He says his handwriting is not as good as it could be.  He has been on low-dose propranolol  to help with some tremor.  He was noted to have moderate aortic stenosis with some calcification of the aortic valve which needed  follow-up.  He is overdue for an echo.  In addition we did a CT angio of the aorta which confirmed a 4.3 cm ascending thoracic aortic aneurysm.  Follow-up was recommended annually but he is overdue for that.  He is also not had any recent labs.  He is seen by Dr. Clarice.  He denies any chest pain or worsening shortness of breath.  There has been some weight gain over the past 2 years which is significant up to 50 pounds.  03/23/2022  William Lowery returns today for follow-up.  Overall he says he is feeling well.  Denies any chest pain or worsening shortness of breath.  Blood pressure is well controlled.  Recent lipids in April showed good cholesterol control.  EKG today shows a normal sinus rhythm.  He set up for follow-up CT angio of the chest and aorta on July 12.  This is to reassess an ascending aortic aneurysm.  10/27/2022  William Lowery is seen today in follow-up.  Overall he says he is feeling pretty well.  He denies any chest pain or shortness of breath.  EKG today shows a normal sinus rhythm with nonspecific T wave changes.  He has been losing some weight recently started on Ozempic.  Blood pressure was reasonably well-controlled today.  His lipids in April showed an LDL that was at goal less than 70.  He has been maintained on aspirin  and clopidogrel , but full dose aspirin .  This  is likely related to prior stenting and stroke as well as cardiovascular disease.  09/15/2024  William Lowery is seen today in follow-up.  He is a little overdue for follow-up.  His last echo was in June 2024.  This showed moderate to severe aortic stenosis with a mean gradient 32 mmHg.  He reports remaining asymptomatic however he is not very physically active.  He only gets about 2000 steps a day.  He denies any shortness of breath or chest pain.  Weight has continued to go up.  His weight now is 351.  He could not tolerate Ozempic due to some side effects and then the cost was too high.  Blood pressure is elevated today  150/106.  Recheck blood pressure was 140/92.  PMHx:  Past Medical History:  Diagnosis Date   Asthma    hx of   Heart murmur    as an infant   Hypertension    OSA (obstructive sleep apnea)    Sleep apnea    wears CPAP   Stroke [facility])     Past Surgical History:  Procedure Laterality Date   bellybutton procedure     as a newborn   IR ANGIO INTRA EXTRACRAN SEL COM CAROTID INNOMINATE BILAT MOD SED  12/01/2017   IR ANGIO VERTEBRAL SEL VERTEBRAL UNI R MOD SED  12/01/2017   IR CT HEAD LTD  12/01/2017   IR INTRA CRAN STENT  12/01/2017   IR PERCUTANEOUS ART THROMBECTOMY/INFUSION INTRACRANIAL INC DIAG ANGIO  12/01/2017   IR RADIOLOGIST EVAL & MGMT  02/12/2018   IR RADIOLOGIST EVAL & MGMT  04/11/2018   RADIOLOGY WITH ANESTHESIA N/A 12/01/2017   Procedure: RADIOLOGY WITH ANESTHESIA CODE STROKE;  Surgeon: Radiologist, Medication, MD;  Location: [facility] OR;  Service: Radiology;  Laterality: N/A;   TEE WITHOUT CARDIOVERSION N/A 12/06/2017   Procedure: TRANSESOPHAGEAL ECHOCARDIOGRAM (TEE);  Surgeon: Mona Vinie BROCKS, MD;  Location: [facility] ENDOSCOPY;  Service: Cardiovascular;  Laterality: N/A;    FAMHx:  Family History  Problem Relation Age of Onset   COPD Mother    Stroke Paternal Uncle    Colon cancer Neg Hx    Esophageal cancer Neg Hx    Rectal cancer Neg Hx    Stomach cancer Neg Hx     SOCHx:   reports that he has never smoked. He has quit using smokeless tobacco.  His smokeless tobacco use included snuff. He reports current alcohol use. He reports that he does not use drugs.  ALLERGIES:  Allergies  Allergen Reactions   Belviq [Lorcaserin]     Other reaction(s): DIZZINESS    ROS: Pertinent items noted in HPI and remainder of comprehensive ROS otherwise negative.  HOME MEDS: Current Outpatient Medications on File Prior to Visit  Medication Sig Dispense Refill   aspirin  EC 81 MG tablet Take 1 tablet (81 mg total) by mouth daily. Swallow whole. 30 tablet 12   atorvastatin  (LIPITOR) 10 MG  tablet Take 1 tablet (10 mg total) by mouth daily at 6 PM. 30 tablet 1   Cholecalciferol (VITAMIN D PO) Take by mouth daily.     clopidogrel  (PLAVIX ) 75 MG tablet Take 1 tablet (75 mg total) by mouth daily. 30 tablet 3   hydrochlorothiazide (MICROZIDE) 12.5 MG capsule Take 12.5 mg by mouth every morning.     Multiple Vitamins-Minerals (CENTRUM MEN) TABS Take by mouth.     olmesartan (BENICAR) 40 MG tablet Take 40 mg by mouth daily.     propranolol  (INDERAL ) 20 MG tablet  Take 1 tablet (20 mg total) by mouth 3 (three) times daily. 90 tablet 0   No current facility-administered medications on file prior to visit.    LABS/IMAGING: No results found for this or any previous visit (from the past 48 hours). No results found.  LIPID PANEL:    Component Value Date/Time   CHOL 136 03/17/2020 1117   TRIG 99 03/17/2020 1117   HDL 54 03/17/2020 1117   CHOLHDL 2.5 03/17/2020 1117   CHOLHDL 3.9 12/02/2017 0535   VLDL 49 (H) 12/02/2017 0535   LDLCALC 64 03/17/2020 1117     WEIGHTS: Wt Readings from Last 3 Encounters:  09/15/24 (!) 351 lb (159.2 kg)  10/27/22 (!) 338 lb 12.8 oz (153.7 kg)  03/23/22 (!) 343 lb (155.6 kg)    VITALS: BP (!) 140/92   Pulse 77   Ht 5' 10 (1.778 m)   Wt (!) 351 lb (159.2 kg)   SpO2 93%   BMI 50.36 kg/m   EXAM: General appearance: alert, no distress, and morbidly obese Neck: no carotid bruit, no JVD, and thyroid  not enlarged, symmetric, no tenderness/mass/nodules Lungs: clear to auscultation bilaterally Heart: regular rate and rhythm, S1: normal, S2: decreased intensity, and systolic murmur: late systolic 3/6, crescendo at 2nd right intercostal space Abdomen: Obese Extremities: venous stasis dermatitis noted and left lower extremity rubor Pulses: 2+ and symmetric Skin: Chronic lower extremity venous stasis changes Neurologic: Grossly normal Psych: Pleasant  EKG: EKG Interpretation Date/Time:  Monday September 15 2024 15:16:04 EST Ventricular Rate:   77 PR Interval:  158 QRS Duration:  76 QT Interval:  400 QTC Calculation: 452 R Axis:   1  Text Interpretation: Normal sinus rhythm T wave abnormality, consider lateral ischemia When compared with ECG of 01-Dec-2017 15:43, Inverted T waves have replaced nonspecific T wave abnormality in Lateral leads Confirmed by Mona Kent [phone]) on 09/15/2024 3:18:39 PM   ASSESSMENT: Moderate aortic stenosis with calcified mass of the right coronary cusp Stroke with cerebral stent (2019) OSA on CPAP Hypertension Morbid obesity Ascending aortic aneurysm -4.3 cm (2019), up to 4.6 cm (2023)  PLAN: 1.   William Lowery has probable severe aortic stenosis on exam today.  His last echo in 2024 showed mean gradient of 32 mmHg.  He has a presumed bicuspid aortic valve however the valve is heavily calcified.  There is mild aortic insufficiency.  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  We will need to repeat an echo now.  He seems to be asymptomatic however if there is severe aortic stenosis, would consider possible treadmill exercise stress test to see if he is symptomatic.  We could also check a BNP if he has severe aortic stenosis.  With regards to elevated blood pressure, recheck did come down to 140/92 although was difficult to obtain given his arm size.  I will not make adjustments on his medications today pending his upcoming echo. We also discussed weight loss today and dietary options for that.  He might also qualify for Zepbound given his obesity and sleep apnea however he is concerned based on side effects he had with Ozempic.  Follow-up with me in 6 months or sooner as necessary.  Kent KYM Mona, MD, [facility], FNLA, FACP  [facility]  Medical Director of the Advanced Lipid Disorders &  Cardiovascular Risk Reduction Clinic Diplomate of the American Board of Clinical Lipidology Attending Cardiologist  Direct Dial: [phone]  Fax: [phone]  Website:   [email or web address]  Vinie BROCKS Giles Currie 09/15/2024, 3:56 PM

## 2024-11-13 ENCOUNTER — Ambulatory Visit (HOSPITAL_COMMUNITY)
Admission: RE | Admit: 2024-11-13 | Discharge: 2024-11-13 | Disposition: A | Discharge status: Home / Self Care | Payer: Self-pay | Source: Ambulatory Visit | Attending: Cardiovascular Disease | Admitting: Cardiovascular Disease

## 2024-11-13 DIAGNOSIS — I359 Nonrheumatic aortic valve disorder, unspecified: Secondary | ICD-10-CM

## 2024-11-13 DIAGNOSIS — I350 Nonrheumatic aortic (valve) stenosis: Secondary | ICD-10-CM | POA: Insufficient documentation

## 2024-11-13 LAB — ECHOCARDIOGRAM COMPLETE
AR max vel: 1.27 cm2
AV Area VTI: 1.17 cm2
AV Area mean vel: 1.25 cm2
AV Mean grad: 26.3 mmHg
AV Peak grad: 43.1 mmHg
Ao pk vel: 3.28 m/s
Area-P 1/2: 5.54 cm2
S' Lateral: 3.84 cm

## 2024-11-13 MED ORDER — PERFLUTREN LIPID MICROSPHERE
1.0000 mL | INTRAVENOUS | Status: AC | PRN
  Administered 2024-11-13: 2 mL via INTRAVENOUS

## 2024-11-21 ENCOUNTER — Ambulatory Visit (HOSPITAL_BASED_OUTPATIENT_CLINIC_OR_DEPARTMENT_OTHER): Payer: Self-pay | Admitting: Internal Medicine

## 2024-11-21 DIAGNOSIS — I350 Nonrheumatic aortic (valve) stenosis: Secondary | ICD-10-CM
